# Patient Record
Sex: Male | Born: 1960 | Race: White | Hispanic: No | Marital: Married | Smoking: Former smoker
Health system: Southern US, Community
[De-identification: ages and names within clinical notes are randomized; demographics above are authoritative.]

## PROBLEM LIST (undated history)

## (undated) DIAGNOSIS — G5600 Carpal tunnel syndrome, unspecified upper limb: Secondary | ICD-10-CM

## (undated) DIAGNOSIS — K5790 Diverticulosis of intestine, part unspecified, without perforation or abscess without bleeding: Secondary | ICD-10-CM

## (undated) DIAGNOSIS — K5792 Diverticulitis of intestine, part unspecified, without perforation or abscess without bleeding: Secondary | ICD-10-CM

## (undated) DIAGNOSIS — T4145XA Adverse effect of unspecified anesthetic, initial encounter: Secondary | ICD-10-CM

## (undated) DIAGNOSIS — M4802 Spinal stenosis, cervical region: Secondary | ICD-10-CM

## (undated) DIAGNOSIS — K635 Polyp of colon: Secondary | ICD-10-CM

## (undated) DIAGNOSIS — F319 Bipolar disorder, unspecified: Secondary | ICD-10-CM

## (undated) DIAGNOSIS — G2581 Restless legs syndrome: Secondary | ICD-10-CM

## (undated) DIAGNOSIS — T8859XA Other complications of anesthesia, initial encounter: Secondary | ICD-10-CM

## (undated) DIAGNOSIS — R011 Cardiac murmur, unspecified: Secondary | ICD-10-CM

## (undated) DIAGNOSIS — M1990 Unspecified osteoarthritis, unspecified site: Secondary | ICD-10-CM

## (undated) DIAGNOSIS — I6529 Occlusion and stenosis of unspecified carotid artery: Secondary | ICD-10-CM

## (undated) DIAGNOSIS — E785 Hyperlipidemia, unspecified: Secondary | ICD-10-CM

## (undated) DIAGNOSIS — G4700 Insomnia, unspecified: Secondary | ICD-10-CM

## (undated) DIAGNOSIS — G4730 Sleep apnea, unspecified: Secondary | ICD-10-CM

## (undated) DIAGNOSIS — Z8719 Personal history of other diseases of the digestive system: Secondary | ICD-10-CM

## (undated) DIAGNOSIS — T7840XA Allergy, unspecified, initial encounter: Secondary | ICD-10-CM

## (undated) DIAGNOSIS — I493 Ventricular premature depolarization: Secondary | ICD-10-CM

## (undated) DIAGNOSIS — IMO0001 Reserved for inherently not codable concepts without codable children: Secondary | ICD-10-CM

## (undated) DIAGNOSIS — R200 Anesthesia of skin: Secondary | ICD-10-CM

## (undated) HISTORY — PX: SHOULDER SURGERY: SHX246

## (undated) HISTORY — DX: Hyperlipidemia, unspecified: E78.5

## (undated) HISTORY — DX: Polyp of colon: K63.5

## (undated) HISTORY — PX: OTHER SURGICAL HISTORY: SHX169

## (undated) HISTORY — DX: Carpal tunnel syndrome, unspecified upper limb: G56.00

## (undated) HISTORY — DX: Diverticulitis of intestine, part unspecified, without perforation or abscess without bleeding: K57.92

## (undated) HISTORY — DX: Unspecified osteoarthritis, unspecified site: M19.90

## (undated) HISTORY — DX: Cardiac murmur, unspecified: R01.1

## (undated) HISTORY — DX: Insomnia, unspecified: G47.00

## (undated) HISTORY — PX: COLON SURGERY: SHX602

## (undated) HISTORY — PX: JOINT REPLACEMENT: SHX530

## (undated) HISTORY — PX: COLONOSCOPY: SHX174

## (undated) HISTORY — DX: Reserved for inherently not codable concepts without codable children: IMO0001

---

## 1998-04-23 ENCOUNTER — Encounter: Payer: Self-pay | Admitting: Family Medicine

## 1998-04-23 ENCOUNTER — Ambulatory Visit (HOSPITAL_COMMUNITY): Admission: RE | Admit: 1998-04-23 | Discharge: 1998-04-23 | Payer: Self-pay | Admitting: Family Medicine

## 1999-01-13 ENCOUNTER — Emergency Department (HOSPITAL_COMMUNITY): Admission: EM | Admit: 1999-01-13 | Discharge: 1999-01-13 | Payer: Self-pay | Admitting: Emergency Medicine

## 1999-07-01 ENCOUNTER — Encounter: Payer: Self-pay | Admitting: Family Medicine

## 1999-07-01 ENCOUNTER — Ambulatory Visit (HOSPITAL_COMMUNITY): Admission: RE | Admit: 1999-07-01 | Discharge: 1999-07-01 | Payer: Self-pay | Admitting: Family Medicine

## 2002-04-29 ENCOUNTER — Emergency Department (HOSPITAL_COMMUNITY): Admission: EM | Admit: 2002-04-29 | Discharge: 2002-04-29 | Payer: Self-pay | Admitting: Emergency Medicine

## 2002-04-29 ENCOUNTER — Encounter: Payer: Self-pay | Admitting: Emergency Medicine

## 2002-05-03 ENCOUNTER — Inpatient Hospital Stay (HOSPITAL_COMMUNITY): Admission: EM | Admit: 2002-05-03 | Discharge: 2002-05-04 | Payer: Self-pay | Admitting: Emergency Medicine

## 2002-05-03 ENCOUNTER — Encounter: Payer: Self-pay | Admitting: Internal Medicine

## 2002-05-04 ENCOUNTER — Encounter: Payer: Self-pay | Admitting: Internal Medicine

## 2004-06-10 ENCOUNTER — Ambulatory Visit: Payer: Self-pay | Admitting: Internal Medicine

## 2004-06-28 ENCOUNTER — Ambulatory Visit: Payer: Self-pay | Admitting: Internal Medicine

## 2004-07-07 ENCOUNTER — Encounter: Payer: Self-pay | Admitting: Internal Medicine

## 2004-08-01 ENCOUNTER — Ambulatory Visit (HOSPITAL_BASED_OUTPATIENT_CLINIC_OR_DEPARTMENT_OTHER): Admission: RE | Admit: 2004-08-01 | Discharge: 2004-08-01 | Payer: Self-pay | Admitting: Internal Medicine

## 2004-08-01 ENCOUNTER — Encounter: Payer: Self-pay | Admitting: Internal Medicine

## 2004-08-06 ENCOUNTER — Ambulatory Visit: Payer: Self-pay | Admitting: Pulmonary Disease

## 2004-08-25 ENCOUNTER — Ambulatory Visit: Payer: Self-pay | Admitting: Internal Medicine

## 2004-09-20 ENCOUNTER — Ambulatory Visit: Payer: Self-pay | Admitting: Internal Medicine

## 2004-10-20 ENCOUNTER — Ambulatory Visit: Payer: Self-pay | Admitting: Internal Medicine

## 2004-10-28 ENCOUNTER — Ambulatory Visit: Payer: Self-pay | Admitting: Pulmonary Disease

## 2004-12-20 ENCOUNTER — Ambulatory Visit: Payer: Self-pay | Admitting: Internal Medicine

## 2005-06-29 ENCOUNTER — Ambulatory Visit: Payer: Self-pay | Admitting: Internal Medicine

## 2005-07-18 ENCOUNTER — Ambulatory Visit: Payer: Self-pay | Admitting: Internal Medicine

## 2005-12-20 ENCOUNTER — Ambulatory Visit: Payer: Self-pay | Admitting: Internal Medicine

## 2006-03-30 ENCOUNTER — Ambulatory Visit: Payer: Self-pay | Admitting: Internal Medicine

## 2006-03-30 LAB — CONVERTED CEMR LAB
AST: 20 units/L (ref 0–37)
Alkaline Phosphatase: 30 units/L — ABNORMAL LOW (ref 39–117)
Chloride: 106 meq/L (ref 96–112)
Cholesterol: 225 mg/dL (ref 0–200)
Creatinine, Ser: 1.3 mg/dL (ref 0.4–1.5)
Glomerular Filtration Rate, Af Am: 76 mL/min/{1.73_m2}
Glucose, Bld: 102 mg/dL — ABNORMAL HIGH (ref 70–99)
HDL: 34.7 mg/dL — ABNORMAL LOW (ref >39.0)
MCHC: 34.2 g/dL (ref 30.0–36.0)
MCV: 92.5 fL (ref 78.0–100.0)
PSA: 0.71 ng/mL (ref 0.10–4.00)
Sodium: 142 meq/L (ref 135–145)
Triglyceride fasting, serum: 118 mg/dL (ref 0–149)
VLDL: 24 mg/dL (ref 0–40)
WBC: 5.2 10*3/uL (ref 4.5–10.5)

## 2007-04-13 ENCOUNTER — Emergency Department (HOSPITAL_COMMUNITY): Admission: EM | Admit: 2007-04-13 | Discharge: 2007-04-13 | Payer: Self-pay | Admitting: Emergency Medicine

## 2007-04-16 ENCOUNTER — Ambulatory Visit: Payer: Self-pay | Admitting: Internal Medicine

## 2007-04-16 DIAGNOSIS — E785 Hyperlipidemia, unspecified: Secondary | ICD-10-CM

## 2007-05-04 ENCOUNTER — Ambulatory Visit: Payer: Self-pay | Admitting: Gastroenterology

## 2007-05-29 ENCOUNTER — Ambulatory Visit: Payer: Self-pay | Admitting: Gastroenterology

## 2007-05-29 ENCOUNTER — Encounter: Payer: Self-pay | Admitting: Internal Medicine

## 2007-05-29 ENCOUNTER — Encounter: Payer: Self-pay | Admitting: Gastroenterology

## 2007-07-07 DIAGNOSIS — F329 Major depressive disorder, single episode, unspecified: Secondary | ICD-10-CM | POA: Insufficient documentation

## 2007-10-15 ENCOUNTER — Telehealth (INDEPENDENT_AMBULATORY_CARE_PROVIDER_SITE_OTHER): Payer: Self-pay | Admitting: *Deleted

## 2007-10-16 ENCOUNTER — Ambulatory Visit: Payer: Self-pay | Admitting: Internal Medicine

## 2007-11-28 ENCOUNTER — Telehealth (INDEPENDENT_AMBULATORY_CARE_PROVIDER_SITE_OTHER): Payer: Self-pay | Admitting: *Deleted

## 2007-11-29 ENCOUNTER — Ambulatory Visit: Payer: Self-pay | Admitting: Internal Medicine

## 2007-11-29 DIAGNOSIS — Z862 Personal history of diseases of the blood and blood-forming organs and certain disorders involving the immune mechanism: Secondary | ICD-10-CM

## 2007-11-29 DIAGNOSIS — F319 Bipolar disorder, unspecified: Secondary | ICD-10-CM | POA: Insufficient documentation

## 2007-11-29 DIAGNOSIS — M1710 Unilateral primary osteoarthritis, unspecified knee: Secondary | ICD-10-CM | POA: Insufficient documentation

## 2007-11-29 DIAGNOSIS — IMO0002 Reserved for concepts with insufficient information to code with codable children: Secondary | ICD-10-CM

## 2007-11-29 DIAGNOSIS — Z8639 Personal history of other endocrine, nutritional and metabolic disease: Secondary | ICD-10-CM

## 2007-11-29 DIAGNOSIS — R229 Localized swelling, mass and lump, unspecified: Secondary | ICD-10-CM

## 2007-11-29 DIAGNOSIS — G4700 Insomnia, unspecified: Secondary | ICD-10-CM

## 2008-01-03 ENCOUNTER — Ambulatory Visit: Payer: Self-pay | Admitting: Internal Medicine

## 2008-01-07 LAB — CONVERTED CEMR LAB
AST: 26 units/L (ref 0–37)
BUN: 18 mg/dL (ref 6–23)
Basophils Absolute: 0 10*3/uL (ref 0.0–0.1)
Bilirubin, Direct: 0.1 mg/dL (ref 0.0–0.3)
CO2: 27 meq/L (ref 19–32)
Calcium: 9.1 mg/dL (ref 8.4–10.5)
Chloride: 105 meq/L (ref 96–112)
Creatinine, Ser: 1.2 mg/dL (ref 0.4–1.5)
Eosinophils Absolute: 0.1 10*3/uL (ref 0.0–0.7)
GFR calc non Af Amer: 69 mL/min
MCV: 93 fL (ref 78.0–100.0)
Monocytes Relative: 7.5 % (ref 3.0–12.0)
PSA: 0.83 ng/mL (ref 0.10–4.00)
Potassium: 4 meq/L (ref 3.5–5.1)
RBC: 4.75 M/uL (ref 4.22–5.81)
RDW: 12 % (ref 11.5–14.6)
Triglycerides: 147 mg/dL (ref 0–149)
VLDL: 29 mg/dL (ref 0–40)
WBC: 5.2 10*3/uL (ref 4.5–10.5)

## 2009-01-19 ENCOUNTER — Ambulatory Visit: Payer: Self-pay | Admitting: Internal Medicine

## 2009-01-19 ENCOUNTER — Ambulatory Visit: Payer: Self-pay | Admitting: Cardiology

## 2009-01-19 ENCOUNTER — Inpatient Hospital Stay (HOSPITAL_COMMUNITY): Admission: EM | Admit: 2009-01-19 | Discharge: 2009-01-20 | Payer: Self-pay | Admitting: Emergency Medicine

## 2009-01-20 ENCOUNTER — Encounter: Payer: Self-pay | Admitting: Cardiology

## 2009-01-23 ENCOUNTER — Telehealth: Payer: Self-pay | Admitting: Cardiology

## 2009-01-26 ENCOUNTER — Telehealth (INDEPENDENT_AMBULATORY_CARE_PROVIDER_SITE_OTHER): Payer: Self-pay | Admitting: *Deleted

## 2009-01-27 ENCOUNTER — Encounter: Payer: Self-pay | Admitting: Cardiovascular Disease

## 2009-01-27 ENCOUNTER — Ambulatory Visit: Payer: Self-pay

## 2009-01-29 ENCOUNTER — Telehealth: Payer: Self-pay | Admitting: Cardiology

## 2009-02-20 ENCOUNTER — Ambulatory Visit: Payer: Self-pay | Admitting: Internal Medicine

## 2009-02-20 DIAGNOSIS — E781 Pure hyperglyceridemia: Secondary | ICD-10-CM | POA: Insufficient documentation

## 2009-02-25 LAB — CONVERTED CEMR LAB
Direct LDL: 179 mg/dL
Total CHOL/HDL Ratio: 5
Triglycerides: 83 mg/dL (ref 0.0–149.0)

## 2009-05-22 ENCOUNTER — Ambulatory Visit: Payer: Self-pay | Admitting: Internal Medicine

## 2009-05-29 ENCOUNTER — Encounter (INDEPENDENT_AMBULATORY_CARE_PROVIDER_SITE_OTHER): Payer: Self-pay | Admitting: *Deleted

## 2009-05-29 LAB — CONVERTED CEMR LAB
AST: 37 units/L (ref 0–37)
Alkaline Phosphatase: 39 units/L (ref 39–117)
Bilirubin, Direct: 0.1 mg/dL (ref 0.0–0.3)

## 2010-01-09 ENCOUNTER — Emergency Department (HOSPITAL_COMMUNITY): Admission: EM | Admit: 2010-01-09 | Discharge: 2010-01-10 | Payer: Self-pay | Admitting: Emergency Medicine

## 2010-01-11 ENCOUNTER — Telehealth: Payer: Self-pay | Admitting: Gastroenterology

## 2010-01-12 ENCOUNTER — Ambulatory Visit: Payer: Self-pay | Admitting: Gastroenterology

## 2010-01-29 ENCOUNTER — Ambulatory Visit: Payer: Self-pay | Admitting: Internal Medicine

## 2010-02-05 LAB — CONVERTED CEMR LAB
ALT: 36 units/L (ref 0–53)
AST: 22 units/L (ref 0–37)
Albumin: 4.2 g/dL (ref 3.5–5.2)
Basophils Absolute: 0.1 10*3/uL (ref 0.0–0.1)
Calcium: 9.4 mg/dL (ref 8.4–10.5)
Chloride: 102 meq/L (ref 96–112)
Direct LDL: 176.6 mg/dL
Eosinophils Absolute: 0.1 10*3/uL (ref 0.0–0.7)
GFR calc non Af Amer: 88.94 mL/min (ref >60)
Glucose, Bld: 64 mg/dL — ABNORMAL LOW (ref 70–99)
HDL: 43.6 mg/dL (ref >39.00)
Lymphocytes Relative: 31 % (ref 12.0–46.0)
MCHC: 35.2 g/dL (ref 30.0–36.0)
Monocytes Absolute: 0.4 10*3/uL (ref 0.1–1.0)
Neutro Abs: 3.8 10*3/uL (ref 1.4–7.7)
Platelets: 231 10*3/uL (ref 150.0–400.0)
RBC: 4.65 M/uL (ref 4.22–5.81)
RDW: 13.2 % (ref 11.5–14.6)
Total Bilirubin: 0.9 mg/dL (ref 0.3–1.2)
Triglycerides: 153 mg/dL — ABNORMAL HIGH (ref 0.0–149.0)

## 2010-06-22 ENCOUNTER — Emergency Department (HOSPITAL_COMMUNITY)
Admission: EM | Admit: 2010-06-22 | Discharge: 2010-06-22 | Payer: Self-pay | Source: Home / Self Care | Admitting: Emergency Medicine

## 2010-06-22 ENCOUNTER — Telehealth: Payer: Self-pay | Admitting: Internal Medicine

## 2010-07-15 NOTE — Progress Notes (Signed)
Summary: triaeg   Phone Note Call from Patient Call back at [phone]   Caller: Spouse Robin Call For: Arlyce Dice Reason for Call: Talk to Nurse Summary of Call: Spouse would like to speak to nurse regarding her husband, states that he has fever and  lower left abd pain. Patient was in the ER this weekend and was told he needed to be seen this week with a his gi. Initial call taken by: Tawni Levy,  January 11, 2010 9:31 AM  Follow-up for Phone Call        Message left to call back.   Teryl Lucy RN  January 11, 2010 10:16 AM Wife ntfd. that pt. is to be seen tomorrow afrternoon by N.P as Dr.Amanada Philbrick is supervising dr. Follow-up by: Teryl Lucy RN,  January 11, 2010 10:30 AM

## 2010-07-15 NOTE — Assessment & Plan Note (Signed)
Summary: f/u er. for diverticulitis    History of Present Illness Primary GI MD: Melvia Heaps MD [facility] Primary Raelan Burgoon: Willow Ora, MD Requesting Matheau Orona: Willow Ora, MD Chief Complaint: Post ER diverticulitis flare up. Starting last Saturday morning pt had mild LLQ abd discomfort and by that night pt had severe LLQ abd pain. Pt also had a fever off and on of 100. Pt was started on ATB's and now has mild abd tenderness and a low grade fever this morning.  History of Present Illness:   Patient seen last in November 2008 for acute diverticulitis. He had been doing well except for occasional mild, intermittent LLQ pain. A few days go patient developed constipation followed by an escalation of LLQ discomfort into severe LLQ pain with associated fevers. Symptoms remniscent of diverticulitis in 2008. Went to ER, given Cipro and Flagyl. Feeling much better. Labs in ER were ine with WBC of 9.4, hemogloin 15.3. Acute abdomina series was negative. Yesterday fever down in 99 -100 degree range. Slept well.    GI Review of Systems    Reports abdominal pain.     Location of  Abdominal pain: LLQ.    Denies acid reflux, belching, bloating, chest pain, dysphagia with liquids, dysphagia with solids, heartburn, loss of appetite, nausea, vomiting, vomiting blood, weight loss, and  weight gain.      Reports constipation and  diverticulosis.     Denies anal fissure, black tarry stools, change in bowel habit, diarrhea, fecal incontinence, heme positive stool, hemorrhoids, irritable bowel syndrome, jaundice, light color stool, liver problems, rectal bleeding, and  rectal pain.    Current Medications (verified): 1)  Depakote Er 500 Mg  Tb24 (Divalproex Sodium) .... 3 By Mouth Qd 2)  Lamictal 100 Mg  Tabs (Lamotrigine) .... Qd 3)  Cipro 500 Mg Tabs (Ciprofloxacin Hcl) .... One Tablet By Mouth Two Times A Day 4)  Metronidazole 500 Mg Tabs (Metronidazole) .... One Tablet By Mouth Two Times A Day  Allergies  (verified): 1)  ! Hydrocodone  Past History:  Past Medical History: COLONIC POLYPS, ADENOMATOUS  & H/O DIVERTICULITIS HYPERLIPIDEMIA  Depression- Bipolar (Dr. Tomasa Rand) insomnia (Dr. Shelle Iron) heart murmur arthritis (rt knee) (dr zeminski) elevated liver enzymes Carpal Tunnel (dr sypher) CP 01-2009---Nl ECHO and stress test neg   Diverticulitis  Past Surgical History: Reviewed history from 11/29/2007 and no changes required. history of maxillofacial surgery history of right shoulder surgery twice due to a football injury (open surgery) right knee arthroscopy in 2007  Family History: Reviewed history from 11/29/2007 and no changes required. prostate Ca - PGF DM - no HTN - no Colon Ca - no MI - no F - ?SVT s/p ablation  Social History: Reviewed history from 05/22/2009 and no changes required. Married Never Smoked Alcohol use-yes, 1-2 beers a day 4 children Occupation: works in [employer] consultant) Exercise-- walking more recently  diet, has to eat out a lot   Review of Systems       The patient complains of fever.  The patient denies allergy/sinus, anemia, anxiety-new, arthritis/joint pain, back pain, blood in urine, breast changes/lumps, change in vision, confusion, cough, coughing up blood, depression-new, fainting, fatigue, headaches-new, hearing problems, heart murmur, heart rhythm changes, itching, menstrual pain, muscle pains/cramps, night sweats, nosebleeds, pregnancy symptoms, shortness of breath, skin rash, sleeping problems, sore throat, swelling of feet/legs, swollen lymph glands, thirst - excessive , urination - excessive , urination changes/pain, urine leakage, vision changes, and voice change.    Vital Signs:  Patient profile:  50 year old male Height:      69 inches Weight:      244.13 pounds BMI:     36.18 Temp:     98.2 degrees F oral Pulse rate:   70 / minute Pulse rhythm:   regular BP sitting:   116 / 68  (right arm) Cuff size:    regular  Vitals Entered By: Christie Nottingham CMA Duncan Dull) (January 12, 2010 1:55 PM)  Physical Exam  General:  Well developed, well nourished, no acute distress. Head:  Normocephalic and atraumatic. Eyes:  Conjunctiva pink, no icterus.  Neck:  no obvious masses  Lungs:  Clear throughout to auscultation. Heart:  Regular rate and rhythm; no murmurs, rubs,  or bruits. Abdomen:  Abdomen soft,  nondistended. Moderately tender in LLQ. No obvious masses or hepatomegaly.Normal bowel sounds.  Msk:  Symmetrical with no gross deformities. Normal posture. Extremities:  No palmar erythema, no edema.  Neurologic:  Alert and  oriented x4;  grossly normal neurologically. Skin:  Intact without significant lesions or rashes. Cervical Nodes:  No significant cervical adenopathy. Psych:  Alert and cooperative. Normal mood and affect.   Impression & Recommendations:  Problem # 1:  DIVERTICULITIS OF COLON (ICD-562.11) Assessment Deteriorated Suspect recurrent sigmoid diverticulitis. Labs in ER were unremarkable. Improving on Cipro / Flagyl received in ER but LLQ still quite tender. ER gave patient #7 days of antibiotics. Will add three addiional days of treatment. Low residue diet until antibiotics completed and feeling better.   Call office ASAP for worsening pain and / or fevers.   Problem # 2:  COLONIC POLYPS, ADENOMATOUS (ICD-211.3) Assessment: Comment Only Removal of two tubular adenomas in Dec 2008.  Problem # 3:  BIPOLAR DISORDER UNSPECIFIED (ICD-296.80) Assessment: Comment Only  Patient Instructions: 1)  We sent prescription for Cipro and Metronidazole, 500 MG take 1 tab twice daily for 3 days, both medications. 2)  Low fiber diet brochure given. Stay on this diet until you complete the medications. 3)  Call us if you develop a fever or do feel any better.  4)  Copy sent to :  Willow Ora, MD 5)  The medication list was reviewed and reconciled.  All changed / newly prescribed medications were  explained.  A complete medication list was provided to the patient / caregiver. Prescriptions: METRONIDAZOLE 500 MG TABS (METRONIDAZOLE) Take 1 tab twice daily x 3 days  #6 x 0   Entered by:   Lowry Ram NCMA   Authorized by:   Willette Cluster NP   Signed by:   Lowry Ram NCMA on 01/12/2010   Method used:   Electronically to        [facility]  [phone]* (retail)       [address] [place], [place]  [address]       Ph: [phone] or [phone]       Fax: [phone]   RxID:   [id number] CIPRO 500 MG TABS (CIPROFLOXACIN HCL) Take 1 tab twice daily x 3 days  #6 x 0   Entered by:   Lowry Ram NCMA   Authorized by:   Willette Cluster NP   Signed by:   Lowry Ram NCMA on 01/12/2010   Method used:   Electronically to        [facility]  [phone]* (retail)       [address] [place], [place]  [address]  Ph: [phone] or [phone]       Fax: [phone]   RxID:   [id number]
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
## 2010-07-15 NOTE — Assessment & Plan Note (Signed)
Summary: CPX/NS/KDC/rescd/cbs   Vital Signs:  Patient profile:   50 year old male Height:      69 inches Weight:      248 pounds Pulse rate:   79 / minute Pulse rhythm:   regular BP sitting:   128 / 84  (left arm) Cuff size:   regular  Vitals Entered By: Army Fossa CMA (January 29, 2010 1:24 PM) CC: CPX: fasting  Comments c/o being tired    History of Present Illness: complete physical exam His only complaint today is fatigue, he works long hours in front of the computer, by the time he comes back home he has no energy. See review of systems Recent episodes of diverticulitis, saw  GI, improving  Preventive Screening-Counseling & Management  Alcohol-Tobacco     Alcohol type: wine     Smoking Status: current     Cans of tobacco/week: yes  Caffeine-Diet-Exercise     Does Patient Exercise: no  Current Medications (verified): 1)  Depakote Er 500 Mg  Tb24 (Divalproex Sodium) .... 3 By Mouth Qd 2)  Lamictal 100 Mg  Tabs (Lamotrigine) .... Qd  Allergies: 1)  ! Hydrocodone  Past History:  Past Medical History: Reviewed history from 01/12/2010 and no changes required. COLONIC POLYPS, ADENOMATOUS  & H/O DIVERTICULITIS HYPERLIPIDEMIA  Depression- Bipolar (Dr. Tomasa Rand) insomnia (Dr. Shelle Iron) heart murmur arthritis (rt knee) (dr zeminski) elevated liver enzymes Carpal Tunnel (dr sypher) CP 01-2009---Nl ECHO and stress test neg   Diverticulitis  Past Surgical History: Reviewed history from 11/29/2007 and no changes required. 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 no orthopnea . Resp:  Denies cough, shortness of breath, and wheezing; ++ snoring , had sleep study , no apnea . GI:  Denies bloody stools, diarrhea, nausea, and vomiting. GU:  Denies dysuria; change in the color of the urine while on antibiotics for diverticulitis , ?blood . Psych:  Denies anxiety and depression; sees psych, no depression, mood flat . Endo:  no problems with erections No problems with libido.  Physical Exam  General:  alert, well-developed, and overweight-appearing.   Neck:  no masses, no thyromegaly, and normal carotid upstroke.   Lungs:  normal respiratory effort, no intercostal retractions, no accessory muscle use, and normal breath sounds.   Heart:  normal rate, regular rhythm, and no murmur.   Abdomen:  soft, no distention, no masses, no guarding, no rigidity, and no rebound tenderness.  slightly tender at the left lower quadrant Rectal:  No external abnormalities noted. Normal sphincter tone. No rectal masses or tenderness. Prostate:  Prostate gland firm and smooth, no enlargement, nodularity, tenderness, mass, asymmetry or induration. Extremities:  no lower extremity edema Psych:  Oriented X3, memory intact for recent and remote, normally interactive, good eye contact, not anxious appearing, and not depressed appearing.     Impression & Recommendations:  Problem # 1:  HEALTH SCREENING (ICD-V70.0) Td 2001 and today  Cscope 12-08,  tics and polyps   Complains of fatigue, ROS is essentially negative, will see how his labs came back fatigue related to medication?. Recommend to discuss with psychiatry  diet-exercise discussed   Orders: Venipuncture (01027) TLB-BMP (Basic Metabolic Panel-BMET) (80048-METABOL) TLB-CBC Platelet - w/Differential (85025-CBCD) TLB-Hepatic/Liver Function Pnl (80076-HEPATIC) TLB-Lipid Panel  (80061-LIPID) TLB-TSH (Thyroid Stimulating Hormone) (84443-TSH) TLB-PSA (Prostate Specific Antigen) (84153-PSA) TLB-B12 + Folate Pnl (25366_44034-V42/VZD) Specimen Handling (63875)  Problem # 2:  HYPERTRIGLYCERIDEMIA (ICD-272.1) due for labs His LFTs were slightly elevated, they got better after we discontinued TriCor.  Labs Reviewed: SGOT: 37 (05/22/2009)   SGPT: 72 (05/22/2009)   HDL:46.80 (02/20/2009), 36.2 (01/03/2008)  LDL:DEL (01/03/2008), DEL (03/30/2006)  Chol:230 (02/20/2009), 247 (01/03/2008)  Trig:83.0 (02/20/2009), 147 (01/03/2008)  Complete Medication List: 1)  Depakote Er 500 Mg Tb24 (Divalproex sodium) .... 3 by mouth qd 2)  Lamictal 100 Mg Tabs (Lamotrigine) .... Qd  Other Orders: Tdap => 44yrs IM (64332) Admin 1st Vaccine (95188)  Patient Instructions: 1)  Please schedule a follow-up appointment in 6 months to talk about fatigue   Risk Factors:  Tobacco use:  current    Smokeless:  Yes    Type:  wine Exercise:  no  Colonoscopy History:     Date of Last Colonoscopy:  06/14/2007    Results:  abnormal- 2 polpys removed per pt (1 being pre cancerious)     Immunizations Administered:  Tetanus Vaccine:    Vaccine Type: Tdap    Site: right deltoid    Mfr: GlaxoSmithKline    Dose: 0.5 ml    Route: IM    Given by: Army Fossa CMA    Exp. Date: 03/03/2012    Lot #: [id number]    Preventive Care Screening  Last Tetanus Booster:    Date:  01/29/2010    Results:  Tdap  Colonoscopy:    Date:  06/14/2007    Results:  abnormal- 2 polpys removed per pt (1 being pre cancerious)

## 2010-08-06 ENCOUNTER — Ambulatory Visit (INDEPENDENT_AMBULATORY_CARE_PROVIDER_SITE_OTHER): Payer: 59 | Admitting: Internal Medicine

## 2010-08-06 ENCOUNTER — Encounter: Payer: Self-pay | Admitting: Internal Medicine

## 2010-08-06 DIAGNOSIS — B351 Tinea unguium: Secondary | ICD-10-CM | POA: Insufficient documentation

## 2010-08-06 DIAGNOSIS — R229 Localized swelling, mass and lump, unspecified: Secondary | ICD-10-CM

## 2010-08-06 DIAGNOSIS — E781 Pure hyperglyceridemia: Secondary | ICD-10-CM

## 2010-08-07 ENCOUNTER — Encounter: Payer: Self-pay | Admitting: Internal Medicine
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
## 2010-08-28 LAB — URINE CULTURE: Colony Count: NO GROWTH

## 2010-08-28 LAB — DIFFERENTIAL
Basophils Absolute: 0.1 10*3/uL (ref 0.0–0.1)
Eosinophils Relative: 1 % (ref 0–5)
Lymphocytes Relative: 22 % (ref 12–46)
Lymphs Abs: 2.1 10*3/uL (ref 0.7–4.0)
Monocytes Absolute: 0.7 10*3/uL (ref 0.1–1.0)
Neutrophils Relative %: 69 % (ref 43–77)

## 2010-08-28 LAB — URINALYSIS, ROUTINE W REFLEX MICROSCOPIC
Ketones, ur: NEGATIVE mg/dL
Nitrite: NEGATIVE
Urobilinogen, UA: 0.2 mg/dL (ref 0.0–1.0)

## 2010-08-28 LAB — POCT I-STAT, CHEM 8
Calcium, Ion: 1.14 mmol/L (ref 1.12–1.32)
Creatinine, Ser: 1.1 mg/dL (ref 0.4–1.5)
Glucose, Bld: 81 mg/dL (ref 70–99)
Potassium: 3.7 mEq/L (ref 3.5–5.1)
Sodium: 139 mEq/L (ref 135–145)

## 2010-08-28 LAB — CBC
Hemoglobin: 15 g/dL (ref 13.0–17.0)
MCHC: 35.6 g/dL (ref 30.0–36.0)

## 2010-08-28 LAB — LACTIC ACID, PLASMA: Lactic Acid, Venous: 1.2 mmol/L (ref 0.5–2.2)

## 2010-09-10 LAB — CULTURE, FUNGUS WITHOUT SMEAR

## 2010-09-15 ENCOUNTER — Telehealth: Payer: Self-pay | Admitting: Internal Medicine

## 2010-09-15 NOTE — Telephone Encounter (Signed)
Advise patient: Culture did confirm onychomycoses. Recommend to start Lamisil 250 mg one daily for 12 weeks I checked the computer, there is no interaction between Lamisil and his current medications. He needs to come back in 4 weeks to check LFTs to be sure the medication is not affecting him. Please arrange. [facility]

## 2010-09-15 NOTE — Telephone Encounter (Signed)
Message left for patient to return my call.  

## 2010-09-16 MED ORDER — TERBINAFINE HCL 250 MG PO TABS: 250.0000 mg | ORAL_TABLET | Freq: Every day | ORAL | Status: DC

## 2010-09-16 NOTE — Telephone Encounter (Signed)
Called pts cell, VM full. Left message home number.

## 2010-09-16 NOTE — Telephone Encounter (Signed)
I spoke w/ pt he is aware, will call to set up labs.

## 2010-09-19 LAB — COMPREHENSIVE METABOLIC PANEL
BUN: 14 mg/dL (ref 6–23)
CO2: 26 mEq/L (ref 19–32)
Chloride: 103 mEq/L (ref 96–112)
Creatinine, Ser: 1.06 mg/dL (ref 0.4–1.5)
GFR calc non Af Amer: >60 mL/min (ref >60)
Total Bilirubin: 0.6 mg/dL (ref 0.3–1.2)

## 2010-09-19 LAB — DIFFERENTIAL
Basophils Absolute: 0 10*3/uL (ref 0.0–0.1)
Basophils Relative: 1 % (ref 0–1)
Eosinophils Absolute: 0.1 10*3/uL (ref 0.0–0.7)
Lymphocytes Relative: 37 % (ref 12–46)
Lymphs Abs: 1.8 10*3/uL (ref 0.7–4.0)
Monocytes Absolute: 0.3 10*3/uL (ref 0.1–1.0)

## 2010-09-19 LAB — HEMOGLOBIN A1C
Hgb A1c MFr Bld: 5.6 % (ref 4.6–6.1)
Mean Plasma Glucose: 114 mg/dL

## 2010-09-19 LAB — CBC
HCT: 42.7 % (ref 39.0–52.0)
MCHC: 34.9 g/dL (ref 30.0–36.0)
MCV: 91.3 fL (ref 78.0–100.0)
Platelets: 191 10*3/uL (ref 150–400)
RBC: 4.67 MIL/uL (ref 4.22–5.81)
WBC: 4.9 10*3/uL (ref 4.0–10.5)
WBC: 6.3 10*3/uL (ref 4.0–10.5)

## 2010-09-19 LAB — CK TOTAL AND CKMB (NOT AT ARMC)
CK, MB: 1.9 ng/mL (ref 0.3–4.0)
Relative Index: 1.5 (ref 0.0–2.5)
Total CK: 121 U/L (ref 7–232)
Total CK: 127 U/L (ref 7–232)

## 2010-09-19 LAB — LIPID PANEL
Cholesterol: 217 mg/dL — ABNORMAL HIGH (ref 0–200)
Triglycerides: 559 mg/dL — ABNORMAL HIGH (ref <150)

## 2010-09-19 LAB — BASIC METABOLIC PANEL
BUN: 12 mg/dL (ref 6–23)
CO2: 24 mEq/L (ref 19–32)
GFR calc Af Amer: >60 mL/min (ref >60)
GFR calc non Af Amer: >60 mL/min (ref >60)
Glucose, Bld: 106 mg/dL — ABNORMAL HIGH (ref 70–99)
Potassium: 4.1 mEq/L (ref 3.5–5.1)

## 2010-09-19 LAB — TROPONIN I: Troponin I: 0.01 ng/mL (ref 0.00–0.06)

## 2010-09-19 LAB — CARDIAC PANEL(CRET KIN+CKTOT+MB+TROPI)
CK, MB: 1.6 ng/mL (ref 0.3–4.0)
Relative Index: 1.4 (ref 0.0–2.5)
Troponin I: 0.02 ng/mL (ref 0.00–0.06)
Troponin I: 0.03 ng/mL (ref 0.00–0.06)

## 2010-09-19 LAB — BRAIN NATRIURETIC PEPTIDE: Pro B Natriuretic peptide (BNP): <30 pg/mL (ref 0.0–100.0)

## 2010-09-19 LAB — D-DIMER, QUANTITATIVE: D-Dimer, Quant: <0.22 ug/mL-FEU (ref 0.00–0.48)

## 2010-09-23 ENCOUNTER — Ambulatory Visit (HOSPITAL_BASED_OUTPATIENT_CLINIC_OR_DEPARTMENT_OTHER)
Admission: RE | Admit: 2010-09-23 | Discharge: 2010-09-23 | Disposition: A | Discharge status: Home / Self Care | Payer: 59 | Source: Ambulatory Visit | Attending: General Surgery | Admitting: General Surgery

## 2010-09-23 ENCOUNTER — Other Ambulatory Visit: Payer: Self-pay | Admitting: General Surgery

## 2010-09-23 DIAGNOSIS — D1739 Benign lipomatous neoplasm of skin and subcutaneous tissue of other sites: Secondary | ICD-10-CM | POA: Insufficient documentation

## 2010-09-28 NOTE — Op Note (Signed)
  NAME:  HILMAN, KISSLING NO.:  [id number]  MEDICAL RECORD NO.:  [id number]           PATIENT TYPE:  LOCATION:                                 FACILITY:  PHYSICIAN:  Almond Lint, MD       DATE OF BIRTH:  13-Oct-1960  DATE OF PROCEDURE:  09/23/2010 DATE OF DISCHARGE:                              OPERATIVE REPORT   PREOPERATIVE DIAGNOSIS:  Left lateral abdominal wall mass.  POSTOPERATIVE DIAGNOSIS:  Left lateral abdominal wall mass.  PROCEDURE PERFORMED:  Excision of subcutaneous abdominal wall mass 4 cm.  ANESTHESIA:  Local.  SURGEON:  Almond Lint, MD  ESTIMATED BLOOD LOSS:  Minimal.  COMPLICATIONS:  None known.  SPECIMEN:  Left lateral abdominal wall mass.  FINDINGS:  Fatty-appearing tumor of the left lateral abdominal wall 4 x 2.5 x 1.5 cm.  PROCEDURE:  Mr. Litzinger was identified in the holding area and taken to the operating room where he was placed supine on the operating room table.  His left lateral abdominal wall was prepped and draped in standard fashion.  The area over the mass was infiltrated with local anesthetic.  A transverse incision was made over the mass with a #15 blade going all the way through the dermis.  Skin hooks were used to elevate the skin and the mass was dissected around with scissors.  It was then grasped with an Allis and the posterior attachments were taken with scissors as well.  Once the primary mass was removed, there were several lobules seen that were also taken.  The area was then inspected for hemostasis which was performed with the cautery.  The skin was then reapproximated with interrupted 3-0 Vicryl deep dermal sutures and then with 4-0 Monocryl running subcuticular suture.  The wound was then cleaned, dried, and dressed with Dermabond.  The patient tolerated the procedure well.  Needle, sponge, and instrument counts were correct.     Almond Lint, MD    FB/MEDQ  D:  09/23/2010  T:  09/24/2010  Job:   [id number]  Electronically Signed by Almond Lint MD on 09/28/2010 09:41:48 PM

## 2010-10-15 ENCOUNTER — Other Ambulatory Visit: Payer: 59

## 2010-10-22 ENCOUNTER — Other Ambulatory Visit: Payer: 59

## 2010-10-26 NOTE — H&P (Signed)
NAME:  Harry Carroll, Harry Carroll NO.:  [id number]   MEDICAL RECORD NO.:  [id number]          PATIENT TYPE:  EMS   LOCATION:  ED                           FACILITY:  [facility]   PHYSICIAN:  Marca Ancona, MD      DATE OF BIRTH:  03-26-1961   DATE OF ADMISSION:  01/19/2009  DATE OF DISCHARGE:                              HISTORY & PHYSICAL   PRIMARY CARE PHYSICIAN:  Willow Ora, MD.   HISTORY OF PRESENT ILLNESS:  This is a 50 year old with a history of  hyperlipidemia and bipolar disorder, who presents with chest pain.  He  has noted chest pain for 3 months.  He has it almost every morning.  It  will be a shooting/tight pain in the center of his chest when he wakes  up.  It will last 1 to 2 hours then resolve and has been getting  steadily worse.  He also notes the same pain when he is upset or under  stress.  This morning, he had the same pain, but it seemed worse so he  came to the emergency department.  It has lingered and is present very  mildly now.  He has never had any pain with exertion.  He had similar  symptoms back in 2003 and had a negative Myoview at that time.  He does  not get any formal exercise; however, he does state that he is mildly  short of breath after he climbs a flight of steps.   ALLERGIES:  No known drug allergies.   MEDICATIONS:  Lamictal and Depakote.  He is unsure about the doses.  He  does not take aspirin at home.   PAST MEDICAL HISTORY:  1. Hyperlipidemia.  2. Bipolar disorder.  3. History of knee surgery.  4. Myoview in November 2003, no ischemia or infarction, EF of 64%.      This was done because of atypical chest pain.  5. Bilateral osteoarthritis in the knees.   SOCIAL HISTORY:  The patient lives with his wife.  He is a Scientific laboratory technician.  He does not smoke cigarettes, however, he had does chew  tobacco.  He drinks alcohol occasionally.   FAMILY HISTORY:  The patient's father had an EP ablation, sounds like it  was either atrial  flutter or AVNRT.  His siblings do not have any known  coronary disease and his mother did not have known coronary disease.   REVIEW OF SYSTEMS:  All systems were reviewed and were negative except  as noted in the History of Present Illness.   RADIOLOGY:  Chest x-ray is clear.  EKG shows normal sinus rhythm, left  axis deviation, left ventricular hypertrophy, and poor anterior R-wave  progression.   LABORATORY DATA:  White count 4.9, hematocrit 46.3, platelets 191.  Potassium 4.1, creatinine 1.10.  D-dimer negative.  Cardiac enzymes  negative x2.   PHYSICAL EXAMINATION:  VITAL SIGNS:  Temperature 98.2, pulse 88 and  regular, blood pressure 139/80, saturation 96% on room air.  GENERAL:  He is a well-developed male in no apparent distress.  HEENT:  Normocephalic.  NECK:  Supple without lymphadenopathy, thyromegaly, or thyroid nodule.  JVP appears to be maybe mildly elevated at about 8 to 9 cm of water.  ABDOMEN:  Soft and nontender, normal bowel sounds, and no  hepatosplenomegaly.  CARDIOVASCULAR:  Heart regular S1 S2, no S3 S4.  There is no murmur.  There are 2+ posterior tibial pulses bilaterally.  There is no edema.  There is no carotid bruit.  LUNGS:  Clear to auscultation bilaterally.  EXTREMITIES:  No clubbing or cyanosis.  MUSCULOSKELETAL:  Normal exam.  SKIN:  Normal exam.  NEUROLOGIC:  Alert and oriented x3, normal affect.   ASSESSMENT/PLAN:  This is a 50 year old with a history of  hyperlipidemia, who presents with atypical chest pain.  1. Chest pain.  The patient's chest pain is quite atypical.  His risk      factors include hyperlipidemia.  His first 2 sets cardiac enzymes      are negative, however, they have only been a couple of hours apart.      We will plan on having him on aspirin, checking lipids in the      morning, and the cycling cardiac enzymes.  We will follow his blood      pressure as it may be that he is hypertensive.  If his enzymes      return  positive, we will start him on heparin and do a left heart      catheterization.  If his enzymes are negative, we will plan on      doing an outpatient Myoview.  2. Volume status.  The jugular venous pressure seems to be mildly      elevated.  We will check a BNP and an echocardiogram.      Marca Ancona, MD  Electronically Signed     DM/MEDQ  D:  01/19/2009  T:  01/19/2009  Job:  [id number]   cc:   Willow Ora, MD  [phone] [address] [place], [place] [address]

## 2010-10-26 NOTE — Discharge Summary (Signed)
NAMECOBY, SHREWSBERRY NO.:  [id number]   MEDICAL RECORD NO.:  [id number]          PATIENT TYPE:  INP   LOCATION:  1441                         FACILITY:  [facility]   PHYSICIAN:  Marca Ancona, MD      DATE OF BIRTH:  06-04-1961   DATE OF ADMISSION:  01/19/2009  DATE OF DISCHARGE:  01/20/2009                               DISCHARGE SUMMARY   CARDIOLOGIST:  Dr. Shirlee Latch.   PRIMARY CARE Dorinne Graeff:  Dr. Drue Novel.   DISCHARGE DIAGNOSIS:  Chest pain.   SECONDARY DIAGNOSES:  1. Hypertriglyceridemia/hyperlipidemia.  2. History of bipolar disorder.  3. Status post knee surgery and bilateral osteoarthritis 1998.   ALLERGIES:  No known drug allergies.   PROCEDURES:  A 2-D echocardiogram performed January 20, 2009 revealing EF  of 60% mild LVH.  No regional wall motion abnormalities.   HISTORY OF PRESENT ILLNESS:  A 50 year old male without prior cardiac  history.  He has had intermittent chest discomfort over the past 3  months, mostly in the mornings.  Pain is described as shooting in type  pain in the center chest normally when wakes up, lasting 1-2 hours  resolving spontaneously.  On the morning of admission he had recurrent  chest discomfort but this time it was worse than usual.  [facility]ed to  the [facility] Department.  There ECG showed no acute changes  and point of care cardiac markers were negative.  Admitted for  evaluation.   HOSPITAL COURSE:  The patient subsequently ruled out for MI.  He had no  further chest pain.  The 2-D echocardiogram was performed this morning  showing normal LV function without regional wall motion abnormalities.  EF was measured at 60%.  We have noted elevated lipids and ALT.  Triglycerides with total cholesterol 217, triglycerides 559, HDL 25 and  LDL not calculated.  We will initiate Tricor therapy at time of  discharge.   DISCHARGE LABS:  Hemoglobin 14.9, hematocrit 42.7, WBC 6.3, platelets  190,000.  Sodium 137, potassium  3.8, chloride 103, CO2 26, BUN 14,  creatinine 1.06, glucose 163, total bilirubin 0.6, alkaline phosphatase  35, AST 30, ALT 52, total protein 5.9, albumin 3.4, calcium 0.9,  hemoglobin A1c 5.5, CK 88, MB 1.7, troponin I 0.02, total cholesterol  270, triglycerides 559, HDL 25, LDL not calculated.   DISPOSITION:  The patient is discharged home today in good condition.   FOLLOW UP APPOINTMENTS:  We have arranged for follow up outpatient  exercise Myoview in our office on Tuesday August 17 at 9 a.m.  He is to  follow with Dr. Drue Novel within the next 2 weeks.   DISCHARGE MEDICATIONS:  1. Aspirin 81 mg daily.  2. Tricor 145 mg daily.  3. Lamictal.  4. Depakote as previously prescribed.  Patient unsure of home doses.   STUDIES:  None.   DURATION DISCHARGE ENCOUNTER:  35 minutes including physician time.      Nicolasa Ducking, ANP      Marca Ancona, MD  Electronically Signed    CB/MEDQ  D:  01/20/2009  T:  01/20/2009  Job:  [id number]   cc:   Willow Ora, MD  [phone] [address] [place], [place] [address]

## 2010-10-26 NOTE — Assessment & Plan Note (Signed)
[facility]                         GASTROENTEROLOGY OFFICE NOTE   BETH, SPACKMAN                       MRN:          [id number]  DATE:05/04/2007                            DOB:          08-26-1960    REASON FOR CONSULTATION:  Diverticulitis.   REASON:  Mr. Rockers is a pleasant 50 year old white male referred through  the courtesy of Dr. Drue Novel for evaluation.  Approximately 3 weeks ago he  developed severe right lower quadrant pain.  CT scan demonstrated  mesenteric stranding and inflammation in the area of the sigmoid colon  consistent with acute diverticulitis.  He was treated with antibiotics  with resolution of his pain.  He had a similar episode of pain in the  left lower quadrant approximately 10 years ago also diagnosed as  diverticulitis.  He generally moves his bowels regularly.  There is no  history of melena or hematochezia.   PAST MEDICAL HISTORY:  Pertinent for arthritis and depression.   FAMILY HISTORY:  Noncontributory.   MEDICATIONS:  Depakote, Lamictal and Celebrex.   HE IS ALLERGIC TO HYDROCODONE (ITCHING).   He does not smoke or drink but he does occasionally chew tobacco.  He is  married and works with Production manager.   REVIEW OF SYSTEMS:  Positive for urinary frequency and joint pain and  fatigue.   PHYSICAL EXAMINATION:  Pulse 80, blood pressure 106/64, weight 251.  HEENT:  EOMI.  PERRLA.  Sclerae are anicteric.  Conjunctivae are pink.  NECK:  Supple without thyromegaly, adenopathy or carotid bruits.  CHEST:  Clear to auscultation and percussion without adventitious  sounds.  CARDIAC:  Regular rhythm; normal S1 S2.  There are no murmurs, gallops  or rubs.  ABDOMEN:  Bowel sounds are normoactive.  Abdomen is soft, nontender and  nondistended.  There are no abdominal masses, tenderness, splenic  enlargement or hepatomegaly.  EXTREMITIES:  Full range of motion.  No cyanosis, clubbing or edema.  RECTAL:   Deferred.   IMPRESSION:  History of acute diverticulitis - clinically resolved.   RECOMMENDATION:  Colonoscopy at some point in the near future to help  determine severity and extent of his diverticular disease and for  colorectal cancer screening.     Barbette Hair. Arlyce Dice, MD,FACG  Electronically Signed    RDK/MedQ  DD: 05/04/2007  DT: 05/04/2007  Job #: [id number]   cc:   Willow Ora, MD

## 2010-10-26 NOTE — Letter (Signed)
May 04, 2007    Willow Ora, MD  [phone] [place], [place] [address]   RE:  Harry Carroll, Harry Carroll  MRN:  [id number]  /  DOB:  [birth year]   Dear Dr. Drue Novel:   Upon your kind referral, I had the pleasure of evaluating your patient  and I am pleased to offer my findings.  I saw Mr. Magallon in the office  today.  Enclosed is a copy of my progress note that details my findings  and recommendations.   Thank you for the opportunity to participate in your patient's care.    Sincerely,      Barbette Hair. Arlyce Dice, MD,FACG  Electronically Signed    RDK/MedQ  DD: 05/04/2007  DT: 05/04/2007  Job #: [id number]

## 2010-10-26 NOTE — Letter (Signed)
May 04, 2007    Harry Carroll   RE:  [name], [name]  MRN:  [id number]  /  DOB:  12/27/1960   Dear [name]:   It is my pleasure to have treated you recently as a new patient in my  office.  I appreciate your confidence and the opportunity to participate  in your care.   Since I do have a busy inpatient endoscopy schedule and office schedule,  my office hours vary weekly.  I am, however, available for emergency  calls every day through my office.  If I cannot promptly meet an urgent  office appointment, another one of our gastroenterologists will be able  to assist you.   My well-trained staff are prepared to help you at all times.  For  emergencies after office hours, a physician from our gastroenterology  section is always available through my 24-hour answering service.   While you are under my care, I encourage discussion of your questions  and concerns, and I will be happy to return your calls as soon as I am  available.   Once again, I welcome you as a new patient and I look forward to a happy  and healthy relationship.    Sincerely,      Barbette Hair. Arlyce Dice, MD,FACG  Electronically Signed   RDK/MedQ  DD: 05/04/2007  DT: 05/04/2007  Job #: [id number]

## 2010-10-28 ENCOUNTER — Other Ambulatory Visit (INDEPENDENT_AMBULATORY_CARE_PROVIDER_SITE_OTHER): Payer: 59

## 2010-10-28 DIAGNOSIS — B351 Tinea unguium: Secondary | ICD-10-CM

## 2010-10-28 LAB — HEPATIC FUNCTION PANEL
ALT: 39 U/L (ref 0–53)
AST: 25 U/L (ref 0–37)
Alkaline Phosphatase: 30 U/L — ABNORMAL LOW (ref 39–117)
Bilirubin, Direct: 0.1 mg/dL (ref 0.0–0.3)
Total Protein: 6.6 g/dL (ref 6.0–8.3)

## 2010-10-29 NOTE — Procedures (Signed)
NAME:  GREYSON, PEAVY NO.:  [id number]   MEDICAL RECORD NO.:  [id number]          PATIENT TYPE:  OUT   LOCATION:  SLEEP CENTER                 FACILITY:  [facility]   PHYSICIAN:  Marcelyn Bruins, M.D. [facility] DATE OF BIRTH:  18-Dec-1960   DATE OF STUDY:  08/01/2004                              NOCTURNAL POLYSOMNOGRAM   DATE OF STUDY:  August 01, 2004   REFERRING PHYSICIAN:  Dr. Willow Ora   INDICATION FOR STUDY:  Hypersomnia with sleep apnea. Epworth score is 17.   SLEEP ARCHITECTURE:  The patient had a total sleep time of 382 minutes with  sleep efficiency of 81%, there was decreased REM and slow wave sleep. Sleep  onset latency was prolonged at 40  minutes and REM latency was normal.   IMPRESSION:  1.  Small numbers of obstructive events which do not meet the respiratory      disturbance index criteria for the obstructive sleep apnea syndrome. The      patient had minimal oxygen desaturation to 89% during the small numbers      of events.  2.  Moderate snoring noted throughout the study.  3.  No clinically significant cardiac arrhythmias.  4.  Very large numbers of leg jerks with significant sleep disruption.      Clinical correlation is suggested. I suspect this is the etiology for      his sleep disruption and daytime symptoms.      KC/MEDQ  D:  08/06/2004 12:07:40  T:  08/07/2004 18:12:45  Job:  [id number]

## 2010-10-29 NOTE — H&P (Signed)
NAME:  Harry Carroll, Harry Carroll                          ACCOUNT NO.:  [id number]   MEDICAL RECORD NO.:  [id number]                   PATIENT TYPE:  INP   LOCATION:  3702                                 FACILITY:  [facility]   PHYSICIAN:  Salvadore Farber, M.D. [facility]         DATE OF BIRTH:  05/09/1961   DATE OF ADMISSION:  05/03/2002  DATE OF DISCHARGE:                                HISTORY & PHYSICAL   CHIEF COMPLAINT:  Chest pain.   HISTORY OF PRESENT ILLNESS:  The patient is a 50 year old gentleman without  history of coronary artery disease who presents with chest pain.  Over the  past three weeks, he has been under a great deal of stress at work.  Over  that same time, he has had waxing and waning chest discomfort.  Sometimes  this is as severe as 9 out of 10.  Sometimes it is associated with shortness  of breath.  There is no exertional component to his chest component.   Wednesday evening, he had the very abrupt onset of severe substernal chest  pain which knocked me to the floor.  He was seen at [facility]  Emergency Room where electrocardiogram was reportedly normal and troponin  was normal.  He was discharged to home.   Over the subsequent two and a half days, he has had near continual chest  discomfort sometimes worse than others but never resolved.  He says he has  had more than 20 episodes this week but no clear resolution.  He was seen by  Dr. Titus Dubin. Alwyn Ren today who referred him to the hospital for admission.   PAST MEDICAL HISTORY:  1. Dyslipidemia.  2. Diverticulitis.  3. Status post jaw surgery.  4. Status post shoulder surgery on his right shoulder x 2.   ALLERGIES:  No known drug allergies.   MEDICATIONS:  None.   SOCIAL HISTORY:  Lives in [place] with his wife.  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  Describes a great deal of stress on current work  project.  Uses one can of snuff every couple of days.  Occasional alcohol  use.   FAMILY  HISTORY:  His mother and father are both alive at 61 without coronary  disease.   REVIEW OF SYMPTOMS:  Remarkable only for arthralgias.  Otherwise is negative  in detail except as above.   PHYSICAL EXAMINATION:  GENERAL:  He is a well-appearing man in no distress.  VITAL SIGNS:  Pulse 80, blood pressure 134/60, respiratory rate 16,  temperature 99.0.  Oxygen saturation 97% on room air.  NECK:  He has no jugular venous distention.  LUNGS:  Clear to auscultation and percussion bilaterally.  HEART:  He has a nondisplaced point of maximal cardiac impulse.  He has a  regular rate and rhythm without murmur, rub, S3, or S4.  ABDOMEN:  Moderately obese, soft, nondistended, and nontender.  There is no  hepatosplenomegaly.  Bowel sounds are normal.  There is no pulsatile mass.  EXTREMITIES:  Warm without clubbing, cyanosis, or edema.  PULSES:  Carotid pulses are 2+ bilaterally without bruits.  Radial pulses  are 2+ bilaterally.  Femoral pulses are 2+ bilaterally without bruits.  Dorsalis pedis pulses are 2+ bilaterally.   LABORATORY DATA:  Electrocardiogram demonstrates normal sinus rhythm with  left axis deviation.  There is no evidence of ischemia.   Hematocrit 45, platelets 221,000, wbc's 7.  Potassium 3.8, creatinine 1.1,  glucose 87, CK 117, MB 1.4, troponin 0.01.  Lipase 24, amylase 52.   IMPRESSION:  This is a 50 year old gentleman with risk factors only of  possible dyslipidemia who presents with chest pain which is very atypical  for myocardial ischemia.  Further, electrocardiogram has no ischemic changes  and troponin is negative after longstanding pain.  Based on this  constellation of findings, there is a very low likelihood of coronary artery  disease.   The abrupt onset of severe pain Wednesday evening with waxing and waning  since then raises some concern for aortic dissection.  That diagnosis does  not, however, readily explain the symptoms that he has had over the past   several weeks.  Nonetheless, I think it important to exclude.  I think  anxiety with somatization is the most likely etiology of his discomfort.   PLAN:  We will plan on cycling serial enzymes to exclude myocardial  infarction.  We will continue aspirin.  We will avoid heparin given the low  likelihood of myocardial ischemia and possible diagnosis of aortic  dissection.  We will obtain CT angiogram to exclude aortic dissection.  If  this is normal, we will obtain EGG Cardiolite in the morning to exclude  myocardial ischemia.                                               Salvadore Farber, M.D. [facility]    WED/MEDQ  D:  05/03/2002  T:  05/04/2002  Job:  [id number]   cc:   Titus Dubin. Alwyn Ren, M.D. [facility]

## 2010-10-29 NOTE — H&P (Signed)
NAME:  Harry Carroll, Harry Carroll                          ACCOUNT NO.:  [id number]   MEDICAL RECORD NO.:  [id number]                   PATIENT TYPE:  INP   LOCATION:  3702                                 FACILITY:  [facility]   PHYSICIAN:  Titus Dubin. Alwyn Ren, M.D. [facility]         DATE OF BIRTH:  [birth year]   DATE OF ADMISSION:  05/03/2002  DATE OF DISCHARGE:                                HISTORY & PHYSICAL   HISTORY OF PRESENT ILLNESS:  The patient is a 50 year old white male  admitted with persistent intractable chest pain.   He has had chest pain intermittently over the last several weeks, but it has  become more progressive.  He was seen in the emergency room on April 29, 2002, for the chest pain and work-up was negative.  His pain has been so  severe since that office visit that he actually dropped to the floor the  evening of May 01, 2002.  He has had pain which has been essentially  continuous for several days.  He describes the pain as a palm sized area  of discomfort substernally with radiation laterally to each side.  He has  had no nausea or diaphoresis.  He has had no triggers for the chest pain and  there are no definite relieving factors.   In addition, he has had headache which was described as diffuse, bilateral,  sharp, and lasting minutes.  He has had no flushing or diarrhea associated  with the headache and chest pain.   He does admit to marked increase in stress at work recently.  Apparently his  supervisors requested that he take some time off.  He describes emotional  lability in the work environment.   PAST SURGICAL HISTORY:  1. Maxillofacial surgery in 1983 and 1984.  2. Right shoulder surgery on two occasions for injuries related to football.   PAST MEDICAL HISTORY:  1. In 1995, he was hospitalized with chest pain with a negative work-up.  2. Diverticulosis.  3. Degenerative arthritis.  4. Hyperlipidemia.  5. Elevated liver enzymes.   FAMILY HISTORY:   Prostate cancer in paternal grandfather.  There is no  family history of diabetes, hypertension, heart attack, or stroke.   SOCIAL HISTORY:  He has never smoked.  He has two to three alcoholic  beverages per week.  He does not exercise regular at this time.  He had been  using a treadmill up to a half of an hour a day with a speed of 3.8 miles  per hour with variable incline when seen for a physical in June.  He does  commute, working in [place], [place].   MEDICATIONS:  He is on no medications.   ALLERGIES:  He has no known drug allergies.   REVIEW OF SYMPTOMS:  Positive for decreased urinary force which has been  evaluated urologically.   PHYSICAL EXAMINATION:  WEIGHT:  245-1/2 pounds.  VITAL SIGNS:  Pulse  76 and regular, respiratory rate 17, blood pressure  110/78.  HEENT:  The sclerae are slightly injected.  The fundal exam reveals normal  vasculature.  The otolaryngologic exam is unremarkable.  CHEST:  Clear to auscultation.  HEART:  He has an S4 with slurring.  All pulses are intact.  ABDOMEN:  No organomegaly, masses, or tenderness.  LYMPHATICS:  He has no lymphadenopathy.  GENITOURINARY:  Deferred.  This exam was negative in June of this year.  MUSCULOSKELETAL:  No deficits.  SKIN:  Warm and dry.  NEUROLOGIC:  Homan's sign is negative.  He is exhibiting some denial in  reference to the chest pain, being reluctant to be admitted initially.  There are no neuropsychiatric deficits.   LABORATORY DATA:  The EKG revealed left axis deviation.   IMPRESSION AND PLAN:  He is admitted to rule out myocardial infarction.  In  view of the severity and persistence of his pain, it is felt that cardiac  catheterization will most likely be necessary.   He has exogenous stress and a Zoloft trial was to be initiated in the  hospital.   He was given three baby aspirin to chew and swallow in the office.  He was  to be transported to the hospital by nonemergency ambulance.   Cardiology was  contacted for consultation.  If the cardiac evaluation is negative and the  symptoms persistent, then a GI etiology will be pursued.  Likely his amylase  and Helicobacter pylori serologies were collected.                                               Titus Dubin. Alwyn Ren, M.D. [facility]    WFH/MEDQ  D:  05/04/2002  T:  05/04/2002  Job:  [id number]

## 2010-12-08 ENCOUNTER — Telehealth: Payer: Self-pay | Admitting: Internal Medicine

## 2010-12-08 NOTE — Telephone Encounter (Signed)
Dr Drue Novel please advise on status.

## 2010-12-10 NOTE — Telephone Encounter (Signed)
I just signed it today, we need to send a copy of my note from 08-06-10

## 2010-12-23 NOTE — Telephone Encounter (Signed)
Faxed 12/13/2010 --also sent notes as requested from 08/06/10

## 2011-01-14 ENCOUNTER — Telehealth: Payer: Self-pay | Admitting: Internal Medicine

## 2011-01-14 DIAGNOSIS — K5732 Diverticulitis of large intestine without perforation or abscess without bleeding: Secondary | ICD-10-CM

## 2011-01-14 MED ORDER — AMOXICILLIN-POT CLAVULANATE 875-125 MG PO TABS: 1.0000 | ORAL_TABLET | Freq: Two times a day (BID) | ORAL | Status: AC

## 2011-01-14 NOTE — Assessment & Plan Note (Addendum)
48 hrs increasing suprapubic pain and feels feverish - no urinary sxs Similar to diverticulitis in past, has been confirmed by ct Has had colonoscopy Augmentin 875 mg bid x 10 days and an office visit

## 2011-01-14 NOTE — Telephone Encounter (Signed)
See diverticulitis assessment and plan Needs rev Dr. Arlyce Dice 1-2 weeks and to call back sooner if worsening

## 2011-01-17 ENCOUNTER — Telehealth: Payer: Self-pay | Admitting: Gastroenterology

## 2011-01-17 NOTE — Telephone Encounter (Signed)
Medications were sent to the pharmacy by Dr. Leone Payor. Pt needs appt with Dr. Arlyce Dice in 1-2 weeks. Spoke with pt and let him know I am trying to find him an appt, waiting for possible cancellation. Pt aware.

## 2011-01-26 NOTE — Telephone Encounter (Signed)
Pt scheduled to follow-up with Dr. Arlyce Dice 02/18/11@11 :15am. Pt aware of appt date and time.

## 2011-02-04 ENCOUNTER — Encounter: Payer: 59 | Admitting: Internal Medicine

## 2011-02-18 ENCOUNTER — Encounter: Payer: Self-pay | Admitting: Gastroenterology

## 2011-02-18 ENCOUNTER — Ambulatory Visit (INDEPENDENT_AMBULATORY_CARE_PROVIDER_SITE_OTHER): Payer: 59 | Admitting: Gastroenterology

## 2011-02-18 VITALS — BP 128/60 | HR 74 | Ht 69.0 in | Wt 254.0 lb

## 2011-02-18 DIAGNOSIS — Z860100 Personal history of colon polyps, unspecified: Secondary | ICD-10-CM | POA: Insufficient documentation

## 2011-02-18 DIAGNOSIS — K5732 Diverticulitis of large intestine without perforation or abscess without bleeding: Secondary | ICD-10-CM

## 2011-02-18 MED ORDER — HYOSCYAMINE SULFATE 0.125 MG SL SUBL: 0.2500 mg | SUBLINGUAL_TABLET | SUBLINGUAL | Status: DC | PRN

## 2011-02-18 NOTE — Patient Instructions (Signed)
Diverticulosis Diverticulosis is a common condition that develops when small pouches (diverticula) form in the wall of the colon. The risk of diverticulosis increases with age. It happens more often in people who eat a low-fiber diet. Most individuals with diverticulosis have no symptoms. Those individuals with symptoms usually experience belly (abdominal) pain, constipation, or loose stools (diarrhea). HOME CARE INSTRUCTIONS  Increase the amount of fiber in your diet as directed by your caregiver or dietician. This may reduce symptoms of diverticulosis.   Your caregiver may recommend taking a dietary fiber supplement.   Drink at least 6 to 8 glasses of water each day to prevent constipation.   Try not to strain when you have a bowel movement.   Your caregiver may recommend avoiding nuts and seeds to prevent complications, although this is still an uncertain benefit.   Only take over-the-counter or prescription medicines for pain, discomfort, or fever as directed by your caregiver.  FOODS HAVING HIGH FIBER CONTENT INCLUDE:  Fruits. Apple, peach, pear, tangerine, raisins, prunes.   Vegetables. Brussels sprouts, asparagus, broccoli, cabbage, carrot, cauliflower, romaine lettuce, spinach, summer squash, tomato, winter squash, zucchini.   Starchy Vegetables. Baked beans, kidney beans, lima beans, split peas, lentils, potatoes (with skin).   Grains. Whole wheat bread, brown rice, bran flake cereal, plain oatmeal, white rice, shredded wheat, bran muffins.  SEEK IMMEDIATE MEDICAL CARE IF:  You develop increasing pain or severe bloating.   You develop vomiting or bowel movements that are bloody or black.  Document Released: 02/25/2004 Document Re-Released: 11/17/2009 [facility] Patient Information 2011 [place], [place].

## 2011-02-18 NOTE — Assessment & Plan Note (Addendum)
This is his second episode in a year and probably his fourth episode in total. I discussed the consideration of an elective resection if this pattern continues for he is having recurrent episodes of acute diverticulitis.

## 2011-02-18 NOTE — Assessment & Plan Note (Signed)
Plan followup colonoscopy in 2013

## 2011-02-18 NOTE — Progress Notes (Signed)
History of Present Illness:  Mr. Scheaffer has returned for reevaluation of abdominal pain.  Approximately 3 weeks ago he developed recurrent severe lower bowel pain accompanied by fevers for which he was empirically placed on Augmentin for presumed recurrent diverticulitis. Symptoms have subsequently subsided. He is now left with very mild residual discomfort in the left lower quadrant.    Review of Systems: Pertinent positive and negative review of systems were noted in the above HPI section. All other review of systems were otherwise negative.    Current Medications, Allergies, Past Medical History, Past Surgical History, Family History and Social History were reviewed in [employer] electronic medical record  Vital signs were reviewed in today's medical record. Physical Exam: General: Well developed , well nourished, no acute distress Head: Normocephalic and atraumatic Eyes:  sclerae anicteric, EOMI Ears: Normal auditory acuity Mouth: No deformity or lesions Lungs: Clear throughout to auscultation Heart: Regular rate and rhythm; no murmurs, rubs or bruits Abdomen: Soft, non tender and non distended. No masses, hepatosplenomegaly or hernias noted. Normal Bowel sounds Rectal:deferred Musculoskeletal: Symmetrical with no gross deformities  Pulses:  Normal pulses noted Extremities: No clubbing, cyanosis, edema or deformities noted Neurological: Alert oriented x 4, grossly nonfocal Psychological:  Alert and cooperative. Normal mood and affect

## 2011-02-24 ENCOUNTER — Encounter: Payer: Self-pay | Admitting: Internal Medicine

## 2011-02-25 ENCOUNTER — Ambulatory Visit (INDEPENDENT_AMBULATORY_CARE_PROVIDER_SITE_OTHER): Payer: 59 | Admitting: Internal Medicine

## 2011-02-25 ENCOUNTER — Encounter: Payer: Self-pay | Admitting: Internal Medicine

## 2011-02-25 DIAGNOSIS — Z0000 Encounter for general adult medical examination without abnormal findings: Secondary | ICD-10-CM | POA: Insufficient documentation

## 2011-02-25 LAB — CBC WITH DIFFERENTIAL/PLATELET
Basophils Relative: 0.5 % (ref 0.0–3.0)
Eosinophils Absolute: 0.1 10*3/uL (ref 0.0–0.7)
Hemoglobin: 14.9 g/dL (ref 13.0–17.0)
Lymphocytes Relative: 36.2 % (ref 12.0–46.0)
MCHC: 33.9 g/dL (ref 30.0–36.0)
Neutro Abs: 2.7 10*3/uL (ref 1.4–7.7)
RBC: 4.72 Mil/uL (ref 4.22–5.81)

## 2011-02-25 LAB — LIPID PANEL
Cholesterol: 279 mg/dL — ABNORMAL HIGH (ref 0–200)
Total CHOL/HDL Ratio: 5

## 2011-02-25 LAB — BASIC METABOLIC PANEL
BUN: 22 mg/dL (ref 6–23)
Chloride: 101 mEq/L (ref 96–112)
Creatinine, Ser: 1.1 mg/dL (ref 0.4–1.5)
GFR: 72.49 mL/min (ref >60.00)

## 2011-02-25 NOTE — Assessment & Plan Note (Signed)
Td 2001 and 2011 Cscope 12-08, tics and polyps Diet-exercise discussed optifast info provided

## 2011-02-25 NOTE — Progress Notes (Signed)
  Subjective:    Patient ID: Harry Carroll, male    DOB: April 11, 1961, 50 y.o.   MRN: [id number]  HPI CPX   Past Medical History: COLONIC POLYPS, ADENOMATOUS  & H/O DIVERTICULITIS HYPERLIPIDEMIA  Depression- Bipolar (Dr. Tomasa Rand) insomnia (Dr. 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 To have a MRI of the left shoulder as ordered by orthopedic No chest pain or shortness or breath No nausea, vomiting, diarrhea No dysuria or gross hematuria. Depression well controlled, sees psychiatry regularly.      Objective:   Physical Exam  Constitutional: He is oriented to person, place, and time. He appears well-developed. No distress.       Overweight appearing  Neck: No thyromegaly present.  Cardiovascular: Normal rate, regular rhythm and normal heart sounds.   No murmur heard. Pulmonary/Chest: Effort normal. No respiratory distress. He has no wheezes. He has no rales.  Abdominal: Soft. He exhibits no distension. There is no tenderness. There is no rebound and no guarding.  Genitourinary: Rectum normal and prostate normal.  Musculoskeletal: He exhibits no edema.  Neurological: He is oriented to person, place, and time.  Skin: He is not diaphoretic.  Psychiatric: He has a normal mood and affect. His  behavior is normal. Judgment and thought content normal.          Assessment & Plan:

## 2011-03-04 ENCOUNTER — Other Ambulatory Visit: Payer: Self-pay | Admitting: *Deleted

## 2011-03-04 DIAGNOSIS — E785 Hyperlipidemia, unspecified: Secondary | ICD-10-CM

## 2011-03-04 MED ORDER — ATORVASTATIN CALCIUM 40 MG PO TABS: 40.0000 mg | ORAL_TABLET | Freq: Every day | ORAL | Status: DC

## 2011-03-04 NOTE — Telephone Encounter (Signed)
Labs ordered.

## 2011-03-04 NOTE — Telephone Encounter (Signed)
Spoke with patient about lab results.  rx sent and lab orders done.

## 2011-03-23 LAB — URINALYSIS, ROUTINE W REFLEX MICROSCOPIC
Bilirubin Urine: NEGATIVE
Glucose, UA: NEGATIVE
Hgb urine dipstick: NEGATIVE
Nitrite: NEGATIVE
Protein, ur: 30 — AB
Specific Gravity, Urine: 1.029
Urobilinogen, UA: 1
pH: 7.5

## 2011-03-23 LAB — DIFFERENTIAL
Basophils Absolute: 0
Basophils Relative: 0
Eosinophils Absolute: 0.1
Eosinophils Relative: 1
Lymphocytes Relative: 14
Lymphs Abs: 1.6
Monocytes Absolute: 0.7
Monocytes Relative: 6
Neutro Abs: 8.9 — ABNORMAL HIGH
Neutrophils Relative %: 79 — ABNORMAL HIGH

## 2011-03-23 LAB — COMPREHENSIVE METABOLIC PANEL
ALT: 22
AST: 21
Albumin: 3.7
Alkaline Phosphatase: 41
BUN: 13
CO2: 24
Calcium: 8.7
Chloride: 102
Creatinine, Ser: 1.12
GFR calc Af Amer: >60
GFR calc non Af Amer: >60
Glucose, Bld: 124 — ABNORMAL HIGH
Potassium: 3.8
Sodium: 135
Total Bilirubin: 1
Total Protein: 6.6

## 2011-03-23 LAB — CBC
HCT: 42
Hemoglobin: 14.6
MCHC: 34.7
MCV: 90.3
Platelets: 214
RBC: 4.65
RDW: 12.6
WBC: 11.4 — ABNORMAL HIGH

## 2011-03-23 LAB — URINE MICROSCOPIC-ADD ON

## 2011-03-23 LAB — URINE CULTURE

## 2011-06-17 ENCOUNTER — Telehealth: Payer: Self-pay | Admitting: *Deleted

## 2011-06-17 MED ORDER — BENZONATATE 100 MG PO CAPS: 100.0000 mg | ORAL_CAPSULE | Freq: Four times a day (QID) | ORAL | Status: AC | PRN

## 2011-06-17 NOTE — Telephone Encounter (Signed)
Request for Cough medication [no fever and/or SOB], sputum white in color. [facility]

## 2011-06-17 NOTE — Telephone Encounter (Signed)
In addition to Mucinex DM over-the-counter, in addition, send a prescription for Tessalon to the pharmacy he is requesting. Office visit if not improving soon

## 2011-06-17 NOTE — Telephone Encounter (Signed)
Rx Done. Patient informed. 

## 2011-06-20 ENCOUNTER — Telehealth: Payer: Self-pay

## 2011-06-20 NOTE — Telephone Encounter (Signed)
Pt states he was told to call back if symptoms worsen or change.  Pt states he has developed a fever of 102 that comes and goes.  Pt would like to know how long he should stay out of work.  Pls advise.

## 2011-06-20 NOTE — Telephone Encounter (Signed)
Needs to be out of work's at least until he is afebrile for a day  Also needs a office visit, schedule it  for tomorrow

## 2011-06-21 NOTE — Telephone Encounter (Signed)
Pt is aware and has gone to work today.

## 2011-08-18 ENCOUNTER — Telehealth: Payer: Self-pay

## 2011-08-18 MED ORDER — AMBULATORY NON FORMULARY MEDICATION: Status: DC

## 2011-08-18 NOTE — Telephone Encounter (Signed)
Patient wants to have labs drawn at [facility] labs instead of coming here.

## 2011-08-29 ENCOUNTER — Telehealth: Payer: Self-pay | Admitting: Internal Medicine

## 2011-08-29 NOTE — Telephone Encounter (Signed)
What labs does he need?

## 2011-08-29 NOTE — Telephone Encounter (Signed)
Patient wants to Wednesday to [facility] & have labs for CPE Drawn, can you please put orders in the system, please. Thanks

## 2011-08-30 ENCOUNTER — Other Ambulatory Visit: Payer: 59

## 2011-08-30 ENCOUNTER — Other Ambulatory Visit: Payer: Self-pay | Admitting: Internal Medicine

## 2011-08-30 MED ORDER — ATORVASTATIN CALCIUM 40 MG PO TABS: 40.0000 mg | ORAL_TABLET | Freq: Every day | ORAL | Status: DC

## 2011-08-30 NOTE — Telephone Encounter (Signed)
Called patient LVOM to call back regarding RX, CPE & labs - 3.19.13 @ 1:15pm-smc

## 2011-08-30 NOTE — Telephone Encounter (Signed)
Sent refill into new pharmacy.

## 2011-08-30 NOTE — Telephone Encounter (Signed)
He is not due for a CPX. He was  Rx lipitor 40 mg 1 po qhs several months ago, IF he has been taking it consistently, needs a FLP AST ALT dx hyperlipidemia. IF he has not been taking lipitor , call it in and arrange a OV , fasting in 6 weeks

## 2011-08-30 NOTE — Telephone Encounter (Signed)
Patient has not been taking lipitor, was trying exercise & meal plan. [facility] [address] [facility] to get him thru til 4.30.13 OV wt/fasting. Thanks

## 2011-09-02 ENCOUNTER — Ambulatory Visit: Payer: 59 | Admitting: Internal Medicine

## 2011-09-19 ENCOUNTER — Telehealth: Payer: Self-pay | Admitting: Internal Medicine

## 2011-10-07 NOTE — Telephone Encounter (Signed)
No show fee requested remove.

## 2011-10-11 ENCOUNTER — Ambulatory Visit (INDEPENDENT_AMBULATORY_CARE_PROVIDER_SITE_OTHER): Payer: 59 | Admitting: Internal Medicine

## 2011-10-11 ENCOUNTER — Encounter: Payer: Self-pay | Admitting: Internal Medicine

## 2011-10-11 VITALS — BP 140/82 | HR 83 | Temp 98.2°F | Wt 257.0 lb

## 2011-10-11 DIAGNOSIS — R5383 Other fatigue: Secondary | ICD-10-CM | POA: Insufficient documentation

## 2011-10-11 DIAGNOSIS — E785 Hyperlipidemia, unspecified: Secondary | ICD-10-CM

## 2011-10-11 DIAGNOSIS — R5381 Other malaise: Secondary | ICD-10-CM

## 2011-10-11 NOTE — Patient Instructions (Signed)
Exercise 3 hours a week [employer]? Weight Watchers? Optifast?

## 2011-10-11 NOTE — Assessment & Plan Note (Addendum)
Was recommended Lipitor based on the last cholesterol panel, started Lipitor 6 weeks ago. No apparent side effects. Extensive discussion about diet and exercise.  Labs

## 2011-10-11 NOTE — Progress Notes (Signed)
  Subjective:    Patient ID: Harry Carroll, male    DOB: Apr 14, 1961, 51 y.o.   MRN: [id number]  HPI Routine office visit Started Lipitor 6 weeks ago, good compliance and tolerance. Complaint of fatigue, ongoing issue. See assessment and plan  Past Medical History:  COLONIC POLYPS, ADENOMATOUS & H/O DIVERTICULITIS  HYPERLIPIDEMIA  Depression- Bipolar (Dr. Tomasa Rand)  insomnia (Dr. 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 He did well with exercise few weeks ago but stopped. He felt better while  exercising     Objective:   Physical Exam   A, ox3, NAD     Assessment & Plan:  Today , I spent more than 20 min with the patient, >50% of the time counseling

## 2011-10-11 NOTE — Assessment & Plan Note (Addendum)
Complains of lack of energy which is ongoing issue. He does not feel rested in the morning, very tired at the end of the day. Admits to moderate to severe snoring. Denies falling asleep at work. Reports sleep study years ago which was negative for sleep apnea but reportedly showed RLS. Plan: [employer]nea[employer] to reassess sleep apnea.

## 2011-10-12 LAB — AST: AST: 25 U/L (ref 0–37)

## 2011-10-12 LAB — LIPID PANEL: VLDL: 21.8 mg/dL (ref 0.0–40.0)

## 2011-10-12 LAB — ALT: ALT: 38 U/L (ref 0–53)

## 2011-10-14 ENCOUNTER — Other Ambulatory Visit: Payer: Self-pay | Admitting: *Deleted

## 2011-10-14 MED ORDER — ATORVASTATIN CALCIUM 40 MG PO TABS: 20.0000 mg | ORAL_TABLET | Freq: Every day | ORAL | Status: DC

## 2011-10-24 ENCOUNTER — Ambulatory Visit (INDEPENDENT_AMBULATORY_CARE_PROVIDER_SITE_OTHER): Payer: 59 | Admitting: Pulmonary Disease

## 2011-10-24 DIAGNOSIS — R5383 Other fatigue: Secondary | ICD-10-CM

## 2011-10-24 DIAGNOSIS — G4733 Obstructive sleep apnea (adult) (pediatric): Secondary | ICD-10-CM

## 2011-10-25 ENCOUNTER — Telehealth: Payer: Self-pay

## 2011-10-25 DIAGNOSIS — G4730 Sleep apnea, unspecified: Secondary | ICD-10-CM

## 2011-10-25 NOTE — Telephone Encounter (Signed)
Left message on voicemail for patient to return call when available   

## 2011-10-25 NOTE — Telephone Encounter (Signed)
Message copied by Maurice Small on Tue Oct 25, 2011  4:51 PM ------      Message from: Wanda Plump      Created: Tue Oct 25, 2011  1:07 PM       Advise patient, test shows moderate sleep apnea. I recommend a pulmonology eval w/ Dr Shelle Iron

## 2011-10-26 NOTE — Telephone Encounter (Signed)
LMOVM for pt to return call 

## 2011-10-27 NOTE — Telephone Encounter (Signed)
Discussed with pt, referral entered.  

## 2011-11-08 ENCOUNTER — Telehealth: Payer: Self-pay | Admitting: Gastroenterology

## 2011-11-08 NOTE — Telephone Encounter (Signed)
Spoke with pt and let him know to keep the follow-up appointment to assess how he is doing.

## 2011-11-11 ENCOUNTER — Encounter: Payer: Self-pay | Admitting: Gastroenterology

## 2011-11-11 ENCOUNTER — Ambulatory Visit (INDEPENDENT_AMBULATORY_CARE_PROVIDER_SITE_OTHER): Payer: 59 | Admitting: Gastroenterology

## 2011-11-11 VITALS — BP 118/76 | HR 80 | Ht 69.0 in | Wt 256.0 lb

## 2011-11-11 DIAGNOSIS — K5732 Diverticulitis of large intestine without perforation or abscess without bleeding: Secondary | ICD-10-CM

## 2011-11-11 DIAGNOSIS — D126 Benign neoplasm of colon, unspecified: Secondary | ICD-10-CM

## 2011-11-11 MED ORDER — PEG-KCL-NACL-NASULF-NA ASC-C 100 G PO SOLR: 1.0000 | Freq: Once | ORAL | Status: DC

## 2011-11-11 NOTE — Patient Instructions (Signed)
Your colonoscopy is scheduled on 12/20/2011 at 2pm  Separate instructions have been given Your Appointment with Dr Derrell Lolling is scheduled on 12/08/2011 at 11:15

## 2011-11-11 NOTE — Assessment & Plan Note (Addendum)
This is Harry Carroll's fourth or fifth episode of acute diverticulitis in the last 2 years.  At his last visit 7 months ago I brought up the consideration for elective left colon resection. We discussed this again because of his risk for recurrent diverticulitis and complications from this. He is agreeable to elective resection.  Recommendations #1 refer to Gen. surgery (Dr. Derrell Lolling) for consideration of a left segmental colon resection. I will repeat his colonoscopy prior to surgery in view of his history of colon polyps.

## 2011-11-11 NOTE — Assessment & Plan Note (Signed)
Plan followup colonoscopy 

## 2011-11-11 NOTE — Progress Notes (Signed)
History of Present Illness: Harry Carroll has returned for evaluation of abdominal pain. Approximately a week ago he developed moderately severe left lower quadrant pain accompanied by fever. He was placed on Cipro and Flagyl with resolution of his symptoms. He now has only mild abdominal discomfort. Pain was reminiscent of his prior episodes of diverticulitis. Last colonoscopy 2008  demonstrated left-sided diverticulosis and adenomatous polyps which were removed.    Review of Systems: Pertinent positive and negative review of systems were noted in the above HPI section. All other review of systems were otherwise negative.    Current Medications, Allergies, Past Medical History, Past Surgical History, Family History and Social History were reviewed in [employer] electronic medical record  Vital signs were reviewed in today's medical record. Physical Exam: General: Well developed , well nourished, no acute distress Head: Normocephalic and atraumatic Eyes:  sclerae anicteric, EOMI Ears: Normal auditory acuity Mouth: No deformity or lesions Lungs: Clear throughout to auscultation Heart: Regular rate and rhythm; no murmurs, rubs or bruits Abdomen: Soft, and non distended. No masses, hepatosplenomegaly or hernias noted. Normal Bowel sounds. There is mild tenderness in the left lower quadrant just inferior to the umbilicus Rectal:deferred Musculoskeletal: Symmetrical with no gross deformities  Pulses:  Normal pulses noted Extremities: No clubbing, cyanosis, edema or deformities noted Neurological: Alert oriented x 4, grossly nonfocal Psychological:  Alert and cooperative. Normal mood and affect

## 2011-11-15 ENCOUNTER — Emergency Department (HOSPITAL_COMMUNITY)
Admission: EM | Admit: 2011-11-15 | Discharge: 2011-11-15 | Disposition: A | Discharge status: Home / Self Care | Payer: 59 | Attending: Emergency Medicine | Admitting: Emergency Medicine

## 2011-11-15 ENCOUNTER — Telehealth: Payer: Self-pay | Admitting: Gastroenterology

## 2011-11-15 ENCOUNTER — Encounter (HOSPITAL_COMMUNITY): Payer: Self-pay | Admitting: Family Medicine

## 2011-11-15 ENCOUNTER — Emergency Department (HOSPITAL_COMMUNITY): Payer: 59

## 2011-11-15 DIAGNOSIS — R110 Nausea: Secondary | ICD-10-CM | POA: Insufficient documentation

## 2011-11-15 DIAGNOSIS — R011 Cardiac murmur, unspecified: Secondary | ICD-10-CM | POA: Insufficient documentation

## 2011-11-15 DIAGNOSIS — E785 Hyperlipidemia, unspecified: Secondary | ICD-10-CM | POA: Insufficient documentation

## 2011-11-15 DIAGNOSIS — R1032 Left lower quadrant pain: Secondary | ICD-10-CM | POA: Insufficient documentation

## 2011-11-15 DIAGNOSIS — K5732 Diverticulitis of large intestine without perforation or abscess without bleeding: Secondary | ICD-10-CM | POA: Insufficient documentation

## 2011-11-15 DIAGNOSIS — K5792 Diverticulitis of intestine, part unspecified, without perforation or abscess without bleeding: Secondary | ICD-10-CM

## 2011-11-15 LAB — CBC
MCV: 88.2 fL (ref 78.0–100.0)
Platelets: 212 10*3/uL (ref 150–400)
RBC: 4.68 MIL/uL (ref 4.22–5.81)
WBC: 5.1 10*3/uL (ref 4.0–10.5)

## 2011-11-15 LAB — DIFFERENTIAL
Eosinophils Relative: 2 % (ref 0–5)
Lymphocytes Relative: 33 % (ref 12–46)
Lymphs Abs: 1.7 10*3/uL (ref 0.7–4.0)
Neutro Abs: 2.8 10*3/uL (ref 1.7–7.7)

## 2011-11-15 LAB — URINE MICROSCOPIC-ADD ON

## 2011-11-15 LAB — COMPREHENSIVE METABOLIC PANEL
ALT: 53 U/L (ref 0–53)
Alkaline Phosphatase: 35 U/L — ABNORMAL LOW (ref 39–117)
CO2: 24 mEq/L (ref 19–32)
Calcium: 9.2 mg/dL (ref 8.4–10.5)
Chloride: 100 mEq/L (ref 96–112)
GFR calc Af Amer: >90 mL/min (ref >90)
GFR calc non Af Amer: 78 mL/min — ABNORMAL LOW (ref >90)
Glucose, Bld: 84 mg/dL (ref 70–99)
Potassium: 4 mEq/L (ref 3.5–5.1)
Sodium: 134 mEq/L — ABNORMAL LOW (ref 135–145)
Total Bilirubin: 0.5 mg/dL (ref 0.3–1.2)

## 2011-11-15 LAB — URINALYSIS, ROUTINE W REFLEX MICROSCOPIC
Bilirubin Urine: NEGATIVE
Glucose, UA: NEGATIVE mg/dL
Hgb urine dipstick: NEGATIVE
Ketones, ur: NEGATIVE mg/dL
Protein, ur: NEGATIVE mg/dL
Urobilinogen, UA: 0.2 mg/dL (ref 0.0–1.0)

## 2011-11-15 MED ORDER — AMOXICILLIN-POT CLAVULANATE 875-125 MG PO TABS
1.0000 | ORAL_TABLET | Freq: Once | ORAL | Status: AC
  Administered 2011-11-15: 1 via ORAL
  Filled 2011-11-15: qty 1

## 2011-11-15 MED ORDER — HYDROMORPHONE HCL PF 1 MG/ML IJ SOLN
1.0000 mg | Freq: Once | INTRAMUSCULAR | Status: AC
  Administered 2011-11-15: 1 mg via INTRAVENOUS
  Filled 2011-11-15: qty 1

## 2011-11-15 MED ORDER — SODIUM CHLORIDE 0.9 % IV BOLUS (SEPSIS)
1000.0000 mL | Freq: Once | INTRAVENOUS | Status: AC
  Administered 2011-11-15: 1000 mL via INTRAVENOUS

## 2011-11-15 MED ORDER — OXYCODONE-ACETAMINOPHEN 5-325 MG PO TABS: 1.0000 | ORAL_TABLET | Freq: Four times a day (QID) | ORAL | Status: AC | PRN

## 2011-11-15 MED ORDER — ONDANSETRON HCL 4 MG/2ML IJ SOLN
4.0000 mg | Freq: Once | INTRAMUSCULAR | Status: AC
  Administered 2011-11-15: 4 mg via INTRAVENOUS
  Filled 2011-11-15: qty 2

## 2011-11-15 MED ORDER — AMOXICILLIN-POT CLAVULANATE 875-125 MG PO TABS: 1.0000 | ORAL_TABLET | Freq: Two times a day (BID) | ORAL | Status: DC

## 2011-11-15 MED ORDER — IOHEXOL 300 MG/ML  SOLN
100.0000 mL | Freq: Once | INTRAMUSCULAR | Status: AC | PRN
  Administered 2011-11-15: 100 mL via INTRAVENOUS

## 2011-11-15 NOTE — ED Notes (Signed)
Urine was collected.

## 2011-11-15 NOTE — ED Notes (Signed)
Pt returned from CT scan.

## 2011-11-15 NOTE — Discharge Instructions (Signed)
Diverticulitis A diverticulum is a small pouch or sac on the colon. Diverticulosis is the presence of these diverticula on the colon. Diverticulitis is the irritation (inflammation) or infection of diverticula. CAUSES  The colon and its diverticula contain bacteria. If food particles block the tiny opening to a diverticulum, the bacteria inside can grow and cause an increase in pressure. This leads to infection and inflammation and is called diverticulitis. SYMPTOMS   Abdominal pain and tenderness. Usually, the pain is located on the left side of your abdomen. However, it could be located elsewhere.   Fever.   Bloating.   Feeling sick to your stomach (nausea).   Throwing up (vomiting).   Abnormal stools.  DIAGNOSIS  Your caregiver will take a history and perform a physical exam. Since many things can cause abdominal pain, other tests may be necessary. Tests may include:  Blood tests.   Urine tests.   X-ray of the abdomen.   CT scan of the abdomen.  Sometimes, surgery is needed to determine if diverticulitis or other conditions are causing your symptoms. TREATMENT  Most of the time, you can be treated without surgery. Treatment includes:  Resting the bowels by only having liquids for a few days. As you improve, you will need to eat a low-fiber diet.   Intravenous (IV) fluids if you are losing body fluids (dehydrated).   Antibiotic medicines that treat infections may be given.   Pain and nausea medicine, if needed.   Surgery if the inflamed diverticulum has burst.  HOME CARE INSTRUCTIONS   Try a clear liquid diet (broth, tea, or water for as long as directed by your caregiver). You may then gradually begin a low-fiber diet as tolerated. A low-fiber diet is a diet with less than 10 grams of fiber. Choose the foods below to reduce fiber in the diet:   White breads, cereals, rice, and pasta.   Cooked fruits and vegetables or soft fresh fruits and vegetables without the skin.     Ground or well-cooked tender beef, ham, veal, lamb, pork, or poultry.   Eggs and seafood.   After your diverticulitis symptoms have improved, your caregiver may put you on a high-fiber diet. A high-fiber diet includes 14 grams of fiber for every 1000 calories consumed. For a standard 2000 calorie diet, you would need 28 grams of fiber. Follow these diet guidelines to help you increase the fiber in your diet. It is important to slowly increase the amount fiber in your diet to avoid gas, constipation, and bloating.   Choose whole-grain breads, cereals, pasta, and brown rice.   Choose fresh fruits and vegetables with the skin on. Do not overcook vegetables because the more vegetables are cooked, the more fiber is lost.   Choose more nuts, seeds, legumes, dried peas, beans, and lentils.   Look for food products that have greater than 3 grams of fiber per serving on the Nutrition Facts label.   Take all medicine as directed by your caregiver.   If your caregiver has given you a follow-up appointment, it is very important that you go. Not going could result in lasting (chronic) or permanent injury, pain, and disability. If there is any problem keeping the appointment, call to reschedule.  SEEK MEDICAL CARE IF:   Your pain does not improve.   You have a hard time advancing your diet beyond clear liquids.   Your bowel movements do not return to normal.  SEEK IMMEDIATE MEDICAL CARE IF:   Your pain becomes   worse.   You have an oral temperature above 102 F (38.9 C), not controlled by medicine.   You have repeated vomiting.   You have bloody or black, tarry stools.   Symptoms that brought you to your caregiver become worse or are not getting better.  MAKE SURE YOU:   Understand these instructions.   Will watch your condition.   Will get help right away if you are not doing well or get worse.  Document Released: 03/09/2005 Document Revised: 05/19/2011 Document Reviewed:  07/05/2010 [employer] Patient Information 2012 [employer]. 

## 2011-11-15 NOTE — Telephone Encounter (Signed)
Pt states he was seen last week for diverticulitis and placed on 2 antibiotics. At first he started feeling better and the fever went away. Pt reports that he is having quite a bit of pain now and does not know if he can wait until this afternoon to be seen. Pt states he is going to go to the ER to be seen.

## 2011-11-15 NOTE — ED Provider Notes (Signed)
History     CSN: [id number]  Arrival date & time 11/15/11  1136   First MD Initiated Contact with Patient 11/15/11 1211      Chief Complaint  Patient presents with  . Diverticulitis    (Consider location/radiation/quality/duration/timing/severity/associated sxs/prior treatment) HPI Comments: Has failed outpatient treatment. Has been on Cipro and Flagyl for the past 10 days without improvement of his symptoms. Followed by Dr. Arlyce Dice from GI.  Patient is a 51 y.o. male presenting with abdominal pain. The history is provided by the patient. No language interpreter was used.  Abdominal Pain The primary symptoms of the illness include abdominal pain and nausea. The primary symptoms of the illness do not include fever, fatigue, shortness of breath, vomiting, diarrhea, dysuria, vaginal discharge or vaginal bleeding. The current episode started more than 2 days ago (14 days ago). The onset of the illness was gradual. The problem has been gradually worsening.  The abdominal pain began more than 2 days ago. The pain came on gradually. The abdominal pain has been gradually worsening since its onset. The abdominal pain is located in the LLQ. The abdominal pain does not radiate. The abdominal pain is relieved by nothing. The abdominal pain is exacerbated by vomiting.  Symptoms associated with the illness do not include chills, constipation, urgency, frequency or back pain.    Past Medical History  Diagnosis Date  . Colon polyps     adenomatous  . Diverticulitis   . Hyperlipemia   . Depression   . Insomnia   . Heart murmur   . Arthritis   . Carpal tunnel syndrome     Past Surgical History  Procedure Date  . Maxillofacial surgery   . Right shoulder surgery   . Right knee arthroscopy     2007    Family History  Problem Relation Age of Onset  . Prostate cancer Paternal Grandfather     History  Substance Use Topics  . Smoking status: Never Smoker   . Smokeless tobacco: Never Used    . Alcohol Use: Yes      Review of Systems  Constitutional: Negative for fever, chills, activity change, appetite change and fatigue.  HENT: Negative for congestion, sore throat, rhinorrhea, neck pain and neck stiffness.   Respiratory: Negative for cough and shortness of breath.   Cardiovascular: Negative for chest pain and palpitations.  Gastrointestinal: Positive for nausea and abdominal pain. Negative for vomiting, diarrhea and constipation.  Genitourinary: Negative for dysuria, urgency, frequency, flank pain, vaginal bleeding and vaginal discharge.  Musculoskeletal: Negative for myalgias, back pain and arthralgias.  Neurological: Negative for dizziness, weakness, light-headedness, numbness and headaches.  All other systems reviewed and are negative.    Allergies  Hydrocodone  Home Medications   Current Outpatient Rx  Name Route Sig Dispense Refill  . ATORVASTATIN CALCIUM 40 MG PO TABS Oral Take 0.5 tablets (20 mg total) by mouth at bedtime. 30 tablet 0  . CIPROFLOXACIN HCL 500 MG PO TABS Oral Take 500 mg by mouth 3 (three) times daily.    Marland Kitchen DIVALPROEX SODIUM ER 500 MG PO TB24 Oral Take 1,000 mg by mouth every evening.     Marland Kitchen LAMOTRIGINE 100 MG PO TABS Oral Take 100 mg by mouth every evening.     Marland Kitchen METRONIDAZOLE 500 MG PO TABS Oral Take 500 mg by mouth 2 (two) times daily.    Marland Kitchen ZOLPIDEM TARTRATE 10 MG PO TABS Oral Take 5 mg by mouth at bedtime as needed. For sleep    .  AMOXICILLIN-POT CLAVULANATE 875-125 MG PO TABS Oral Take 1 tablet by mouth every 12 (twelve) hours. 14 tablet 0  . OXYCODONE-ACETAMINOPHEN 5-325 MG PO TABS Oral Take 1-2 tablets by mouth every 6 (six) hours as needed for pain. 20 tablet 0  . PEG-KCL-NACL-NASULF-NA ASC-C 100 G PO SOLR Oral Take 1 kit (100 g total) by mouth once. 1 kit 0    BP 112/73  Pulse 63  Temp(Src) 98.8 F (37.1 C) (Oral)  Resp 18  SpO2 95%  Physical Exam  Nursing note and vitals reviewed. Constitutional: He is oriented to person,  place, and time. He appears well-developed and well-nourished.  HENT:  Head: Normocephalic and atraumatic.  Mouth/Throat: Oropharynx is clear and moist.  Eyes: Conjunctivae and EOM are normal. Pupils are equal, round, and reactive to light.  Neck: Normal range of motion. Neck supple.  Cardiovascular: Normal rate, regular rhythm, normal heart sounds and intact distal pulses.  Exam reveals no gallop and no friction rub.   No murmur heard. Pulmonary/Chest: Effort normal and breath sounds normal. No respiratory distress. He exhibits no tenderness.  Abdominal: Soft. Bowel sounds are normal. There is tenderness (LLQ and RLQ pain). There is guarding. There is no rebound.  Musculoskeletal: Normal range of motion. He exhibits no edema and no tenderness.  Neurological: He is alert and oriented to person, place, and time. No cranial nerve deficit.  Skin: Skin is warm and dry. No rash noted. No erythema.    ED Course  Procedures (including critical care time)  Labs Reviewed  COMPREHENSIVE METABOLIC PANEL - Abnormal; Notable for the following:    Sodium 134 (*)    Alkaline Phosphatase 35 (*)    GFR calc non Af Amer 78 (*)    All other components within normal limits  URINALYSIS, ROUTINE W REFLEX MICROSCOPIC - Abnormal; Notable for the following:    Leukocytes, UA TRACE (*)    All other components within normal limits  URINE MICROSCOPIC-ADD ON - Abnormal; Notable for the following:    Bacteria, UA FEW (*)    All other components within normal limits  CBC  DIFFERENTIAL  LIPASE, BLOOD   Ct Abdomen Pelvis W Contrast  11/15/2011  *RADIOLOGY REPORT*  Clinical Data: Left lower quadrant pain.  Diverticulosis.  CT ABDOMEN AND PELVIS WITH CONTRAST  Technique:  Multidetector CT imaging of the abdomen and pelvis was performed following the standard protocol during bolus administration of intravenous contrast.  Contrast: OMNIPAQUE IOHEXOL 300 MG/ML  SOLN  Comparison: 04/13/2007  Findings: Mild  diverticulitis is seen involving the proximal sigmoid colon.  There is mild pericolonic inflammatory changes, but no evidence of extraluminal gas or abscess.  The abdominal parenchymal organs are normal in appearance. Probable tiny calcified gallstones seen, without evidence of cholecystitis. No soft tissue masses or lymphadenopathy identified.  IMPRESSION:  1.  Mild sigmoid diverticulitis. 2.  No evidence of abscess or other complication.  Original Report Authenticated By: Danae Orleans, M.D.     1. Diverticulitis       MDM  Diverticulitis. The patient has been on Cipro and Flagyl with continued pain. CT still with evidence of diverticulitis. He we placed on Augmentin after discussing with general surgery. Discussed with general surgery to ensure prompt followup. There is no indication for admission at this time. He has no surgical pathology at this time on his CT and has a normal white count.        Dayton Bailiff, MD 11/15/11 [phone]

## 2011-11-15 NOTE — ED Notes (Signed)
MD at bedside. 

## 2011-11-15 NOTE — ED Notes (Signed)
Pt reports hx of diverticulitis, pain worse today with nausea. States Harry Carroll has been on abx for 10 days, last saw Dr. Arlyce Dice on this past Thursday and was told Harry Carroll needed to have surgery but pt wanted to wait. States abx usually clears it up but pain is getting worse.

## 2011-11-16 ENCOUNTER — Telehealth: Payer: Self-pay | Admitting: Gastroenterology

## 2011-11-16 ENCOUNTER — Telehealth (INDEPENDENT_AMBULATORY_CARE_PROVIDER_SITE_OTHER): Payer: Self-pay | Admitting: General Surgery

## 2011-11-16 NOTE — Telephone Encounter (Signed)
Pt was seen in er yesterday, still having pain from diverticulitis. Pt changed from cipro and flagyl to augmentin. Pt called [facility] this morning, he was told to call them this am by the er. Pt is waiting to hear back from [facility] with a sooner appt. Instructed pt to call us if he got worse.

## 2011-11-16 NOTE — Telephone Encounter (Signed)
Pt calling in follow-up to ER visit yesterday for diverticulitis, where his instructions were to be seen immediately by surgeon.  He already has appt with [facility] for July.  Pt informed we will call him to move up appt if a cancellation opens a spot on surgeon's schedule, otherwise will keep the appt as is.  He understands.  He sees Dr. Arlyce Dice for GI.

## 2011-11-16 NOTE — Telephone Encounter (Signed)
Per Dr Derrell Lolling pt moved up to 6-13. Pt aware of appt change.

## 2011-11-17 ENCOUNTER — Telehealth: Payer: Self-pay

## 2011-11-17 ENCOUNTER — Ambulatory Visit (INDEPENDENT_AMBULATORY_CARE_PROVIDER_SITE_OTHER): Payer: 59 | Admitting: Pulmonary Disease

## 2011-11-17 ENCOUNTER — Encounter: Payer: Self-pay | Admitting: Pulmonary Disease

## 2011-11-17 VITALS — BP 116/80 | HR 77 | Temp 98.4°F | Ht 69.0 in | Wt 259.0 lb

## 2011-11-17 DIAGNOSIS — G4733 Obstructive sleep apnea (adult) (pediatric): Secondary | ICD-10-CM | POA: Insufficient documentation

## 2011-11-17 NOTE — Progress Notes (Signed)
Subjective:    Patient ID: Harry Carroll, male    DOB: September 07, 1960, 51 y.o.   MRN: [id number]  HPI The patient is a 51 year old male who I have been asked to see for obstructive sleep apnea.  He recently underwent sleep testing where he was found to have moderate OSA with an AHI of 31 events per hour.  The patient has been noted have left snoring, but his wife has never commented on an abnormal breathing pattern during sleep.  He has frequent awakenings at night, and does not feel rested in the mornings upon arising.  He has had a sleep study in the past that did not show sleep disordered breathing, but did shows large numbers of leg jerks with sleep disruption.  His wife currently has not complained of kicking during the night, but the patient does have a feeling of "needing to stretch his legs" at times during the day and evening.  The patient does have sleep pressure primarily afternoons while at work, especially with meetings.  He also notes sleepiness watching television or movies in the evening and can frequently doze one or more hours in the evening.  He has intermittent sleepiness with driving longer distances.  Of note, the patient states his weight is up 20 pounds since 2006, and his Epworth sleepiness score today is abnormal at 11.  Sleep Questionnaire: What time do you typically go to bed?( Between what hours) between 10-11pm How long does it take you to fall asleep? How many times during the night do you wake up? 3 What time do you get out of bed to start your day? 0630 Do you drive or operate heavy machinery in your occupation? No How much has your weight changed (up or down) over the past two years? (In pounds) 10 lb (4.536 kg) Have you ever had a sleep study before? Yes If yes, location of study? [facility], If yes, date of study? 2-3 years ago Do you currently use CPAP? No Do you wear oxygen at any time? No    Review of Systems  Constitutional: Positive for fever and  unexpected weight change.       With the diverticulitis  HENT: Negative.  Negative for ear pain, nosebleeds, congestion, sore throat, rhinorrhea, sneezing, trouble swallowing, dental problem, postnasal drip and sinus pressure.   Eyes: Negative.  Negative for redness and itching.  Respiratory: Negative.  Negative for cough, chest tightness, shortness of breath and wheezing.   Cardiovascular: Positive for palpitations. Negative for leg swelling.  Gastrointestinal: Negative.  Negative for nausea and vomiting.  Genitourinary: Negative.  Negative for dysuria.  Musculoskeletal: Negative.  Negative for joint swelling.  Skin: Negative for rash.  Neurological: Negative.  Negative for headaches.  Hematological: Negative.  Does not bruise/bleed easily.  Psychiatric/Behavioral: Negative.  Negative for dysphoric mood. The patient is not nervous/anxious.        Objective:   Physical Exam Constitutional:  Obese male, no acute distress  HENT:  Nares patent without discharge, but septal deviation to the right with narrowing.  Oropharynx without exudate, palate and uvula are thickened and elongated.   Eyes:  Perrla, eomi, no scleral icterus  Neck:  No JVD, no TMG  Cardiovascular:  Normal rate, regular rhythm, no rubs or gallops.  No murmurs        Intact distal pulses  Pulmonary :  Normal breath sounds, no stridor or respiratory distress   No rales, rhonchi, or wheezing  Abdominal:  Soft, nondistended, bowel  sounds present.  No tenderness noted.   Musculoskeletal:  No lower extremity edema noted.  Lymph Nodes:  No cervical lymphadenopathy noted  Skin:  No cyanosis noted  Neurologic:  Alert, appropriate, moves all 4 extremities without obvious deficit.         Assessment & Plan:

## 2011-11-17 NOTE — Patient Instructions (Signed)
Take the next 3 mos to work on weight loss.  If you are not making progress, or feel your symptoms are getting worse, let me know and we will start cpap. Please let your anesthesiologist for your upcoming surgery know that you have moderate sleep apnea.

## 2011-11-17 NOTE — Telephone Encounter (Signed)
Pt showed up in lobby asking questions about diverticulitis. Pt sees surgeon 11/24/11 for consult. Spoke with Mike Gip PA and she reviewed his CT scan, states mild sigmoid diverticulitis. States could take a couple of weeks to get over this. Instructed pt to follow a low roughage diet and to finish all his antibx. Pt instructed to call back if he developed a fever or the pain got worse. Pt verbalized understanding. Per Mike Gip PA pt should be seen next week. Pt scheduled to see Dr. Arlyce Dice 11/23/11@11 :15am. Pt aware of appt date and time.

## 2011-11-17 NOTE — Assessment & Plan Note (Signed)
The patient has moderate obstructive sleep apnea by his recent sleep testing.  He clearly has symptoms at night and during the day which are impacting his quality of life, but does not have underlying comorbid medical conditions that can be significantly impacted by his sleep apnea.  I have reviewed the pathophysiology of sleep apnea with him, including the various treatment options of his disease.  I have discussed a trial of weight loss alone, upper airway surgery, dental appliance, and also CPAP.  The patient feels that he is very motivated to lose weight, and would like to take the next 3 months to see how much progress he can make.  If he does not lose weight, or if his symptoms worsen, he is more than willing to try CPAP at that time.  He is to call me at 3 months to give me some idea as to his progress.

## 2011-11-22 ENCOUNTER — Encounter: Payer: Self-pay | Admitting: Pulmonary Disease

## 2011-11-23 ENCOUNTER — Ambulatory Visit (INDEPENDENT_AMBULATORY_CARE_PROVIDER_SITE_OTHER): Payer: 59 | Admitting: Gastroenterology

## 2011-11-23 ENCOUNTER — Encounter: Payer: Self-pay | Admitting: Gastroenterology

## 2011-11-23 ENCOUNTER — Encounter (HOSPITAL_COMMUNITY): Payer: Self-pay

## 2011-11-23 ENCOUNTER — Telehealth: Payer: Self-pay

## 2011-11-23 ENCOUNTER — Inpatient Hospital Stay (HOSPITAL_COMMUNITY)
Admission: AD | Admit: 2011-11-23 | Discharge: 2011-11-28 | DRG: 392 | Disposition: A | Discharge status: Home / Self Care | Payer: 59 | Source: Ambulatory Visit | Attending: Gastroenterology | Admitting: Gastroenterology

## 2011-11-23 ENCOUNTER — Inpatient Hospital Stay (HOSPITAL_COMMUNITY): Payer: 59

## 2011-11-23 VITALS — BP 120/74 | HR 80 | Temp 98.9°F | Ht 69.0 in | Wt 254.0 lb

## 2011-11-23 DIAGNOSIS — K5732 Diverticulitis of large intestine without perforation or abscess without bleeding: Principal | ICD-10-CM

## 2011-11-23 DIAGNOSIS — K5792 Diverticulitis of intestine, part unspecified, without perforation or abscess without bleeding: Secondary | ICD-10-CM

## 2011-11-23 DIAGNOSIS — R011 Cardiac murmur, unspecified: Secondary | ICD-10-CM | POA: Diagnosis present

## 2011-11-23 DIAGNOSIS — E785 Hyperlipidemia, unspecified: Secondary | ICD-10-CM | POA: Diagnosis present

## 2011-11-23 DIAGNOSIS — F319 Bipolar disorder, unspecified: Secondary | ICD-10-CM | POA: Diagnosis present

## 2011-11-23 DIAGNOSIS — Z79899 Other long term (current) drug therapy: Secondary | ICD-10-CM

## 2011-11-23 DIAGNOSIS — Z860100 Personal history of colon polyps, unspecified: Secondary | ICD-10-CM

## 2011-11-23 DIAGNOSIS — R197 Diarrhea, unspecified: Secondary | ICD-10-CM

## 2011-11-23 LAB — DIFFERENTIAL
Basophils Absolute: 0 10*3/uL (ref 0.0–0.1)
Basophils Relative: 0 % (ref 0–1)
Monocytes Absolute: 0.6 10*3/uL (ref 0.1–1.0)
Neutro Abs: 8.5 10*3/uL — ABNORMAL HIGH (ref 1.7–7.7)
Neutrophils Relative %: 85 % — ABNORMAL HIGH (ref 43–77)

## 2011-11-23 LAB — COMPREHENSIVE METABOLIC PANEL
ALT: 28 U/L (ref 0–53)
Alkaline Phosphatase: 40 U/L (ref 39–117)
BUN: 17 mg/dL (ref 6–23)
CO2: 27 mEq/L (ref 19–32)
GFR calc Af Amer: 76 mL/min — ABNORMAL LOW (ref >90)
GFR calc non Af Amer: 65 mL/min — ABNORMAL LOW (ref >90)
Glucose, Bld: 87 mg/dL (ref 70–99)
Potassium: 4.4 mEq/L (ref 3.5–5.1)
Sodium: 133 mEq/L — ABNORMAL LOW (ref 135–145)
Total Bilirubin: 0.7 mg/dL (ref 0.3–1.2)

## 2011-11-23 LAB — APTT: aPTT: 27 seconds (ref 24–37)

## 2011-11-23 LAB — CBC
MCHC: 35.3 g/dL (ref 30.0–36.0)
Platelets: 182 10*3/uL (ref 150–400)
RDW: 12.7 % (ref 11.5–15.5)
WBC: 10 10*3/uL (ref 4.0–10.5)

## 2011-11-23 LAB — PROTIME-INR
INR: 0.96 (ref 0.00–1.49)
Prothrombin Time: 13 seconds (ref 11.6–15.2)

## 2011-11-23 LAB — CARDIAC PANEL(CRET KIN+CKTOT+MB+TROPI): Total CK: 96 U/L (ref 7–232)

## 2011-11-23 MED ORDER — SODIUM CHLORIDE 0.9 % IV SOLN
1.5000 g | Freq: Four times a day (QID) | INTRAVENOUS | Status: DC
  Administered 2011-11-23 – 2011-11-28 (×19): 1.5 g via INTRAVENOUS
  Filled 2011-11-23 (×21): qty 1.5

## 2011-11-23 MED ORDER — SODIUM CHLORIDE 0.9 % IV SOLN
INTRAVENOUS | Status: DC
  Administered 2011-11-23 – 2011-11-24 (×2): via INTRAVENOUS

## 2011-11-23 MED ORDER — DIPHENHYDRAMINE HCL 50 MG/ML IJ SOLN
INTRAMUSCULAR | Status: AC
  Administered 2011-11-23: 16:00:00
  Filled 2011-11-23: qty 1

## 2011-11-23 MED ORDER — OXYCODONE-ACETAMINOPHEN 5-325 MG PO TABS
1.0000 | ORAL_TABLET | Freq: Four times a day (QID) | ORAL | Status: DC | PRN
  Administered 2011-11-23 – 2011-11-24 (×2): 2 via ORAL
  Administered 2011-11-24 (×3): 1 via ORAL
  Administered 2011-11-25: 2 via ORAL
  Administered 2011-11-25: 1 via ORAL
  Administered 2011-11-26 – 2011-11-27 (×2): 2 via ORAL
  Filled 2011-11-23: qty 1
  Filled 2011-11-23 (×2): qty 2
  Filled 2011-11-23: qty 1
  Filled 2011-11-23: qty 2
  Filled 2011-11-23: qty 1
  Filled 2011-11-23: qty 2
  Filled 2011-11-23: qty 1
  Filled 2011-11-23 (×2): qty 2

## 2011-11-23 MED ORDER — ZOLPIDEM TARTRATE 5 MG PO TABS
5.0000 mg | ORAL_TABLET | Freq: Every evening | ORAL | Status: DC | PRN
  Administered 2011-11-24: 5 mg via ORAL
  Filled 2011-11-23: qty 1

## 2011-11-23 MED ORDER — HYDROMORPHONE HCL PF 1 MG/ML IJ SOLN
1.0000 mg | INTRAMUSCULAR | Status: DC | PRN
  Administered 2011-11-23: 1 mg via INTRAVENOUS
  Filled 2011-11-23: qty 1

## 2011-11-23 MED ORDER — PNEUMOCOCCAL VAC POLYVALENT 25 MCG/0.5ML IJ INJ
0.5000 mL | INJECTION | INTRAMUSCULAR | Status: AC
  Administered 2011-11-24: 0.5 mL via INTRAMUSCULAR
  Filled 2011-11-23: qty 0.5

## 2011-11-23 MED ORDER — ACETAMINOPHEN 325 MG PO TABS
650.0000 mg | ORAL_TABLET | ORAL | Status: DC | PRN
  Administered 2011-11-23 – 2011-11-26 (×2): 650 mg via ORAL
  Filled 2011-11-23 (×2): qty 2

## 2011-11-23 MED ORDER — ONDANSETRON HCL 4 MG/2ML IJ SOLN
4.0000 mg | Freq: Four times a day (QID) | INTRAMUSCULAR | Status: DC | PRN
  Administered 2011-11-23: 4 mg via INTRAVENOUS
  Filled 2011-11-23: qty 2

## 2011-11-23 MED ORDER — DIPHENHYDRAMINE HCL 50 MG/ML IJ SOLN
25.0000 mg | Freq: Once | INTRAMUSCULAR | Status: AC
  Administered 2011-11-23: 25 mg via INTRAVENOUS

## 2011-11-23 MED ORDER — LAMOTRIGINE 100 MG PO TABS
100.0000 mg | ORAL_TABLET | Freq: Every evening | ORAL | Status: DC
  Administered 2011-11-23 – 2011-11-27 (×5): 100 mg via ORAL
  Filled 2011-11-23 (×6): qty 1

## 2011-11-23 MED ORDER — DIVALPROEX SODIUM ER 500 MG PO TB24
1000.0000 mg | ORAL_TABLET | Freq: Every evening | ORAL | Status: DC
  Administered 2011-11-23 – 2011-11-27 (×5): 1000 mg via ORAL
  Filled 2011-11-23 (×6): qty 2

## 2011-11-23 MED ORDER — CIPROFLOXACIN IN D5W 400 MG/200ML IV SOLN
400.0000 mg | Freq: Two times a day (BID) | INTRAVENOUS | Status: DC
  Administered 2011-11-23: 400 mg via INTRAVENOUS
  Filled 2011-11-23 (×2): qty 200

## 2011-11-23 MED ORDER — METRONIDAZOLE IN NACL 5-0.79 MG/ML-% IV SOLN
500.0000 mg | Freq: Three times a day (TID) | INTRAVENOUS | Status: DC
  Administered 2011-11-23: 500 mg via INTRAVENOUS
  Filled 2011-11-23 (×2): qty 100

## 2011-11-23 NOTE — Telephone Encounter (Signed)
Pt at [facility] Rm# [phone] Rito Ehrlich RN called and states that when the pts IV Cipro was started he started having a lot of mucous production and chest pain with a rash on his belly, Cipro was stopped. EKG was done that showed sinus tach, O2 sat and BP are stable. Fred RN calling for an order for Benadryl and anything else needed for pt. Willette Cluster NP aware and will go see pt. Fred RN aware.

## 2011-11-23 NOTE — Patient Instructions (Addendum)
You will be admitted to [facility] hospital today You will need to go to the admitting office at [facility]  Your room will be on [address]

## 2011-11-23 NOTE — H&P (Signed)
Primary Care Physician:  Willow Ora, MD Primary Gastroenterologist:  Melvia Heaps, MD  H&P cut and pasted from patient's office visit with Dr. Arlyce Dice today.    History of Present Illness: Mr. Carruthers returns today for evaluation of a abdominal pain. Since his last visit he's had increasingly severe left lower quadrant pain. CT scan on June 3, which I reviewed, demonstrated changes consistent with sigmoid diverticulitis. He received approximately 10 days of Cipro and Flagyl and then was changed to Augmentin. Pain continues. He has 2 loose stools daily. He is without fever or rectal bleeding. He's lost about 10 pounds   Past Medical History  Diagnosis Date  . Colon polyps     adenomatous  . Diverticulitis   . Hyperlipemia   . Depression   . Insomnia   . Heart murmur   . Arthritis   . Carpal tunnel syndrome     Past Surgical History  Procedure Date  . Maxillofacial surgery   . Right shoulder surgery   . Right knee arthroscopy     2007    Prior to Admission medications   Medication Sig Start Date End Date Taking? Authorizing Provider  amoxicillin-clavulanate (AUGMENTIN) 875-125 MG per tablet Take 1 tablet by mouth every 12 (twelve) hours. Pt started on 11-15-11 for 10 day therapy. Day 8 of therapy 11/15/11 11/25/11 Yes Dayton Bailiff, MD  atorvastatin (LIPITOR) 40 MG tablet Take 0.5 tablets (20 mg total) by mouth at bedtime. 10/14/11 10/13/12 Yes Wanda Plump, MD  divalproex (DEPAKOTE) 500 MG 24 hr tablet Take 1,000 mg by mouth every evening.    Yes Historical Provider, MD  lamoTRIgine (LAMICTAL) 100 MG tablet Take 100 mg by mouth every evening.    Yes Historical Provider, MD  OVER THE COUNTER MEDICATION Take 1 tablet by mouth 2 (two) times daily. rasberry ketones   Yes Historical Provider, MD  oxyCODONE-acetaminophen (PERCOCET) 5-325 MG per tablet Take 1-2 tablets by mouth every 6 (six) hours as needed for pain. 11/15/11 11/25/11 Yes Dayton Bailiff, MD  zolpidem (AMBIEN) 10 MG tablet Take 5 mg by mouth at  bedtime as needed. For sleep   Yes Historical Provider, MD    Current Facility-Administered Medications  Medication Dose Route Frequency Provider Last Rate Last Dose  . 0.9 %  sodium chloride infusion   Intravenous Continuous Louis Meckel, MD 100 mL/hr at 11/23/11 1418    . ampicillin-sulbactam (UNASYN) 1.5 g in sodium chloride 0.9 % 50 mL IVPB  1.5 g Intravenous Q6H Meredith Pel, NP      . diphenhydrAMINE (BENADRYL) 50 MG/ML injection           . diphenhydrAMINE (BENADRYL) injection 25 mg  25 mg Intravenous Once Meredith Pel, NP   25 mg at 11/23/11 1600  . divalproex (DEPAKOTE ER) 24 hr tablet 1,000 mg  1,000 mg Oral QPM Meredith Pel, NP      . lamoTRIgine (LAMICTAL) tablet 100 mg  100 mg Oral QPM Meredith Pel, NP      . ondansetron [facility]) injection 4 mg  4 mg Intravenous Q6H PRN Louis Meckel, MD   4 mg at 11/23/11 1500  . oxyCODONE-acetaminophen (PERCOCET) 5-325 MG per tablet 1-2 tablet  1-2 tablet Oral Q6H PRN Meredith Pel, NP      . pneumococcal 23 valent vaccine (PNU-IMMUNE) injection 0.5 mL  0.5 mL Intramuscular Tomorrow-1000 Mickel Baas, MD      . zolpidem [facility]) tablet 5 mg  5 mg  Oral QHS PRN Meredith Pel, NP      . DISCONTD: ciprofloxacin (CIPRO) IVPB 400 mg  400 mg Intravenous BID Louis Meckel, MD   400 mg at 11/23/11 1418  . DISCONTD: HYDROmorphone (DILAUDID) injection 1 mg  1 mg Intravenous Q3H PRN Louis Meckel, MD   1 mg at 11/23/11 1435  . DISCONTD: metroNIDAZOLE (FLAGYL) IVPB 500 mg  500 mg Intravenous Q8H Louis Meckel, MD   500 mg at 11/23/11 1620    Allergies as of 11/23/2011 - Review Complete 11/23/2011  Allergen Reaction Noted  . Ciprofloxacin Nausea And Vomiting, Rash, and Other (See Comments) 11/23/2011  . Dilaudid (hydromorphone hcl)  11/23/2011  . Hydrocodone      Family History  Problem Relation Age of Onset  . Prostate cancer Paternal Grandfather   . Cancer Paternal Aunt     lymph cancer    History   Social  History  . Marital Status: Married    Spouse Name: N/A    Number of Children: N/A  . Years of Education: N/A   Occupational History  . Software    Social History Main Topics  . Smoking status: Never Smoker   . Smokeless tobacco: Current User    Types: Snuff  . Alcohol Use: Yes     2-3 beers weekly  . Drug Use: No  . Sexually Active: No   Other Topics Concern  . Not on file   Social History Narrative  . No narrative on file   Review of Systems: Pertinent positive and negative review of systems were noted in the above HPI section. All other review of systems were otherwise negative.   Physical Exam: Vital signs in last 24 hours: Temp:  [98.9 F (37.2 C)-99.1 F (37.3 C)] 99.1 F (37.3 C) (06/12 1220) Pulse Rate:  [80-140] 140  (06/12 1220) Resp:  [20] 20  (06/12 1220) BP: (120-139)/(74-80) 139/78 mmHg (06/12 1510) SpO2:  [92 %-96 %] 96 % (06/12 1510) Weight:  [254 lb (115.214 kg)] 254 lb (115.214 kg) (06/12 1115)   General: Well developed , well nourished but ill appearing male  Head: Normocephalic and atraumatic  Eyes: sclerae anicteric, EOMI  Ears: Normal auditory acuity  Mouth: No deformity or lesions  Lungs: Clear throughout to auscultation  Heart: Regular rate and rhythm; no murmurs, rubs or bruits  Abdomen: Soft,and non distended. No masses, hepatosplenomegaly or hernias noted. Normal Bowel sounds. There is tenderness to palpation in left lower quadrant without guarding or rebound  Rectal:deferred  Musculoskeletal: Symmetrical with no gross deformities  Pulses: Normal pulses noted  Extremities: No clubbing, cyanosis, edema or deformities noted  Neurological: Alert oriented x 4, grossly nonfocal  Psychological: Alert and cooperative. Normal mood and affect    Impression / Plan:   1. Recurrent diverticulitis. Patient was to see surgeon outpatient to discuss colon resection. He is failing outpatient management in the interim. Patient will be admitted to  [facility] for bowel rest, IV fluids, IV antibiotics, analgegsics and an acute abdominal series. Hopefully symptoms will improve  And we can continue with outpatient plans. If not then will need to consult surgery this admission.   2. Bipolar disorder. Will continue home meds.     LOS: 0 days   Willette Cluster  11/23/2011, 5:07 PM

## 2011-11-23 NOTE — Significant Event (Addendum)
Rapid Response Event Note  Overview: Time Called: 1510 Arrival Time: 1515 Event Type: Cardiac;Respiratory  Initial Focused Assessment: chest pain 9/10 upon with severe mucous production, coughing and nausea. Cough productive thick yellow mucous.    Interventions: 12 lead EKG obtained, RN had oxygen , MD/ PA notified, See bedside RN note. Pharmacist consulted for possible adverse medication reaction. Question Cipro reaction.   Event Summary: Cipro stopped, VS monitered, O2 sat 96% on 2L. Benadryl administered.    at      at          [facility], Ferd Hibbs

## 2011-11-23 NOTE — Plan of Care (Signed)
Pt developed sternal chest tightness after Cipro iv started. Rapid response nurse to bedside. EKG showed sinus tach with LAD. Pt remained responsive throughout episode, but weith continuous mucous production causing nausea. Now has begun developing rash. BP 135/91, HR 118, sat 97% on 2L O2. Now with pain at level "3".  Awaiting response from MD.

## 2011-11-23 NOTE — Assessment & Plan Note (Signed)
He has had a week's of acute diverticulitis that has not significantly improved and may have worsened on oral antibiotics.  Recommendations #1 admit to hospital for parenteral antibiotics #2 acute abdominal series #3 bowel rest #4 postpone colonoscopy that was scheduled for next week #5 patient was scheduled to see Dr. Derrell Lolling for consideration of elective resection. This will be scheduled following his hospitalization.

## 2011-11-23 NOTE — Progress Notes (Signed)
History of Present Illness:  Harry Carroll returns today for evaluation of a abdominal pain. Since his last visit he's had increasingly severe left lower quadrant pain. CT scan on June 3, which I reviewed, demonstrated changes consistent with sigmoid diverticulitis. He received approximately 10 days of Cipro and Flagyl and then was changed to Augmentin. Pain continues. He has 2 loose stools daily. He is without fever or rectal bleeding.  He's lost about 10 pounds.    Review of Systems: Pertinent positive and negative review of systems were noted in the above HPI section. All other review of systems were otherwise negative.    Current Medications, Allergies, Past Medical History, Past Surgical History, Family History and Social History were reviewed in [employer] electronic medical record  Vital signs were reviewed in today's medical record. Physical Exam: General: Well developed , well nourished but ill appearing male Head: Normocephalic and atraumatic Eyes:  sclerae anicteric, EOMI Ears: Normal auditory acuity Mouth: No deformity or lesions Lungs: Clear throughout to auscultation Heart: Regular rate and rhythm; no murmurs, rubs or bruits Abdomen: Soft,and non distended. No masses, hepatosplenomegaly or hernias noted. Normal Bowel sounds. There is tenderness to palpation in left lower quadrant without guarding or rebound Rectal:deferred Musculoskeletal: Symmetrical with no gross deformities  Pulses:  Normal pulses noted Extremities: No clubbing, cyanosis, edema or deformities noted Neurological: Alert oriented x 4, grossly nonfocal Psychological:  Alert and cooperative. Normal mood and affect

## 2011-11-23 NOTE — Assessment & Plan Note (Signed)
This is probably antibiotic-related. Pseudomembranous colitis should be ruled out.  Recommendations #1 check stools for C. difficile toxin

## 2011-11-23 NOTE — Significant Event (Signed)
Rapid Response Event Note  Overview: Time Called: 1510 Arrival Time: 1515 Event Type: Cardiac;Respiratory  Initial Focused Assessment:   Interventions:   Event Summary: Chest pain completely resolved after benadryl. Respirations regular nonlabored, sat 96%.   at      at          [facility], [place]

## 2011-11-24 ENCOUNTER — Encounter (INDEPENDENT_AMBULATORY_CARE_PROVIDER_SITE_OTHER): Payer: 59 | Admitting: General Surgery

## 2011-11-24 ENCOUNTER — Telehealth (INDEPENDENT_AMBULATORY_CARE_PROVIDER_SITE_OTHER): Payer: Self-pay | Admitting: General Surgery

## 2011-11-24 DIAGNOSIS — K5732 Diverticulitis of large intestine without perforation or abscess without bleeding: Principal | ICD-10-CM

## 2011-11-24 NOTE — Telephone Encounter (Signed)
Pt calling to reschedule today's appt, because he is in the hospital now with fever and diverticulitis flare-up.  Rescheduled appt for 01/02/12 at 11:45.

## 2011-11-24 NOTE — Progress Notes (Signed)
[facility] Progress Note   Subjective: Fever to 101.3 yesterday evening.  Blood cultures drawn.  Had a reaction to either IV cipro or IV dilaudid yesterday, resolved with benedryl.  Today feeling a bit better but on percocet   Vital signs in last 24 hours:  Filed Vitals:   11/23/11 1510 11/23/11 2031 11/23/11 2300 11/24/11 0536  BP: 139/78 115/69  115/69  Pulse:  97  76  Temp:  101.3 F (38.5 C) 99 F (37.2 C) 98.7 F (37.1 C)  TempSrc:  Oral Oral Oral  Resp:  18  16  Height:      Weight:      SpO2: 96% 93%  93%    Physical Exam: General: alert and oriented times 3 Heart: regular rate and rythm Abdomen: soft, mild lefgt sided abd tenderness, non-distended, normal bowel sounds    Lab Results:  Basic Metabolic Panel:    Component Value Date/Time   NA 133* 11/23/2011 1825   K 4.4 11/23/2011 1825   CL 97 11/23/2011 1825   CO2 27 11/23/2011 1825   BUN 17 11/23/2011 1825   CREATININE 1.24 11/23/2011 1825   GLUCOSE 87 11/23/2011 1825   GLUCOSE 102* 03/30/2006 0906   CALCIUM 8.7 11/23/2011 1825   Liver Funtion Tests:    Component Value Date/Time   PROT 6.4 11/23/2011 1825   ALBUMIN 3.6 11/23/2011 1825   AST 18 11/23/2011 1825   ALT 28 11/23/2011 1825   ALKPHOS 40 11/23/2011 1825   BILITOT 0.7 11/23/2011 1825   BILIDIR 0.1 10/28/2010 0901   CBC:    Component Value Date/Time   WBC 10.0 11/23/2011 1825   HGB 15.4 11/23/2011 1825   HCT 43.6 11/23/2011 1825   PLT 182 11/23/2011 1825   MCV 88.6 11/23/2011 1825   NEUTROABS 8.5* 11/23/2011 1825   LYMPHSABS 0.8 11/23/2011 1825   MONOABS 0.6 11/23/2011 1825   EOSABS 0.1 11/23/2011 1825   BASOSABS 0.0 11/23/2011 1825   INR:    Component Value Date/Time   INR 0.96 11/23/2011 1825     Studies/Results: Dg Abd Acute W/chest  11/23/2011  *RADIOLOGY REPORT*  Clinical Data: Abdominal pain, nausea, vomiting and diarrhea.  ACUTE ABDOMEN SERIES (ABDOMEN 2 VIEW & CHEST 1 VIEW)  Comparison: CT abdomen and pelvis 11/15/2011 and  chest and two views abdomen 01/09/2010.  Findings: Single view of the chest demonstrates clear lungs and normal heart size.  No pneumothorax or pleural fluid.  Two views of the abdomen show no free intraperitoneal air.  Bowel gas pattern is unremarkable.  IMPRESSION: Negative exam.  Original Report Authenticated By: Bernadene Bell. Maricela Curet, M.D.    Medications: Scheduled Meds:   . ampicillin-sulbactam (UNASYN) IV  1.5 g Intravenous Q6H  . diphenhydrAMINE      . diphenhydrAMINE  25 mg Intravenous Once  . divalproex  1,000 mg Oral QPM  . lamoTRIgine  100 mg Oral QPM  . pneumococcal 23 valent vaccine  0.5 mL Intramuscular Tomorrow-1000  . DISCONTD: ciprofloxacin  400 mg Intravenous BID  . DISCONTD: metronidazole  500 mg Intravenous Q8H   Continuous Infusions:   . sodium chloride 100 mL/hr at 11/23/11 1418   PRN Meds:.acetaminophen, ondansetron, oxyCODONE-acetaminophen, zolpidem, DISCONTD:  HYDROmorphone (DILAUDID) injection   Assessment/Plan: 51 y.o. male with acute diverticulitis  Continue unasyn IV.  Will start clears.    Rob Bunting, MD  11/24/2011, 8:25 AM [facility] Pager [phone]

## 2011-11-25 ENCOUNTER — Telehealth: Payer: Self-pay | Admitting: *Deleted

## 2011-11-25 NOTE — Telephone Encounter (Signed)
Message copied by Marlowe Kays on Fri Nov 25, 2011  9:49 AM ------      Message from: Melvia Heaps D      Created: Thu Nov 24, 2011  5:06 PM      Regarding: RE: PROCEDURE       No      ----- Message -----         From: Marlowe Kays, CMA         Sent: 11/24/2011  11:24 AM           To: Louis Meckel, MD      Subject: PROCEDURE                                                WE ADMIITED Kern, WILL HE STILL CONTINUE WITH HIS SCHEDULED PROCEDURE WITH YOU ON NEXT WEEK?

## 2011-11-25 NOTE — Telephone Encounter (Signed)
Message copied by Marlowe Kays on Fri Nov 25, 2011  4:21 PM ------      Message from: Melvia Heaps D      Created: Thu Nov 24, 2011  5:06 PM      Regarding: RE: PROCEDURE       No      ----- Message -----         From: Marlowe Kays, CMA         Sent: 11/24/2011  11:24 AM           To: Louis Meckel, MD      Subject: PROCEDURE                                                WE ADMIITED Harry Carroll, WILL HE STILL CONTINUE WITH HIS SCHEDULED PROCEDURE WITH YOU ON NEXT WEEK?

## 2011-11-25 NOTE — Telephone Encounter (Signed)
AMY TO INFORM PATIENT WHILE HE IS IN HOSPITAL

## 2011-11-25 NOTE — Progress Notes (Signed)
[facility] Progress Note    Subjective: Still with abd pain, lower abd mid to left side.  Eating well.  Ambulating.  NO nausea or vomiting.  No more fevers  Objective: Vital signs in last 24 hours: Temp:  [98.4 F (36.9 C)-99.1 F (37.3 C)] 98.4 F (36.9 C) (06/14 0500) Pulse Rate:  [74-91] 74  (06/14 0500) Resp:  [18] 18  (06/14 0500) BP: (109-122)/(67-69) 109/67 mmHg (06/14 0500) SpO2:  [91 %-93 %] 92 % (06/14 0500) Last BM Date: 11/24/11 General: alert and oriented times 3 Heart: regular rate and rythm Abdomen: soft, mild to moderatedly tender lower abd, non-distended, normal bowel sounds   Lab Results:  [facility] 11/23/11 1825  WBC 10.0  HGB 15.4  PLT 182  MCV 88.6    Basename 11/23/11 1825  NA 133*  K 4.4  CL 97  CO2 27  GLUCOSE 87  BUN 17  CREATININE 1.24  CALCIUM 8.7    Basename 11/23/11 1825  PROT 6.4  ALBUMIN 3.6  AST 18  ALT 28  ALKPHOS 40  BILITOT 0.7  BILIDIR --  IBILI --    Basename 11/23/11 1825  INR 0.96    Medications: Scheduled Meds:   . ampicillin-sulbactam (UNASYN) IV  1.5 g Intravenous Q6H  . divalproex  1,000 mg Oral QPM  . lamoTRIgine  100 mg Oral QPM  . pneumococcal 23 valent vaccine  0.5 mL Intramuscular Tomorrow-1000   Continuous Infusions:   . DISCONTD: sodium chloride Stopped (11/24/11 1920)   PRN Meds:.acetaminophen, ondansetron, oxyCODONE-acetaminophen, zolpidem    Assessment/Plan: 51 y.o. male sigmoid diverticulitis  On day #2-3 of IV abx (unasyn).  Fevers have relented. Still with pain, tenderness.  Will want to stay on IV abx until pain, tenderness has improved more.  Does not require IV pain meds however.    Rob Bunting, MD  11/25/2011, 8:42 AM [facility] Pager [phone]

## 2011-11-25 NOTE — Telephone Encounter (Signed)
iNFORMED AMY SHE WILL INFORM THE PT WHILE HE IS THE HOSPITAL

## 2011-11-26 LAB — CBC
HCT: 39.1 % (ref 39.0–52.0)
Hemoglobin: 13.7 g/dL (ref 13.0–17.0)
MCH: 31.3 pg (ref 26.0–34.0)
MCHC: 35 g/dL (ref 30.0–36.0)
RDW: 12.7 % (ref 11.5–15.5)

## 2011-11-26 NOTE — Progress Notes (Signed)
Patient ID: Harry Carroll, male   DOB: [birth year], 51 y.o.   MRN: [id number] [facility] Progress Note  Subjective: Feeling pretty good, not requiring any pain meds. Still tender but definitely improved Day # 3 IV Unasyn  Objective:  Vital signs in last 24 hours: Temp:  [97.8 F (36.6 C)-98.8 F (37.1 C)] 97.8 F (36.6 C) (06/15 0628) Pulse Rate:  [58-73] 58  (06/15 0628) Resp:  [16-17] 16  (06/15 0628) BP: (118-125)/(77-83) 120/83 mmHg (06/15 0628) SpO2:  [94 %-96 %] 95 % (06/15 0628) Last BM Date: 11/24/11 General:   Alert,  Well-developed,    in NAD Heart:  Regular rate and rhythm; no murmurs Pulm;clear Abdomen:  Soft, tender llq, no guarding and nondistended. Normal bowel sounds Extremities:  Without edema. Neurologic:  Alert and  oriented x4;  grossly normal neurologically. Psych:  Alert and cooperative. Normal mood and affect.  Intake/Output from previous day: 06/14 0701 - 06/15 0700 In: 720 [P.O.:720] Out: 200 [Urine:200] Intake/Output this shift:    Lab Results:  Basename 11/26/11 0359 11/23/11 1825  WBC 5.1 10.0  HGB 13.7 15.4  HCT 39.1 43.6  PLT 184 182   BMET  Basename 11/23/11 1825  NA 133*  K 4.4  CL 97  CO2 27  GLUCOSE 87  BUN 17  CREATININE 1.24  CALCIUM 8.7   LFT  Basename 11/23/11 1825  PROT 6.4  ALBUMIN 3.6  AST 18  ALT 28  ALKPHOS 40  BILITOT 0.7  BILIDIR --  IBILI --   PT/INR  Basename 11/23/11 1825  LABPROT 13.0  INR 0.96   Hepatitis Panel No results found for this basename: HEPBSAG,HCVAB,HEPAIGM,HEPBIGM in the last 72 hours  Assessment / Plan: #1 51 yo male with recurrent sigmoid diverticulitis- will plan 5 days of Iv Unasyn, then home on Augmentin and Flagyl I spoke to surgery- suggest leaving him on abx until he has surgery- will get him in with Dr. Derrell Lolling shortly after discharge to expedite scheduling Active Problems:  * No active hospital problems. *      LOS: 3 days   Amy Esterwood  11/26/2011,  8:42 AM    ________________________________________________________________________  [facility] MD note:  I personally examined the patient, reviewed the data and agree with the assessment and plan described above.  Improving, will complete another 24-48 hours IV abx before letting him go home on PO abx until seen by surgery (hopefully next week).   Rob Bunting, MD [facility] Pager [phone]

## 2011-11-27 DIAGNOSIS — K5792 Diverticulitis of intestine, part unspecified, without perforation or abscess without bleeding: Secondary | ICD-10-CM

## 2011-11-27 NOTE — Progress Notes (Signed)
Patient ID: Harry Carroll, male   DOB: 12-02-1960, 51 y.o.   MRN: [id number] [facility] Progress Note  Subjective: Feeling gradually better- still fells uncomforatble after eating- having BM's- not using any pain meds  Objective:  Vital signs in last 24 hours: Temp:  [97.9 F (36.6 C)-98.7 F (37.1 C)] 97.9 F (36.6 C) (06/16 0657) Pulse Rate:  [59-68] 59  (06/16 0657) Resp:  [16-20] 18  (06/16 0657) BP: (114-125)/(71-82) 116/74 mmHg (06/16 0657) SpO2:  [96 %-97 %] 96 % (06/16 0657) Last BM Date: 11/26/11 General:   Alert,  Well-developed,    in NAD Heart:  Regular rate and rhythm; no murmurs Pulm;clear Abdomen:  Soft, tender LLQ, no guarding, and nondistended. Normal bowel sounds, Extremities:  Without edema. Neurologic:  Alert and  oriented x4;  grossly normal neurologically. Psych:  Alert and cooperative. Normal mood and affect.  Intake/Output from previous day: 06/15 0701 - 06/16 0700 In: 580 [P.O.:480; IV Piggyback:100] Out: 800 [Urine:800] Intake/Output this shift:    Lab Results:  Basename 11/26/11 0359  WBC 5.1  HGB 13.7  HCT 39.1  PLT 184     Assessment / Plan:  #1  51 yo male with recurrent diverticulitis-with prolonged smoldering episode currently. Plan is to continue IV ABX at least 5 days and/or until no significant tenderness, then home on Augmentin and flagyl and continue po antibiotics until he has sigmoid resection. I spoke to surgery yesterday- Pt needs an appt with Dr. Derrell Lolling arranged prior to discharge, and within 2 weeks so can get scheduled for semi-elective resection.     LOS: 4 days   Amy Esterwood  11/27/2011, 8:45 AM    ________________________________________________________________________  [facility] MD note:  I personally examined the patient, reviewed the data and agree with the assessment and plan described above. He is on day #4 IV abx, pain improving but not gone.  Will continue another day then change to PO,  hopefully home tomorrow or Tuesday.  Plan will be for him to stay on oral abx until seen by general surgery in their office. We will make sure that appt is expedited (for this upcoming week hopefully).   Rob Bunting, MD [facility] Pager [phone]

## 2011-11-28 ENCOUNTER — Telehealth: Payer: Self-pay | Admitting: Gastroenterology

## 2011-11-28 MED ORDER — AMOXICILLIN-POT CLAVULANATE 875-125 MG PO TABS: 1.0000 | ORAL_TABLET | Freq: Two times a day (BID) | ORAL | Status: AC

## 2011-11-28 MED ORDER — OXYCODONE-ACETAMINOPHEN 5-325 MG PO TABS: 1.0000 | ORAL_TABLET | Freq: Four times a day (QID) | ORAL | Status: DC | PRN

## 2011-11-28 MED ORDER — METRONIDAZOLE 500 MG PO TABS: 500.0000 mg | ORAL_TABLET | Freq: Three times a day (TID) | ORAL | Status: AC

## 2011-11-28 NOTE — Telephone Encounter (Signed)
Spoke with patient's wife and she was trying to get the surgeon's visit moved up to this week. Patient was to go on vacation next week if surgeon says it is okay. She has gotten the appointment moved up to Wednesday.

## 2011-11-28 NOTE — Progress Notes (Addendum)
[facility] Progress Note  SUBJECTIVE: A little sore in LLQ today, otherwise okay. Eating solids OBJECTIVE:  Vital signs in last 24 hours: Temp:  [97.7 F (36.5 C)-98.9 F (37.2 C)] 97.7 F (36.5 C) (06/17 0522) Pulse Rate:  [64-82] 69  (06/17 0522) Resp:  [20] 20  (06/17 0522) BP: (115-132)/(73-84) 132/77 mmHg (06/17 0522) SpO2:  [97 %-99 %] 98 % (06/17 0522) Last BM Date: 11/27/11 General:    white male in NAD Heart:  Regular rate and rhythm Lungs: Respirations even and unlabored, lungs CTA bilaterally Abdomen:  Soft, nontender and nondistended. Normal bowel sounds. Extremities:  Without edema. Neurologic:  Alert and oriented,  grossly normal neurologically. Psych:  Cooperative. Normal mood and affect.   Lab Results:  Basename 11/26/11 0359  WBC 5.1  HGB 13.7  HCT 39.1  PLT 184    ASSESSMENT / PLAN:  Recurrent sigmoid diverticulitis, improved on IV antibiotics. He is stable for discharge. As planned will send him home on Augmentin and Flagyl. He will see [place] in the office next week at which time we will arrange for surgical evaluation.    LOS: 5 days   Willette Cluster  11/28/2011, 8:46 AM   As per Ms. Guenther's note - discharged today.  Iva Boop, MD, [facility]

## 2011-11-28 NOTE — Discharge Summary (Signed)
[facility] Discharge Summary  Name: Harry Carroll MRN: [id number] DOB: November 15, 1960 51 y.o. PCP:  Willow Ora, MD  Date of Admission: 11/23/2011 11:56 AM Date of Discharge: 11/28/2011 Attending Physician: No att. providers found Primary Gastroenterologist: Melvia Heaps, MD Discharging Physician: Stan Head, MD  Discharge Diagnosis: 1. Recurrent diverticulitis, improved. 2. Bipolar disorder, stable on medication 3. Medication reaction. Though he has taken both before, patient had severe reaction while getting Cipro and Dilaudid simultaneously   Consultations: None requested this admission.   GI Procedures: None this admission  History/Physical Exam:  See Admission H&P  Admission HPI:  Cut and pasted office note HPI done by Dr. Arlyce Dice on day of admission.  History of Present Illness: Harry Carroll returns today for evaluation of a abdominal pain. Since his last visit he's had increasingly severe left lower quadrant pain. CT scan on June 3, which I reviewed, demonstrated changes consistent with sigmoid diverticulitis. He received approximately 10 days of Cipro and Flagyl and then was changed to Augmentin. Pain continues. He has 2 loose stools daily. He is without fever or rectal bleeding. He's lost about 10 pounds   Hospital Course by problem list:  1. Recurrent diverticulitis. Patient was having severe LLQ pain despite 10 days of outpatient Cipro / Flagyl for documented diverticulitis. Patient  saw Dr. Arlyce Dice in the office 11/23/11 at which time the decision was made to admit him for intravenous antibiotics and pain control. Patient was admitted to a medical bed at [facility]. Plain abdominal films done on admission were negative for free air. Admission labs were remarkable. Patient was given IV Cipro and Flagyl. For pain we ordered IV Dilaudid. Patient developed chest pain and tachycardia while simultaneously receiving Cipro and Dilaudid (see #2). Antibiotics change to  Unasyn, pain medication changed to oxycodone. Patient improved over the following  2 days. Patient was discharged home on Augmentin and Flagyl which he would continue until seen outpatient by surgery 12/08/11.  2. possible medication reaction. While receiving first dose of IV Cipro patient also received IV dilaudid for pain. Within a few minutes of receiving both medications patient developed chest pain, excessive mucus production, coughing and nausea. Both medications were stopped, rapid response called. EKG showed sinus tachycardia, heart rate 117. Per rapid response protocol, cardiac enzymes were obtained and unremarkable.Patient was given Benadryl, he recovered quickly. Cause of reaction was not clear as patient had been taking oral Cipro at home for 10 days and he had gotten Dilaudid in emergency department before without incident. Cipro and  Dilaudid were both added to allergy list to be on the safe side. Though hydrocodone listed as allergy, patient had been taking oxycodone at home without problems so this was prescribed in place of Dilaudid. Patient had no further medication reactions.  3. Bipolar disorder, stable.on medications. No events this admission.   Discharge Vitals:  BP 132/77  Pulse 69  Temp 97.7 F (36.5 C) (Oral)  Resp 20  Ht 5\' 9"  (1.753 m)  Wt 254 lb (115.214 kg)  BMI 37.51 kg/m2  SpO2 98%  Disposition and follow-up:   HarryBryon R Carroll was discharged from [facility] in stable condition.    Follow-up Appointments: Discharge Orders    Future Appointments: Provider: Department: Dept Phone: Center:   12/08/2011 2:45 PM Lodema Pilot, DO [facility] [phone] None   03/02/2012 8:30 AM Wanda Plump, MD [facility] [phone] [facility]     Future Orders Please Complete By Expires   Diet - low sodium  heart healthy      Increase activity slowly         Discharge Medications: Medication List  As of 11/28/2011  1:06 PM   TAKE these medications          AMBIEN 10 MG tablet   Generic drug: zolpidem   Take 5 mg by mouth at bedtime as needed. For sleep      amoxicillin-clavulanate 875-125 MG per tablet   Commonly known as: AUGMENTIN   Take 1 tablet by mouth 2 (two) times daily.      atorvastatin 40 MG tablet   Commonly known as: LIPITOR   Take 0.5 tablets (20 mg total) by mouth at bedtime.      divalproex 500 MG 24 hr tablet   Commonly known as: DEPAKOTE ER   Take 1,000 mg by mouth every evening.      lamoTRIgine 100 MG tablet   Commonly known as: LAMICTAL   Take 100 mg by mouth every evening.      metroNIDAZOLE 500 MG tablet   Commonly known as: FLAGYL   Take 1 tablet (500 mg total) by mouth 3 (three) times daily.      OVER THE COUNTER MEDICATION   Take 1 tablet by mouth 2 (two) times daily. rasberry ketones      oxyCODONE-acetaminophen 5-325 MG per tablet   Commonly known as: PERCOCET   Take 1-2 tablets by mouth every 6 (six) hours as needed.            Signed: Willette Cluster 11/28/2011, 1:06 PM

## 2011-11-28 NOTE — Discharge Instructions (Addendum)
Low Fiber and Residue Restricted Diet A low fiber diet restricts foods that contain carbohydrates that are not digested in the small intestine. A diet containing about 10 g of fiber is considered low fiber. The diet needs to be individualized to suit patient tolerances and preferences and to avoid unnecessary restrictions. Generally, the foods emphasized in a low fiber diet have no skins or seeds. They may have been processed to remove bran, germ, or husks. Cooking may not necessarily eliminate the fiber. Cooking may, in fact, enable a greater quantity of fiber to be consumed in a lesser volume. Legumes and nuts are also restricted. The term low residue has also been used to describe low fiber diets, although the two are not the same. Residue refers to any substance that adds to bowel (colonic) contents, such as sloughed cells and intestinal bacteria, in addition to fiber. Residue-containing foods, prunes and prune juice, milk, and connective tissue from meats may also need to be eliminated. It is important to eliminate these foods during sudden (acute) attacks of inflammatory bowel disease, when there is a partial obstruction due to another reason, or when minimal fecal output is desired. When these problems are gone, a more normal diet may be used. PURPOSE  Prevent blockage of a partially obstructed or narrowed gastrointestinal tract.   Reduce stool weight and volume.   Slow the movement of waste.  WHEN IS THIS DIET USED?  Acute phase of Crohn's disease, ulcerative colitis, regional enteritis, or diverticulitis.   Narrowing (stenosis) of intestinal or esophageal tubes (lumina).   Transitional diet following surgery, injury (trauma), or illness.  ADEQUACY This diet is nutritionally adequate based on individual food choices according to the Recommended Dietary Allowances of the National Research Council. CHOOSING FOODS Check labels, especially on foods from the starch list. Often, dietary fiber  content is listed with the Nutrition Facts panel.  Breads and Starches  Allowed: White, French, and pita breads, plain rolls, buns, or sweet rolls, doughnuts, waffles, pancakes, bagels. Plain muffins, sweet breads, biscuits, matzoth. Flour. Soda, saltine, or graham crackers. Pretzels, rusks, melba toast, zwieback. Cooked cereals: cornmeal, farina, cream cereals. Dry cereals: refined corn, wheat, rice, and oat cereals (check label). Potatoes prepared any way without skins, refined macaroni, spaghetti, noodles, refined rice.   Avoid: Bread, rolls, or crackers made with whole-wheat, multigrains, rye, bran seeds, nuts, or coconut. Corn tortillas, table-shells. Corn chips, tortilla chips. Cereals containing whole-grains, multigrains, bran, coconut, nuts, or raisins. Cooked or dry oatmeal. Coarse wheat cereals, granola. Cereals advertised as "high fiber." Potato skins. Whole-grain pasta, wild or brown rice. Popcorn.  Vegetables  Allowed:  Strained tomato and vegetable juices. Fresh: tender lettuce, cucumber, cabbage, spinach, bean sprouts. Cooked, canned: asparagus, bean sprouts, cut green or wax beans, cauliflower, pumpkin, beets, mushrooms, olives, spinach, yellow squash, tomato, tomato sauce (no seeds), zucchini (peeled), turnips. Canned sweet potatoes. Small amounts of celery, onion, radish, and green pepper may be used. Keep servings limited to  cup.   Avoid: Fresh, cooked, or canned: artichokes, baked beans, beet greens, broccoli, Brussels sprouts, French-style green beans, corn, kale, legumes, peas, sweet potatoes. Cooked: green or red cabbage, spinach. Avoid large servings of any vegetables.  Fruit  Allowed:  All fruit juices except prune juice. Cooked or canned: apricots applesauce, cantaloupe, cherries, grapefruit, grapes, kiwi, mandarin oranges, peaches, pears, fruit cocktail, pineapple, plums, watermelon. Fresh: banana, grapes, cantaloupe, avocado, cherries, pineapple, grapefruit, kiwi,  nectarines, peaches, oranges, blueberries, plums. Keep servings limited to  cup or 1 piece.     Avoid: Fresh: apple with or without skin, apricots, mango, pears, raspberries, strawberries. Prune juice, stewed or dried prunes. Dried fruits, raisins, dates. Avoid large servings of all fresh fruits.  Meat and Meat Substitutes  Allowed:  Ground or well-cooked tender beef, ham, veal, lamb, pork, or poultry. Eggs, plain cheese. Fish, oysters, shrimp, lobster, other seafood. Liver, organ meats.   Avoid: Tough, fibrous meats with gristle. Peanut butter, smooth or chunky. Cheese with seeds, nuts, or other foods not allowed. Nuts, seeds, legumes, dried peas, beans, lentils.  Milk  Allowed:  All milk products except those not allowed. Milk and milk product consumption should be minimal when low residue is desired.   Avoid: Yogurt that contains nuts or seeds.  Soups and Combination Foods  Allowed:  Bouillon, broth, or cream soups made from allowed foods. Any strained soup. Casseroles or mixed dishes made with allowed foods.   Avoid: Soups made from vegetables that are not allowed or that contain other foods not allowed.  Desserts and Sweets  Allowed:  Plain cakes and cookies, pie made with allowed fruit, pudding, custard, cream pie. Gelatin, fruit, ice, sherbet, frozen ice pops. Ice cream, ice milk without nuts. Plain hard candy, honey, jelly, molasses, syrup, sugar, chocolate syrup, gumdrops, marshmallows.   Avoid: Desserts, cookies, or candies that contain nuts, peanut butter, or dried fruits. Jams, preserves with seeds, marmalade.  Fats and Oils  Allowed:  Margarine, butter, cream, mayonnaise, salad oils, plain salad dressings made from allowed foods. Plain gravy, crisp bacon without rind.   Avoid: Seeds, nuts, olives. Avocados.  Beverages  Allowed:  All, except those listed to avoid.   Avoid: Fruit juices with high pulp, prune juice.  Condiments  Allowed:  Ketchup, mustard, horseradish,  vinegar, cream sauce, cheese sauce, cocoa powder. Spices in moderation: allspice, basil, bay leaves, celery powder or leaves, cinnamon, cumin powder, curry powder, ginger, mace, marjoram, onion or garlic powder, oregano, paprika, parsley flakes, ground pepper, rosemary, sage, savory, tarragon, thyme, turmeric.   Avoid: Coconut, pickles.  SAMPLE MEAL PLAN The following menu is provided as a sample. Your daily menu plans will vary. Be sure to include a minimum of the following each day in order to provide essential nutrients for the adult:  Starch/Bread/Cereal Group, 6 servings.   Fruit/Vegetable Group, 5 servings.   Meat/Meat Substitute Group, 2 servings.   Milk/Milk Substitute Group, 2 servings.  A serving is equal to  cup for fruits, vegetables, and cooked cereals or 1 piece for foods such as a piece of bread, 1 orange, or 1 apple. For dry cereals and crackers, use serving sizes listed on the label. Combination foods may count as full or partial servings from various food groups. Fats, desserts, and sweets may be added to the meal plan after the requirements for essential nutrients are met. SAMPLE MENU Breakfast   cup orange juice.   1 boiled egg.   1 slice white toast.   Margarine.    cup cornflakes.   1 cup milk.   Beverage.  Lunch   cup chicken noodle soup.   2 to 3 oz sliced roast beef.   2 slices seedless rye bread.   Mayonnaise.    cup tomato juice.   1 small banana.   Beverage.  Dinner  3 oz baked chicken.    cup scalloped potatoes.    cup cooked beets.   White dinner roll.   Margarine.    cup canned peaches.   Beverage.  Document Released: 11/19/2001 Document Revised: 05/19/2011   Document Reviewed: 05/02/2011 [employer] Patient Information 2012 [employer]. 

## 2011-11-29 ENCOUNTER — Encounter: Payer: Self-pay | Admitting: Gastroenterology

## 2011-11-30 ENCOUNTER — Ambulatory Visit (INDEPENDENT_AMBULATORY_CARE_PROVIDER_SITE_OTHER): Payer: 59 | Admitting: Surgery

## 2011-11-30 ENCOUNTER — Encounter (INDEPENDENT_AMBULATORY_CARE_PROVIDER_SITE_OTHER): Payer: Self-pay | Admitting: Surgery

## 2011-11-30 ENCOUNTER — Encounter: Payer: 59 | Admitting: Gastroenterology

## 2011-11-30 ENCOUNTER — Telehealth: Payer: Self-pay | Admitting: Gastroenterology

## 2011-11-30 VITALS — BP 126/74 | HR 70 | Temp 96.4°F | Resp 14 | Ht 69.0 in | Wt 249.1 lb

## 2011-11-30 DIAGNOSIS — K5792 Diverticulitis of intestine, part unspecified, without perforation or abscess without bleeding: Secondary | ICD-10-CM

## 2011-11-30 DIAGNOSIS — K5732 Diverticulitis of large intestine without perforation or abscess without bleeding: Secondary | ICD-10-CM

## 2011-11-30 LAB — CULTURE, BLOOD (ROUTINE X 2)
Culture  Setup Time: 201306130152
Culture: NO GROWTH

## 2011-11-30 NOTE — Telephone Encounter (Signed)
Spoke with patient and he states he is scheduled for surgery with Dr. Gerrit Friends on 12/20/11. He was scheduled with Mike Gip, PA next week for OV post-hospital. He cannot make that appointment and wants to know if he really needs to reschedule before his surgery. Please, advise.

## 2011-11-30 NOTE — Progress Notes (Signed)
General Surgery [facility]  Chief Complaint  Patient presents with  . Diverticulitis    Recurrent diverticulitis - referral from Dr. Melvia Heaps, [facility]   HISTORY: The patient is a 51 year old white male referred by gastroenterology for persistent diverticulitis. Patient was initially diagnosed in 1997. He has had multiple episodes requiring outpatient management. Patient has recently required hospitalization for intravenous antibiotics. He developed pain and fever. CT scan documented sigmoid diverticulitis. The patient has undergone a previous colonoscopy approximately 4 years ago. He has not had any complications. He is currently taking oral antibiotics. Patient has had no prior abdominal surgery. Patient is referred to general surgery at this time for consideration for sigmoid colectomy for management of recurrent episodes of diverticulitis.  Past Medical History  Diagnosis Date  . Colon polyps     adenomatous  . Diverticulitis   . Hyperlipemia   . Depression   . Insomnia   . Heart murmur   . Arthritis   . Carpal tunnel syndrome      Current Outpatient Prescriptions  Medication Sig Dispense Refill  . amoxicillin-clavulanate (AUGMENTIN) 875-125 MG per tablet Take 1 tablet by mouth 2 (two) times daily.  28 tablet  1  . atorvastatin (LIPITOR) 40 MG tablet Take 0.5 tablets (20 mg total) by mouth at bedtime.  30 tablet  0  . divalproex (DEPAKOTE) 500 MG 24 hr tablet Take 1,000 mg by mouth every evening.       . lamoTRIgine (LAMICTAL) 100 MG tablet Take 100 mg by mouth every evening.       . metroNIDAZOLE (FLAGYL) 500 MG tablet Take 1 tablet (500 mg total) by mouth 3 (three) times daily.  30 tablet  1  . OVER THE COUNTER MEDICATION Take 1 tablet by mouth 2 (two) times daily. rasberry ketones      . oxyCODONE-acetaminophen (PERCOCET) 5-325 MG per tablet Take 1-2 tablets by mouth every 6 (six) hours as needed.  30 tablet  0  . zolpidem (AMBIEN) 10 MG tablet  Take 5 mg by mouth at bedtime as needed. For sleep         Allergies  Allergen Reactions  . Ciprofloxacin Shortness Of Breath, Nausea And Vomiting, Nausea Only, Rash and Other (See Comments)    Marked mucous production  . Dilaudid (Hydromorphone Hcl) Shortness Of Breath and Nausea Only    Patient developed rash, excessive mucous production and chest pain.  Marland Kitchen Hydrocodone Itching     Family History  Problem Relation Age of Onset  . Prostate cancer Paternal Grandfather   . Cancer Paternal Grandfather     prostate  . Cancer Paternal Aunt     lymph cancer     History   Social History  . Marital Status: Married    Spouse Name: N/A    Number of Children: N/A  . Years of Education: N/A   Occupational History  . Software    Social History Main Topics  . Smoking status: Never Smoker   . Smokeless tobacco: Current User    Types: Snuff  . Alcohol Use: No  . Drug Use: No  . Sexually Active: No   Other Topics Concern  . None   Social History Narrative  . None     REVIEW OF SYSTEMS - PERTINENT POSITIVES ONLY: Denies any change in bowel habits. Denies bleeding per rectum. Patient does note approximately 15 pounds weight loss in recent weeks.  EXAMCeasar Mons Vitals:   11/30/11 0909  BP: 126/74  Pulse: 70  Temp: 96.4 F (35.8 C)  Resp: 14    HEENT: normocephalic; pupils equal and reactive; sclerae clear; dentition good; mucous membranes moist NECK:  symmetric on extension; no palpable anterior or posterior cervical lymphadenopathy; no supraclavicular masses; no tenderness CHEST: clear to auscultation bilaterally without rales, rhonchi, or wheezes CARDIAC: regular rate and rhythm without significant murmur; peripheral pulses are full ABDOMEN: soft without distension; bowel sounds present; no mass; no hepatosplenomegaly; no hernia; Mild to moderate tenderness left lower quadrant with guarding; no palpable mass EXT:  non-tender without edema; no deformity NEURO: no gross  focal deficits; no sign of tremor   LABORATORY RESULTS: See [facility] ([facility]L-Epic) for most recent results   RADIOLOGY RESULTS: See [facility] (CHL-Epic) for most recent results   IMPRESSION: #1 sigmoid diverticulitis, recurrent #2 bipolar disorder #3 hyperlipidemia  PLAN: The patient I had a lengthy discussion regarding diverticular disease and its surgical management. We discussed risk and benefits of the procedure. We discussed the anticipated hospital stay and postoperative recovery. We discussed the possibility of colostomy placement. Patient understands and wishes to proceed. I provided him with written literature regarding the surgery. We will make arrangements for his procedure in the near future. I would like to treat him with oral antibiotics for approximately 3 weeks prior to surgery.  The risks and benefits of the procedure have been discussed at length with the patient.  The patient understands the proposed procedure, potential alternative treatments, and the course of recovery to be expected.  All of the patient's questions have been answered at this time.  The patient wishes to proceed with surgery.  Velora Heckler, MD, FACS General & Endocrine Surgery [facility]   Visit Diagnoses: 1. Diverticulitis   2. DIVERTICULITIS OF COLON     Primary Care Physician: Willow Ora, MD

## 2011-11-30 NOTE — Patient Instructions (Signed)
Take Miralax (store brand generic) once daily.  [facility], [place] Office: [phone]  OPEN ABDOMINAL SURGERY: POST OP INSTRUCTIONS  Always review your discharge instruction sheet given to you by the facility where your surgery was performed.  1. A prescription for pain medication may be given to you upon discharge.  Take your pain medication as prescribed, if needed.  If narcotic pain medicine is not needed, then you may take acetaminophen (Tylenol) or ibuprofen (Advil) as needed. 2. Take your usually prescribed medications unless otherwise directed. 3. If you need a refill on your pain medication, please contact your pharmacy. They will contact our office to request authorization.  Prescriptions will not be filled after 5pm or on weekends. 4. You should follow a light diet the first few days after arrival home, such as soup and crackers, unless your doctor has advised otherwise. A high-fiber, low fat diet can be resumed as tolerated.  Be sure to include lots of fluids daily.  5. Most patients will experience some swelling and bruising in the area of the incision. Ice packs will help. Swelling and bruising can take several days to resolve. 6. It is common to experience some constipation if taking pain medication after surgery.  Increasing fluid intake and taking a stool softener will usually help or prevent this problem from occurring.  A mild laxative (Milk of Magnesia or Miralax) should be taken according to package directions if there are no bowel movements after 48 hours. 7.  You may have steri-strips (small skin tapes) in place directly over the incision.  These strips should be left on the skin for 7-10 days.  If your surgeon used skin glue on the incision, you may shower in 24 hours.  The glue will flake off over the next 2-3 weeks.  Any sutures or staples will be removed at the office during your follow-up visit. You may find that a light gauze bandage over your incision may keep  your staples from being rubbed or pulled. You may shower and replace the bandage daily. 8. ACTIVITIES:  You may resume regular (light) daily activities beginning the next day--such as daily self-care, walking, climbing stairs--gradually increasing activities as tolerated.  You may have sexual intercourse when it is comfortable.  Refrain from any heavy lifting or straining until approved by your doctor.  You may drive when you no longer are taking prescription pain medication, you can comfortably wear a seatbelt, and you can safely maneuver your car and apply brakes. 9. You should see your doctor in the office for a follow-up appointment approximately two weeks after your surgery.  Make sure that you call for this appointment within a day or two after you arrive home to insure a convenient appointment time.  WHEN TO CALL YOUR DOCTOR: 1. Fever over 101.0 2. Inability to urinate 3. Nausea and/or vomiting 4. Extreme swelling or bruising 5. Continued bleeding from incision. 6. Increased pain, redness, or drainage from the incision. 7. Difficulty swallowing or breathing 8. Muscle cramping or spasms. 9. Numbness or tingling in hands or feet or around lips.  IF YOU HAVE DISABILITY OR FAMILY LEAVE FORMS, YOU MUST BRING THEM TO THE OFFICE FOR PROCESSING.  PLEASE DO NOT GIVE THEM TO YOUR DOCTOR.  The clinic staff is available to answer your questions during regular business hours.  Please don't hesitate to call and ask to speak to one of the nurses if you have concerns.  For further questions, please visit [email or web address]

## 2011-12-01 ENCOUNTER — Telehealth: Payer: Self-pay | Admitting: Internal Medicine

## 2011-12-01 NOTE — Telephone Encounter (Signed)
Ok to cancel if he's feeling ok

## 2011-12-01 NOTE — Telephone Encounter (Signed)
Caller: Harry Carroll/Patient; PCP: Willow Ora; CB#: [phone]; Calling today 12/01/11 regarding had a severe allergic reaction to medication while in hospital recently.  Was given Cipro and Dilaudid IV, does not know which medication caused the reaction.  Pt had severe chest pain, lots of increased mucus, was losing conciousness.  Hospital had to call in emergency response team.  Pt concerned b/c he is supposed to be going back into hospital on 12/20/11 for a bowel resection and wants to make sure that symptoms were not cardiac related, since he was having crushing chest pain.  Advised pt that severe anaphalaxis reactions can have similar symptoms, however, if he would like to come in for consultation with Dr. Drue Novel before his next scheduled surgery we can make appt for him.  Pt not having symptoms now.  Would like an appt for the week before his surgery.  Transferred call to [facility] in office to schedule appt.

## 2011-12-01 NOTE — Telephone Encounter (Signed)
Patient advised.

## 2011-12-01 NOTE — Telephone Encounter (Signed)
Agree, needs office visit this month

## 2011-12-01 NOTE — Telephone Encounter (Signed)
Pt scheduled for 12/14/11.

## 2011-12-02 NOTE — Discharge Summary (Signed)
Agree with Ms. Guenther's assessment and plan. Iyan Flett E. Torianne Laflam, MD, FACG   

## 2011-12-05 ENCOUNTER — Telehealth (INDEPENDENT_AMBULATORY_CARE_PROVIDER_SITE_OTHER): Payer: Self-pay | Admitting: General Surgery

## 2011-12-05 NOTE — Telephone Encounter (Signed)
Called and left message advising the patient to call our office with regard to revised appointment time.   Dr. Derrell Lolling has agreed to see this patient on 12/19/11 at 3:00, instead of waiting to see Dr. Gerrit Friends on 01/04/12. Patient had another episode of diverticulitis.

## 2011-12-06 ENCOUNTER — Ambulatory Visit: Payer: 59 | Admitting: Physician Assistant

## 2011-12-06 ENCOUNTER — Telehealth (INDEPENDENT_AMBULATORY_CARE_PROVIDER_SITE_OTHER): Payer: Self-pay | Admitting: General Surgery

## 2011-12-06 NOTE — Telephone Encounter (Signed)
Called back patient and advised that the appointment was made at the request of Dr. Derrell Lolling based on communication between himself and Dr. Gerrit Friends. I advised that I will discuss 12/19/11 appointment verses 12/20/11 surgery date and confirm with Dr. Gerrit Friends if he still wants him to see Dr. Derrell Lolling before scheduled surgery date. Also advised the patient that a bowel prep will need to be given to him before the surgery.  The patient stated that he is aware of the pre-op testing next week at the hospital. Patient gave permission to leave a message on his voicemail if unable to be reached tomorrow.

## 2011-12-07 ENCOUNTER — Encounter (HOSPITAL_COMMUNITY): Payer: Self-pay | Admitting: Pharmacy Technician

## 2011-12-07 ENCOUNTER — Telehealth (INDEPENDENT_AMBULATORY_CARE_PROVIDER_SITE_OTHER): Payer: Self-pay | Admitting: General Surgery

## 2011-12-07 NOTE — Telephone Encounter (Signed)
Called patient back after speaking with Dr. Gerrit Friends regarding sx scheduled for 12/20/11. He advised that he has already seen the patient and he does not need to keep the appointment with Dr. Derrell Lolling on 12/19/11. Information to be directed to Dr. Derrell Lolling as an Lorain Childes.

## 2011-12-08 ENCOUNTER — Encounter (INDEPENDENT_AMBULATORY_CARE_PROVIDER_SITE_OTHER): Payer: 59 | Admitting: General Surgery

## 2011-12-12 ENCOUNTER — Encounter (INDEPENDENT_AMBULATORY_CARE_PROVIDER_SITE_OTHER): Payer: 59 | Admitting: General Surgery

## 2011-12-13 ENCOUNTER — Encounter (HOSPITAL_COMMUNITY): Payer: Self-pay

## 2011-12-13 ENCOUNTER — Encounter (HOSPITAL_COMMUNITY)
Admission: RE | Admit: 2011-12-13 | Discharge: 2011-12-13 | Disposition: A | Discharge status: Home / Self Care | Payer: 59 | Source: Ambulatory Visit | Attending: Surgery | Admitting: Surgery

## 2011-12-13 HISTORY — DX: Sleep apnea, unspecified: G47.30

## 2011-12-13 HISTORY — DX: Restless legs syndrome: G25.81

## 2011-12-13 NOTE — Patient Instructions (Signed)
20 Harry Carroll  12/13/2011   Your procedure is scheduled on:  12/20/11  7:30 AM  Report to SHORT STAY DEPT  at 5:15 AM.  Call this number if you have problems the morning of surgery: [phone]   Remember:   Do not eat food or drink liquids AFTER MIDNIGHT    Take these medicines the morning of surgery with A SIP OF WATER: FLAGYL / PERCOCET IF NEEDED   Do not wear jewelry, make-up or nail polish.  Do not wear lotions, powders, or perfumes.   Do not shave legs or underarms 48 hrs. before surgery (men may shave face)  Do not bring valuables to the hospital.  Contacts, dentures or bridgework may not be worn into surgery.  Leave suitcase in the car. After surgery it may be brought to your room.  For patients admitted to the hospital, checkout time is 11:00 AM the day of discharge.   Patients discharged the day of surgery will not be allowed to drive home.    Special Instructions:   Please read over the following fact sheets that you were given: MRSA  Information               SHOWER WITH BETASEPT THE NIGHT BEFORE SURGERY AND THE MORNING OF SURGERY                FOLLOW BOWEL PREP FROM OFFICE

## 2011-12-14 ENCOUNTER — Telehealth (INDEPENDENT_AMBULATORY_CARE_PROVIDER_SITE_OTHER): Payer: Self-pay | Admitting: General Surgery

## 2011-12-14 ENCOUNTER — Ambulatory Visit: Payer: 59 | Admitting: Internal Medicine

## 2011-12-14 NOTE — Telephone Encounter (Signed)
Pt is calling to request pain med refill, as he will run out before his surgery next Tuesday.  Paged Dr. Gerrit Friends and updated; received order for Vicodin.  Called Hydrocodone 5/325 mg,  # 30, 1-2 po Q 4-6 H prn pain, no refill to [facility]:  [phone].

## 2011-12-19 ENCOUNTER — Encounter (INDEPENDENT_AMBULATORY_CARE_PROVIDER_SITE_OTHER): Payer: 59 | Admitting: General Surgery

## 2011-12-19 NOTE — Anesthesia Preprocedure Evaluation (Addendum)
Anesthesia Evaluation  Patient identified by MRN, date of birth, ID band Patient awake    Reviewed: Allergy & Precautions, H&P , NPO status , Patient's Chart, lab work & pertinent test results  Airway Mallampati: III TM Distance: >3 FB Neck ROM: full    Dental  (+) Chipped and Dental Advisory Given,    Pulmonary neg pulmonary ROS, sleep apnea ,  Mild OSA.  No CPAP breath sounds clear to auscultation  Pulmonary exam normal       Cardiovascular Exercise Tolerance: Good negative cardio ROS  Rhythm:regular Rate:Normal     Neuro/Psych negative neurological ROS  negative psych ROS   GI/Hepatic negative GI ROS, Neg liver ROS,   Endo/Other  negative endocrine ROS  Renal/GU negative Renal ROS  negative genitourinary   Musculoskeletal   Abdominal   Peds  Hematology negative hematology ROS (+)   Anesthesia Other Findings   Reproductive/Obstetrics negative OB ROS                          Anesthesia Physical Anesthesia Plan  ASA: II  Anesthesia Plan: General   Post-op Pain Management:    Induction: Intravenous  Airway Management Planned: Oral ETT  Additional Equipment:   Intra-op Plan:   Post-operative Plan: Extubation in OR  Informed Consent: I have reviewed the patients History and Physical, chart, labs and discussed the procedure including the risks, benefits and alternatives for the proposed anesthesia with the patient or authorized representative who has indicated his/her understanding and acceptance.   Dental Advisory Given  Plan Discussed with: CRNA and Surgeon  Anesthesia Plan Comments:         Anesthesia Quick Evaluation

## 2011-12-20 ENCOUNTER — Encounter (HOSPITAL_COMMUNITY): Payer: Self-pay | Admitting: Anesthesiology

## 2011-12-20 ENCOUNTER — Inpatient Hospital Stay (HOSPITAL_COMMUNITY)
Admission: RE | Admit: 2011-12-20 | Discharge: 2011-12-25 | DRG: 330 | Disposition: A | Discharge status: Home / Self Care | Payer: 59 | Source: Ambulatory Visit | Attending: Surgery | Admitting: Surgery

## 2011-12-20 ENCOUNTER — Encounter (HOSPITAL_COMMUNITY)
Admission: RE | Disposition: A | Discharge status: Home / Self Care | Payer: Self-pay | Source: Ambulatory Visit | Attending: Surgery

## 2011-12-20 ENCOUNTER — Encounter: Payer: 59 | Admitting: Gastroenterology

## 2011-12-20 ENCOUNTER — Ambulatory Visit (HOSPITAL_COMMUNITY): Payer: 59 | Admitting: Anesthesiology

## 2011-12-20 ENCOUNTER — Encounter (HOSPITAL_COMMUNITY): Payer: Self-pay

## 2011-12-20 DIAGNOSIS — K5732 Diverticulitis of large intestine without perforation or abscess without bleeding: Principal | ICD-10-CM | POA: Diagnosis present

## 2011-12-20 DIAGNOSIS — F319 Bipolar disorder, unspecified: Secondary | ICD-10-CM | POA: Diagnosis present

## 2011-12-20 DIAGNOSIS — Z860100 Personal history of colon polyps, unspecified: Secondary | ICD-10-CM

## 2011-12-20 DIAGNOSIS — N35919 Unspecified urethral stricture, male, unspecified site: Secondary | ICD-10-CM | POA: Diagnosis present

## 2011-12-20 DIAGNOSIS — K560 Paralytic ileus: Secondary | ICD-10-CM | POA: Diagnosis present

## 2011-12-20 DIAGNOSIS — E785 Hyperlipidemia, unspecified: Secondary | ICD-10-CM | POA: Diagnosis present

## 2011-12-20 DIAGNOSIS — Z79899 Other long term (current) drug therapy: Secondary | ICD-10-CM

## 2011-12-20 HISTORY — PX: PARTIAL COLECTOMY: SHX5273

## 2011-12-20 HISTORY — PX: CYSTOSCOPY WITH URETHRAL DILATATION: SHX5125

## 2011-12-20 LAB — CBC
HCT: 42.8 % (ref 39.0–52.0)
Hemoglobin: 15.5 g/dL (ref 13.0–17.0)
MCHC: 36.2 g/dL — ABNORMAL HIGH (ref 30.0–36.0)
RBC: 4.97 MIL/uL (ref 4.22–5.81)
WBC: 10.3 10*3/uL (ref 4.0–10.5)

## 2011-12-20 LAB — ABO/RH: ABO/RH(D): A POS

## 2011-12-20 LAB — CREATININE, SERUM
GFR calc Af Amer: 77 mL/min — ABNORMAL LOW (ref >90)
GFR calc non Af Amer: 67 mL/min — ABNORMAL LOW (ref >90)

## 2011-12-20 LAB — TYPE AND SCREEN
ABO/RH(D): A POS
Antibody Screen: NEGATIVE

## 2011-12-20 SURGERY — COLECTOMY, PARTIAL
Anesthesia: General | Site: Pelvis | Wound class: Clean Contaminated

## 2011-12-20 MED ORDER — SUCCINYLCHOLINE CHLORIDE 20 MG/ML IJ SOLN
INTRAMUSCULAR | Status: DC | PRN
  Administered 2011-12-20: 100 mg via INTRAVENOUS

## 2011-12-20 MED ORDER — DIPHENHYDRAMINE HCL 12.5 MG/5ML PO ELIX
12.5000 mg | ORAL_SOLUTION | Freq: Four times a day (QID) | ORAL | Status: DC | PRN
  Filled 2011-12-20: qty 5

## 2011-12-20 MED ORDER — ROCURONIUM BROMIDE 100 MG/10ML IV SOLN
INTRAVENOUS | Status: DC | PRN
  Administered 2011-12-20: 20 mg via INTRAVENOUS
  Administered 2011-12-20: 10 mg via INTRAVENOUS
  Administered 2011-12-20: 50 mg via INTRAVENOUS

## 2011-12-20 MED ORDER — FENTANYL CITRATE 0.05 MG/ML IJ SOLN
INTRAMUSCULAR | Status: AC
  Filled 2011-12-20: qty 2

## 2011-12-20 MED ORDER — ONDANSETRON HCL 4 MG/2ML IJ SOLN
4.0000 mg | Freq: Four times a day (QID) | INTRAMUSCULAR | Status: DC | PRN
  Administered 2011-12-21 – 2011-12-23 (×3): 4 mg via INTRAVENOUS
  Filled 2011-12-20 (×3): qty 2

## 2011-12-20 MED ORDER — SODIUM CHLORIDE 0.9 % IJ SOLN: 9.0000 mL | INTRAMUSCULAR | Status: DC | PRN

## 2011-12-20 MED ORDER — LACTATED RINGERS IV SOLN: INTRAVENOUS | Status: DC

## 2011-12-20 MED ORDER — MORPHINE SULFATE 10 MG/ML IJ SOLN
2.0000 mg | INTRAMUSCULAR | Status: DC | PRN
  Administered 2011-12-20 (×2): 2 mg via INTRAVENOUS

## 2011-12-20 MED ORDER — ALVIMOPAN 12 MG PO CAPS
ORAL_CAPSULE | ORAL | Status: AC
  Administered 2011-12-20: 12 mg via ORAL
  Filled 2011-12-20: qty 1

## 2011-12-20 MED ORDER — NEOSTIGMINE METHYLSULFATE 1 MG/ML IJ SOLN
INTRAMUSCULAR | Status: DC | PRN
  Administered 2011-12-20: 4 mg via INTRAVENOUS

## 2011-12-20 MED ORDER — 0.9 % SODIUM CHLORIDE (POUR BTL) OPTIME
TOPICAL | Status: DC | PRN
  Administered 2011-12-20: 2000 mL

## 2011-12-20 MED ORDER — ONDANSETRON HCL 4 MG PO TABS: 4.0000 mg | ORAL_TABLET | Freq: Four times a day (QID) | ORAL | Status: DC | PRN

## 2011-12-20 MED ORDER — GLYCOPYRROLATE 0.2 MG/ML IJ SOLN
INTRAMUSCULAR | Status: DC | PRN
  Administered 2011-12-20: .7 mg via INTRAVENOUS

## 2011-12-20 MED ORDER — MIDAZOLAM HCL 5 MG/5ML IJ SOLN
INTRAMUSCULAR | Status: DC | PRN
  Administered 2011-12-20: 2 mg via INTRAVENOUS

## 2011-12-20 MED ORDER — ONDANSETRON HCL 4 MG/2ML IJ SOLN: 4.0000 mg | Freq: Four times a day (QID) | INTRAMUSCULAR | Status: DC | PRN

## 2011-12-20 MED ORDER — DIPHENHYDRAMINE HCL 50 MG/ML IJ SOLN: 12.5000 mg | Freq: Four times a day (QID) | INTRAMUSCULAR | Status: DC | PRN

## 2011-12-20 MED ORDER — MORPHINE SULFATE (PF) 1 MG/ML IV SOLN
INTRAVENOUS | Status: DC
  Administered 2011-12-20: 36 mg via INTRAVENOUS
  Administered 2011-12-20: 1 mg via INTRAVENOUS
  Administered 2011-12-20: 6 mg via INTRAVENOUS
  Administered 2011-12-20 – 2011-12-21 (×3): via INTRAVENOUS
  Administered 2011-12-21: 1.5 mg via INTRAVENOUS
  Administered 2011-12-21: 12 mg via INTRAVENOUS
  Filled 2011-12-20 (×3): qty 25

## 2011-12-20 MED ORDER — PHENYLEPHRINE HCL 10 MG/ML IJ SOLN
INTRAMUSCULAR | Status: DC | PRN
  Administered 2011-12-20 (×2): 50 ug via INTRAVENOUS

## 2011-12-20 MED ORDER — LACTATED RINGERS IV SOLN
INTRAVENOUS | Status: DC | PRN
  Administered 2011-12-20 (×2): via INTRAVENOUS

## 2011-12-20 MED ORDER — ENOXAPARIN SODIUM 40 MG/0.4ML ~~LOC~~ SOLN
40.0000 mg | SUBCUTANEOUS | Status: DC
  Administered 2011-12-21 – 2011-12-24 (×4): 40 mg via SUBCUTANEOUS
  Filled 2011-12-20 (×5): qty 0.4

## 2011-12-20 MED ORDER — MORPHINE SULFATE 10 MG/ML IJ SOLN
INTRAMUSCULAR | Status: AC
  Filled 2011-12-20: qty 1

## 2011-12-20 MED ORDER — PROPOFOL 10 MG/ML IV BOLUS
INTRAVENOUS | Status: DC | PRN
  Administered 2011-12-20: 180 mg via INTRAVENOUS

## 2011-12-20 MED ORDER — FENTANYL CITRATE 0.05 MG/ML IJ SOLN
25.0000 ug | INTRAMUSCULAR | Status: AC | PRN
  Administered 2011-12-20 (×6): 25 ug via INTRAVENOUS

## 2011-12-20 MED ORDER — KCL IN DEXTROSE-NACL 30-5-0.45 MEQ/L-%-% IV SOLN
INTRAVENOUS | Status: DC
  Administered 2011-12-20 – 2011-12-21 (×4): via INTRAVENOUS
  Administered 2011-12-22: 100 mL via INTRAVENOUS
  Administered 2011-12-22: 50 mL via INTRAVENOUS
  Administered 2011-12-23: 100 mL via INTRAVENOUS
  Administered 2011-12-23 – 2011-12-25 (×4): via INTRAVENOUS
  Filled 2011-12-20 (×15): qty 1000

## 2011-12-20 MED ORDER — ALVIMOPAN 12 MG PO CAPS
12.0000 mg | ORAL_CAPSULE | Freq: Two times a day (BID) | ORAL | Status: DC
  Administered 2011-12-21 – 2011-12-22 (×3): 12 mg via ORAL
  Filled 2011-12-20 (×6): qty 1

## 2011-12-20 MED ORDER — FENTANYL CITRATE 0.05 MG/ML IJ SOLN: 50.0000 ug | INTRAMUSCULAR | Status: DC | PRN

## 2011-12-20 MED ORDER — FENTANYL CITRATE 0.05 MG/ML IJ SOLN
INTRAMUSCULAR | Status: DC | PRN
  Administered 2011-12-20 (×2): 50 ug via INTRAVENOUS
  Administered 2011-12-20 (×2): 100 ug via INTRAVENOUS
  Administered 2011-12-20 (×4): 50 ug via INTRAVENOUS

## 2011-12-20 MED ORDER — LIDOCAINE HCL (CARDIAC) 20 MG/ML IV SOLN
INTRAVENOUS | Status: DC | PRN
  Administered 2011-12-20: 50 mg via INTRAVENOUS

## 2011-12-20 MED ORDER — MORPHINE SULFATE (PF) 1 MG/ML IV SOLN
INTRAVENOUS | Status: AC
  Filled 2011-12-20: qty 25

## 2011-12-20 MED ORDER — SODIUM CHLORIDE 0.9 % IV SOLN
1.0000 g | INTRAVENOUS | Status: AC
  Administered 2011-12-20: 1 g via INTRAVENOUS

## 2011-12-20 MED ORDER — SODIUM CHLORIDE 0.9 % IV SOLN
INTRAVENOUS | Status: AC
  Filled 2011-12-20: qty 1

## 2011-12-20 MED ORDER — NALOXONE HCL 0.4 MG/ML IJ SOLN: 0.4000 mg | INTRAMUSCULAR | Status: DC | PRN

## 2011-12-20 MED ORDER — ACETAMINOPHEN 10 MG/ML IV SOLN
INTRAVENOUS | Status: AC
  Filled 2011-12-20: qty 100

## 2011-12-20 MED ORDER — ALVIMOPAN 12 MG PO CAPS
12.0000 mg | ORAL_CAPSULE | Freq: Once | ORAL | Status: AC
  Administered 2011-12-20: 12 mg via ORAL

## 2011-12-20 MED ORDER — ACETAMINOPHEN 10 MG/ML IV SOLN
INTRAVENOUS | Status: DC | PRN
  Administered 2011-12-20: 1000 mg via INTRAVENOUS

## 2011-12-20 SURGICAL SUPPLY — 52 items
APPLICATOR COTTON TIP 6IN STRL (MISCELLANEOUS) ×8 IMPLANT
BLADE EXTENDED COATED 6.5IN (ELECTRODE) IMPLANT
BLADE HEX COATED 2.75 (ELECTRODE) ×4 IMPLANT
BLADE SURG SZ10 CARB STEEL (BLADE) IMPLANT
CANISTER SUCTION 2500CC (MISCELLANEOUS) ×4 IMPLANT
CATH COUDE 5CC RIBBED (CATHETERS) ×3 IMPLANT
CATH RIBBED COUDE 5CC (CATHETERS) ×4
CHLORAPREP W/TINT 26ML (MISCELLANEOUS) ×4 IMPLANT
CLIP TI LARGE 6 (CLIP) IMPLANT
CLOTH BEACON ORANGE TIMEOUT ST (SAFETY) ×4 IMPLANT
COVER MAYO STAND STRL (DRAPES) ×4 IMPLANT
DRAPE LAPAROSCOPIC ABDOMINAL (DRAPES) ×4 IMPLANT
DRAPE LG THREE QUARTER DISP (DRAPES) IMPLANT
DRAPE WARM FLUID 44X44 (DRAPE) ×4 IMPLANT
DRSG PAD ABDOMINAL 8X10 ST (GAUZE/BANDAGES/DRESSINGS) ×4 IMPLANT
ELECT REM PT RETURN 9FT ADLT (ELECTROSURGICAL) ×4
ELECTRODE REM PT RTRN 9FT ADLT (ELECTROSURGICAL) ×3 IMPLANT
ENSEAL DEVICE STD TIP 35CM (ENDOMECHANICALS) ×4 IMPLANT
GLOVE BIOGEL PI IND STRL 7.0 (GLOVE) ×3 IMPLANT
GLOVE BIOGEL PI INDICATOR 7.0 (GLOVE) ×1
GLOVE SURG ORTHO 8.0 STRL STRW (GLOVE) ×8 IMPLANT
GOWN STRL NON-REIN LRG LVL3 (GOWN DISPOSABLE) ×4 IMPLANT
GOWN STRL REIN XL XLG (GOWN DISPOSABLE) ×8 IMPLANT
GUIDEWIRE STR DUAL SENSOR (WIRE) ×4 IMPLANT
HAND ACTIVATED (MISCELLANEOUS) IMPLANT
KIT BASIN OR (CUSTOM PROCEDURE TRAY) ×4 IMPLANT
LEGGING LITHOTOMY PAIR STRL (DRAPES) IMPLANT
LIGASURE IMPACT 36 18CM CVD LR (INSTRUMENTS) ×4 IMPLANT
NS IRRIG 1000ML POUR BTL (IV SOLUTION) ×8 IMPLANT
PACK GENERAL/GYN (CUSTOM PROCEDURE TRAY) ×4 IMPLANT
SEALER TISSUE X1 CVD JAW (INSTRUMENTS) ×4 IMPLANT
SPONGE GAUZE 4X4 12PLY (GAUZE/BANDAGES/DRESSINGS) ×4 IMPLANT
STAPLER VISISTAT 35W (STAPLE) ×4 IMPLANT
SUCTION POOLE TIP (SUCTIONS) ×4 IMPLANT
SUT NOV 1 T60/GS (SUTURE) IMPLANT
SUT NOVA 1 T20/GS 25DT (SUTURE) ×16 IMPLANT
SUT NOVA NAB DX-16 0-1 5-0 T12 (SUTURE) IMPLANT
SUT NOVA T20/GS 25 (SUTURE) IMPLANT
SUT PDS AB 1 TP1 96 (SUTURE) ×8 IMPLANT
SUT SILK 2 0 (SUTURE) ×4
SUT SILK 2 0 SH CR/8 (SUTURE) ×16 IMPLANT
SUT SILK 2 0SH CR/8 30 (SUTURE) ×4 IMPLANT
SUT SILK 2-0 18XBRD TIE 12 (SUTURE) ×3 IMPLANT
SUT SILK 2-0 30XBRD TIE 12 (SUTURE) IMPLANT
SUT SILK 3 0 (SUTURE) ×6
SUT SILK 3 0 SH CR/8 (SUTURE) ×4 IMPLANT
SUT SILK 3-0 18XBRD TIE 12 (SUTURE) ×6 IMPLANT
TAPE CLOTH SURG 4X10 WHT LF (GAUZE/BANDAGES/DRESSINGS) ×4 IMPLANT
TOWEL OR 17X26 10 PK STRL BLUE (TOWEL DISPOSABLE) ×8 IMPLANT
TRAY FOLEY CATH 14FRSI W/METER (CATHETERS) ×4 IMPLANT
WATER STERILE IRR 1500ML POUR (IV SOLUTION) IMPLANT
YANKAUER SUCT BULB TIP NO VENT (SUCTIONS) ×4 IMPLANT

## 2011-12-20 NOTE — Anesthesia Postprocedure Evaluation (Signed)
  Anesthesia Post-op Note  Patient: Harry Carroll  Procedure(s) Performed: Procedure(s) (LRB): PARTIAL COLECTOMY (N/A) CYSTOSCOPY WITH URETHRAL DILATATION ()  Patient Location: PACU  Anesthesia Type: General  Level of Consciousness: awake and alert   Airway and Oxygen Therapy: Patient Spontanous Breathing  Post-op Pain: mild  Post-op Assessment: Post-op Vital signs reviewed, Patient's Cardiovascular Status Stable, Respiratory Function Stable, Patent Airway and No signs of Nausea or vomiting  Post-op Vital Signs: stable  Complications: No apparent anesthesia complications

## 2011-12-20 NOTE — Plan of Care (Signed)
Problem: Diagnosis - Type of Surgery Goal: General Surgical Patient Education (See Patient Education module for education specifics) Outcome: Progressing progressing   Problem: Phase I Progression Outcomes Goal: OOB as tolerated unless otherwise ordered Outcome: Progressing progressing

## 2011-12-20 NOTE — Interval H&P Note (Signed)
History and Physical Interval Note:  12/20/2011 7:20 AM  Harry Carroll  has presented today for surgery, with the diagnosis of diverticular disease.  The various methods of treatment have been discussed with the patient and family. After consideration of risks, benefits and other options for treatment, the patient has consented to    Procedure(s) (LRB): PARTIAL COLECTOMY (N/A) as a surgical intervention .    The patient's history has been reviewed, patient examined, no change in status, stable for surgery.  I have reviewed the patients' chart and labs.  Questions were answered to the patient's satisfaction.    Velora Heckler, MD, [facility] Office: [phone]    Harry Carroll Judie Petit

## 2011-12-20 NOTE — Brief Op Note (Signed)
12/20/2011  10:20 AM  PATIENT:  Harry Carroll  51 y.o. male  PRE-OPERATIVE DIAGNOSIS:  Sigmoid diverticular disease  POST-OPERATIVE DIAGNOSIS:  Same  PROCEDURE:  Procedure(s) (LRB): PARTIAL COLECTOMY (N/A) CYSTOSCOPY WITH URETHRAL DILATATION ()  SURGEON:  Surgeon(s) and Role:    * Velora Heckler, MD - Primary    * Marcine Matar, MD  ANESTHESIA:   general  EBL:  Total I/O In: 1000 [I.V.:1000] Out: 100 [Urine:100]  BLOOD ADMINISTERED:none  DRAINS: Urinary Catheter (Foley)   LOCAL MEDICATIONS USED:  NONE  SPECIMEN:  Excision  DISPOSITION OF SPECIMEN:  PATHOLOGY  COUNTS:  YES  TOURNIQUET:  * No tourniquets in log *  DICTATION: .Other Dictation: Dictation Number [id number]  PLAN OF CARE: Admit to inpatient   PATIENT DISPOSITION:  PACU - hemodynamically stable.   Delay start of Pharmacological VTE agent (>24hrs) due to surgical blood loss or risk of bleeding: yes  Velora Heckler, MD, [facility] Office: [phone]

## 2011-12-20 NOTE — Consult Note (Signed)
Urology Consult  CC: intraoperative consult for difficult catheterization  HPI: 51 year old male undergoing colectomy by Dr. Darnell Level today. After induction of anesthesia, attempt was made at placement of Foley catheter. This was difficult. Urologic consultation is requested. The patient has no preoperative history of difficulty urinating.  PMH: Past Medical History  Diagnosis Date  . Colon polyps     adenomatous  . Diverticulitis   . Hyperlipemia   . Insomnia   . Arthritis   . Carpal tunnel syndrome   . Heart murmur     "EXTRA BEAT"  . Restless leg   . Sleep apnea     C-PAP NOT NEED FOR "MILD SLEEP APNEA"  . Depression     BIPOLAR    PSH: Past Surgical History  Procedure Date  . Maxillofacial surgery   . Right shoulder surgery     X2  . Right knee arthroscopy     2007  . Shoulder surgery     LEFT    Allergies: Allergies  Allergen Reactions  . Ciprofloxacin Shortness Of Breath, Nausea And Vomiting, Nausea Only, Rash and Other (See Comments)    Marked mucous production  . Dilaudid (Hydromorphone Hcl) Shortness Of Breath and Nausea Only    Patient developed rash, excessive mucous production and chest pain.  Marland Kitchen Hydrocodone Itching    Medications: Prescriptions prior to admission  Medication Sig Dispense Refill  . atorvastatin (LIPITOR) 40 MG tablet Take 0.5 tablets (20 mg total) by mouth at bedtime.  30 tablet  0  . divalproex (DEPAKOTE) 500 MG 24 hr tablet Take 1,000 mg by mouth every evening.       . lamoTRIgine (LAMICTAL) 100 MG tablet Take 100 mg by mouth every evening.       Marland Kitchen oxyCODONE-acetaminophen (PERCOCET) 5-325 MG per tablet Take 1 tablet by mouth every 4 (four) hours as needed. 1 - 2 TAB EVERY 4 HRS AS NEEDED FOR PAIN      . zolpidem (AMBIEN) 10 MG tablet Take 5 mg by mouth at bedtime as needed. For sleep      . DISCONTD: metroNIDAZOLE (FLAGYL) 500 MG tablet Take 500 mg by mouth 3 (three) times daily.      Marland Kitchen DISCONTD: amoxicillin-clavulanate  (AUGMENTIN) 875-125 MG per tablet Take 1 tablet by mouth 2 (two) times daily.         Social History: History   Social History  . Marital Status: Married    Spouse Name: N/A    Number of Children: N/A  . Years of Education: N/A   Occupational History  . Software    Social History Main Topics  . Smoking status: Never Smoker   . Smokeless tobacco: Current User    Types: Snuff  . Alcohol Use: Yes     OCCASIONAL  . Drug Use: No  . Sexually Active: No   Other Topics Concern  . Not on file   Social History Narrative  . No narrative on file    Family History: Family History  Problem Relation Age of Onset  . Prostate cancer Paternal Grandfather   . Cancer Paternal Grandfather     prostate  . Cancer Paternal Aunt     lymph cancer    Review of Systems: Positive: abdominal pain Negative: .  A further 10 point review of systems was negative except what is listed in the HPI.  Physical Exam: @VITALS2 @ General: The patient is intubated and anesthetized Abdomen: Soft.  Bladder is nonpalpable. Skin:  Normal  turgor.  No visible rash.   Penis:  circumcised.  No lesions. Urethra:   Orthotopic meatus. Scrotum: No lesions.  No ecchymosis.  No erythema. Testicles: Descended bilaterally.  No masses bilaterally. Epididymis: Palpable bilaterally.Non Tender to palpation.  Studies:  Recent Labs  Basename 12/20/11 1155   HGB 15.5   WBC 10.3   PLT 173    Recent Labs  Basename 12/20/11 1155   NA --   K --   CL --   CO2 --   BUN --   CREATININE 1.22   CALCIUM --   GFRNONAA 67*   GFRAA 77*     No results found for this basename: PT:2,INR:2,APTT:2 in the last 72 hours   No components found with this basename: ABG:2 Flexible cystoscopy was performed. The anterior urethra was normal. There was a blind-ending bulbous urethra. More than likely, this was secondary to catheter trauma. I was unable to navigate a center tip guidewire through the bulbous urethra. A very small  flap of tissue was located posteriorly, and I was able to navigate a guidewire blindly through this, approximately 10 cm further. I did not have fluoroscopy to verify position in the bladder. However, I passed a 10 [place] Heyman dilator proximal, and then removed the guidewire. Urine came through the channel, letting me know that I was properly in the bladder. I replaced the guidewire, and over top of the guidewire dilated the patient to 26 [place] sequentially. I then removed the last dilator and placed an 18 [place] council tip catheter. This drained urine appropriately.   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 Leave the catheter in overnight. I do not think he needs an extended period of catheter placement  2. I will see him postoperatively to verify adequate voiding. More than likely, he will need a followup in our office in 3-4 weeks to assess adequate bladder emptying    [phone]

## 2011-12-20 NOTE — H&P (View-Only) (Signed)
General Surgery - [facility]  Chief Complaint  Patient presents with  . Diverticulitis    Recurrent diverticulitis - referral from Dr. Robert Kaplan, [facility]   HISTORY: The patient is a 51-year-old white male referred by gastroenterology for persistent diverticulitis. Patient was initially diagnosed in 1997. He has had multiple episodes requiring outpatient management. Patient has recently required hospitalization for intravenous antibiotics. He developed pain and fever. CT scan documented sigmoid diverticulitis. The patient has undergone a previous colonoscopy approximately 4 years ago. He has not had any complications. He is currently taking oral antibiotics. Patient has had no prior abdominal surgery. Patient is referred to general surgery at this time for consideration for sigmoid colectomy for management of recurrent episodes of diverticulitis.  Past Medical History  Diagnosis Date  . Colon polyps     adenomatous  . Diverticulitis   . Hyperlipemia   . Depression   . Insomnia   . Heart murmur   . Arthritis   . Carpal tunnel syndrome      Current Outpatient Prescriptions  Medication Sig Dispense Refill  . amoxicillin-clavulanate (AUGMENTIN) 875-125 MG per tablet Take 1 tablet by mouth 2 (two) times daily.  28 tablet  1  . atorvastatin (LIPITOR) 40 MG tablet Take 0.5 tablets (20 mg total) by mouth at bedtime.  30 tablet  0  . divalproex (DEPAKOTE) 500 MG 24 hr tablet Take 1,000 mg by mouth every evening.       . lamoTRIgine (LAMICTAL) 100 MG tablet Take 100 mg by mouth every evening.       . metroNIDAZOLE (FLAGYL) 500 MG tablet Take 1 tablet (500 mg total) by mouth 3 (three) times daily.  30 tablet  1  . OVER THE COUNTER MEDICATION Take 1 tablet by mouth 2 (two) times daily. rasberry ketones      . oxyCODONE-acetaminophen (PERCOCET) 5-325 MG per tablet Take 1-2 tablets by mouth every 6 (six) hours as needed.  30 tablet  0  . zolpidem (AMBIEN) 10 MG tablet  Take 5 mg by mouth at bedtime as needed. For sleep         Allergies  Allergen Reactions  . Ciprofloxacin Shortness Of Breath, Nausea And Vomiting, Nausea Only, Rash and Other (See Comments)    Marked mucous production  . Dilaudid (Hydromorphone Hcl) Shortness Of Breath and Nausea Only    Patient developed rash, excessive mucous production and chest pain.  . Hydrocodone Itching     Family History  Problem Relation Age of Onset  . Prostate cancer Paternal Grandfather   . Cancer Paternal Grandfather     prostate  . Cancer Paternal Aunt     lymph cancer     History   Social History  . Marital Status: Married    Spouse Name: N/A    Number of Children: N/A  . Years of Education: N/A   Occupational History  . Software    Social History Main Topics  . Smoking status: Never Smoker   . Smokeless tobacco: Current User    Types: Snuff  . Alcohol Use: No  . Drug Use: No  . Sexually Active: No   Other Topics Concern  . None   Social History Narrative  . None     REVIEW OF SYSTEMS - PERTINENT POSITIVES ONLY: Denies any change in bowel habits. Denies bleeding per rectum. Patient does note approximately 15 pounds weight loss in recent weeks.  EXAM: Filed Vitals:   11/30/11 0909  BP: 126/74    Pulse: 70  Temp: 96.4 F (35.8 C)  Resp: 14    HEENT: normocephalic; pupils equal and reactive; sclerae clear; dentition good; mucous membranes moist NECK:  symmetric on extension; no palpable anterior or posterior cervical lymphadenopathy; no supraclavicular masses; no tenderness CHEST: clear to auscultation bilaterally without rales, rhonchi, or wheezes CARDIAC: regular rate and rhythm without significant murmur; peripheral pulses are full ABDOMEN: soft without distension; bowel sounds present; no mass; no hepatosplenomegaly; no hernia; Mild to moderate tenderness left lower quadrant with guarding; no palpable mass EXT:  non-tender without edema; no deformity NEURO: no gross  focal deficits; no sign of tremor   LABORATORY RESULTS: See [facility] (CHL-Epic) for most recent results   RADIOLOGY RESULTS: See [facility] (CHL-Epic) for most recent results   IMPRESSION: #1 sigmoid diverticulitis, recurrent #2 bipolar disorder #3 hyperlipidemia  PLAN: The patient I had a lengthy discussion regarding diverticular disease and its surgical management. We discussed risk and benefits of the procedure. We discussed the anticipated hospital stay and postoperative recovery. We discussed the possibility of colostomy placement. Patient understands and wishes to proceed. I provided him with written literature regarding the surgery. We will make arrangements for his procedure in the near future. I would like to treat him with oral antibiotics for approximately 3 weeks prior to surgery.  The risks and benefits of the procedure have been discussed at length with the patient.  The patient understands the proposed procedure, potential alternative treatments, and the course of recovery to be expected.  All of the patient's questions have been answered at this time.  The patient wishes to proceed with surgery.  Breionna Punt M. Amairany Schumpert, MD, FACS General & Endocrine Surgery [facility]   Visit Diagnoses: 1. Diverticulitis   2. DIVERTICULITIS OF COLON     Primary Care Physician: Jose Paz, MD   

## 2011-12-20 NOTE — Transfer of Care (Signed)
Immediate Anesthesia Transfer of Care Note  Patient: Harry Carroll  Procedure(s) Performed: Procedure(s) (LRB): PARTIAL COLECTOMY (N/A) CYSTOSCOPY WITH URETHRAL DILATATION ()  Patient Location: PACU  Anesthesia Type: General  Level of Consciousness: sedated, patient cooperative and responds to stimulaton  Airway & Oxygen Therapy: Patient Spontanous Breathing and Patient connected to face mask oxgen  Post-op Assessment: Report given to PACU RN and Post -op Vital signs reviewed and stable  Post vital signs: Reviewed and stable  Complications: No apparent anesthesia complications

## 2011-12-20 NOTE — Plan of Care (Signed)
Problem: Phase I Progression Outcomes Goal: Pain controlled with appropriate interventions Outcome: Progressing progressing  Goal: Tubes/drains patent Outcome: Progressing progressing

## 2011-12-21 ENCOUNTER — Encounter (HOSPITAL_COMMUNITY): Payer: Self-pay | Admitting: Surgery

## 2011-12-21 LAB — CBC
Hemoglobin: 15.2 g/dL (ref 13.0–17.0)
RBC: 4.96 MIL/uL (ref 4.22–5.81)
WBC: 7.7 10*3/uL (ref 4.0–10.5)

## 2011-12-21 LAB — BASIC METABOLIC PANEL
CO2: 27 mEq/L (ref 19–32)
Chloride: 98 mEq/L (ref 96–112)
Glucose, Bld: 113 mg/dL — ABNORMAL HIGH (ref 70–99)
Potassium: 4 mEq/L (ref 3.5–5.1)
Sodium: 133 mEq/L — ABNORMAL LOW (ref 135–145)

## 2011-12-21 MED ORDER — PROMETHAZINE HCL 25 MG/ML IJ SOLN
12.5000 mg | Freq: Four times a day (QID) | INTRAMUSCULAR | Status: DC | PRN
  Administered 2011-12-22: 25 mg via INTRAVENOUS
  Administered 2011-12-22 (×2): 12.5 mg via INTRAVENOUS
  Administered 2011-12-24: 25 mg via INTRAVENOUS
  Filled 2011-12-21 (×4): qty 1

## 2011-12-21 MED ORDER — ACETAMINOPHEN 10 MG/ML IV SOLN
1000.0000 mg | Freq: Four times a day (QID) | INTRAVENOUS | Status: AC
  Administered 2011-12-21 – 2011-12-22 (×4): 1000 mg via INTRAVENOUS
  Filled 2011-12-21 (×6): qty 100

## 2011-12-21 MED ORDER — FENTANYL CITRATE 0.05 MG/ML IJ SOLN: 25.0000 ug | INTRAMUSCULAR | Status: DC | PRN

## 2011-12-21 MED ORDER — DIVALPROEX SODIUM ER 500 MG PO TB24
1000.0000 mg | ORAL_TABLET | Freq: Every evening | ORAL | Status: DC
  Administered 2011-12-21 – 2011-12-24 (×4): 1000 mg via ORAL
  Filled 2011-12-21 (×5): qty 2

## 2011-12-21 MED ORDER — LAMOTRIGINE 100 MG PO TABS
100.0000 mg | ORAL_TABLET | Freq: Every evening | ORAL | Status: DC
  Administered 2011-12-21 – 2011-12-24 (×4): 100 mg via ORAL
  Filled 2011-12-21 (×5): qty 1

## 2011-12-21 MED ORDER — MAGIC MOUTHWASH
15.0000 mL | Freq: Four times a day (QID) | ORAL | Status: DC | PRN
  Filled 2011-12-21: qty 15

## 2011-12-21 MED ORDER — ALBUTEROL SULFATE HFA 108 (90 BASE) MCG/ACT IN AERS
2.0000 | INHALATION_SPRAY | Freq: Four times a day (QID) | RESPIRATORY_TRACT | Status: DC | PRN
  Administered 2011-12-21 (×3): 2 via RESPIRATORY_TRACT
  Filled 2011-12-21: qty 6.7

## 2011-12-21 MED ORDER — ZOLPIDEM TARTRATE 5 MG PO TABS: 5.0000 mg | ORAL_TABLET | Freq: Every evening | ORAL | Status: DC | PRN

## 2011-12-21 MED ORDER — ALUM & MAG HYDROXIDE-SIMETH 200-200-20 MG/5ML PO SUSP
30.0000 mL | Freq: Four times a day (QID) | ORAL | Status: DC | PRN
  Administered 2011-12-21: 30 mL via ORAL
  Filled 2011-12-21 (×2): qty 30

## 2011-12-21 MED ORDER — ACETAMINOPHEN 325 MG PO TABS: 325.0000 mg | ORAL_TABLET | Freq: Four times a day (QID) | ORAL | Status: DC | PRN

## 2011-12-21 MED ORDER — LIP MEDEX EX OINT
1.0000 "application " | TOPICAL_OINTMENT | Freq: Two times a day (BID) | CUTANEOUS | Status: DC
  Administered 2011-12-21 – 2011-12-24 (×6): 1 via TOPICAL
  Filled 2011-12-21 (×2): qty 7

## 2011-12-21 MED ORDER — NAPROXEN 500 MG PO TABS
500.0000 mg | ORAL_TABLET | Freq: Two times a day (BID) | ORAL | Status: DC
  Administered 2011-12-22 – 2011-12-25 (×6): 500 mg via ORAL
  Filled 2011-12-21 (×10): qty 1

## 2011-12-21 MED ORDER — MORPHINE SULFATE 2 MG/ML IJ SOLN
2.0000 mg | INTRAMUSCULAR | Status: DC | PRN
Start: 2011-12-21 — End: 2011-12-25
  Administered 2011-12-22 – 2011-12-25 (×5): 2 mg via INTRAVENOUS
  Filled 2011-12-21 (×5): qty 1

## 2011-12-21 NOTE — Progress Notes (Signed)
Patient ID: Harry Carroll, male   DOB: 01-26-1961, 51 y.o.   MRN: [id number]  General Surgery - [facility] - Progress Note  POD# 1  Subjective: Patient without significant pain.  No nausea or emesis.  Mild SOB relieved with HHN.  Objective: Vital signs in last 24 hours: Temp:  [97.7 F (36.5 C)-98.9 F (37.2 C)] 97.7 F (36.5 C) (07/10 0612) Pulse Rate:  [73-98] 89  (07/10 0612) Resp:  [12-20] 18  (07/10 0612) BP: (127-168)/(79-103) 131/81 mmHg (07/10 0612) SpO2:  [94 %-100 %] 96 % (07/10 0612) Weight:  [250 lb (113.399 kg)] 250 lb (113.399 kg) (07/09 1300) Last BM Date: 12/20/11  Intake/Output from previous day: 07/09 0701 - 07/10 0700 In: 5048.7 [P.O.:240; I.V.:4158.7] Out: 2375 [Urine:2375]  Exam: HEENT - clear, not icteric Neck - soft Chest - clear bilaterally Cor - RRR, no murmur Abd - soft, dressing dry and intact Ext - no significant edema Neuro - grossly intact, no focal deficits  Lab Results:   Basename 12/21/11 0332 12/20/11 1155  WBC 7.7 10.3  HGB 15.2 15.5  HCT 42.9 42.8  PLT 187 173     Basename 12/21/11 0332 12/20/11 1155  NA 133* --  K 4.0 --  CL 98 --  CO2 27 --  GLUCOSE 113* --  BUN 10 --  CREATININE 0.98 1.22  CALCIUM 8.8 --    Studies/Results: No results found.  Assessment / Plan: 1.  Status post sigmoid colectomy for diverticular disease  - sips clear liquids  - on Entereg  - OOB, ambulate, use IS  Velora Heckler, MD, [facility] Office: [phone]  12/21/2011

## 2011-12-21 NOTE — Progress Notes (Signed)
1 Day Post-Op Subjective: Patient reports to be feeling fairly well. Prior to surgery, he states that he has had years of difficulty with urination.  Objective: Vital signs in last 24 hours: Temp:  [97.7 F (36.5 C)-98.9 F (37.2 C)] 97.7 F (36.5 C) (07/10 0612) Pulse Rate:  [73-98] 89  (07/10 0612) Resp:  [12-20] 18  (07/10 0612) BP: (127-168)/(79-103) 131/81 mmHg (07/10 0612) SpO2:  [94 %-100 %] 96 % (07/10 0612) Weight:  [113.399 kg (250 lb)] 113.399 kg (250 lb) (07/09 1300)  Intake/Output from previous day: 07/09 0701 - 07/10 0700 In: 5048.7 [P.O.:240; I.V.:4158.7] Out: 2375 [Urine:2375] Intake/Output this shift: Total I/O In: 1996.7 [P.O.:240; I.V.:1106.7; Other:650] Out: 1150 [Urine:1150]  Physical Exam:  Constitutional: Vital signs reviewed. WD WN in NAD    Urine is clear  Lab Results:  Basename 12/21/11 0332 12/20/11 1155  HGB 15.2 15.5  HCT 42.9 42.8   BMET  Basename 12/21/11 0332 12/20/11 1155  NA 133* --  K 4.0 --  CL 98 --  CO2 27 --  GLUCOSE 113* --  BUN 10 --  CREATININE 0.98 1.22  CALCIUM 8.8 --   No results found for this basename: LABPT:3,INR:3 in the last 72 hours No results found for this basename: LABURIN:1 in the last 72 hours Results for orders placed during the hospital encounter of 12/13/11  SURGICAL PCR SCREEN     Status: Normal   Collection Time   12/13/11  2:39 PM      Component Value Range Status Comment   MRSA, PCR NEGATIVE  NEGATIVE Final    Staphylococcus aureus NEGATIVE  NEGATIVE Final     Studies/Results: No results found.  Assessment/Plan:   Urethral stricture with difficult catheter placement. Status post cystoscopy, urethral dilation and catheterization on 12/20/2011.    I think it is fine to remove the catheter for voiding trial today. I have given him my card, and lasting followup when he is recovered from his surgery.   LOS: 1 day   Chelsea Aus 12/21/2011, 6:53 AM

## 2011-12-21 NOTE — Op Note (Signed)
NAMEKNUTE, Harry Carroll NO.:  [id number]  MEDICAL RECORD NO.:  [id number]  LOCATION:  [facility]                         FACILITY:  [facility]  PHYSICIAN:  Velora Heckler, MD      DATE OF BIRTH:  1961/04/20  DATE OF PROCEDURE:  12/20/2011                               OPERATIVE REPORT   PREOPERATIVE DIAGNOSIS:  Sigmoid diverticular disease.  POSTOPERATIVE DIAGNOSIS:  Sigmoid diverticular disease.  PROCEDURE:  Sigmoid colectomy.  SURGEON:  Velora Heckler, MD, FACS  ANESTHESIA:  General per Dr. Ronelle Nigh.  ESTIMATED BLOOD LOSS:  Minimal.  PREPARATION:  ChloraPrep.  COMPLICATIONS:  None.  INDICATIONS:  The patient is a 51 year old white male referred by Dr. Melvia Heaps from [facility] with persistent refractory diverticulitis.  The patient had initially been diagnosed with diverticular disease in 1997.  He had had multiple episodes of diverticulitis, requiring outpatient management.  The patient was recently hospitalized for intravenous antibiotics.  CT scan showed sigmoid diverticulitis.  Colonoscopy had been performed approximately 4 years ago.  The patient now comes to Surgery for resection of the sigmoid colon for management of recurrent episodes of diverticulitis.  BODY OF REPORT:  Procedure was done in OR #11 at the [facility].  The patient was brought to the operating room, placed in supine position on the operating room table.  Following administration of general anesthesia, the patient was positioned and then prepped and draped in the usual strict aseptic fashion.  After ascertaining that an adequate level of anesthesia had been achieved, a midline abdominal incision was made with a #10 blade.  Dissection was carried through subcutaneous tissues.  Hemostasis was obtained with electrocautery.  Fascia was incised in the midline.  The peritoneal cavity was entered cautiously.  Abdomen was explored.  There was a focal area  of diverticular disease in the mid sigmoid colon.  There was a dense inflammatory process at this point.  Mesentery was somewhat foreshortened and fixed to the retroperitoneum.  The distal sigmoid and proximal rectum appeared normal.  The distal descending colon and proximal sigmoid colon appeared relatively normal.  There were peritoneal adhesions to the lateral abdominal wall and pelvis.  Incision was extended above the level of the umbilicus.  Balfour retractor was placed for exposure.  Adhesions were taken down with the electrocautery used for hemostasis.  The distal descending colon was mobilized along the lateral white line.  Sigmoid colon was mobilized from its adhesions.  The focal area of diverticular disease was mobilized from the retroperitoneum using electrocautery for hemostasis. A point at the distal descending colon was selected.  Spring clamp was placed on the descending colon.  The bowel was transected between bowel clamps.  The mesentery was divided with the Ethicon ENSEAL.  Dissection was carried distally on the mesentery taking care to avoid retroperitoneal structures.  The area of significant diverticular disease was mobilized from the retroperitoneum.  Larger vascular structures were divided between Kelly clamps and ligated with 2-0 silk ties.  ENSEAL was used to divide the remaining mesentery to the level of the proximal rectum at the peritoneal reflection.  Bowel was dissected out circumferentially.  Stay sutures  were placed on the proximal rectum. Distal sigmoid was clamped with a Kocher clamp and divided sharply. Specimen was marked with a suture at the distally end of the sigmoid colon.  The sigmoid colon was then submitted to Pathology for review.  Next, an end-to-end anastomosis was created between the distal descending colon and the proximal rectum.  This was performed in a single layer with interrupted 2-0 silk sutures.  Good approximation was achieved  without tension.  Tissue appears well vascularized.  The abdomen was irrigated with warm saline, which was evacuated.  Good hemostasis was noted.  All sponges were removed.  Retractor was removed. Midline abdominal incision was then closed with interrupted #1 Novafil simple sutures.  Subcutaneous tissues were irrigated.  Skin was closed with stainless steel staples.  Sterile dressings were applied.  The patient was awakened from anesthesia and brought to the recovery room. The patient tolerated the procedure well.  Velora Heckler, MD, [facility] Office: [phone]   TMG/MEDQ  D:  12/20/2011  T:  12/20/2011  Job:  [id number]  cc:   Barbette Hair. Arlyce Dice, MD,FACG [address] [place] [address]  Willow Ora, MD [id number] [place], [place] [address]  Bertram Millard. Dahlstedt, M.D. Fax: [phone]

## 2011-12-22 MED ORDER — PANTOPRAZOLE SODIUM 40 MG IV SOLR
40.0000 mg | INTRAVENOUS | Status: DC
  Administered 2011-12-22 – 2011-12-24 (×3): 40 mg via INTRAVENOUS
  Filled 2011-12-22 (×4): qty 40

## 2011-12-22 NOTE — Progress Notes (Signed)
Spoke to Dr. Gerrit Friends on floor pertaining to patient had emesis episode during night bile and received phenergan, on call md made aware and made npo. Just had episode with approx 75ml bile and received zofran, family concerned about lethargy and some confusion, at this time patient awake but forgetful at times. MD states he is going to see patient.

## 2011-12-22 NOTE — Progress Notes (Signed)
Patient ID: Harry Carroll, male   DOB: May 15, 1961, 51 y.o.   MRN: [id number]  General Surgery - [facility] - Progress Note  POD# 2  Subjective: Patient with nausea, 3 episodes of emesis overnight.  Sleepy after phenergan and zofran.  Now NPO per on call MD.  Objective: Vital signs in last 24 hours: Temp:  [97.9 F (36.6 C)-98.7 F (37.1 C)] 98.1 F (36.7 C) (07/11 0500) Pulse Rate:  [78-96] 78  (07/11 0500) Resp:  [18-20] 18  (07/11 0500) BP: (132-170)/(68-96) 159/87 mmHg (07/11 0500) SpO2:  [92 %-96 %] 96 % (07/11 0500) Last BM Date: 12/20/11  Intake/Output from previous day: 07/10 0701 - 07/11 0700 In: 735 [P.O.:610] Out: 3150 [Urine:2800; Emesis/NG output:350]  Exam: HEENT - clear, not icteric Neck - soft Chest - clear bilaterally Cor - RRR, no murmur Abd - mild distension; quiet; dressing dry and intact Ext - no significant edema Neuro - grossly intact, no focal deficits  Lab Results:   Basename 12/21/11 0332 12/20/11 1155  WBC 7.7 10.3  HGB 15.2 15.5  HCT 42.9 42.8  PLT 187 173     Basename 12/21/11 0332 12/20/11 1155  NA 133* --  K 4.0 --  CL 98 --  CO2 27 --  GLUCOSE 113* --  BUN 10 --  CREATININE 0.98 1.22  CALCIUM 8.8 --    Studies/Results: No results found.  Assessment / Plan: 1.  Status post sigmoid colectomy for diverticular disease  - sips, IVF  - OOB, ambulate in halls  - will try to avoid NG tube if possible  - discussed with wife at bedside  Velora Heckler, MD, [facility] Office: [phone]  12/22/2011

## 2011-12-22 NOTE — Progress Notes (Signed)
Spoke to Dr. Gerrit Friends and informed him patient c/o indigestion this am, protonix iv ordered

## 2011-12-22 NOTE — Progress Notes (Signed)
Patient vomited 350 cc dark green liquid.DR Dwain Sarna notified and order received to change diet to NPO.

## 2011-12-23 MED ORDER — HYDROCODONE-ACETAMINOPHEN 5-325 MG PO TABS: 1.0000 | ORAL_TABLET | ORAL | Status: DC | PRN

## 2011-12-23 NOTE — Progress Notes (Signed)
Patient ID: Harry Carroll, male   DOB: Jul 09, 1960, 51 y.o.   MRN: [id number]  General Surgery - [facility] - Progress Note  POD# 3  Subjective: Patient with mild nausea, improving.  Small liquid BM this AM.  Mild pain.  Less somnolent.  Wife at bedside.  Objective: Vital signs in last 24 hours: Temp:  [97.4 F (36.3 C)-98.9 F (37.2 C)] 97.4 F (36.3 C) (07/12 0607) Pulse Rate:  [85-94] 94  (07/12 0607) Resp:  [18-20] 20  (07/12 0607) BP: (157-175)/(91-109) 175/109 mmHg (07/12 0607) SpO2:  [93 %-95 %] 94 % (07/12 0607) Last BM Date: 12/20/11  Intake/Output from previous day: 07/11 0701 - 07/12 0700 In: 3862.5 [I.V.:3862.5] Out: 702 [Urine:626; Emesis/NG output:75; Stool:1]  Exam: HEENT - clear, not icteric Neck - soft Chest - clear bilaterally Cor - RRR, no murmur Abd - soft, mild distension; BS present; wound clear and dry Ext - no significant edema Neuro - grossly intact, no focal deficits  Lab Results:   Basename 12/21/11 0332 12/20/11 1155  WBC 7.7 10.3  HGB 15.2 15.5  HCT 42.9 42.8  PLT 187 173     Basename 12/21/11 0332 12/20/11 1155  NA 133* --  K 4.0 --  CL 98 --  CO2 27 --  GLUCOSE 113* --  BUN 10 --  CREATININE 0.98 1.22  CALCIUM 8.8 --    Studies/Results: No results found.  Assessment / Plan: 1.  Status post sigmoid colectomy for diverticulitis  - advance to regular diet as tolerated  - OOB, ambulate  Velora Heckler, MD, [facility] Office: [phone]  12/23/2011

## 2011-12-24 ENCOUNTER — Inpatient Hospital Stay (HOSPITAL_COMMUNITY): Payer: 59

## 2011-12-24 NOTE — Progress Notes (Signed)
Patient ID: Harry Carroll, male   DOB: Sep 18, 1960, 51 y.o.   MRN: [id number] 4 Days Post-Op  Subjective: Having nausea and vomiting with any attempt oral intake. Had bilious vomiting last night but none since since limiting by mouth intake. Had minimal amount of flatus. No significant pain.  Objective: Vital signs in last 24 hours: Temp:  [98 F (36.7 C)-99.5 F (37.5 C)] 98 F (36.7 C) (07/13 0605) Pulse Rate:  [65-94] 77  (07/13 0605) Resp:  [18] 18  (07/13 0605) BP: (145-155)/(87-98) 145/87 mmHg (07/13 0605) SpO2:  [92 %-95 %] 95 % (07/13 0605) Last BM Date: 12/23/11  Intake/Output from previous day: 07/12 0701 - 07/13 0700 In: 2980 [P.O.:480; I.V.:2500] Out: 1400 [Urine:1050; Emesis/NG output:350] Intake/Output this shift:    General appearance: alert and no distress GI: abdomen is distended and tympanitic but nontender. Incision/Wound: Clean and dry.  Lab Results:  No results found for this basename: WBC:2,HGB:2,HCT:2,PLT:2 in the last 72 hours BMET No results found for this basename: NA:2,K:2,CL:2,CO2:2,GLUCOSE:2,BUN:2,CREATININE:2,CALCIUM:2 in the last 72 hours   Studies/Results: No results found.  Anti-infectives: Anti-infectives     Start     Dose/Rate Route Frequency Ordered Stop   12/20/11 0532   ertapenem (INVANZ) 1 g in sodium chloride 0.9 % 50 mL IVPB        1 g 100 mL/hr over 30 Minutes Intravenous 60 min pre-op 12/20/11 0532 12/20/11 0745          Assessment/Plan: s/p Procedure(s): PARTIAL COLECTOMY CYSTOSCOPY WITH URETHRAL DILATATION CYSTOSCOPY WITH URETHRAL DILATATION Apparent postoperative ileus. We'll check KUB. Limited by mouth intake. Continue to ambulate. Labs in a.m.   LOS: 4 days    Alexei Ey T 12/24/2011

## 2011-12-25 LAB — BASIC METABOLIC PANEL
BUN: 14 mg/dL (ref 6–23)
Chloride: 100 mEq/L (ref 96–112)
GFR calc Af Amer: >90 mL/min (ref >90)
GFR calc non Af Amer: >90 mL/min (ref >90)
Potassium: 3.9 mEq/L (ref 3.5–5.1)
Sodium: 133 mEq/L — ABNORMAL LOW (ref 135–145)

## 2011-12-25 LAB — CBC
HCT: 40.4 % (ref 39.0–52.0)
RDW: 12.6 % (ref 11.5–15.5)
WBC: 8 10*3/uL (ref 4.0–10.5)

## 2011-12-25 MED ORDER — HYDROCODONE-ACETAMINOPHEN 5-325 MG PO TABS: 1.0000 | ORAL_TABLET | ORAL | Status: AC | PRN

## 2011-12-25 MED ORDER — PANTOPRAZOLE SODIUM 40 MG PO TBEC: 40.0000 mg | DELAYED_RELEASE_TABLET | Freq: Every day | ORAL | Status: DC

## 2011-12-25 NOTE — Progress Notes (Signed)
Spoke to Dr. Johna Sheriff through OR RN informed him unable to print AVS at this time and he states he will fix after surgery. Also that patient has hydrocodone listed as an allergy but states only makes itch, no rash has been taking at home, no further orders given

## 2011-12-25 NOTE — Progress Notes (Signed)
Dr. Johna Sheriff called by Alda Berthold, RN and states will fix AVS for patient to be discharged.

## 2011-12-25 NOTE — Progress Notes (Signed)
Patient discharged via wheelchair. Incision CD&I. States understanding of discharge instructions. IV removed by Thomasena Edis, NT +3. Prescription for hydrocodone given.

## 2011-12-25 NOTE — Progress Notes (Signed)
Patient ID: Harry Carroll, male   DOB: 06-25-1960, 51 y.o.   MRN: [id number] 5 Days Post-Op  Subjective: Feels much better today. Has had flatus and bowel movement. No further nausea or vomiting. Tolerating liquids well and starting some solid food.  Objective: Vital signs in last 24 hours: Temp:  [98.2 F (36.8 C)-99 F (37.2 C)] 98.5 F (36.9 C) (07/14 0522) Pulse Rate:  [72-75] 72  (07/14 0522) Resp:  [16-18] 16  (07/14 0522) BP: (131-156)/(77-93) 131/77 mmHg (07/14 0522) SpO2:  [97 %-99 %] 97 % (07/14 0522) Last BM Date: 31-Dec-2011  Intake/Output from previous day: Dec 31, 2022 0701 - 07/14 0700 In: 2860 [P.O.:480; I.V.:2380] Out: 730 [Urine:727; Stool:3] Intake/Output this shift: Total I/O In: -  Out: 500 [Urine:500]  General appearance: alert and no distress GI: normal findings: soft, non-tender and distention resolved Incision/Wound: clean and dry without evidence of infection  Lab Results:   Basename 12/25/11 0500  WBC 8.0  HGB 14.0  HCT 40.4  PLT 206   BMET  Basename 12/25/11 0500  NA 133*  K 3.9  CL 100  CO2 23  GLUCOSE 105*  BUN 14  CREATININE 0.84  CALCIUM 8.7     Studies/Results: Dg Abd 2 Views  2011-12-31  *RADIOLOGY REPORT*  Clinical Data: Postop vomiting.  ABDOMEN - 2 VIEW  Comparison: 11/23/2011  Findings: Upright and supine views of the abdomen were obtained. There are dilated gas-filled loops of small and large bowel throughout the abdomen.  Surgical staples in the lower abdomen and pelvis.  There is a small amount of gas underneath the right hemidiaphragm consistent with free air.  IMPRESSION: Dilated gas-filled loops of small and large bowel throughout the abdomen and pelvis.  Findings most likely represent a postoperative ileus pattern.  Small amount of free air consistent with recent abdominal surgery.  Original Report Authenticated By: Richarda Overlie, M.D.    Anti-infectives: Anti-infectives     Start     Dose/Rate Route Frequency Ordered Stop   12/20/11 0532   ertapenem (INVANZ) 1 g in sodium chloride 0.9 % 50 mL IVPB        1 g 100 mL/hr over 30 Minutes Intravenous 60 min pre-op 12/20/11 0532 12/20/11 0745          Assessment/Plan: s/p Procedure(s): PARTIAL COLECTOMY CYSTOSCOPY WITH URETHRAL DILATATION Doing well with ileus apparently resolved. He will try solid food this morning and possible discharge later today.    LOS: 5 days    Areli Frary T 12/25/2011

## 2011-12-26 NOTE — Care Management Note (Signed)
    Page 1 of 1   12/26/2011     10:10:20 AM   CARE MANAGEMENT NOTE 12/26/2011  Patient:  Harry Carroll, Harry Carroll   Account Number:  [id number]  Date Initiated:  12/21/2011  Documentation initiated by:  Lorenda Ishihara  Subjective/Objective Assessment:   51 yo male admitted s/p colectomy for diverticulitis. PTA lived at home with spouse.     Action/Plan:   Anticipated DC Date:  12/26/2011   Anticipated DC Plan:  HOME/SELF CARE      DC Planning Services  CM consult      Choice offered to / List presented to:             Status of service:  Completed, signed off Medicare Important Message given?   (If response is "NO", the following Medicare IM given date fields will be blank) Date Medicare IM given:   Date Additional Medicare IM given:    Discharge Disposition:  HOME/SELF CARE  Per UR Regulation:  Reviewed for med. necessity/level of care/duration of stay  If discussed at Long Length of Stay Meetings, dates discussed:    Comments:

## 2011-12-26 NOTE — Discharge Summary (Signed)
  Physician Discharge Summary [facility]  Patient ID: XZAIVER VAYDA MRN: [id number] DOB/AGE: 1960/09/07 51 y.o.  Admit date: 12/20/2011 Discharge date: 12/26/2011  Admission Diagnoses:  Diverticular disease of sigmoid colon  Discharge Diagnoses:  Principal Problem:  *DIVERTICULITIS OF COLON   Discharged Condition: good  Hospital Course: patient admitted after sigmoid colectomy for chronic diverticular disease.  Pathology showed acute diverticulitis and diverticulosis.  Post op course complicated only by slightly prolonged ileus.  Patient did take Entereg.  Ileus resolved and diet advanced.  Pain controlled initially with PCA and then converted to oral narcotics.  Prepared for discharge home on POD#5.  Consults: None  Significant Diagnostic Studies: none  Treatments: surgery: open sigmoid colectomy  Discharge Exam: Blood pressure 131/77, pulse 72, temperature 98.5 F (36.9 C), temperature source Oral, resp. rate 16, height 5\' 9"  (1.753 m), weight 250 lb (113.399 kg), SpO2 97.00%. HEENT - clear Neck - soft Chest - clear bilat Cor - RRR Abd - soft, BS present; wound intact without cellulitis  Disposition: Home with family  Discharge Orders    Future Appointments: Provider: Department: Dept Phone: Center:   03/02/2012 8:30 AM Wanda Plump, MD [facility] [phone] [facility]     Future Orders Please Complete By Expires   Discharge patient      Comments:   If tolerates breakfast without difficulty today     Medication List  As of 12/26/2011  4:20 PM   STOP taking these medications         amoxicillin-clavulanate 875-125 MG per tablet      metroNIDAZOLE 500 MG tablet         TAKE these medications         AMBIEN 10 MG tablet   Generic drug: zolpidem   Take 5 mg by mouth at bedtime as needed. For sleep      atorvastatin 40 MG tablet   Commonly known as: LIPITOR   Take 0.5 tablets (20 mg total) by mouth at bedtime.      divalproex 500 MG 24  hr tablet   Commonly known as: DEPAKOTE ER   Take 1,000 mg by mouth every evening.      HYDROcodone-acetaminophen 5-325 MG per tablet   Commonly known as: NORCO   Take 1-2 tablets by mouth every 4 (four) hours as needed.      lamoTRIgine 100 MG tablet   Commonly known as: LAMICTAL   Take 100 mg by mouth every evening.      oxyCODONE-acetaminophen 5-325 MG per tablet   Commonly known as: PERCOCET   Take 1 tablet by mouth every 4 (four) hours as needed. 1 - 2 TAB EVERY 4 HRS AS NEEDED FOR PAIN           Follow-up Information    Follow up with Velora Heckler, MD. Schedule an appointment as soon as possible for a visit in 1 week.   Contact information:   [employer] [facility] [address], [address] [place] [address] [phone]         Velora Heckler, MD, [facility] Office: [phone]     Signed: Velora Heckler 12/26/2011, 4:20 PM

## 2011-12-29 ENCOUNTER — Emergency Department (HOSPITAL_COMMUNITY): Payer: 59

## 2011-12-29 ENCOUNTER — Ambulatory Visit (INDEPENDENT_AMBULATORY_CARE_PROVIDER_SITE_OTHER): Payer: 59 | Admitting: General Surgery

## 2011-12-29 ENCOUNTER — Telehealth (INDEPENDENT_AMBULATORY_CARE_PROVIDER_SITE_OTHER): Payer: Self-pay | Admitting: General Surgery

## 2011-12-29 ENCOUNTER — Inpatient Hospital Stay (HOSPITAL_COMMUNITY)
Admission: EM | Admit: 2011-12-29 | Discharge: 2012-01-02 | DRG: 394 | Disposition: A | Discharge status: Home / Self Care | Payer: 59 | Attending: Surgery | Admitting: Surgery

## 2011-12-29 ENCOUNTER — Encounter (HOSPITAL_COMMUNITY): Payer: Self-pay | Admitting: *Deleted

## 2011-12-29 ENCOUNTER — Ambulatory Visit
Admission: RE | Admit: 2011-12-29 | Discharge: 2011-12-29 | Disposition: A | Discharge status: Home / Self Care | Payer: 59 | Source: Ambulatory Visit | Attending: General Surgery | Admitting: General Surgery

## 2011-12-29 DIAGNOSIS — Z8719 Personal history of other diseases of the digestive system: Secondary | ICD-10-CM

## 2011-12-29 DIAGNOSIS — E781 Pure hyperglyceridemia: Secondary | ICD-10-CM | POA: Diagnosis present

## 2011-12-29 DIAGNOSIS — K567 Ileus, unspecified: Secondary | ICD-10-CM | POA: Diagnosis present

## 2011-12-29 DIAGNOSIS — Z860100 Personal history of colon polyps, unspecified: Secondary | ICD-10-CM

## 2011-12-29 DIAGNOSIS — R109 Unspecified abdominal pain: Secondary | ICD-10-CM

## 2011-12-29 DIAGNOSIS — F319 Bipolar disorder, unspecified: Secondary | ICD-10-CM | POA: Diagnosis present

## 2011-12-29 DIAGNOSIS — Z09 Encounter for follow-up examination after completed treatment for conditions other than malignant neoplasm: Secondary | ICD-10-CM

## 2011-12-29 DIAGNOSIS — K560 Paralytic ileus: Secondary | ICD-10-CM | POA: Diagnosis present

## 2011-12-29 DIAGNOSIS — K5792 Diverticulitis of intestine, part unspecified, without perforation or abscess without bleeding: Secondary | ICD-10-CM | POA: Diagnosis present

## 2011-12-29 DIAGNOSIS — K668 Other specified disorders of peritoneum: Principal | ICD-10-CM | POA: Diagnosis present

## 2011-12-29 DIAGNOSIS — G4700 Insomnia, unspecified: Secondary | ICD-10-CM | POA: Diagnosis present

## 2011-12-29 DIAGNOSIS — F329 Major depressive disorder, single episode, unspecified: Secondary | ICD-10-CM | POA: Diagnosis present

## 2011-12-29 DIAGNOSIS — G4733 Obstructive sleep apnea (adult) (pediatric): Secondary | ICD-10-CM | POA: Diagnosis present

## 2011-12-29 DIAGNOSIS — K9189 Other postprocedural complications and disorders of digestive system: Secondary | ICD-10-CM | POA: Diagnosis present

## 2011-12-29 DIAGNOSIS — F3289 Other specified depressive episodes: Secondary | ICD-10-CM | POA: Diagnosis present

## 2011-12-29 DIAGNOSIS — G2581 Restless legs syndrome: Secondary | ICD-10-CM | POA: Diagnosis present

## 2011-12-29 DIAGNOSIS — Z8639 Personal history of other endocrine, nutritional and metabolic disease: Secondary | ICD-10-CM | POA: Diagnosis present

## 2011-12-29 DIAGNOSIS — Z9049 Acquired absence of other specified parts of digestive tract: Secondary | ICD-10-CM

## 2011-12-29 LAB — CBC WITH DIFFERENTIAL/PLATELET
Basophils Relative: 0 % (ref 0–1)
Eosinophils Absolute: 0.2 10*3/uL (ref 0.0–0.7)
Eosinophils Relative: 2 % (ref 0–5)
Hemoglobin: 14.5 g/dL (ref 13.0–17.0)
Lymphs Abs: 1 10*3/uL (ref 0.7–4.0)
MCH: 30.3 pg (ref 26.0–34.0)
MCHC: 34.6 g/dL (ref 30.0–36.0)
MCV: 87.7 fL (ref 78.0–100.0)
Monocytes Absolute: 0.8 10*3/uL (ref 0.1–1.0)
Monocytes Relative: 10 % (ref 3–12)
RBC: 4.78 MIL/uL (ref 4.22–5.81)

## 2011-12-29 LAB — COMPREHENSIVE METABOLIC PANEL
ALT: 30 U/L (ref 0–53)
AST: 14 U/L (ref 0–37)
Albumin: 3.3 g/dL — ABNORMAL LOW (ref 3.5–5.2)
CO2: 24 mEq/L (ref 19–32)
Calcium: 9.4 mg/dL (ref 8.4–10.5)
Chloride: 93 mEq/L — ABNORMAL LOW (ref 96–112)
Creatinine, Ser: 1.03 mg/dL (ref 0.50–1.35)
Sodium: 131 mEq/L — ABNORMAL LOW (ref 135–145)

## 2011-12-29 MED ORDER — MORPHINE SULFATE 2 MG/ML IJ SOLN
1.0000 mg | INTRAMUSCULAR | Status: DC | PRN
  Administered 2011-12-29 – 2011-12-31 (×3): 1 mg via INTRAVENOUS
  Filled 2011-12-29 (×3): qty 1

## 2011-12-29 MED ORDER — SODIUM CHLORIDE 0.9 % IV BOLUS (SEPSIS)
1000.0000 mL | Freq: Once | INTRAVENOUS | Status: AC
  Administered 2011-12-29: 1000 mL via INTRAVENOUS

## 2011-12-29 MED ORDER — IOHEXOL 300 MG/ML  SOLN
100.0000 mL | Freq: Once | INTRAMUSCULAR | Status: AC | PRN
  Administered 2011-12-29: 100 mL via INTRAVENOUS

## 2011-12-29 MED ORDER — DIPHENHYDRAMINE HCL 50 MG/ML IJ SOLN: 12.5000 mg | Freq: Four times a day (QID) | INTRAMUSCULAR | Status: DC | PRN

## 2011-12-29 MED ORDER — HEPARIN SODIUM (PORCINE) 5000 UNIT/ML IJ SOLN
5000.0000 [IU] | Freq: Three times a day (TID) | INTRAMUSCULAR | Status: DC
  Administered 2011-12-29 – 2012-01-02 (×10): 5000 [IU] via SUBCUTANEOUS
  Filled 2011-12-29 (×14): qty 1

## 2011-12-29 MED ORDER — ACETAMINOPHEN 325 MG PO TABS: 650.0000 mg | ORAL_TABLET | Freq: Four times a day (QID) | ORAL | Status: DC | PRN

## 2011-12-29 MED ORDER — ACETAMINOPHEN 650 MG RE SUPP: 650.0000 mg | Freq: Four times a day (QID) | RECTAL | Status: DC | PRN

## 2011-12-29 MED ORDER — KCL IN DEXTROSE-NACL 20-5-0.45 MEQ/L-%-% IV SOLN
INTRAVENOUS | Status: DC
  Administered 2011-12-29 – 2011-12-31 (×6): via INTRAVENOUS
  Administered 2011-12-31: 75 mL/h via INTRAVENOUS
  Administered 2012-01-01: 04:00:00 via INTRAVENOUS
  Filled 2011-12-29 (×9): qty 1000

## 2011-12-29 MED ORDER — DIPHENHYDRAMINE HCL 12.5 MG/5ML PO ELIX: 12.5000 mg | ORAL_SOLUTION | Freq: Four times a day (QID) | ORAL | Status: DC | PRN

## 2011-12-29 MED ORDER — ONDANSETRON HCL 4 MG/2ML IJ SOLN: 4.0000 mg | Freq: Four times a day (QID) | INTRAMUSCULAR | Status: DC | PRN

## 2011-12-29 MED ORDER — PANTOPRAZOLE SODIUM 40 MG IV SOLR
40.0000 mg | Freq: Every day | INTRAVENOUS | Status: DC
  Administered 2011-12-29 – 2011-12-30 (×2): 40 mg via INTRAVENOUS
  Filled 2011-12-29 (×3): qty 40

## 2011-12-29 MED ORDER — PIPERACILLIN-TAZOBACTAM 3.375 G IVPB
3.3750 g | Freq: Three times a day (TID) | INTRAVENOUS | Status: DC
  Administered 2011-12-29 – 2012-01-02 (×10): 3.375 g via INTRAVENOUS
  Filled 2011-12-29 (×11): qty 50

## 2011-12-29 NOTE — Addendum Note (Signed)
Addended by: Joanette Gula on: 12/29/2011 09:58 AM   Modules accepted: Orders

## 2011-12-29 NOTE — ED Notes (Signed)
Attempted to call report to 5W. RN unable to take report at this time. Will call back.  

## 2011-12-29 NOTE — ED Notes (Signed)
Patient transported to CT 

## 2011-12-29 NOTE — ED Notes (Signed)
Dr.Lockwood at bedside  

## 2011-12-29 NOTE — Telephone Encounter (Signed)
He called while I was in surgery to say that he had increased pain and some "shifting of his intestines" when he moves.  He said that he had some intense pain after eating a bowl of cereal and some bloating in his abdomen.  No fevers.  He was able to sleep and feels better this morning.  I recommended that he come in to the hospital for evaluation but he said that he had an appointment with his surgeon today and would wait until then.  I called him back this am and he says that he is feeling better now.

## 2011-12-29 NOTE — ED Provider Notes (Signed)
History     CSN: [id number]  Arrival date & time 12/29/11  1154   First MD Initiated Contact with Patient 12/29/11 1201      Chief Complaint  Patient presents with  . Abdominal Pain    (Consider location/radiation/quality/duration/timing/severity/associated sxs/prior treatment) HPI The patient presents one week after surgical repair of diverticulitis now with increasing abdominal pain, by mouth intolerance, nausea. The patient notes that immediately after the procedure she was initially well, tolerating small amounts of by mouth.  Over the past days his pain has been increasing, is diffuse, sharp, worse with attempts to eat or drink.  He notes concurrent nausea.  The pain is minimally improved with hydrocodone.  He denies any fevers, chills, confusion, disorientation, dyspnea, chest pain. Patient saw his surgeon this morning, having initial plain films done.  These demonstrated distended bowel, and the patient was here for evaluation. Past Medical History  Diagnosis Date  . Colon polyps     adenomatous  . Diverticulitis   . Hyperlipemia   . Insomnia   . Arthritis   . Carpal tunnel syndrome   . Heart murmur     "EXTRA BEAT"  . Restless leg   . Sleep apnea     C-PAP NOT NEED FOR "MILD SLEEP APNEA"  . Depression     BIPOLAR    Past Surgical History  Procedure Date  . Maxillofacial surgery   . Right shoulder surgery     X2  . Right knee arthroscopy     2007  . Shoulder surgery     LEFT  . Partial colectomy 12/20/2011    Procedure: PARTIAL COLECTOMY;  Surgeon: Velora Heckler, MD;  Location: [facility];  Service: General;  Laterality: N/A;  Sigmoid colectomy  . Cystoscopy with urethral dilatation 12/20/2011    Procedure: CYSTOSCOPY WITH URETHRAL DILATATION;  Surgeon: Velora Heckler, MD;  Location: [facility];  Service: General;;  with foley insertion  . Cystoscopy with urethral dilatation 12/20/2011    Procedure: CYSTOSCOPY WITH URETHRAL DILATATION;  Surgeon: Marcine Matar, MD;   Location: [facility];  Service: Urology;;    Family History  Problem Relation Age of Onset  . Prostate cancer Paternal Grandfather   . Cancer Paternal Grandfather     prostate  . Cancer Paternal Aunt     lymph cancer    History  Substance Use Topics  . Smoking status: Never Smoker   . Smokeless tobacco: Current User    Types: Snuff  . Alcohol Use: Yes     OCCASIONAL      Review of Systems  Constitutional:       Per HPI, otherwise negative  HENT:       Per HPI, otherwise negative  Eyes: Negative.   Respiratory:       Per HPI, otherwise negative  Cardiovascular:       Per HPI, otherwise negative  Gastrointestinal: Negative for vomiting.  Genitourinary: Negative.   Musculoskeletal:       Per HPI, otherwise negative  Skin: Negative.   Neurological: Negative for syncope.    Allergies  Ciprofloxacin; Dilaudid; and Hydrocodone  Home Medications   Current Outpatient Rx  Name Route Sig Dispense Refill  . ATORVASTATIN CALCIUM 40 MG PO TABS Oral Take 0.5 tablets (20 mg total) by mouth at bedtime. 30 tablet 0  . DIVALPROEX SODIUM ER 500 MG PO TB24 Oral Take 1,000 mg by mouth every evening.     Marland Kitchen HYDROCODONE-ACETAMINOPHEN 5-325 MG PO TABS Oral Take 1-2  tablets by mouth every 4 (four) hours as needed. 30 tablet 1  . LAMOTRIGINE 100 MG PO TABS Oral Take 100 mg by mouth every evening.     Marland Kitchen ZOLPIDEM TARTRATE 10 MG PO TABS Oral Take 5 mg by mouth at bedtime as needed. For sleep      BP 131/70  Pulse 97  Temp 98.2 F (36.8 C) (Oral)  Resp 16  SpO2 92%  Physical Exam  Nursing note and vitals reviewed. Constitutional: He is oriented to person, place, and time. He appears well-developed. No distress.  HENT:  Head: Normocephalic and atraumatic.  Eyes: Conjunctivae and EOM are normal.  Cardiovascular: Normal rate and regular rhythm.   Pulmonary/Chest: Effort normal. No stridor. No respiratory distress.  Abdominal: He exhibits distension. He exhibits no fluid wave, no  ascites and no mass. There is no hepatosplenomegaly. There is generalized tenderness. There is rigidity and guarding. There is no rebound and no CVA tenderness.  Musculoskeletal: He exhibits no edema.  Neurological: He is alert and oriented to person, place, and time.  Skin: Skin is warm and dry.       Lower abnormal surgical scar well healing and appearance  Psychiatric: He has a normal mood and affect.    ED Course  Procedures (including critical care time)   Labs Reviewed  COMPREHENSIVE METABOLIC PANEL  CBC WITH DIFFERENTIAL  PROTIME-INR  LACTIC ACID, PLASMA   Dg Abd 2 Views  12/29/2011  *RADIOLOGY REPORT*  Clinical Data: Status post colonic resection for diverticulitis on 12/20/2011.  Abdominal pain.  ABDOMEN - 2 VIEW  Comparison: 12/24/2011.  Findings: Compared to the prior examination there is a significantly larger volume of pneumoperitoneum.  Previously noted dilated loops of small bowel persist, measuring up to 4.1 cm in diameter.  There is a paucity of colonic gas and no definite rectal gas identified.  As previously noted midline abdominal surgical staples have been removed.  IMPRESSION: 1.  Marked increase in volume of pneumoperitoneum compared to prior postoperative films 12/24/2011.  Findings are highly concerning for bowel perforation. 2.  Multiple dilated loops of gas-filled small bowel.  While this could simply represent an ileus, the possibility of a small bowel obstruction is difficult to exclude.  Clinical correlation is highly recommended.  These results were called by telephone on 12/29/2011 at 11:38 a.m. to Dr. Johna Sheriff, who verbally acknowledged these results. Per instructions of Dr. Johna Sheriff, the patient was directed to the [facility] emergency room.  Original Report Authenticated By: Florencia Reasons, M.D.     No diagnosis found.  Cardiac 90 sinus rhythm normal Pulse ox 96% room air normal   Soon after arrival I discussed the patient's case with his  surgical team.  We agreed the patient will have CT with contrast, then be reassessed.  MDM  This patient presents one week after surgical resection of complicated diverticular disease, now with increasing abdominal pain, by mouth intolerance.  On my exam the patient was in no distress, though he has a distended, tender abdomen.  The patient's presentation is concerning for abscess versus perforation.  After discussion with the patient's surgical team the patient had a CT scan performed.  This showed persistent pneumoperitoneum, likely ileus.  The patient was admitted to the surgical service for further evaluation and management     Gerhard Munch, MD 12/30/11 1656

## 2011-12-29 NOTE — H&P (Signed)
Chief Complaint:  Abdominal pain on plain x-ray 12 days after elective colectomy sigmoid colectomy  History of Present Illness:  Harry Carroll is an 51 y.o. male who underwent elective sigmoid colectomy about 12 days ago for symptomatic diverticulitis.  Over the last several days he has been having more abdominal discomfort.  When he presented to the office today for staple removal he was found to be tender in the abdomen and more distended.  Plain films show more free air than at discharge and he was referred for readmision and management.    Past Medical History  Diagnosis Date  . Colon polyps     adenomatous  . Diverticulitis   . Hyperlipemia   . Insomnia   . Arthritis   . Carpal tunnel syndrome   . Heart murmur     "EXTRA BEAT"  . Restless leg   . Sleep apnea     C-PAP NOT NEED FOR "MILD SLEEP APNEA"  . Depression     BIPOLAR    Past Surgical History  Procedure Date  . Maxillofacial surgery   . Right shoulder surgery     X2  . Right knee arthroscopy     2007  . Shoulder surgery     LEFT  . Partial colectomy 12/20/2011    Procedure: PARTIAL COLECTOMY;  Surgeon: Velora Heckler, MD;  Location: [facility];  Service: General;  Laterality: N/A;  Sigmoid colectomy  . Cystoscopy with urethral dilatation 12/20/2011    Procedure: CYSTOSCOPY WITH URETHRAL DILATATION;  Surgeon: Velora Heckler, MD;  Location: [facility];  Service: General;;  with foley insertion  . Cystoscopy with urethral dilatation 12/20/2011    Procedure: CYSTOSCOPY WITH URETHRAL DILATATION;  Surgeon: Marcine Matar, MD;  Location: [facility];  Service: Urology;;    Current Facility-Administered Medications  Medication Dose Route Frequency Provider Last Rate Last Dose  . sodium chloride 0.9 % bolus 1,000 mL  1,000 mL Intravenous Once Gerhard Munch, MD   1,000 mL at 12/29/11 1257   Current Outpatient Prescriptions  Medication Sig Dispense Refill  . atorvastatin (LIPITOR) 40 MG tablet Take 0.5 tablets (20 mg total) by mouth at  bedtime.  30 tablet  0  . divalproex (DEPAKOTE) 500 MG 24 hr tablet Take 1,000 mg by mouth every evening.       Marland Kitchen HYDROcodone-acetaminophen (NORCO) 5-325 MG per tablet Take 1-2 tablets by mouth every 4 (four) hours as needed.  30 tablet  1  . lamoTRIgine (LAMICTAL) 100 MG tablet Take 100 mg by mouth every evening.       . zolpidem (AMBIEN) 10 MG tablet Take 5 mg by mouth at bedtime as needed. For sleep       Ciprofloxacin; Dilaudid; and Hydrocodone Family History  Problem Relation Age of Onset  . Prostate cancer Paternal Grandfather   . Cancer Paternal Grandfather     prostate  . Cancer Paternal Aunt     lymph cancer   Social History:   reports that he has never smoked. His smokeless tobacco use includes Snuff. He reports that he drinks alcohol. He reports that he does not use illicit drugs.   REVIEW OF SYSTEMS - PERTINENT POSITIVES ONLY: noncontributory  Physical Exam:   Blood pressure 131/70, pulse 97, temperature 98.2 F (36.8 C), temperature source Oral, resp. rate 16, SpO2 92.00%. There is no height or weight on file to calculate BMI.  Gen:  WDWNWM NAD  Neurological: Alert and oriented to person, place, and time. Motor and  sensory function is grossly intact  Head: Normocephalic and atraumatic.  Eyes: Conjunctivae are normal. Pupils are equal, round, and reactive to light. No scleral icterus.  Neck: Normal range of motion. Neck supple. No tracheal deviation or thyromegaly present.  Cardiovascular:  SR without murmurs or gallops.  No carotid bruits Respiratory: Effort normal.  No respiratory distress. No chest wall tenderness. Breath sounds normal.  No wheezes, rales or rhonchi.  Abdomen:  Mildly distended.  Incision not infected.  Mildly tender.   GU: Musculoskeletal: Normal range of motion. Extremities are nontender. No cyanosis, edema or clubbing noted Lymphadenopathy: No cervical, preauricular, postauricular or axillary adenopathy is present Skin: Skin is warm and dry. No  rash noted. No diaphoresis. No erythema. No pallor. Pscyh: Normal mood and affect. Behavior is normal. Judgment and thought content normal.   LABORATORY RESULTS: Results for orders placed during the hospital encounter of 12/29/11 (from the past 48 hour(s))  COMPREHENSIVE METABOLIC PANEL     Status: Abnormal   Collection Time   12/29/11 12:50 PM      Component Value Range Comment   Sodium 131 (*) 135 - 145 mEq/L    Potassium 4.5  3.5 - 5.1 mEq/L    Chloride 93 (*) 96 - 112 mEq/L    CO2 24  19 - 32 mEq/L    Glucose, Bld 88  70 - 99 mg/dL    BUN 20  6 - 23 mg/dL    Creatinine, Ser 4.09  0.50 - 1.35 mg/dL    Calcium 9.4  8.4 - 81.1 mg/dL    Total Protein 6.7  6.0 - 8.3 g/dL    Albumin 3.3 (*) 3.5 - 5.2 g/dL    AST 14  0 - 37 U/L    ALT 30  0 - 53 U/L    Alkaline Phosphatase 47  39 - 117 U/L    Total Bilirubin 1.0  0.3 - 1.2 mg/dL    GFR calc non Af Amer 82 (*) >90 mL/min    GFR calc Af Amer >90  >90 mL/min   CBC WITH DIFFERENTIAL     Status: Normal   Collection Time   12/29/11 12:50 PM      Component Value Range Comment   WBC 8.0  4.0 - 10.5 K/uL    RBC 4.78  4.22 - 5.81 MIL/uL    Hemoglobin 14.5  13.0 - 17.0 g/dL    HCT 91.4  78.2 - 95.6 %    MCV 87.7  78.0 - 100.0 fL    MCH 30.3  26.0 - 34.0 pg    MCHC 34.6  30.0 - 36.0 g/dL    RDW 21.3  08.6 - 57.8 %    Platelets 203  150 - 400 K/uL    Neutrophils Relative 76  43 - 77 %    Neutro Abs 6.1  1.7 - 7.7 K/uL    Lymphocytes Relative 12  12 - 46 %    Lymphs Abs 1.0  0.7 - 4.0 K/uL    Monocytes Relative 10  3 - 12 %    Monocytes Absolute 0.8  0.1 - 1.0 K/uL    Eosinophils Relative 2  0 - 5 %    Eosinophils Absolute 0.2  0.0 - 0.7 K/uL    Basophils Relative 0  0 - 1 %    Basophils Absolute 0.0  0.0 - 0.1 K/uL   PROTIME-INR     Status: Normal   Collection Time   12/29/11 12:50 PM  Component Value Range Comment   Prothrombin Time 15.0  11.6 - 15.2 seconds    INR 1.16  0.00 - 1.49   LACTIC ACID, PLASMA     Status: Normal    Collection Time   12/29/11 12:50 PM      Component Value Range Comment   Lactic Acid, Venous 1.1  0.5 - 2.2 mmol/L     RADIOLOGY RESULTS: Dg Abd 2 Views  12/29/2011  *RADIOLOGY REPORT*  Clinical Data: Status post colonic resection for diverticulitis on 12/20/2011.  Abdominal pain.  ABDOMEN - 2 VIEW  Comparison: 12/24/2011.  Findings: Compared to the prior examination there is a significantly larger volume of pneumoperitoneum.  Previously noted dilated loops of small bowel persist, measuring up to 4.1 cm in diameter.  There is a paucity of colonic gas and no definite rectal gas identified.  As previously noted midline abdominal surgical staples have been removed.  IMPRESSION: 1.  Marked increase in volume of pneumoperitoneum compared to prior postoperative films 12/24/2011.  Findings are highly concerning for bowel perforation. 2.  Multiple dilated loops of gas-filled small bowel.  While this could simply represent an ileus, the possibility of a small bowel obstruction is difficult to exclude.  Clinical correlation is highly recommended.  These results were called by telephone on 12/29/2011 at 11:38 a.m. to Dr. Johna Sheriff, who verbally acknowledged these results. Per instructions of Dr. Johna Sheriff, the patient was directed to the [facility] emergency room.  Original Report Authenticated By: Florencia Reasons, M.D.    Problem List: Patient Active Problem List  Diagnosis  . COLONIC POLYPS, ADENOMATOUS  . HYPERTRIGLYCERIDEMIA  . HYPERLIPIDEMIA  . BIPOLAR DISORDER UNSPECIFIED  . DEPRESSION  . DIVERTICULITIS OF COLON  . INSOMNIA  . LIVER FUNCTION TESTS, ABNORMAL, HX OF  . Personal history of colonic polyps  . General medical examination  . Fatigue  . OSA (obstructive sleep apnea)  . Diarrhea  . Diverticulitis-post resection July 2013    Assessment & Plan: Will obtain CT scan to assess for possible leak from anastomosis    Matt B. Daphine Deutscher, MD, [facility] [phone] beeper [phone]  12/29/2011 2:33 PM

## 2011-12-29 NOTE — Progress Notes (Signed)
History: Patient returns to the office 9 days following left colectomy. He was doing well at discharge but states that over the past 2 days he has had significant crampy abdominal pain that seems to move around his abdomen. He's not had any nausea or vomiting but his appetite is poor. He has not had any flatus or bowel movements in the last 2 days.  Exam   afebrile  Gen.: He appears a little uncomfortable but not acutely ill Abdomen: Wound is well-healed without evidence of infection. Bowel sounds are hypoactive. His abdomen is mildly to moderately distended and somewhat tympanitic. There is mild-to-moderate tenderness mostly in the right mid abdomen.  Assessment and plan: Status post colectomy. Possible ileus, rule out small bowel obstruction. We will get some plain abdominal x-rays and CBC today and he will be called with the results. Cut back to liquid only diet. Dulcolax suppository b.i.d. If his symptoms persist or worsen in any way he will call us and he could need admission.

## 2011-12-29 NOTE — ED Notes (Signed)
Had surgery 1 week ago, home on Sunday, severe pain started yesterday. Had an xray this am, told to come straight here-- wife states questionable bowel perforation

## 2011-12-29 NOTE — ED Notes (Signed)
Pt states he had a sigmoid colonectomy done on the 9th, d/c'd from hospital on Sunday, since then has been having some lower abdominal pain, denies nausea, vomiting or diarrhea today, states was nauseous yesterday, went for a follow-up appointment this morning, had staples removed, then went for xray, after xray was told he had a perforated bowel and needed to come to the ER. Pt states pain is worse w/ movement. Pt a/o x 4.

## 2011-12-29 NOTE — ED Notes (Signed)
Returned from CT.

## 2011-12-30 LAB — COMPREHENSIVE METABOLIC PANEL
Albumin: 2.8 g/dL — ABNORMAL LOW (ref 3.5–5.2)
Alkaline Phosphatase: 41 U/L (ref 39–117)
BUN: 14 mg/dL (ref 6–23)
CO2: 27 mEq/L (ref 19–32)
Chloride: 92 mEq/L — ABNORMAL LOW (ref 96–112)
GFR calc non Af Amer: 77 mL/min — ABNORMAL LOW (ref >90)
Glucose, Bld: 115 mg/dL — ABNORMAL HIGH (ref 70–99)
Potassium: 3.8 mEq/L (ref 3.5–5.1)
Total Bilirubin: 0.8 mg/dL (ref 0.3–1.2)

## 2011-12-30 LAB — CBC WITH DIFFERENTIAL/PLATELET
Basophils Absolute: 0 10*3/uL (ref 0.0–0.1)
Eosinophils Absolute: 0.2 10*3/uL (ref 0.0–0.7)
Eosinophils Relative: 3 % (ref 0–5)
Lymphocytes Relative: 12 % (ref 12–46)
MCV: 86.6 fL (ref 78.0–100.0)
Neutrophils Relative %: 72 % (ref 43–77)
Platelets: 185 10*3/uL (ref 150–400)
RDW: 12.6 % (ref 11.5–15.5)
WBC: 6.3 10*3/uL (ref 4.0–10.5)

## 2011-12-30 MED ORDER — ZOLPIDEM TARTRATE 5 MG PO TABS: 5.0000 mg | ORAL_TABLET | Freq: Every evening | ORAL | Status: DC | PRN

## 2011-12-30 MED ORDER — DIVALPROEX SODIUM ER 500 MG PO TB24
1000.0000 mg | ORAL_TABLET | Freq: Every day | ORAL | Status: DC
  Administered 2011-12-30 – 2011-12-31 (×2): 1000 mg via ORAL
  Filled 2011-12-30 (×4): qty 2

## 2011-12-30 MED ORDER — LAMOTRIGINE 100 MG PO TABS
100.0000 mg | ORAL_TABLET | Freq: Every day | ORAL | Status: DC
  Administered 2011-12-30 – 2011-12-31 (×2): 100 mg via ORAL
  Filled 2011-12-30 (×4): qty 1

## 2011-12-30 NOTE — Progress Notes (Signed)
Patient ID: Harry Carroll, male   DOB: 1960/07/18, 51 y.o.   MRN: [id number] [facility] Progress Note:   * No surgery found *  Subjective: Mental status is clear Objective: Vital signs in last 24 hours: Temp:  [98.2 F (36.8 C)-99.5 F (37.5 C)] 98.8 F (37.1 C) (07/19 0615) Pulse Rate:  [78-98] 86  (07/19 0615) Resp:  [16] 16  (07/19 0615) BP: (109-142)/(59-84) 129/78 mmHg (07/19 0615) SpO2:  [92 %-98 %] 96 % (07/19 0615) Weight:  [239 lb (108.41 kg)] 239 lb (108.41 kg) (07/18 2054)  Intake/Output from previous day: 07/18 0701 - 07/19 0700 In: 1223.3 [I.V.:1223.3] Out: 950 [Urine:950] Intake/Output this shift:    Physical Exam: Work of breathing is  Normal.  Abdomen is less distended and he reports passing a lot of flatus during the night.  Nontender abdomen.  WBC is not elevated and VSS.    Lab Results:  Results for orders placed during the hospital encounter of 12/29/11 (from the past 48 hour(s))  COMPREHENSIVE METABOLIC PANEL     Status: Abnormal   Collection Time   12/29/11 12:50 PM      Component Value Range Comment   Sodium 131 (*) 135 - 145 mEq/L    Potassium 4.5  3.5 - 5.1 mEq/L    Chloride 93 (*) 96 - 112 mEq/L    CO2 24  19 - 32 mEq/L    Glucose, Bld 88  70 - 99 mg/dL    BUN 20  6 - 23 mg/dL    Creatinine, Ser 2.13  0.50 - 1.35 mg/dL    Calcium 9.4  8.4 - 08.6 mg/dL    Total Protein 6.7  6.0 - 8.3 g/dL    Albumin 3.3 (*) 3.5 - 5.2 g/dL    AST 14  0 - 37 U/L    ALT 30  0 - 53 U/L    Alkaline Phosphatase 47  39 - 117 U/L    Total Bilirubin 1.0  0.3 - 1.2 mg/dL    GFR calc non Af Amer 82 (*) >90 mL/min    GFR calc Af Amer >90  >90 mL/min   CBC WITH DIFFERENTIAL     Status: Normal   Collection Time   12/29/11 12:50 PM      Component Value Range Comment   WBC 8.0  4.0 - 10.5 K/uL    RBC 4.78  4.22 - 5.81 MIL/uL    Hemoglobin 14.5  13.0 - 17.0 g/dL    HCT 57.8  46.9 - 62.9 %    MCV 87.7  78.0 - 100.0 fL    MCH 30.3  26.0 - 34.0 pg    MCHC 34.6   30.0 - 36.0 g/dL    RDW 52.8  41.3 - 24.4 %    Platelets 203  150 - 400 K/uL    Neutrophils Relative 76  43 - 77 %    Neutro Abs 6.1  1.7 - 7.7 K/uL    Lymphocytes Relative 12  12 - 46 %    Lymphs Abs 1.0  0.7 - 4.0 K/uL    Monocytes Relative 10  3 - 12 %    Monocytes Absolute 0.8  0.1 - 1.0 K/uL    Eosinophils Relative 2  0 - 5 %    Eosinophils Absolute 0.2  0.0 - 0.7 K/uL    Basophils Relative 0  0 - 1 %    Basophils Absolute 0.0  0.0 - 0.1 K/uL  PROTIME-INR     Status: Normal   Collection Time   12/29/11 12:50 PM      Component Value Range Comment   Prothrombin Time 15.0  11.6 - 15.2 seconds    INR 1.16  0.00 - 1.49   LACTIC ACID, PLASMA     Status: Normal   Collection Time   12/29/11 12:50 PM      Component Value Range Comment   Lactic Acid, Venous 1.1  0.5 - 2.2 mmol/L   COMPREHENSIVE METABOLIC PANEL     Status: Abnormal   Collection Time   12/30/11  4:00 AM      Component Value Range Comment   Sodium 129 (*) 135 - 145 mEq/L    Potassium 3.8  3.5 - 5.1 mEq/L    Chloride 92 (*) 96 - 112 mEq/L    CO2 27  19 - 32 mEq/L    Glucose, Bld 115 (*) 70 - 99 mg/dL    BUN 14  6 - 23 mg/dL    Creatinine, Ser 4.78  0.50 - 1.35 mg/dL    Calcium 8.8  8.4 - 29.5 mg/dL    Total Protein 6.1  6.0 - 8.3 g/dL    Albumin 2.8 (*) 3.5 - 5.2 g/dL    AST 12  0 - 37 U/L    ALT 20  0 - 53 U/L    Alkaline Phosphatase 41  39 - 117 U/L    Total Bilirubin 0.8  0.3 - 1.2 mg/dL    GFR calc non Af Amer 77 (*) >90 mL/min    GFR calc Af Amer 89 (*) >90 mL/min   CBC WITH DIFFERENTIAL     Status: Abnormal   Collection Time   12/30/11  4:07 AM      Component Value Range Comment   WBC 6.3  4.0 - 10.5 K/uL    RBC 4.19 (*) 4.22 - 5.81 MIL/uL    Hemoglobin 12.8 (*) 13.0 - 17.0 g/dL    HCT 62.1 (*) 30.8 - 52.0 %    MCV 86.6  78.0 - 100.0 fL    MCH 30.5  26.0 - 34.0 pg    MCHC 35.3  30.0 - 36.0 g/dL    RDW 65.7  84.6 - 96.2 %    Platelets 185  150 - 400 K/uL    Neutrophils Relative 72  43 - 77 %     Neutro Abs 4.5  1.7 - 7.7 K/uL    Lymphocytes Relative 12  12 - 46 %    Lymphs Abs 0.8  0.7 - 4.0 K/uL    Monocytes Relative 14 (*) 3 - 12 %    Monocytes Absolute 0.9  0.1 - 1.0 K/uL    Eosinophils Relative 3  0 - 5 %    Eosinophils Absolute 0.2  0.0 - 0.7 K/uL    Basophils Relative 0  0 - 1 %    Basophils Absolute 0.0  0.0 - 0.1 K/uL     Radiology/Results: Ct Abdomen Pelvis W Contrast  12/29/2011  *RADIOLOGY REPORT*  Clinical Data: Severe abdominal pain and distention. 9 days postop from sigmoid colectomy for diverticulitis.  CT ABDOMEN AND PELVIS WITH CONTRAST  Technique:  Multidetector CT imaging of the abdomen and pelvis was performed following the standard protocol during bolus administration of intravenous contrast.  Contrast: OMNIPAQUE IOHEXOL 300 MG/ML  SOLN  Comparison: 11/15/2011 and 12/24/2011  Findings: The patient has undergone partial sigmoid colectomy since prior study.  There  is a large amount of free intraperitoneal air which is greater than expected 9 days postoperatively.  Gaseous distention of multiple small bowel loops and colon is seen consistent with ileus.  There is a small amount of extraluminal gas seen adjacent to the sigmoid and descending portions of the colon, but no evidence of significant bowel wall thickening or obstruction.  A small amount of free fluid is seen in the pelvic cul-de-sac.  Tiny gallstones are again noted, without evidence of acute cholecystitis.  The abdominal parenchymal organs are normal appearance.  No soft tissue masses or lymphadenopathy identified. No focal inflammatory process is seen.  Diffuse bladder wall thickening is again demonstrated, and may be due to chronic bladder outlet suction or chronic cystitis.  IMPRESSION:  1.  Large amount of free intraperitoneal air, which is greater than expected 9 days postoperatively.  This also appears to be increased since previous postoperative radiographs on 12/24/2011. Anastomotic leak or recurrent  bowel perforation cannot be excluded. 2.  Small amount of free fluid in the pelvic cul-de-sac.  No definite abscess identified. 3.  Postoperative ileus. 4.  Chronic bladder wall thickening, likely due to chronic bladder outlet obstruction or chronic cystitis.  Critical Value/emergent results were called by telephone at the time of interpretation on 12/29/2011 at 1450 hours to Dr. Jeraldine Loots in the ED, who verbally acknowledged these results.  Original Report Authenticated By: Danae Orleans, M.D.   Dg Abd 2 Views  12/29/2011  *RADIOLOGY REPORT*  Clinical Data: Status post colonic resection for diverticulitis on 12/20/2011.  Abdominal pain.  ABDOMEN - 2 VIEW  Comparison: 12/24/2011.  Findings: Compared to the prior examination there is a significantly larger volume of pneumoperitoneum.  Previously noted dilated loops of small bowel persist, measuring up to 4.1 cm in diameter.  There is a paucity of colonic gas and no definite rectal gas identified.  As previously noted midline abdominal surgical staples have been removed.  IMPRESSION: 1.  Marked increase in volume of pneumoperitoneum compared to prior postoperative films 12/24/2011.  Findings are highly concerning for bowel perforation. 2.  Multiple dilated loops of gas-filled small bowel.  While this could simply represent an ileus, the possibility of a small bowel obstruction is difficult to exclude.  Clinical correlation is highly recommended.  These results were called by telephone on 12/29/2011 at 11:38 a.m. to Dr. Johna Sheriff, who verbally acknowledged these results. Per instructions of Dr. Johna Sheriff, the patient was directed to the [facility] emergency room.  Original Report Authenticated By: Florencia Reasons, M.D.    Anti-infectives: Anti-infectives     Start     Dose/Rate Route Frequency Ordered Stop   12/29/11 2000  piperacillin-tazobactam (ZOSYN) IVPB 3.375 g       3.375 g 12.5 mL/hr over 240 Minutes Intravenous 3 times per day 12/29/11  1944            Assessment/Plan: Problem List: Patient Active Problem List  Diagnosis  . COLONIC POLYPS, ADENOMATOUS  . HYPERTRIGLYCERIDEMIA  . HYPERLIPIDEMIA  . BIPOLAR DISORDER UNSPECIFIED  . DEPRESSION  . DIVERTICULITIS OF COLON  . INSOMNIA  . LIVER FUNCTION TESTS, ABNORMAL, HX OF  . Personal history of colonic polyps  . General medical examination  . Fatigue  . OSA (obstructive sleep apnea)  . Diarrhea  . Diverticulitis-post resection July 2013    Patient was seen last night by Dr. Ezzard Standing and we conferred about his status.  The "free air" noted on his CT is worrisome but his clinical  appearance is not toxic.  Will request a followup CT today to look if contrast has extravasated from anastomosis.  Will continue NPO and IV Zosyn for now.  * No surgery found *    LOS: 1 day   Matt B. Daphine Deutscher, MD, [facility] [phone] beeper [phone]  12/30/2011 8:51 AM

## 2011-12-30 NOTE — Care Management Note (Signed)
    Page 1 of 2   01/02/2012     5:04:53 PM   CARE MANAGEMENT NOTE 01/02/2012  Patient:  KALDEN, WANKE   Account Number:  [id number]  Date Initiated:  12/30/2011  Documentation initiated by:  Lorenda Ishihara  Subjective/Objective Assessment:   51 yo male admitted wiith abd pain s/p colectomy for diverticular dz on 12-20-11. PTA lived at home with spouse.     Action/Plan:   Follow for d/c needs   Anticipated DC Date:  01/02/2012   Anticipated DC Plan:  HOME/SELF CARE      DC Planning Services  CM consult      PAC Choice  NA   Choice offered to / List presented to:  NA   DME arranged  NA      DME agency  NA     HH arranged  NA      HH agency  NA   Status of service:  Completed, signed off Medicare Important Message given?   (If response is "NO", the following Medicare IM given date fields will be blank) Date Medicare IM given:   Date Additional Medicare IM given:    Discharge Disposition:  HOME/SELF CARE  Per UR Regulation:  Reviewed for med. necessity/level of care/duration of stay  If discussed at Long Length of Stay Meetings, dates discussed:    Comments:

## 2011-12-31 DIAGNOSIS — K668 Other specified disorders of peritoneum: Secondary | ICD-10-CM | POA: Diagnosis present

## 2011-12-31 MED ORDER — PROMETHAZINE HCL 25 MG/ML IJ SOLN: 12.5000 mg | Freq: Four times a day (QID) | INTRAMUSCULAR | Status: DC | PRN

## 2011-12-31 MED ORDER — MORPHINE SULFATE 2 MG/ML IJ SOLN: 1.0000 mg | INTRAMUSCULAR | Status: DC | PRN

## 2011-12-31 MED ORDER — ONDANSETRON HCL 4 MG/2ML IJ SOLN: 4.0000 mg | Freq: Four times a day (QID) | INTRAMUSCULAR | Status: DC | PRN

## 2011-12-31 MED ORDER — LACTATED RINGERS IV BOLUS (SEPSIS): 1000.0000 mL | Freq: Three times a day (TID) | INTRAVENOUS | Status: DC | PRN

## 2011-12-31 MED ORDER — MAGIC MOUTHWASH
15.0000 mL | Freq: Four times a day (QID) | ORAL | Status: DC | PRN
  Filled 2011-12-31: qty 15

## 2011-12-31 MED ORDER — SACCHAROMYCES BOULARDII 250 MG PO CAPS
250.0000 mg | ORAL_CAPSULE | Freq: Two times a day (BID) | ORAL | Status: DC
  Administered 2011-12-31 – 2012-01-02 (×4): 250 mg via ORAL
  Filled 2011-12-31 (×6): qty 1

## 2011-12-31 MED ORDER — PANTOPRAZOLE SODIUM 40 MG PO TBEC: 40.0000 mg | DELAYED_RELEASE_TABLET | Freq: Two times a day (BID) | ORAL | Status: DC | PRN

## 2011-12-31 MED ORDER — ALUM & MAG HYDROXIDE-SIMETH 200-200-20 MG/5ML PO SUSP: 30.0000 mL | Freq: Four times a day (QID) | ORAL | Status: DC | PRN

## 2011-12-31 MED ORDER — ACETAMINOPHEN 325 MG PO TABS: 650.0000 mg | ORAL_TABLET | Freq: Four times a day (QID) | ORAL | Status: DC | PRN

## 2011-12-31 NOTE — Progress Notes (Signed)
Harry Carroll [id number] December 09, 1960  CARE TEAM:  PCP: Willow Ora, MD  Outpatient Care Team: Patient Care Team: Wanda Plump, MD as PCP - General  Inpatient Treatment Team: Treatment Team: Attending Provider: Bishop Limbo, MD; Rounding Team: Md Montez Morita, MD; Technician: Joellyn Haff, NT; Registered Nurse: Lyla Son, RN; Technician: Burnard Bunting, [place]; Registered Nurse: Sherron Monday, RN; Consulting Physician: Velora Heckler, MD  Subjective:  Feels much better Denies abdominal pain Wife & mother-in-law in room Many BMs yesterday No N/V   Objective:  Vital signs:  Filed Vitals:   12/30/11 1400 12/30/11 1800 12/30/11 2230 12/31/11 0515  BP: 122/79 124/79 119/77 115/79  Pulse: 81 83 82 78  Temp: 98.6 F (37 C) 99.2 F (37.3 C) 98.8 F (37.1 C) 98 F (36.7 C)  TempSrc: Oral Oral Oral Oral  Resp: 17 18 18 18   Height:      Weight:      SpO2: 96% 96% 95% 97%    Last BM Date: 12/27/11  Intake/Output   Yesterday:  07/19 0701 - 07/20 0700 In: 3705 [I.V.:3705] Out: 4098 [Urine:4075] This shift:     Bowel function:  Flatus: y  BM: y  Physical Exam:  General: Pt awake/alert/oriented x4 in no acute distress Eyes: PERRL, normal EOM.  Sclera clear.  No icterus Neuro: CN II-XII intact w/o focal sensory/motor deficits. Lymph: No head/neck/groin lymphadenopathy Psych:  No delerium/psychosis/paranoia HENT: Normocephalic, Mucus membranes moist.  No thrush Neck: Supple, No tracheal deviation Chest: No chest wall pain w good excursion CV:  Pulses intact.  Regular rhythm Abdomen: Soft.  Minimally distended/Obese.  Incision closed - no cellulitis.  Nontender.  No pain with cough/bedshake.  No incarcerated hernias. Ext:  SCDs BLE.  No mjr edema.  No cyanosis Skin: No petechiae / purpurae  Problem List:  Principal Problem:  *Pneumoperitoneum, most likely postoperative Active Problems:  BIPOLAR DISORDER UNSPECIFIED  DEPRESSION  INSOMNIA  Personal history of  colonic polyps  OSA (obstructive sleep apnea)  Diverticulitis-post resection July 2013   Assessment  Harry Carroll  51 y.o. male       Ileus resolving.  No evidence of peritonitis  Plan:  -try PO liquids -continue IV ABx for now  The patient is stable.  There is no evidence of peritonitis, acute abdomen, nor shock.  There is no strong evidence of failure of improvement nor decline with current non-operative management.  There is no need for surgery at the present moment.  We will continue to follow.  -if worse - consider repeat CT w aspiration of pneumoperitoneum vs ex lap to r/o leak.   -bipolar d/o stable on home meds -insomnia controlled -VTE prophylaxis- SCDs, etc -mobilize as tolerated to help recovery  Ardeth Sportsman, M.D., F.A.C.S. Gastrointestinal and Minimally Invasive Surgery [facility] [address], [address] [place], [place] [address] [phone] Main / Paging [phone] Voice Mail   12/31/2011  Results:   Labs: Results for orders placed during the hospital encounter of 12/29/11 (from the past 48 hour(s))  COMPREHENSIVE METABOLIC PANEL     Status: Abnormal   Collection Time   12/29/11 12:50 PM      Component Value Range Comment   Sodium 131 (*) 135 - 145 mEq/L    Potassium 4.5  3.5 - 5.1 mEq/L    Chloride 93 (*) 96 - 112 mEq/L    CO2 24  19 - 32 mEq/L    Glucose, Bld 88  70 - 99 mg/dL    BUN 20  6 - 23 mg/dL    Creatinine, Ser 1.61  0.50 - 1.35 mg/dL    Calcium 9.4  8.4 - 09.6 mg/dL    Total Protein 6.7  6.0 - 8.3 g/dL    Albumin 3.3 (*) 3.5 - 5.2 g/dL    AST 14  0 - 37 U/L    ALT 30  0 - 53 U/L    Alkaline Phosphatase 47  39 - 117 U/L    Total Bilirubin 1.0  0.3 - 1.2 mg/dL    GFR calc non Af Amer 82 (*) >90 mL/min    GFR calc Af Amer >90  >90 mL/min   CBC WITH DIFFERENTIAL     Status: Normal   Collection Time   12/29/11 12:50 PM      Component Value Range Comment   WBC 8.0  4.0 - 10.5 K/uL    RBC 4.78  4.22 -  5.81 MIL/uL    Hemoglobin 14.5  13.0 - 17.0 g/dL    HCT 04.5  40.9 - 81.1 %    MCV 87.7  78.0 - 100.0 fL    MCH 30.3  26.0 - 34.0 pg    MCHC 34.6  30.0 - 36.0 g/dL    RDW 91.4  78.2 - 95.6 %    Platelets 203  150 - 400 K/uL    Neutrophils Relative 76  43 - 77 %    Neutro Abs 6.1  1.7 - 7.7 K/uL    Lymphocytes Relative 12  12 - 46 %    Lymphs Abs 1.0  0.7 - 4.0 K/uL    Monocytes Relative 10  3 - 12 %    Monocytes Absolute 0.8  0.1 - 1.0 K/uL    Eosinophils Relative 2  0 - 5 %    Eosinophils Absolute 0.2  0.0 - 0.7 K/uL    Basophils Relative 0  0 - 1 %    Basophils Absolute 0.0  0.0 - 0.1 K/uL   PROTIME-INR     Status: Normal   Collection Time   12/29/11 12:50 PM      Component Value Range Comment   Prothrombin Time 15.0  11.6 - 15.2 seconds    INR 1.16  0.00 - 1.49   LACTIC ACID, PLASMA     Status: Normal   Collection Time   12/29/11 12:50 PM      Component Value Range Comment   Lactic Acid, Venous 1.1  0.5 - 2.2 mmol/L   COMPREHENSIVE METABOLIC PANEL     Status: Abnormal   Collection Time   12/30/11  4:00 AM      Component Value Range Comment   Sodium 129 (*) 135 - 145 mEq/L    Potassium 3.8  3.5 - 5.1 mEq/L    Chloride 92 (*) 96 - 112 mEq/L    CO2 27  19 - 32 mEq/L    Glucose, Bld 115 (*) 70 - 99 mg/dL    BUN 14  6 - 23 mg/dL    Creatinine, Ser 2.13  0.50 - 1.35 mg/dL    Calcium 8.8  8.4 - 08.6 mg/dL    Total Protein 6.1  6.0 - 8.3 g/dL    Albumin 2.8 (*) 3.5 - 5.2 g/dL    AST 12  0 - 37 U/L    ALT 20  0 - 53 U/L    Alkaline Phosphatase 41  39 - 117 U/L  Total Bilirubin 0.8  0.3 - 1.2 mg/dL    GFR calc non Af Amer 77 (*) >90 mL/min    GFR calc Af Amer 89 (*) >90 mL/min   CBC WITH DIFFERENTIAL     Status: Abnormal   Collection Time   12/30/11  4:07 AM      Component Value Range Comment   WBC 6.3  4.0 - 10.5 K/uL    RBC 4.19 (*) 4.22 - 5.81 MIL/uL    Hemoglobin 12.8 (*) 13.0 - 17.0 g/dL    HCT 16.1 (*) 09.6 - 52.0 %    MCV 86.6  78.0 - 100.0 fL    MCH 30.5  26.0  - 34.0 pg    MCHC 35.3  30.0 - 36.0 g/dL    RDW 04.5  40.9 - 81.1 %    Platelets 185  150 - 400 K/uL    Neutrophils Relative 72  43 - 77 %    Neutro Abs 4.5  1.7 - 7.7 K/uL    Lymphocytes Relative 12  12 - 46 %    Lymphs Abs 0.8  0.7 - 4.0 K/uL    Monocytes Relative 14 (*) 3 - 12 %    Monocytes Absolute 0.9  0.1 - 1.0 K/uL    Eosinophils Relative 3  0 - 5 %    Eosinophils Absolute 0.2  0.0 - 0.7 K/uL    Basophils Relative 0  0 - 1 %    Basophils Absolute 0.0  0.0 - 0.1 K/uL     Imaging / Studies: Ct Abdomen Pelvis W Contrast  12/29/2011  *RADIOLOGY REPORT*  Clinical Data: Severe abdominal pain and distention. 9 days postop from sigmoid colectomy for diverticulitis.  CT ABDOMEN AND PELVIS WITH CONTRAST  Technique:  Multidetector CT imaging of the abdomen and pelvis was performed following the standard protocol during bolus administration of intravenous contrast.  Contrast: OMNIPAQUE IOHEXOL 300 MG/ML  SOLN  Comparison: 11/15/2011 and 12/24/2011  Findings: The patient has undergone partial sigmoid colectomy since prior study.  There is a large amount of free intraperitoneal air which is greater than expected 9 days postoperatively.  Gaseous distention of multiple small bowel loops and colon is seen consistent with ileus.  There is a small amount of extraluminal gas seen adjacent to the sigmoid and descending portions of the colon, but no evidence of significant bowel wall thickening or obstruction.  A small amount of free fluid is seen in the pelvic cul-de-sac.  Tiny gallstones are again noted, without evidence of acute cholecystitis.  The abdominal parenchymal organs are normal appearance.  No soft tissue masses or lymphadenopathy identified. No focal inflammatory process is seen.  Diffuse bladder wall thickening is again demonstrated, and may be due to chronic bladder outlet suction or chronic cystitis.  IMPRESSION:  1.  Large amount of free intraperitoneal air, which is greater than expected  9 days postoperatively.  This also appears to be increased since previous postoperative radiographs on 12/24/2011. Anastomotic leak or recurrent bowel perforation cannot be excluded. 2.  Small amount of free fluid in the pelvic cul-de-sac.  No definite abscess identified. 3.  Postoperative ileus. 4.  Chronic bladder wall thickening, likely due to chronic bladder outlet obstruction or chronic cystitis.  Critical Value/emergent results were called by telephone at the time of interpretation on 12/29/2011 at 1450 hours to Dr. Jeraldine Loots in the ED, who verbally acknowledged these results.  Original Report Authenticated By: Danae Orleans, M.D.   Dg Abd 2  Views  12/29/2011  *RADIOLOGY REPORT*  Clinical Data: Status post colonic resection for diverticulitis on 12/20/2011.  Abdominal pain.  ABDOMEN - 2 VIEW  Comparison: 12/24/2011.  Findings: Compared to the prior examination there is a significantly larger volume of pneumoperitoneum.  Previously noted dilated loops of small bowel persist, measuring up to 4.1 cm in diameter.  There is a paucity of colonic gas and no definite rectal gas identified.  As previously noted midline abdominal surgical staples have been removed.  IMPRESSION: 1.  Marked increase in volume of pneumoperitoneum compared to prior postoperative films 12/24/2011.  Findings are highly concerning for bowel perforation. 2.  Multiple dilated loops of gas-filled small bowel.  While this could simply represent an ileus, the possibility of a small bowel obstruction is difficult to exclude.  Clinical correlation is highly recommended.  These results were called by telephone on 12/29/2011 at 11:38 a.m. to Dr. Johna Sheriff, who verbally acknowledged these results. Per instructions of Dr. Johna Sheriff, the patient was directed to the [facility] emergency room.  Original Report Authenticated By: Florencia Reasons, M.D.    Medications / Allergies: per chart  Antibiotics: Anti-infectives     Start      Dose/Rate Route Frequency Ordered Stop   12/29/11 2000  piperacillin-tazobactam (ZOSYN) IVPB 3.375 g       3.375 g 12.5 mL/hr over 240 Minutes Intravenous 3 times per day 12/29/11 1944

## 2012-01-01 DIAGNOSIS — K567 Ileus, unspecified: Secondary | ICD-10-CM | POA: Diagnosis present

## 2012-01-01 DIAGNOSIS — K9189 Other postprocedural complications and disorders of digestive system: Secondary | ICD-10-CM | POA: Diagnosis present

## 2012-01-01 NOTE — Progress Notes (Addendum)
Harry Carroll [id number] Nov 20, 1960  CARE TEAM:  PCP: Harry Ora, Harry  Outpatient Care Team: Patient Care Team: Harry Plump, Harry as PCP - General  Inpatient Treatment Team: Treatment Team: Attending Provider: Bishop Limbo, Harry; Rounding Team: Harry Montez Morita, Harry; Technician: Harry Carroll, NT; Registered Nurse: Harry Son, RN; Registered Nurse: Harry Monday, RN; Consulting Physician: Harry Heckler, Harry; Technician: Harry Carroll, NT  Subjective:  Feels much better Denies abdominal pain but feels "shifting" of upper abdominal contents 2 BMs yesterday No N/V Walking in hallways  Objective:  Vital signs:  Filed Vitals:   12/31/11 0515 12/31/11 1400 12/31/11 2148 01/01/12 0520  BP: 115/79 126/87 138/81 123/78  Pulse: 78 64 104 71  Temp: 98 F (36.7 C) 99.4 F (37.4 C) 97.6 F (36.4 C) 98.4 F (36.9 C)  TempSrc: Oral Oral Oral Oral  Resp: 18 16 18 16   Height:      Weight:      SpO2: 97% 97% 98% 99%    Last BM Date: 12/31/11  Intake/Output   Yesterday:  07/20 0701 - 07/21 0700 In: 3043.8 [P.O.:360; I.V.:2383.8; IV Piggyback:300] Out: 3250 [Urine:3250] This shift:     Bowel function:  Flatus: y  BM: y  Physical Exam:  General: Pt awake/alert/oriented x4 in no acute distress Eyes: PERRL, normal EOM.  Sclera clear.  No icterus Neuro: CN II-XII intact w/o focal sensory/motor deficits. Lymph: No head/neck/groin lymphadenopathy Psych:  No delerium/psychosis/paranoia HENT: Normocephalic, Mucus membranes moist.  No thrush Neck: Supple, No tracheal deviation Chest: No chest wall pain w good excursion CV:  Pulses intact.  Regular rhythm Abdomen: Soft.  Obese.  Supraumbilical diastasis recti unchanged.  Incision closed - no cellulitis.  Nontender.  No pain with cough/bedshake.  No incarcerated hernias. Ext:  SCDs BLE.  No mjr edema.  No cyanosis Skin: No petechiae / purpurae  Problem List:  Principal Problem:  *Pneumoperitoneum, most likely postoperative Active  Problems:  BIPOLAR DISORDER UNSPECIFIED  DEPRESSION  INSOMNIA  Personal history of colonic polyps  OSA (obstructive sleep apnea)  Diverticulitis-post resection July 2013   Assessment  Harry Carroll  51 y.o. male       Ileus resolving.  No evidence of peritonitis on IV ABx  Plan:  -adv diet to solids -d/c IVF -continue IV ABx for now  The patient is stable.  There is no evidence of peritonitis, acute abdomen, nor shock.  There is no strong evidence of failure of improvement nor decline with current non-operative management.  There is no need for surgery at the present moment.  We will continue to follow.  -if worse - consider repeat CT w aspiration of pneumoperitoneum vs ex lap to r/o leak.   -if better/stable - prob d/c tomorrow on PO ABx.   -bipolar d/o stable on home meds -insomnia controlled -VTE prophylaxis- SCDs, etc -mobilize as tolerated to help recovery  Harry Carroll, M.D., F.A.C.S. Gastrointestinal and Minimally Invasive Surgery [facility] [address] [place], [place] [address] [phone] Main / Paging [phone] Voice Mail   01/01/2012  Results:   Labs: No results found for this or any previous visit (from the past 48 hour(s)).  Imaging / Studies: No results found.  Medications / Allergies: per chart  Antibiotics: Anti-infectives     Start     Dose/Rate Route Frequency Ordered Stop   12/29/11 2000  piperacillin-tazobactam (ZOSYN) IVPB 3.375 g       3.375  g 12.5 mL/hr over 240 Minutes Intravenous 3 times per day 12/29/11 1944

## 2012-01-02 ENCOUNTER — Encounter (INDEPENDENT_AMBULATORY_CARE_PROVIDER_SITE_OTHER): Payer: 59 | Admitting: General Surgery

## 2012-01-02 LAB — BASIC METABOLIC PANEL
Calcium: 9.3 mg/dL (ref 8.4–10.5)
GFR calc non Af Amer: 81 mL/min — ABNORMAL LOW (ref >90)
Glucose, Bld: 104 mg/dL — ABNORMAL HIGH (ref 70–99)
Potassium: 4 mEq/L (ref 3.5–5.1)
Sodium: 137 mEq/L (ref 135–145)

## 2012-01-02 LAB — CBC
Hemoglobin: 13.3 g/dL (ref 13.0–17.0)
MCH: 30.2 pg (ref 26.0–34.0)
MCHC: 34.5 g/dL (ref 30.0–36.0)
Platelets: 304 10*3/uL (ref 150–400)
RBC: 4.4 MIL/uL (ref 4.22–5.81)

## 2012-01-02 MED ORDER — SACCHAROMYCES BOULARDII 250 MG PO CAPS: 250.0000 mg | ORAL_CAPSULE | Freq: Two times a day (BID) | ORAL | Status: AC

## 2012-01-02 MED ORDER — ACETAMINOPHEN 325 MG PO TABS: 650.0000 mg | ORAL_TABLET | Freq: Four times a day (QID) | ORAL | Status: DC | PRN

## 2012-01-02 MED ORDER — AMOXICILLIN-POT CLAVULANATE 875-125 MG PO TABS: 1.0000 | ORAL_TABLET | Freq: Two times a day (BID) | ORAL | Status: AC

## 2012-01-02 MED ORDER — AMOXICILLIN-POT CLAVULANATE 875-125 MG PO TABS
1.0000 | ORAL_TABLET | Freq: Two times a day (BID) | ORAL | Status: DC
  Administered 2012-01-02: 1 via ORAL
  Filled 2012-01-02 (×2): qty 1

## 2012-01-02 MED FILL — Lamotrigine Tab 100 MG: ORAL | Qty: 1 | Status: AC

## 2012-01-02 MED FILL — Piperacillin Sod-Tazobactam Sod in Dex IV Sol 3-0.375GM/50ML: INTRAVENOUS | Qty: 50 | Status: AC

## 2012-01-02 MED FILL — Heparin Sodium (Porcine) Inj 5000 Unit/ML: INTRAMUSCULAR | Qty: 1 | Status: AC

## 2012-01-02 MED FILL — Saccharomyces boulardii Cap 250 MG: ORAL | Qty: 1 | Status: AC

## 2012-01-02 MED FILL — Divalproex Sodium Tab ER 24 HR 500 MG: ORAL | Qty: 2 | Status: AC

## 2012-01-02 NOTE — Discharge Summary (Signed)
Physician Discharge Summary  Patient ID: Harry Carroll MRN: [id number] DOB/AGE: [birth year] 51 y.o.  Admit date: 12/29/2011 Discharge date: 01/02/2012  Admission Diagnoses: Abdominal pain, s/p Partial Colectomy 12/20/10 Dr.Gerkin for diverticulitis. Large amount of free air on admit CT, post op ileus.  Sleep Apnea,  Restless leg  Bipolar/Depression    Discharge Diagnoses: SAME Principal Problem:  *Pneumoperitoneum, most likely postoperative Active Problems:  BIPOLAR DISORDER UNSPECIFIED  DEPRESSION  INSOMNIA  LIVER FUNCTION TESTS, ABNORMAL, HX OF  Personal history of colonic polyps  OSA (obstructive sleep apnea)  Diverticulitis-post resection July 2013  Ileus following gastrointestinal surgery   PROCEDURES: NONE  Hospital Course: Harry Carroll is an 51 y.o. male who underwent elective sigmoid colectomy about 12 days ago for symptomatic diverticulitis. Over the last several days he has been having more abdominal discomfort. When he presented to the office today for staple removal he was found to be tender in the abdomen and more distended. Plain films show more free air than at discharge and he was referred for readmision and management.  CT ABDOMEN AND PELVIS WITH CONTRAST :  1. Large amount of free intraperitoneal air, which is greater than expected 9 days postoperatively. This also appears to be increased since previous postoperative radiographs on 12/24/2011. Anastomotic leak or recurrent bowel perforation cannot be excluded. 2. Small amount of free fluid in the pelvic cul-de-sac. No definite abscess identified. 3. Postoperative ileus. 4. Chronic bladder wall thickening, likely due to chronic bladder outlet obstruction or chronic cystitis.  He was admitted and placed on antibiotics, he had no peritonitis, and he quickly improved.  Today he's eating a regular diet, without pain, 3 BM's recorded yesterday.  He feels much better.  We plan to send home later today, with plans for follow up  end of this week.  DR. Gerrit Friends is not available next week.  Condition on D/C:  Improving  Disposition: 01-Home or Self Care  Discharge Orders    Future Appointments: Provider: Department: Dept Phone: Center:   01/05/2012 2:00 PM Velora Heckler, MD [facility] [phone] None   01/18/2012 12:15 PM Velora Heckler, MD [facility] [phone] None   03/02/2012 8:30 AM Wanda Plump, MD [facility] [phone] [facility]     Medication List  As of 01/02/2012  9:09 AM   TAKE these medications         acetaminophen 325 MG tablet   Commonly known as: TYLENOL   Take 2 tablets (650 mg total) by mouth every 6 (six) hours as needed (or Temp > 100).      AMBIEN 10 MG tablet   Generic drug: zolpidem   Take 5 mg by mouth at bedtime as needed. For sleep      amoxicillin-clavulanate 875-125 MG per tablet   Commonly known as: AUGMENTIN   Take 1 tablet by mouth every 12 (twelve) hours.      atorvastatin 40 MG tablet   Commonly known as: LIPITOR   Take 0.5 tablets (20 mg total) by mouth at bedtime.      divalproex 500 MG 24 hr tablet   Commonly known as: DEPAKOTE ER   Take 1,000 mg by mouth every evening.      HYDROcodone-acetaminophen 5-325 MG per tablet   Commonly known as: NORCO/VICODIN   Take 1-2 tablets by mouth every 4 (four) hours as needed.      lamoTRIgine 100 MG tablet   Commonly known as: LAMICTAL   Take 100 mg by mouth every evening.  saccharomyces boulardii 250 MG capsule   Commonly known as: FLORASTOR   Take 1 capsule (250 mg total) by mouth 2 (two) times daily.           Follow-up Information    Follow up with Velora Heckler, MD on 01/05/2012. (Return for follow up in his office at 1:45 PM.)    Contact information:   [facility] [address], [address] [place] [address] [phone]          Signed: Sherrie George 01/02/2012, 9:09 AM

## 2012-01-02 NOTE — Progress Notes (Signed)
  Subjective: Feeling much better, tolerating solid food, +BM, no pain currently.  Objective: Vital signs in last 24 hours: Temp:  [98.6 F (37 C)-98.8 F (37.1 C)] 98.6 F (37 C) (07/22 0500) Pulse Rate:  [69-84] 69  (07/22 0500) Resp:  [16-18] 18  (07/22 0500) BP: (111-144)/(71-81) 111/71 mmHg (07/22 0500) SpO2:  [96 %] 96 % (07/22 0500) Last BM Date: 01/01/12  480 PO recorded, 3 stools recorded yesterday, Diet: bland, afebrile, VSS, labs OK   Intake/Output from previous day: 07/21 0701 - 07/22 0700 In: 808.8 [P.O.:480; I.V.:228.8; IV Piggyback:100] Out: 450 [Urine:450] Intake/Output this shift: Total I/O In: -  Out: 200 [Urine:200]  General appearance: alert, cooperative and no distress Resp: clear to auscultation bilaterally GI: soft, non-tender; bowel sounds normal; no masses,  no organomegaly and incision well healed steri strips still in place.  Lab Results:   [facility] 01/02/12 0357  WBC 7.6  HGB 13.3  HCT 38.6*  PLT 304    BMET  Basename 01/02/12 0357  NA 137  K 4.0  CL 100  CO2 25  GLUCOSE 104*  BUN 8  CREATININE 1.04  CALCIUM 9.3   PT/INR No results found for this basename: LABPROT:2,INR:2 in the last 72 hours   Lab 12/30/11 0400 12/29/11 1250  AST 12 14  ALT 20 30  ALKPHOS 41 47  BILITOT 0.8 1.0  PROT 6.1 6.7  ALBUMIN 2.8* 3.3*     Lipase     Component Value Date/Time   LIPASE 29 11/15/2011 1225     Studies/Results: No results found.  Medications:    . divalproex  1,000 mg Oral QHS  . heparin  5,000 Units Subcutaneous Q8H  . lamoTRIgine  100 mg Oral QHS  . piperacillin-tazobactam (ZOSYN)  IV  3.375 g Intravenous Q8H  . saccharomyces boulardii  250 mg Oral BID    Assessment/Plan Abdominal pain, s/p Partial Colectomy 12/20/10 Dr.Gerkin for diverticulitis. Large amount of free air on admit CT, post op ileus. Sleep Apnea,  Restless leg Bipolar/Depression   Plan:  We let DR. Andrey Campanile see him and may go home later today.  7  days of Augmentin.  Follow up 1 week Dr. Gerrit Friends.    LOS: 4 days    Harry Carroll 01/02/2012

## 2012-01-02 NOTE — Discharge Summary (Signed)
Koltan Portocarrero M. Asya Derryberry, MD, FACS General, Bariatric, & Minimally Invasive Surgery [facility]  

## 2012-01-02 NOTE — Progress Notes (Signed)
No n/v. +bms. Tolerating diet. No fever. No wbc. No tachy. Min soreness in lower abdomen  Alert, nad Obese, soft, nt, nd, incision c/d/i  Ok with d/c home today  1 week of oral abx F/u with Dr Gerrit Friends this week Discussed discharge instructions with pt  Mary Sella. Andrey Campanile, MD, FACS General, Bariatric, & Minimally Invasive Surgery [facility] [place]

## 2012-01-04 ENCOUNTER — Encounter (INDEPENDENT_AMBULATORY_CARE_PROVIDER_SITE_OTHER): Payer: 59 | Admitting: Surgery

## 2012-01-05 ENCOUNTER — Ambulatory Visit (INDEPENDENT_AMBULATORY_CARE_PROVIDER_SITE_OTHER): Payer: 59 | Admitting: Surgery

## 2012-01-05 ENCOUNTER — Encounter (INDEPENDENT_AMBULATORY_CARE_PROVIDER_SITE_OTHER): Payer: Self-pay | Admitting: Surgery

## 2012-01-05 VITALS — BP 110/76 | HR 78 | Temp 97.9°F | Resp 18 | Ht 69.0 in | Wt 234.4 lb

## 2012-01-05 DIAGNOSIS — K5732 Diverticulitis of large intestine without perforation or abscess without bleeding: Secondary | ICD-10-CM

## 2012-01-05 DIAGNOSIS — K5792 Diverticulitis of intestine, part unspecified, without perforation or abscess without bleeding: Secondary | ICD-10-CM

## 2012-01-05 NOTE — Patient Instructions (Signed)
  COCOA BUTTER & VITAMIN E CREAM  ([employer] or other brand)  Apply cocoa butter/vitamin E cream to your incision 2 - 3 times daily.  Massage cream into incision for one minute with each application.  Use sunscreen (50 SPF or higher) for first 6 months after surgery if area is exposed to sun.  You may substitute Mederma or other scar reducing creams as desired.   

## 2012-01-05 NOTE — Progress Notes (Signed)
General Surgery [facility]  Visit Diagnoses: 1. Diverticulitis-post resection July 2013     HISTORY: The patient returns for postoperative visit having undergone sigmoid colectomy for complicated diverticular disease. Patient was readmitted for abdominal pain, distention, postoperative ileus. Concern was raised over intra-abdominal free air. This was seen on plain x-rays. Patient underwent CT scan of the abdomen and pelvis which did not show perforation. Patient was observed for 3 days in the hospital and resolved nicely with intravenous fluids and intravenous antibiotics. He has now been home for 4 days. He is tolerating a regular diet. He is having no further complications.  EXAM: Abdomen is soft nontender without distention. Wound is healing without complication. A few Steri-Strips remain in place. Active bowel sounds are present.  IMPRESSION: Status post sigmoid colectomy for diverticular disease  PLAN: Patient will begin applying topical creams to his incision. He is restricted to no heavy lifting. He will return to work on August 6. He will return for a final wound check in 4 weeks.  Velora Heckler, MD, FACS General & Endocrine Surgery [facility]

## 2012-01-18 ENCOUNTER — Encounter (INDEPENDENT_AMBULATORY_CARE_PROVIDER_SITE_OTHER): Payer: 59 | Admitting: Surgery

## 2012-02-08 ENCOUNTER — Encounter (INDEPENDENT_AMBULATORY_CARE_PROVIDER_SITE_OTHER): Payer: Self-pay | Admitting: Surgery

## 2012-02-08 ENCOUNTER — Ambulatory Visit (INDEPENDENT_AMBULATORY_CARE_PROVIDER_SITE_OTHER): Payer: 59 | Admitting: Surgery

## 2012-02-08 VITALS — BP 122/86 | HR 68 | Temp 97.8°F | Resp 16 | Ht 69.0 in | Wt 243.6 lb

## 2012-02-08 DIAGNOSIS — K5792 Diverticulitis of intestine, part unspecified, without perforation or abscess without bleeding: Secondary | ICD-10-CM

## 2012-02-08 DIAGNOSIS — K5732 Diverticulitis of large intestine without perforation or abscess without bleeding: Secondary | ICD-10-CM

## 2012-02-08 NOTE — Patient Instructions (Signed)
  COCOA BUTTER & VITAMIN E CREAM  ([employer] or other brand)  Apply cocoa butter/vitamin E cream to your incision 2 - 3 times daily.  Massage cream into incision for one minute with each application.  Use sunscreen (50 SPF or higher) for first 6 months after surgery if area is exposed to sun.  You may substitute Mederma or other scar reducing creams as desired.   

## 2012-02-08 NOTE — Progress Notes (Signed)
General Surgery [facility]  Visit Diagnoses: 1. Diverticulitis-post resection July 2013     HISTORY: Patient returns for postoperative visit having undergone sigmoid colectomy for diverticular disease. Final pathology showed focal acute diverticulitis and diverticulosis. No sign of malignancy was identified.  Patient notes that he is feeling better than he has in years. He is eating a regular diet. He is having 2 formed bowel movements daily. He denies abdominal pain.  EXAM: Abdomen is soft nontender without distention. Midline surgical incision is healing nicely. No sign of infection. No sign of herniation.  IMPRESSION: Status post sigmoid colectomy for diverticular disease  PLAN: Patient is released to full activity without restriction. He will followup with his primary care physician later this fall. He is due for a colonoscopy and has a history of colon polyps. I've asked him to wait at least 6 months before undergoing colonoscopy. He we'll make arrangements for that procedure.  Patient will return for followup as needed.  Velora Heckler, MD, FACS General & Endocrine Surgery [facility]

## 2012-03-02 ENCOUNTER — Encounter: Payer: Self-pay | Admitting: Internal Medicine

## 2012-03-02 ENCOUNTER — Ambulatory Visit (INDEPENDENT_AMBULATORY_CARE_PROVIDER_SITE_OTHER): Payer: 59 | Admitting: Internal Medicine

## 2012-03-02 VITALS — BP 122/80 | HR 81 | Temp 98.2°F | Ht 69.5 in | Wt 248.0 lb

## 2012-03-02 DIAGNOSIS — G4733 Obstructive sleep apnea (adult) (pediatric): Secondary | ICD-10-CM

## 2012-03-02 DIAGNOSIS — Z0000 Encounter for general adult medical examination without abnormal findings: Secondary | ICD-10-CM

## 2012-03-02 DIAGNOSIS — E785 Hyperlipidemia, unspecified: Secondary | ICD-10-CM

## 2012-03-02 LAB — HEPATIC FUNCTION PANEL
ALT: 50 U/L (ref 0–53)
AST: 39 U/L — ABNORMAL HIGH (ref 0–37)
Alkaline Phosphatase: 43 U/L (ref 39–117)
Total Bilirubin: 0.8 mg/dL (ref 0.3–1.2)

## 2012-03-02 LAB — CBC WITH DIFFERENTIAL/PLATELET
Basophils Absolute: 0 10*3/uL (ref 0.0–0.1)
Eosinophils Relative: 2.7 % (ref 0.0–5.0)
HCT: 44.2 % (ref 39.0–52.0)
Hemoglobin: 15 g/dL (ref 13.0–17.0)
Lymphocytes Relative: 35.4 % (ref 12.0–46.0)
Lymphs Abs: 1.8 10*3/uL (ref 0.7–4.0)
Monocytes Relative: 6.9 % (ref 3.0–12.0)
Platelets: 197 10*3/uL (ref 150.0–400.0)
WBC: 5.1 10*3/uL (ref 4.5–10.5)

## 2012-03-02 LAB — PSA: PSA: 0.68 ng/mL (ref 0.10–4.00)

## 2012-03-02 LAB — TSH: TSH: 2.17 u[IU]/mL (ref 0.35–5.50)

## 2012-03-02 NOTE — Assessment & Plan Note (Signed)
Had surgery 12/2011, was not taking Lipitor, is back on it. Recheck a FLP in 6 months

## 2012-03-02 NOTE — Assessment & Plan Note (Signed)
Saw Dr Shelle Iron , pt decided to work on lifestyle before he goes to a CPAP. He lost weight after recent abdominal surgery and he actually noticed a significant decrease in snoring. Plan: Recommend to continue working on his lifestyle. Reassess periodically this issue

## 2012-03-02 NOTE — Assessment & Plan Note (Addendum)
Td 2001 and 2011 Declined flu shot, will get at work Cscope 12-08, tics and polyps Diet-exercise discussed Labs reviewed, will check LFTs, PSA , TSH Also c/o dizzines, likely peripheral recommend good hydration and will check a CBC

## 2012-03-02 NOTE — Progress Notes (Signed)
  Subjective:    Patient ID: Harry Carroll, male    DOB: 1960/09/05, 51 y.o.   MRN: [id number]  HPI Complete physical exam Since the last time he was here he was admitted twice for  diverticulitis, had surgery. Then developed pneumoperitoneum which was treated conservatively. Currently doing well.  Past Medical History: Hyperlipidemia Depression- Bipolar (Dr. Tomasa Rand) insomnia (Dr. Shelle Iron) heart murmur arthritis (rt knee) (dr zeminski) elevated liver enzymes Carpal Tunnel (dr sypher) CP 01-2009---Nl ECHO and stress test neg    Diverticulitis Bladder obstruction 12-2011   Past Surgical History: history of maxillofacial surgery history of right shoulder surgery twice due to a football injury (open surgery) right knee arthroscopy in 2007 Sigmoid colectomy for diverticulitis 12/2011, post op pneumoperoitoneum, Rx conservatively   Family History: prostate Ca - PGF Colon Ca - no DM - no HTN - no MI - no F - ?SVT s/p ablation  Social History: Married, 4 children Tobacco-- chew, does not smoke Alcohol use-yes, 1-2 beers a day Occupation: works in [employer] (in [employer]) Exercise--  not exercising   diet-- regular      Review of Systems  Respiratory: Negative for cough and shortness of breath.   Cardiovascular: Negative for chest pain and leg swelling.  Genitourinary: Negative for dysuria, hematuria and difficulty urinating.  Psychiatric/Behavioral:       Sx well controlled    1 month h/o dizziness described as a spinning, usually triggered by head motion mostly when he rolls to the right side. Denies nausea, headaches, diplopia.     Objective:   Physical Exam General -- alert, well-developed  Neck --no thyromegaly Lungs -- normal respiratory effort, no intercostal retractions, no accessory muscle use, and normal breath sounds.   Heart-- normal rate, regular rhythm, no murmur, and no gallop.   Abdomen--soft, non-tender, no distention, no masses, no HSM, no guarding, and no  rigidity.   Extremities-- no pretibial edema bilaterally Rectal-- deferred  Neurologic-- alert & oriented X3, speech and normal. Face symmetric, EOMI, strength normal in all extremities. Psych-- Cognition and judgment appear intact. Alert and cooperative with normal attention span and concentration.  not anxious appearing and not depressed appearing.      Assessment & Plan:

## 2012-03-05 ENCOUNTER — Encounter: Payer: Self-pay | Admitting: *Deleted

## 2012-03-28 ENCOUNTER — Telehealth: Payer: Self-pay | Admitting: Internal Medicine

## 2012-03-28 NOTE — Telephone Encounter (Signed)
If symptoms severe, headache, double vision, facial numbness ---> needs to go to the ER .  otherwise schedule a visit  for tomorrow afternoon to see me

## 2012-03-28 NOTE — Telephone Encounter (Signed)
Discussed with pt. He states he is not having any severe symptoms. Pt scheduled appt for tomorrow @ 345p.

## 2012-03-28 NOTE — Telephone Encounter (Signed)
Caller: [facility]x/Patient; Patient Name: Harry Carroll; PCP: Willow Ora; Best Callback Phone Number: [phone]  states had physical around 03-14-12 and he was having some issues with some dizziness which he discussed with Dr Drue Novel  03-28-12  He said it is becoming worse.  It is now occuring even when sitting still not just positional and he has noticed his balance off  No chest pain or breathing difficulty   All emergent sxs per Dizziness or Vertigo protocols ruled out except for having sensations of turning or spinning that affects balance and not responding to 4 hours of home care    Note to office to schedule appt.     He needs to be seen in 4 hours

## 2012-03-29 ENCOUNTER — Ambulatory Visit (INDEPENDENT_AMBULATORY_CARE_PROVIDER_SITE_OTHER): Payer: 59 | Admitting: Internal Medicine

## 2012-03-29 VITALS — BP 112/74 | HR 84 | Temp 98.3°F | Wt 249.0 lb

## 2012-03-29 DIAGNOSIS — R42 Dizziness and giddiness: Secondary | ICD-10-CM | POA: Insufficient documentation

## 2012-03-29 DIAGNOSIS — F3181 Bipolar II disorder: Secondary | ICD-10-CM

## 2012-03-29 DIAGNOSIS — E785 Hyperlipidemia, unspecified: Secondary | ICD-10-CM

## 2012-03-29 DIAGNOSIS — F3189 Other bipolar disorder: Secondary | ICD-10-CM

## 2012-03-29 NOTE — Patient Instructions (Addendum)
Hold Lipitor for a month then go back to it. See how that affects her memory. We are referring you to neurology Come back in 2 months

## 2012-03-29 NOTE — Assessment & Plan Note (Signed)
Persistent dizziness for several months, description fits a peripheral issue. He also complained of short-term memory issues he thinks may be related to Lipitor Plan: Neurology referral Hold Lipitor for a month, see how he does. Check valproic acid levels to rule out intoxication.

## 2012-03-29 NOTE — Assessment & Plan Note (Signed)
Will hold Lipitor for a month and then restart, the patient thinks that it may be causing fatigue and short-term memory disturbances

## 2012-03-29 NOTE — Progress Notes (Signed)
  Subjective:    Patient ID: Harry Carroll, male    DOB: 26-Apr-1961, 50 y.o.   MRN: [id number]  HPI Main concern today is dizziness This is going on since approximately July, on and off, episodes are now almost daily, may last hours. Most of the time they are clearly related to head position like for instance turning in bed or after he bend down to work on his car. Associated with nausea and a headache only yesterday. He is also concerned about taking Lipitor, he has noted himself to be quite fatigue and thinks that his short-term memory is decreased.  Past Medical History: Hyperlipidemia Depression- Bipolar (Dr. Tomasa Rand) insomnia (Dr. Shelle Iron) heart murmur arthritis (rt knee) (dr zeminski) elevated liver enzymes Carpal Tunnel (dr sypher) CP 01-2009---Nl ECHO and stress test neg    Diverticulitis Bladder obstruction 12-2011   Past Surgical History: history of maxillofacial surgery history of right shoulder surgery twice due to a football injury (open surgery) right knee arthroscopy in 2007 Sigmoid colectomy for diverticulitis 12/2011, post op pneumoperoitoneum, Rx conservatively   Family History: prostate Ca - PGF Colon Ca - no DM - no HTN - no MI - no F - ?SVT s/p ablation  Social History: Married, 4 children Tobacco-- chew, does not smoke Alcohol use-yes, 1-2 beers a day Occupation: works in [employer] (in [place])    Review of Systems He thinks that anxiety and depression are well-controlled with current medications. He has a bipolar disorder. Denies any running nose, sore throat, fever or chills.     Objective:   Physical Exam  General -- alert, well-developed, and overweight appearing. No apparent distress.  HEENT -- TMs normal, throat w/o redness, face symmetric and not tender to palpation, nose not congested   Extremities-- no pretibial edema bilaterally  Neurologic-- alert & oriented X3, EOMI, pupils equal and reactive, DTRs  and strength normal in all  extremities. Speech and gait are normal. Psych-- Cognition and judgment appear intact. Alert and cooperative with normal attention span and concentration.  not anxious appearing and not depressed appearing.      Assessment & Plan:

## 2012-03-30 ENCOUNTER — Encounter: Payer: Self-pay | Admitting: Internal Medicine

## 2012-03-30 LAB — VALPROIC ACID LEVEL: Valproic Acid Lvl: 62.4 ug/mL (ref 50.0–100.0)

## 2012-04-02 ENCOUNTER — Encounter: Payer: Self-pay | Admitting: *Deleted

## 2012-05-09 ENCOUNTER — Encounter: Payer: Self-pay | Admitting: Gastroenterology

## 2012-08-31 ENCOUNTER — Ambulatory Visit (INDEPENDENT_AMBULATORY_CARE_PROVIDER_SITE_OTHER): Payer: 59 | Admitting: Internal Medicine

## 2012-08-31 VITALS — BP 120/82 | HR 82 | Temp 98.1°F | Wt 246.0 lb

## 2012-08-31 DIAGNOSIS — E781 Pure hyperglyceridemia: Secondary | ICD-10-CM

## 2012-08-31 DIAGNOSIS — E785 Hyperlipidemia, unspecified: Secondary | ICD-10-CM

## 2012-08-31 DIAGNOSIS — R42 Dizziness and giddiness: Secondary | ICD-10-CM

## 2012-08-31 LAB — LIPID PANEL
HDL: 45.1 mg/dL (ref >39.00)
Total CHOL/HDL Ratio: 5
VLDL: 14.8 mg/dL (ref 0.0–40.0)

## 2012-08-31 LAB — LDL CHOLESTEROL, DIRECT: Direct LDL: 172.9 mg/dL

## 2012-08-31 NOTE — Assessment & Plan Note (Signed)
Resolved after he stopped Lipitor.

## 2012-08-31 NOTE — Progress Notes (Signed)
  Subjective:    Patient ID: Harry Carroll, male    DOB: 09/02/1960, 52 y.o.   MRN: [id number]  HPI Followup Since the last time he was here, he saw neurology, reports he had an MRI, eventually a become very clear that the dizziness and problems with memory were related to Lipitor. He discontinue Lipitor and is feeling very well.   Past Medical History: Hyperlipidemia Depression- Bipolar (Dr. Tomasa Rand) insomnia (Dr. Shelle Iron) heart murmur arthritis (rt knee) (dr zeminski) elevated liver enzymes Carpal Tunnel (dr sypher) CP 01-2009---Nl ECHO and stress test neg    Diverticulitis Bladder obstruction 12-2011   Past Surgical History: history of maxillofacial surgery history of right shoulder surgery twice due to a football injury (open surgery) right knee arthroscopy in 2007 Sigmoid colectomy for diverticulitis 12/2011, post op pneumoperoitoneum, Rx conservatively   Family History: prostate Ca - PGF Colon Ca - no DM - no HTN - no MI - no F - ?SVT s/p ablation  Social History: Married, 4 children Tobacco-- chew, does not smoke Alcohol use-yes, 1-2 beers a day Occupation: works in [employer] (in [place])   Review of Systems Has changed his lifestyle significantly, per his scales has lost about 13 pounds. Eating healthier, exercise frequently during the week. No chest pain, shortness of breath. No edema. History of a sleep apnea, since he lost weight, he feels slightly more energetic    Objective:   Physical Exam BP 120/82  Pulse 82  Temp(Src) 98.1 F (36.7 C) (Oral)  Wt 246 lb (111.585 kg)  BMI 35.82 kg/m2  SpO2 97%  General -- alert, well-developed Lungs -- normal respiratory effort, no intercostal retractions, no accessory muscle use, and normal breath sounds.   Heart-- normal rate, regular rhythm, no murmur, and no gallop.   Extremities-- no pretibial edema bilaterally  Neurologic-- alert & oriented X3 and strength normal in all extremities. Psych-- Cognition and  judgment appear intact. Alert and cooperative with normal attention span and concentration.  not anxious appearing and not depressed appearing.      Assessment & Plan:

## 2012-08-31 NOTE — Assessment & Plan Note (Addendum)
Since the last time he was here, it becomes clear that Lipitor was causing dizziness and problems with memory. He is off Lipitor, now asymptomatic. Doing great Job with lifestyle. Plan: Check FLP, if not at goal, we will reassess in few months at the time of his physical since he is improving his life style .

## 2012-09-01 ENCOUNTER — Encounter: Payer: Self-pay | Admitting: Internal Medicine

## 2012-09-04 ENCOUNTER — Encounter: Payer: Self-pay | Admitting: *Deleted

## 2013-01-08 ENCOUNTER — Encounter: Payer: Self-pay | Admitting: Gastroenterology

## 2013-02-14 ENCOUNTER — Emergency Department (HOSPITAL_COMMUNITY): Payer: 59

## 2013-02-14 ENCOUNTER — Encounter (HOSPITAL_COMMUNITY): Payer: Self-pay

## 2013-02-14 ENCOUNTER — Emergency Department (HOSPITAL_COMMUNITY)
Admission: EM | Admit: 2013-02-14 | Discharge: 2013-02-14 | Disposition: A | Discharge status: Home / Self Care | Payer: 59 | Attending: Emergency Medicine | Admitting: Emergency Medicine

## 2013-02-14 ENCOUNTER — Telehealth: Payer: Self-pay | Admitting: Gastroenterology

## 2013-02-14 DIAGNOSIS — Z8739 Personal history of other diseases of the musculoskeletal system and connective tissue: Secondary | ICD-10-CM | POA: Insufficient documentation

## 2013-02-14 DIAGNOSIS — G2581 Restless legs syndrome: Secondary | ICD-10-CM | POA: Insufficient documentation

## 2013-02-14 DIAGNOSIS — F3130 Bipolar disorder, current episode depressed, mild or moderate severity, unspecified: Secondary | ICD-10-CM | POA: Insufficient documentation

## 2013-02-14 DIAGNOSIS — R109 Unspecified abdominal pain: Secondary | ICD-10-CM

## 2013-02-14 DIAGNOSIS — Z8639 Personal history of other endocrine, nutritional and metabolic disease: Secondary | ICD-10-CM | POA: Insufficient documentation

## 2013-02-14 DIAGNOSIS — R110 Nausea: Secondary | ICD-10-CM | POA: Insufficient documentation

## 2013-02-14 DIAGNOSIS — R197 Diarrhea, unspecified: Secondary | ICD-10-CM | POA: Insufficient documentation

## 2013-02-14 DIAGNOSIS — G4700 Insomnia, unspecified: Secondary | ICD-10-CM | POA: Insufficient documentation

## 2013-02-14 DIAGNOSIS — G4730 Sleep apnea, unspecified: Secondary | ICD-10-CM | POA: Insufficient documentation

## 2013-02-14 DIAGNOSIS — Z8719 Personal history of other diseases of the digestive system: Secondary | ICD-10-CM | POA: Insufficient documentation

## 2013-02-14 DIAGNOSIS — R011 Cardiac murmur, unspecified: Secondary | ICD-10-CM | POA: Insufficient documentation

## 2013-02-14 DIAGNOSIS — Z79899 Other long term (current) drug therapy: Secondary | ICD-10-CM | POA: Insufficient documentation

## 2013-02-14 DIAGNOSIS — R1032 Left lower quadrant pain: Secondary | ICD-10-CM | POA: Insufficient documentation

## 2013-02-14 DIAGNOSIS — Z862 Personal history of diseases of the blood and blood-forming organs and certain disorders involving the immune mechanism: Secondary | ICD-10-CM | POA: Insufficient documentation

## 2013-02-14 HISTORY — DX: Bipolar disorder, unspecified: F31.9

## 2013-02-14 LAB — URINALYSIS, ROUTINE W REFLEX MICROSCOPIC
Bilirubin Urine: NEGATIVE
Glucose, UA: NEGATIVE mg/dL
Hgb urine dipstick: NEGATIVE
Protein, ur: NEGATIVE mg/dL
Specific Gravity, Urine: >1.046 — ABNORMAL HIGH (ref 1.005–1.030)

## 2013-02-14 LAB — CBC WITH DIFFERENTIAL/PLATELET
Basophils Absolute: 0 10*3/uL (ref 0.0–0.1)
Eosinophils Relative: 1 % (ref 0–5)
Lymphocytes Relative: 38 % (ref 12–46)
Lymphs Abs: 2.3 10*3/uL (ref 0.7–4.0)
MCV: 88.8 fL (ref 78.0–100.0)
Neutro Abs: 3.1 10*3/uL (ref 1.7–7.7)
Neutrophils Relative %: 51 % (ref 43–77)
Platelets: 190 10*3/uL (ref 150–400)
RBC: 5.01 MIL/uL (ref 4.22–5.81)
RDW: 13 % (ref 11.5–15.5)
WBC: 6.1 10*3/uL (ref 4.0–10.5)

## 2013-02-14 LAB — COMPREHENSIVE METABOLIC PANEL
ALT: 54 U/L — ABNORMAL HIGH (ref 0–53)
AST: 41 U/L — ABNORMAL HIGH (ref 0–37)
Alkaline Phosphatase: 38 U/L — ABNORMAL LOW (ref 39–117)
CO2: 26 mEq/L (ref 19–32)
Calcium: 9.6 mg/dL (ref 8.4–10.5)
GFR calc Af Amer: 81 mL/min — ABNORMAL LOW (ref >90)
GFR calc non Af Amer: 70 mL/min — ABNORMAL LOW (ref >90)
Glucose, Bld: 79 mg/dL (ref 70–99)
Potassium: 4 mEq/L (ref 3.5–5.1)
Sodium: 136 mEq/L (ref 135–145)

## 2013-02-14 MED ORDER — IOHEXOL 300 MG/ML  SOLN
50.0000 mL | Freq: Once | INTRAMUSCULAR | Status: AC | PRN
  Administered 2013-02-14: 50 mL via ORAL

## 2013-02-14 MED ORDER — IOHEXOL 300 MG/ML  SOLN
100.0000 mL | Freq: Once | INTRAMUSCULAR | Status: AC | PRN
  Administered 2013-02-14: 100 mL via INTRAVENOUS

## 2013-02-14 NOTE — ED Notes (Signed)
Pt c/o LLQ abdominal pain and diarrhea x 4 days.  Pain score 2/10, with intermittent sharp pains 10/10.  Hx of diverticulitis and partial bowel removal last year.

## 2013-02-14 NOTE — ED Provider Notes (Signed)
Medical screening examination/treatment/procedure(s) were performed by non-physician practitioner and as supervising physician I was immediately available for consultation/collaboration.   Gavin Pound. Oletta Lamas, MD 02/14/13 2051

## 2013-02-14 NOTE — ED Provider Notes (Signed)
CSN: [id number]     Arrival date & time 02/14/13  1622 History   First MD Initiated Contact with Patient 02/14/13 1709     Chief Complaint  Patient presents with  . Abdominal Pain  . Diarrhea   (Consider location/radiation/quality/duration/timing/severity/associated sxs/prior Treatment) HPI Comments: Patient is a 52 year old male with a history of diverticulitis, colonic polyps, and hyperlipidemia as well as a surgical history of partial sigmoid colectomy in July of 2013 who presents for left lower quadrant pain onset 4 days ago. Patient states the pain has been constant and gradually worsening over the last 4 days. Pain is nonradiating. He describes the pain as an aching sensation with intermittent sharp pain sensations. Patient denies taking any medication for his pain. He denies any aggravating or alleviating factors. Patient states the pain feels similar to his past diverticulitis episodes, though he states he has not had any diverticulitis flares extensive sigmoid colectomy. Patient endorses associated nausea. He denies associated fever, chest pain, shortness of breath, emesis, diarrhea, melena or hematochezia, urinary symptoms, and numbness or tingling. Patient's last bowel movement was this morning which was normal in color and consistency.  The history is provided by the patient. No language interpreter was used.   Past Medical History  Diagnosis Date  . Colon polyps     adenomatous  . Diverticulitis   . Hyperlipemia   . Insomnia   . Arthritis   . Carpal tunnel syndrome   . Heart murmur     "EXTRA BEAT"  . Restless leg   . Sleep apnea     C-PAP NOT NEED FOR "MILD SLEEP APNEA"  . Depression     BIPOLAR  . Bipolar 1 disorder    Past Surgical History  Procedure Laterality Date  . Maxillofacial surgery    . Right shoulder surgery      X2  . Right knee arthroscopy      2007  . Shoulder surgery      LEFT  . Partial colectomy  12/20/2011    Procedure: PARTIAL COLECTOMY;   Surgeon: Velora Heckler, MD;  Location: [facility];  Service: General;  Laterality: N/A;  Sigmoid colectomy  . Cystoscopy with urethral dilatation  12/20/2011    Procedure: CYSTOSCOPY WITH URETHRAL DILATATION;  Surgeon: Velora Heckler, MD;  Location: [facility];  Service: General;;  with foley insertion  . Cystoscopy with urethral dilatation  12/20/2011    Procedure: CYSTOSCOPY WITH URETHRAL DILATATION;  Surgeon: Marcine Matar, MD;  Location: [facility];  Service: Urology;;   Family History  Problem Relation Age of Onset  . Prostate cancer Paternal Grandfather   . Cancer Paternal Grandfather     prostate  . Cancer Paternal Aunt     lymph cancer   History  Substance Use Topics  . Smoking status: Never Smoker   . Smokeless tobacco: Current User    Types: Snuff  . Alcohol Use: Yes     Comment: OCCASIONAL    Review of Systems  Constitutional: Negative for fever.  Respiratory: Negative for shortness of breath.   Cardiovascular: Negative for chest pain.  Gastrointestinal: Positive for nausea and abdominal pain. Negative for vomiting and blood in stool.  Genitourinary: Negative for dysuria, hematuria, penile swelling, scrotal swelling and testicular pain.  Neurological: Negative for weakness and numbness.  All other systems reviewed and are negative.   Allergies  Ciprofloxacin; Dilaudid; and Hydrocodone  Home Medications   Current Outpatient Rx  Name  Route  Sig  Dispense  Refill  .  divalproex (DEPAKOTE) 500 MG 24 hr tablet   Oral   Take 1,000 mg by mouth every evening.          . lamoTRIgine (LAMICTAL) 100 MG tablet   Oral   Take 100 mg by mouth every evening.          . zolpidem (AMBIEN) 10 MG tablet   Oral   Take 5 mg by mouth at bedtime as needed. For sleep          BP 128/76  Pulse 79  Temp(Src) 98.5 F (36.9 C) (Oral)  Resp 16  SpO2 92%  Physical Exam  Nursing note and vitals reviewed. Constitutional: He is oriented to person, place, and time. He appears  well-developed and well-nourished. No distress.  HENT:  Head: Normocephalic and atraumatic.  Eyes: Conjunctivae and EOM are normal. No scleral icterus.  Neck: Normal range of motion.  Cardiovascular: Normal rate, regular rhythm, normal heart sounds and intact distal pulses.   Pulmonary/Chest: Effort normal. No respiratory distress. He has no wheezes. He has no rales.  Abdominal: Soft. Bowel sounds are normal. He exhibits no distension and no mass. There is tenderness (focal TTP in LLQ). There is no rebound and no guarding. Hernia confirmed negative in the right inguinal area and confirmed negative in the left inguinal area.  No peritoneal signs or evidence of acute surgical abdomen  Genitourinary: Testes normal and penis normal. Cremasteric reflex is present. Right testis shows no mass, no swelling and no tenderness. Right testis is descended. Left testis shows no mass, no swelling and no tenderness. Left testis is descended. Circumcised. No penile erythema or penile tenderness. No discharge found.  Chaperoned GU exam without any significant findings  Musculoskeletal: Normal range of motion.  Neurological: He is alert and oriented to person, place, and time.  Skin: Skin is warm and dry. No rash noted. He is not diaphoretic. No erythema. No pallor.  Psychiatric: He has a normal mood and affect. His behavior is normal.   ED Course  Procedures (including critical care time) Labs Review Labs Reviewed  COMPREHENSIVE METABOLIC PANEL - Abnormal; Notable for the following:    AST 41 (*)    ALT 54 (*)    Alkaline Phosphatase 38 (*)    GFR calc non Af Amer 70 (*)    GFR calc Af Amer 81 (*)    All other components within normal limits  URINALYSIS, ROUTINE W REFLEX MICROSCOPIC - Abnormal; Notable for the following:    Specific Gravity, Urine >1.046 (*)    All other components within normal limits  CBC WITH DIFFERENTIAL  LIPASE, BLOOD   Imaging Review Ct Abdomen Pelvis W Contrast  02/14/2013    *RADIOLOGY REPORT*  Clinical Data: Left lower quadrant abdomen pain  CT ABDOMEN AND PELVIS WITH CONTRAST  Technique:  Multidetector CT imaging of the abdomen and pelvis was performed following the standard protocol during bolus administration of intravenous contrast.  Contrast: 50mL OMNIPAQUE IOHEXOL 300 MG/ML  SOLN, OMNIPAQUE IOHEXOL 300 MG/ML  SOLN  Comparison: December 29, 2011  Findings: The liver, spleen, pancreas, adrenal glands and kidneys are normal.  There is no hydronephrosis bilaterally.  There are small gallstones within the gallbladder unchanged.  There is atherosclerosis of the abdominal aorta without aneurysmal dilatation.  There is no abdominal lymphadenopathy.  There is no small bowel obstruction.  There is diverticulosis of colon without diverticulitis.  The appendix is normal.  Assessment of colonic wall thickness is limited due to under distention  of the colon.  Partial fluid filled bladder is normal.  There is no pelvic lymphadenopathy.  There is minor dependent atelectasis of the posterior lungs.  Degenerative joint changes of the lower lumbar spine are identified.  IMPRESSION: No acute abnormality identified in the abdomen and pelvis.  There is no evidence of diverticulitis.  Assessment of colonic wall thickness is limited due to under distention of the colon.   Original Report Authenticated By: Sherian Rein, M.D.   MDM   1. Abdominal pain in male patient    52 year old male with a history of diverticulitis status post partial colectomy who presents for left lower quadrant pain worsening over the last 4 days. Patient with focal tenderness in the left lower quadrant without peritoneal signs or evidence of acute surgical abdomen. He is well and nontoxic appearing, hemodynamically stable, and afebrile. CBC without leukocytosis or anemia. There is no electrolyte imbalance. Kidney and liver function at baseline. UA without evidence of UTI or hematuria. CT abdomen and pelvis ordered to  further evaluate symptoms. Findings without evidence of diverticulitis or acute intra-abdominal abnormalities. Patient declined pain medicine throughout ED stay; symptoms well controlled without PO medications. In light of workup and imaging findings, believe patient is appropriate for discharge with primary care and gastroenterology followup. Tylenol or ibuprofen advised for pain control. Return precautions discussed and patient agreeable to plan with no unaddressed concerns.    Antony Madura, PA-C 02/14/13 2049

## 2013-02-14 NOTE — Telephone Encounter (Signed)
Pt has history of diverticulitis with colon resection in July 2013. Pt states that he has started to have a little discomfort in the area a few days ago but this afternoon he reports episodes of debilitating pain in his side. Pt states he has no fever. Instructed pt to go to the ER to be evaluated. Doug Sou PA aware.

## 2013-02-18 ENCOUNTER — Encounter: Payer: Self-pay | Admitting: Gastroenterology

## 2013-02-18 ENCOUNTER — Ambulatory Visit (INDEPENDENT_AMBULATORY_CARE_PROVIDER_SITE_OTHER): Payer: 59 | Admitting: Gastroenterology

## 2013-02-18 VITALS — BP 110/80 | HR 60 | Ht 69.0 in | Wt 247.0 lb

## 2013-02-18 DIAGNOSIS — R1032 Left lower quadrant pain: Secondary | ICD-10-CM

## 2013-02-18 DIAGNOSIS — Z86010 Personal history of colonic polyps: Secondary | ICD-10-CM

## 2013-02-18 MED ORDER — METRONIDAZOLE 250 MG PO TABS: 250.0000 mg | ORAL_TABLET | Freq: Three times a day (TID) | ORAL | Status: DC

## 2013-02-18 NOTE — Assessment & Plan Note (Signed)
One-week history of persistent left lower quadrant pain reminiscent of diverticulitis, negative CT findings.  Despite the negative CT am suspicious that he has a low-grade diverticulitis.  Recommendations #1 begin Flagyl 250 mg 3 times a day

## 2013-02-18 NOTE — Progress Notes (Signed)
History of Present Illness:  52 year old white male status post sigmoid colectomy for acute diverticulitis in July, 2013 here because of recurrent pain.  Approximately one week ago he developed left lower quadrant pain.  Pain has become more frequent and fairly constant.  About 5 days ago he had severe, sharp pains that led him to the ER where a CT scan did not demonstrate any acute changes.  Sharp pains have subsided but he continues to have mild to moderate left lower portion discomfort.  He denies fever, diarrhea or rectal bleeding.    Review of Systems: Pertinent positive and negative review of systems were noted in the above HPI section. All other review of systems were otherwise negative.    Current Medications, Allergies, Past Medical History, Past Surgical History, Family History and Social History were reviewed in [employer] electronic medical record  Vital signs were reviewed in today's medical record. Physical Exam: General: Well developed , well nourished, no acute distress Skin: anicteric Head: Normocephalic and atraumatic Eyes:  sclerae anicteric, EOMI Ears: Normal auditory acuity Mouth: No deformity or lesions Lungs: Clear throughout to auscultation Heart: Regular rate and rhythm; no murmurs, rubs or bruits Abdomen: Soft,  and non distended. No masses, hepatosplenomegaly or hernias noted. Normal Bowel sounds.  There is minimal tenderness in the left lower quadrant Rectal:deferred Musculoskeletal: Symmetrical with no gross deformities  Pulses:  Normal pulses noted Extremities: No clubbing, cyanosis, edema or deformities noted Neurological: Alert oriented x 4, grossly nonfocal Psychological:  Alert and cooperative. Normal mood and affect

## 2013-02-18 NOTE — Assessment & Plan Note (Signed)
Plan followup colonoscopy once any vestige of diverticulitis has clinically resolved.

## 2013-02-18 NOTE — Patient Instructions (Addendum)
We are sending in a script to your pharmacy Follow up in 4 weeks

## 2013-02-20 ENCOUNTER — Encounter: Payer: Self-pay | Admitting: Gastroenterology

## 2013-02-20 ENCOUNTER — Other Ambulatory Visit: Payer: Self-pay | Admitting: Gastroenterology

## 2013-02-20 MED ORDER — HYOSCYAMINE SULFATE ER 0.375 MG PO TBCR: EXTENDED_RELEASE_TABLET | ORAL | Status: DC

## 2013-03-01 ENCOUNTER — Ambulatory Visit: Payer: 59 | Admitting: Gastroenterology

## 2013-03-25 ENCOUNTER — Encounter: Payer: Self-pay | Admitting: Gastroenterology

## 2013-03-25 ENCOUNTER — Ambulatory Visit (INDEPENDENT_AMBULATORY_CARE_PROVIDER_SITE_OTHER): Payer: 59 | Admitting: Gastroenterology

## 2013-03-25 VITALS — BP 134/68 | HR 90 | Ht 69.0 in | Wt 246.0 lb

## 2013-03-25 DIAGNOSIS — Z86010 Personal history of colonic polyps: Secondary | ICD-10-CM

## 2013-03-25 DIAGNOSIS — R1032 Left lower quadrant pain: Secondary | ICD-10-CM

## 2013-03-25 MED ORDER — NA SULFATE-K SULFATE-MG SULF 17.5-3.13-1.6 GM/177ML PO SOLN: 1.0000 | Freq: Once | ORAL | Status: DC

## 2013-03-25 NOTE — Patient Instructions (Signed)

## 2013-03-25 NOTE — Assessment & Plan Note (Signed)
Abdominal pain resolved with antibiotics.  This was presumably due to 2 diverticulitis.

## 2013-03-25 NOTE — Progress Notes (Signed)
History of Present Illness:  The patient reports resolution of lower abdominal pain for which he received a course of Flagyl for CT negative acute diverticulitis.  At this point he has occasional very transient abdominal discomfort.  Adenomatous polyps were removed in 2008.    Review of Systems: Pertinent positive and negative review of systems were noted in the above HPI section. All other review of systems were otherwise negative.    Current Medications, Allergies, Past Medical History, Past Surgical History, Family History and Social History were reviewed in [employer] electronic medical record  Vital signs were reviewed in today's medical record. Physical Exam: General: Well developed , well nourished, no acute distress

## 2013-03-25 NOTE — Assessment & Plan Note (Signed)
Plan followup colonoscopy 

## 2013-03-26 ENCOUNTER — Encounter: Payer: Self-pay | Admitting: Gastroenterology

## 2013-03-28 ENCOUNTER — Ambulatory Visit (AMBULATORY_SURGERY_CENTER): Payer: 59 | Admitting: Gastroenterology

## 2013-03-28 ENCOUNTER — Encounter: Payer: Self-pay | Admitting: Gastroenterology

## 2013-03-28 VITALS — BP 114/76 | HR 62 | Temp 98.0°F | Resp 23 | Ht 69.0 in | Wt 246.0 lb

## 2013-03-28 DIAGNOSIS — R1032 Left lower quadrant pain: Secondary | ICD-10-CM

## 2013-03-28 DIAGNOSIS — D126 Benign neoplasm of colon, unspecified: Secondary | ICD-10-CM

## 2013-03-28 DIAGNOSIS — K5730 Diverticulosis of large intestine without perforation or abscess without bleeding: Secondary | ICD-10-CM

## 2013-03-28 DIAGNOSIS — K648 Other hemorrhoids: Secondary | ICD-10-CM

## 2013-03-28 DIAGNOSIS — Z86010 Personal history of colonic polyps: Secondary | ICD-10-CM

## 2013-03-28 MED ORDER — SODIUM CHLORIDE 0.9 % IV SOLN: 500.0000 mL | INTRAVENOUS | Status: DC

## 2013-03-28 NOTE — Progress Notes (Signed)
Report to pacu rn, vss, bbs=clear 

## 2013-03-28 NOTE — Progress Notes (Signed)
Called to room to assist during endoscopic procedure.  Patient ID and intended procedure confirmed with present staff. Received instructions for my participation in the procedure from the performing physician.  

## 2013-03-28 NOTE — Op Note (Signed)
[facility] [address] [place], [address]   COLONOSCOPY PROCEDURE REPORT  PATIENT: Harry Carroll, Harry Carroll  MR#: [id number] BIRTHDATE: 06/25/1960 , 51  yrs. old GENDER: Male ENDOSCOPIST: Louis Meckel, MD REFERRED MV:HQIO Drue Novel, M.D. PROCEDURE DATE:  03/28/2013 PROCEDURE:   Colonoscopy with snare polypectomy and Colonoscopy with cold biopsy polypectomy First Screening Colonoscopy - Avg.  risk and is 50 yrs.  old or older - No.  Prior Negative Screening - Now for repeat screening. N/A  History of Adenoma - Now for follow-up colonoscopy & has been > or = to 3 yrs.  Yes hx of adenoma.  Has been 3 or more years since last colonoscopy.  Polyps Removed Today? Yes. ASA CLASS:   Class II INDICATIONS:Patient's personal history of adenomatous colon polyps. 2008 MEDICATIONS: MAC sedation, administered by CRNA and Propofol (Diprivan) 330 mg IV  DESCRIPTION OF PROCEDURE:   After the risks benefits and alternatives of the procedure were thoroughly explained, informed consent was obtained.  A digital rectal exam revealed no abnormalities of the rectum.   The LB [id number]  endoscope was introduced through the anus and advanced to the cecum, which was identified by both the appendix and ileocecal valve. No adverse events experienced.   The quality of the prep was Suprep good  The instrument was then slowly withdrawn as the colon was fully examined.      COLON FINDINGS: A sessile polyp measuring 2 mm in size was found in the ascending colon.  A polypectomy was performed with cold forceps.   A sessile polyp measuring 4 mm in size was found in the transverse colon.  A polypectomy was performed with a cold snare. The resection was complete and the polyp tissue was completely retrieved.   A sessile polyp measuring 3 mm in size was found in the proximal descending colon.  A polypectomy was performed with a cold snare.  The resection was complete and the polyp tissue  was completely retrieved.   A semi-pedunculated polyp measuring 12 mm in size was found in the distal descending colon.  A polypectomy was performed using snare cautery.  The resection was complete and the polyp tissue was completely retrieved.   Mild diverticulosis was noted in the sigmoid colon.   Internal hemorrhoids were found. Retroflexed views revealed no abnormalities. The time to cecum=2 minutes 02 seconds.  Withdrawal time=18 minutes 35 seconds.  The scope was withdrawn and the procedure completed. COMPLICATIONS: There were no complications.  ENDOSCOPIC IMPRESSION: 1.   Sessile polyp measuring 2 mm in size was found in the ascending colon; polypectomy was performed with cold forceps 2.   Sessile polyp measuring 4 mm in size was found in the transverse colon; polypectomy was performed with a cold snare 3.   Sessile polyp measuring 3 mm in size was found in the proximal descending colon; polypectomy was performed with a cold snare 4.   Semi-pedunculated polyp measuring 12 mm in size was found in the distal descending colon; polypectomy was performed using snare cautery 5.   Mild diverticulosis was noted in the sigmoid colon 6.   Internal hemorrhoids  RECOMMENDATIONS: Colonoscopy 3 years   eSigned:  Louis Meckel, MD 03/28/2013 3:26 PM   cc:   PATIENT NAME:  [name], [name] MR#: [id number]

## 2013-03-28 NOTE — Progress Notes (Signed)
Patient did not experience any of the following events: a burn prior to discharge; a fall within the facility; wrong site/side/patient/procedure/implant event; or a hospital transfer or hospital admission upon discharge from the facility. (G8907) Patient did not have preoperative order for IV antibiotic SSI prophylaxis. (G8918)  

## 2013-03-28 NOTE — Patient Instructions (Signed)
YOU HAD AN ENDOSCOPIC PROCEDURE TODAY AT THE [facility]: Refer to the procedure report that was given to you for any specific questions about what was found during the examination.  If the procedure report does not answer your questions, please call your gastroenterologist to clarify.  If you requested that your care partner not be given the details of your procedure findings, then the procedure report has been included in a sealed envelope for you to review at your convenience later.  YOU SHOULD EXPECT: Some feelings of bloating in the abdomen. Passage of more gas than usual.  Walking can help get rid of the air that was put into your GI tract during the procedure and reduce the bloating. If you had a lower endoscopy (such as a colonoscopy or flexible sigmoidoscopy) you may notice spotting of blood in your stool or on the toilet paper. If you underwent a bowel prep for your procedure, then you may not have a normal bowel movement for a few days.  DIET: Your first meal following the procedure should be a light meal and then it is ok to progress to your normal diet.  A half-sandwich or bowl of soup is an example of a good first meal.  Heavy or fried foods are harder to digest and may make you feel nauseous or bloated.  Likewise meals heavy in dairy and vegetables can cause extra gas to form and this can also increase the bloating.  Drink plenty of fluids but you should avoid alcoholic beverages for 24 hours.  ACTIVITY: Your care partner should take you home directly after the procedure.  You should plan to take it easy, moving slowly for the rest of the day.  You can resume normal activity the day after the procedure however you should NOT DRIVE or use heavy machinery for 24 hours (because of the sedation medicines used during the test).    SYMPTOMS TO REPORT IMMEDIATELY: A gastroenterologist can be reached at any hour.  During normal business hours, 8:30 AM to 5:00 PM Monday through Friday,  call [phone].  After hours and on weekends, please call the GI answering service at [phone] who will take a message and have the physician on call contact you.   Following lower endoscopy (colonoscopy or flexible sigmoidoscopy):  Excessive amounts of blood in the stool  Significant tenderness or worsening of abdominal pains  Swelling of the abdomen that is new, acute  Fever of 100F or higher    FOLLOW UP: If any biopsies were taken you will be contacted by phone or by letter within the next 1-3 weeks.  Call your gastroenterologist if you have not heard about the biopsies in 3 weeks.  Our staff will call the home number listed on your records the next business day following your procedure to check on you and address any questions or concerns that you may have at that time regarding the information given to you following your procedure. This is a courtesy call and so if there is no answer at the home number and we have not heard from you through the emergency physician on call, we will assume that you have returned to your regular daily activities without incident.  SIGNATURES/CONFIDENTIALITY: You and/or your care partner have signed paperwork which will be entered into your electronic medical record.  These signatures attest to the fact that that the information above on your After Visit Summary has been reviewed and is understood.  Full responsibility of the confidentiality   of this discharge information lies with you and/or your care-partner.    INFORMATION ON POLYPS,DIVERTICULOSIS,HIGH FIBER DIET ,AND HEMORRHOIDS GIVEN TO YOU TODAY   REPEAT COLONOSCOPY IN 3 YEARS

## 2013-03-29 ENCOUNTER — Telehealth: Payer: Self-pay | Admitting: *Deleted

## 2013-03-29 NOTE — Telephone Encounter (Signed)
No answer. Number identifier. Message left to call if any questions or concerns. 

## 2013-04-04 ENCOUNTER — Encounter: Payer: Self-pay | Admitting: Gastroenterology

## 2013-04-18 ENCOUNTER — Other Ambulatory Visit: Payer: Self-pay

## 2013-05-28 ENCOUNTER — Encounter: Payer: Self-pay | Admitting: Internal Medicine

## 2013-05-28 ENCOUNTER — Ambulatory Visit (INDEPENDENT_AMBULATORY_CARE_PROVIDER_SITE_OTHER): Payer: 59 | Admitting: Internal Medicine

## 2013-05-28 VITALS — BP 138/87 | HR 93 | Temp 98.0°F | Wt 248.0 lb

## 2013-05-28 DIAGNOSIS — G4733 Obstructive sleep apnea (adult) (pediatric): Secondary | ICD-10-CM

## 2013-05-28 DIAGNOSIS — R5383 Other fatigue: Secondary | ICD-10-CM

## 2013-05-28 DIAGNOSIS — R5381 Other malaise: Secondary | ICD-10-CM

## 2013-05-28 NOTE — Assessment & Plan Note (Signed)
See OSA ?

## 2013-05-28 NOTE — Assessment & Plan Note (Signed)
Profound fatigue, OSA Chart is reviewed, Labs ordered over the last year were ok and we found no good explanation for fatigue; I believe his main problem is untreated sleep apnea, I recommend no further eval until sleep apnea is treated. Referred back to Dr Shelle Iron for consideration of a  CPAP.

## 2013-05-28 NOTE — Progress Notes (Signed)
Pre visit review using our clinic review tool, if applicable. No additional management support is needed unless otherwise documented below in the visit note. 

## 2013-05-28 NOTE — Patient Instructions (Signed)
  Next visit for a physical exam  fasting, in 3 months  Please make an appointment

## 2013-05-28 NOTE — Progress Notes (Signed)
   Subjective:    Patient ID: Harry Carroll, male    DOB: 1960-07-14, 52 y.o.   MRN: [id number]  HPI Here to discuss the following issues: Since the last time he was here the memory issues and dizziness have come back on and off. He continues to feel extremely fatigued, he has a very stressful job, has been unable to exercise more or lose weight.  Past Medical History  Diagnosis Date  . Colon polyps     adenomatous  . Diverticulitis   . Hyperlipemia   . Insomnia   . Arthritis   . Carpal tunnel syndrome     saw Dr Onnie Graham   . Heart murmur   . Restless leg   . Sleep apnea   . Bipolar 1 disorder     Dr Jennelle Human   . Normal cardiac stress test     CP 01-2009---Nl ECHO and stress test neg   Past Surgical History  Procedure Laterality Date  . Maxillofacial surgery    . Right shoulder surgery      X2  . Right knee arthroscopy      2007  . Shoulder surgery      LEFT  . Partial colectomy  12/20/2011    Procedure: PARTIAL COLECTOMY;  Surgeon: Velora Heckler, MD;  Location: [facility];  Service: General;  Laterality: N/A;  Sigmoid colectomy  . Cystoscopy with urethral dilatation  12/20/2011    Procedure: CYSTOSCOPY WITH URETHRAL DILATATION;  Surgeon: Velora Heckler, MD;  Location: [facility];  Service: General;;  with foley insertion  . 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 Anxiety and depression under the care of psychiatry, relatively well controlled although he has some remanent depression.    Objective:   Physical Exam BP 138/87  Pulse 93   Temp(Src) 98 F (36.7 C)  Wt 248 lb (112.492 kg)  SpO2 97% General -- alert, well-developed, NAD.   Psych-- Cognition and judgment appear intact. Cooperative with normal attention span and concentration. No anxious appearing , no depressed appearing.      Assessment & Plan:     Decreased memory, Saw neurology 06-2012, sx  felt to be multifactorial. Reassess once OSA is treated

## 2013-06-11 ENCOUNTER — Encounter: Payer: Self-pay | Admitting: Internal Medicine

## 2013-06-21 ENCOUNTER — Ambulatory Visit (INDEPENDENT_AMBULATORY_CARE_PROVIDER_SITE_OTHER): Payer: 59 | Admitting: Pulmonary Disease

## 2013-06-21 ENCOUNTER — Encounter: Payer: Self-pay | Admitting: Pulmonary Disease

## 2013-06-21 VITALS — BP 128/86 | HR 95 | Temp 97.9°F | Ht 69.0 in | Wt 250.0 lb

## 2013-06-21 DIAGNOSIS — G4733 Obstructive sleep apnea (adult) (pediatric): Secondary | ICD-10-CM

## 2013-06-21 NOTE — Progress Notes (Signed)
   Subjective:    Patient ID: Harry Carroll, male    DOB: 1960/10/20, 53 y.o.   MRN: [id number]  HPI The patient comes in today for followup of his moderate obstructive sleep apnea. He had decided to try weight loss initially, but has not made a lot of progress. He continues to have very fragmented sleep with ongoing symptoms. He wishes to consider a trial of CPAP.   Review of Systems  Constitutional: Negative for fever and unexpected weight change.  HENT: Negative for congestion, dental problem, ear pain, nosebleeds, postnasal drip, rhinorrhea, sinus pressure, sneezing, sore throat and trouble swallowing.   Eyes: Negative for redness and itching.  Respiratory: Negative for cough, chest tightness, shortness of breath and wheezing.   Cardiovascular: Negative for palpitations and leg swelling.  Gastrointestinal: Negative for nausea and vomiting.  Genitourinary: Negative for dysuria.  Musculoskeletal: Negative for joint swelling.  Skin: Negative for rash.  Neurological: Negative for headaches.  Hematological: Does not bruise/bleed easily.  Psychiatric/Behavioral: Negative for dysphoric mood. The patient is not nervous/anxious.        Objective:   Physical Exam Overweight male in no acute distress Nose without purulence or discharge noted Neck without lymphadenopathy or thyromegaly Lower extremities without edema, no cyanosis Alert and oriented, moves all 4 extremities.       Assessment & Plan:

## 2013-06-21 NOTE — Patient Instructions (Signed)
Will start on cpap at a moderate pressure level.  Please call if you are having issues with tolerance. Work on weight loss followup with me in 8 weeks.  

## 2013-06-21 NOTE — Assessment & Plan Note (Signed)
The patient has moderate obstructive sleep apnea with ongoing symptoms of sleep fragmentation. He has not been very successful losing weight, I have recommended that he give CPAP a trial. He is willing to do this. I will set the patient up on cpap at a moderate pressure level to allow for desensitization, and will troubleshoot the device over the next 4-6weeks if needed.  The pt is to call me if having issues with tolerance.  Will then optimize the pressure once patient is able to wear cpap on a consistent basis.

## 2013-08-16 ENCOUNTER — Ambulatory Visit: Payer: 59 | Admitting: Pulmonary Disease

## 2013-08-26 ENCOUNTER — Telehealth: Payer: Self-pay

## 2013-08-26 NOTE — Telephone Encounter (Signed)
Left message for call back Non identifiable  Flu vaccine--04/2013 Tdap--01/2010 [facility]--03/2013--Dr Kaplan--multiple adenomatous polyps--next due 03/2014 PSA---02/2012--0.68

## 2013-08-27 ENCOUNTER — Encounter: Payer: Self-pay | Admitting: Internal Medicine

## 2013-08-27 ENCOUNTER — Ambulatory Visit (INDEPENDENT_AMBULATORY_CARE_PROVIDER_SITE_OTHER): Payer: 59 | Admitting: Internal Medicine

## 2013-08-27 ENCOUNTER — Telehealth: Payer: Self-pay | Admitting: Internal Medicine

## 2013-08-27 VITALS — BP 118/72 | HR 88 | Temp 97.9°F | Ht 69.0 in | Wt 249.0 lb

## 2013-08-27 DIAGNOSIS — Z0000 Encounter for general adult medical examination without abnormal findings: Secondary | ICD-10-CM

## 2013-08-27 DIAGNOSIS — G4733 Obstructive sleep apnea (adult) (pediatric): Secondary | ICD-10-CM

## 2013-08-27 LAB — LIPID PANEL
CHOL/HDL RATIO: 5
Cholesterol: 258 mg/dL — ABNORMAL HIGH (ref 0–200)
HDL: 50.3 mg/dL (ref >39.00)
LDL CALC: 183 mg/dL — AB (ref 0–99)
TRIGLYCERIDES: 125 mg/dL (ref 0.0–149.0)
VLDL: 25 mg/dL (ref 0.0–40.0)

## 2013-08-27 LAB — COMPREHENSIVE METABOLIC PANEL
ALBUMIN: 4.6 g/dL (ref 3.5–5.2)
ALK PHOS: 34 U/L — AB (ref 39–117)
ALT: 68 U/L — ABNORMAL HIGH (ref 0–53)
AST: 45 U/L — AB (ref 0–37)
BUN: 23 mg/dL (ref 6–23)
CO2: 23 mEq/L (ref 19–32)
Calcium: 9.9 mg/dL (ref 8.4–10.5)
Chloride: 106 mEq/L (ref 96–112)
Creatinine, Ser: 1.1 mg/dL (ref 0.4–1.5)
GFR: 77.3 mL/min (ref >60.00)
Glucose, Bld: 91 mg/dL (ref 70–99)
POTASSIUM: 4.6 meq/L (ref 3.5–5.1)
Sodium: 140 mEq/L (ref 135–145)
Total Bilirubin: 0.7 mg/dL (ref 0.3–1.2)
Total Protein: 7.4 g/dL (ref 6.0–8.3)

## 2013-08-27 LAB — PSA: PSA: 0.94 ng/mL (ref 0.10–4.00)

## 2013-08-27 NOTE — Assessment & Plan Note (Addendum)
Td   2011 Cscope 12-08, tics and polyps [facility]--03/2013--Dr Kaplan--multiple adenomatous polyps--next due 03/2014  Diet-exercise discussed  labs

## 2013-08-27 NOTE — Telephone Encounter (Signed)
Relevant patient education mailed to patient.  

## 2013-08-27 NOTE — Telephone Encounter (Signed)
Unable to reach prior to visit  

## 2013-08-27 NOTE — Patient Instructions (Signed)
Get your blood work before you leave    Next visit is for routine check up regards your cholesterol, fatigue  in 6 months  No need to come back fasting Please make an appointment      If you need more information about a healthy diet, exercise: visit  the American Heart Association, it  is a great resource online at:  [email or web address]   Mediterranean diet?

## 2013-08-27 NOTE — Assessment & Plan Note (Signed)
Saw pulmonary 06/2013, has not have time to pick up his CPAP but plans to do that soon. He continue with fatigue.

## 2013-08-27 NOTE — Progress Notes (Signed)
Subjective:    Patient ID: Harry Carroll, male    DOB: 1961/06/06, 53 y.o.   MRN: [id number]  DOS:  08/27/2013 Type of  visit:  CPX    ROS  Diet-- regular  Exercise-- very little, very busy at work  No  CP, SOB No palpitations, no lower extremity edema Denies  nausea, vomiting diarrhea No abdominal pain Denies  blood in the stools (-) cough, sputum production (-) wheezing, chest congestion No dysuria, gross hematuria, difficulty urinating  + stress at work, working long hours     Past Medical History  Diagnosis Date  . Colon polyps     adenomatous  . Diverticulitis   . Hyperlipemia   . Insomnia   . Arthritis   . Carpal tunnel syndrome     saw Dr Kristie Cowman   . Heart murmur   . Restless leg   . Sleep apnea   . Bipolar 1 disorder     Dr Clovis Pu   . Normal cardiac stress test     CP 01-2009---Nl ECHO and stress test neg    Past Surgical History  Procedure Laterality Date  . Maxillofacial surgery    . Right shoulder surgery      X2  . Right knee arthroscopy      2007  . Shoulder surgery      LEFT  . Partial colectomy  12/20/2011    Procedure: PARTIAL COLECTOMY;  Surgeon: Earnstine Regal, MD;  Location: [facility];  Service: General;  Laterality: N/A;  Sigmoid colectomy  . Cystoscopy with urethral dilatation  12/20/2011    Procedure: CYSTOSCOPY WITH URETHRAL DILATATION;  Surgeon: Earnstine Regal, MD;  Location: [facility];  Service: General;;  with foley insertion  . Cystoscopy with urethral dilatation  12/20/2011    Procedure: CYSTOSCOPY WITH URETHRAL DILATATION;  Surgeon: Franchot Gallo, MD;  Location: [facility];  Service: Urology;;    History   Social History  . Marital Status: Married    Spouse Name: N/A    Number of Children: 4  . 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 Smoking status: Never Smoker   . Smokeless tobacco: Current User    Types: Snuff  . Alcohol Use: Yes     Comment:  OCCASIONAL  . Drug Use: No  . Sexual Activity: No   Other Topics Concern  . Not on file   Social History Narrative   Lives w/ wife, youngest child 32 y/o     Family History  Problem Relation Age of Onset  . Prostate cancer Paternal Grandfather   . Cancer Paternal Aunt     lymph cancer  . Colon cancer Neg Hx   . Diabetes Neg Hx   . CAD Neg Hx        Medication List       This list is accurate as of: 08/27/13 11:59 PM.  Always use your most recent med list.               AMBIEN 10 MG tablet  Generic drug:  zolpidem  Take 5 mg by mouth at bedtime as needed. For sleep     divalproex 500 MG 24 hr tablet  Commonly known as:  DEPAKOTE ER  Take 1,000 mg by mouth every evening.     lamoTRIgine 100 MG tablet  Commonly known as:  LAMICTAL  Take 100 mg by  mouth every evening.           Objective:   Physical Exam BP 118/72  Pulse 88  Temp(Src) 97.9 F (36.6 C)  Ht 5\' 9"  (1.753 m)  Wt 249 lb (112.946 kg)  BMI 36.75 kg/m2  SpO2 96% General -- alert, well-developed, NAD.  Neck --no thyromegaly , normal carotid pulse  HEENT-- Not pale.  Lungs -- normal respiratory effort, no intercostal retractions, no accessory muscle use, and normal breath sounds.  Heart-- normal rate, regular rhythm, no murmur.  Abdomen-- Not distended, good bowel sounds,soft, non-tender. Rectal-- No external abnormalities noted. Normal sphincter tone. No rectal masses or tenderness. Brown stool   Prostate--Prostate gland firm and smooth, no enlargement, nodularity, tenderness, mass, asymmetry or induration. Extremities-- no pretibial edema bilaterally  Neurologic--  alert & oriented X3. Speech normal, gait normal, strength normal in all extremities.  Psych-- Cognition and judgment appear intact. Cooperative with normal attention span and concentration. No anxious or depressed appearing.       Assessment & Plan:

## 2013-08-27 NOTE — Progress Notes (Signed)
Pre visit review using our clinic review tool, if applicable. No additional management support is needed unless otherwise documented below in the visit note. 

## 2013-08-29 ENCOUNTER — Encounter: Payer: Self-pay | Admitting: Nurse Practitioner

## 2013-08-29 ENCOUNTER — Ambulatory Visit (INDEPENDENT_AMBULATORY_CARE_PROVIDER_SITE_OTHER): Payer: 59 | Admitting: Nurse Practitioner

## 2013-08-29 VITALS — BP 117/77 | HR 94 | Temp 98.6°F | Ht 69.0 in | Wt 246.6 lb

## 2013-08-29 DIAGNOSIS — J989 Respiratory disorder, unspecified: Secondary | ICD-10-CM

## 2013-08-29 MED ORDER — BENZONATATE 100 MG PO CAPS: ORAL_CAPSULE | ORAL | Status: DC

## 2013-08-29 NOTE — Progress Notes (Signed)
Pre visit review using our clinic review tool, if applicable. No additional management support is needed unless otherwise documented below in the visit note. 

## 2013-08-29 NOTE — Patient Instructions (Signed)
You have a virus causing your symptoms. The average duration of cold symptoms is 14 days. If this is bronchitis, you may cough for 3-4 weeks. Start daily sinus rinses (neilmed Sinus Rinse). Use 30 mg to 60 mg pseudoephedrine twice daily. Use benzocaine throat lozenges for sore throat. Take benzonatate capsules to decrease cough. Sip fluids every hour. Rest. If you are not feeling better in 1 week or develop fever or chest pain, call us for re-evaluation. Feel better!  Upper Respiratory Infection, Adult An upper respiratory infection (URI) is also sometimes known as the common cold. The upper respiratory tract includes the nose, sinuses, throat, trachea, and bronchi. Bronchi are the airways leading to the lungs. Most people improve within 1 week, but symptoms can last up to 2 weeks. A residual cough may last even longer.  CAUSES Many different viruses can infect the tissues lining the upper respiratory tract. The tissues become irritated and inflamed and often become very moist. Mucus production is also common. A cold is contagious. You can easily spread the virus to others by oral contact. This includes kissing, sharing a glass, coughing, or sneezing. Touching your mouth or nose and then touching a surface, which is then touched by another person, can also spread the virus. SYMPTOMS  Symptoms typically develop 1 to 3 days after you come in contact with a cold virus. Symptoms vary from person to person. They may include:  Runny nose.  Sneezing.  Nasal congestion.  Sinus irritation.  Sore throat.  Loss of voice (laryngitis).  Cough.  Fatigue.  Muscle aches.  Loss of appetite.  Headache.  Low-grade fever. DIAGNOSIS  You might diagnose your own cold based on familiar symptoms, since most people get a cold 2 to 3 times a year. Your caregiver can confirm this based on your exam. Most importantly, your caregiver can check that your symptoms are not due to another disease such as strep  throat, sinusitis, pneumonia, asthma, or epiglottitis. Blood tests, throat tests, and X-rays are not necessary to diagnose a common cold, but they may sometimes be helpful in excluding other more serious diseases. Your caregiver will decide if any further tests are required. RISKS AND COMPLICATIONS  You may be at risk for a more severe case of the common cold if you smoke cigarettes, have chronic heart disease (such as heart failure) or lung disease (such as asthma), or if you have a weakened immune system. The very young and very old are also at risk for more serious infections. Bacterial sinusitis, middle ear infections, and bacterial pneumonia can complicate the common cold. The common cold can worsen asthma and chronic obstructive pulmonary disease (COPD). Sometimes, these complications can require emergency medical care and may be life-threatening. PREVENTION  The best way to protect against getting a cold is to practice good hygiene. Avoid oral or hand contact with people with cold symptoms. Wash your hands often if contact occurs. There is no clear evidence that vitamin C, vitamin E, echinacea, or exercise reduces the chance of developing a cold. However, it is always recommended to get plenty of rest and practice good nutrition. TREATMENT  Treatment is directed at relieving symptoms. There is no cure. Antibiotics are not effective, because the infection is caused by a virus, not by bacteria. Treatment may include:  Increased fluid intake. Sports drinks offer valuable electrolytes, sugars, and fluids.  Breathing heated mist or steam (vaporizer or shower).  Eating chicken soup or other clear broths, and maintaining good nutrition.  Getting plenty  of rest.  Using gargles or lozenges for comfort.  Controlling fevers with ibuprofen or acetaminophen as directed by your caregiver.  Increasing usage of your inhaler if you have asthma. Zinc gel and zinc lozenges, taken in the first 24 hours of  the common cold, can shorten the duration and lessen the severity of symptoms. Pain medicines may help with fever, muscle aches, and throat pain. A variety of non-prescription medicines are available to treat congestion and runny nose. Your caregiver can make recommendations and may suggest nasal or lung inhalers for other symptoms.  HOME CARE INSTRUCTIONS   Only take over-the-counter or prescription medicines for pain, discomfort, or fever as directed by your caregiver.  Use a warm mist humidifier or inhale steam from a shower to increase air moisture. This may keep secretions moist and make it easier to breathe.  Drink enough water and fluids to keep your urine clear or pale yellow.  Rest as needed.  Return to work when your temperature has returned to normal or as your caregiver advises. You may need to stay home longer to avoid infecting others. You can also use a face mask and careful hand washing to prevent spread of the virus. SEEK MEDICAL CARE IF:   After the first few days, you feel you are getting worse rather than better.  You need your caregiver's advice about medicines to control symptoms.  You develop chills, worsening shortness of breath, or brown or red sputum. These may be signs of pneumonia.  You develop yellow or brown nasal discharge or pain in the face, especially when you bend forward. These may be signs of sinusitis.  You develop a fever, swollen neck glands, pain with swallowing, or white areas in the back of your throat. These may be signs of strep throat. SEEK IMMEDIATE MEDICAL CARE IF:   You have a fever.  You develop severe or persistent headache, ear pain, sinus pain, or chest pain.  You develop wheezing, a prolonged cough, cough up blood, or have a change in your usual mucus (if you have chronic lung disease).  You develop sore muscles or a stiff neck. Document Released: 11/23/2000 Document Revised: 08/22/2011 Document Reviewed: 10/01/2010 [facility]  Patient Information 2014 [place], [place].

## 2013-08-29 NOTE — Progress Notes (Signed)
   Subjective:    Patient ID: Harry Carroll, male    DOB: 11-29-1960, 53 y.o.   MRN: [id number]  Sore Throat  This is a new problem. The current episode started in the past 7 days. The problem has been unchanged. Neither side of throat is experiencing more pain than the other. There has been no fever. The pain is mild. Associated symptoms include congestion and coughing. Pertinent negatives include no abdominal pain, headaches, shortness of breath, swollen glands or vomiting. Exposure to: son had viral pharyngitis recently.      Review of Systems  Constitutional: Positive for fatigue. Negative for fever, activity change and appetite change.  HENT: Positive for congestion and sore throat.   Respiratory: Positive for cough. Negative for chest tightness, shortness of breath and wheezing.   Gastrointestinal: Negative for vomiting and abdominal pain.  Musculoskeletal: Negative for back pain.  Neurological: Negative for headaches.       Objective:   Physical Exam  Vitals reviewed. Constitutional: He is oriented to person, place, and time. He appears well-developed and well-nourished. No distress.  HENT:  Head: Normocephalic and atraumatic.  Right Ear: External ear normal.  Left Ear: External ear normal.  Mouth/Throat: No oropharyngeal exudate.  Uvula mildly swollen  Eyes: Conjunctivae are normal. Right eye exhibits no discharge. Left eye exhibits no discharge.  Neck: Normal range of motion. Neck supple. No thyromegaly present.  Cardiovascular: Normal rate, regular rhythm and normal heart sounds.   No murmur heard. Pulmonary/Chest: Effort normal and breath sounds normal. No respiratory distress. He has no wheezes. He has no rales.  Lymphadenopathy:    He has no cervical adenopathy.  Neurological: He is alert and oriented to person, place, and time.  Skin: Skin is warm and dry.  Psychiatric: He has a normal mood and affect. His behavior is normal. Thought content normal.         Assessment & Plan:  1. Respiratory illness Afebrile, nasal congestion, cough - benzonatate (TESSALON) 100 MG capsule; Take 1-2 capsules po up to 3 times daily PRN cough  Dispense: 60 capsule; Refill: 0 Sinus rinses. See pt instructions.

## 2013-08-30 ENCOUNTER — Telehealth: Payer: Self-pay | Admitting: Internal Medicine

## 2013-08-30 NOTE — Telephone Encounter (Signed)
Relevant patient education assigned to patient using Emmi. ° °

## 2013-09-02 ENCOUNTER — Telehealth: Payer: Self-pay | Admitting: Internal Medicine

## 2013-09-02 NOTE — Telephone Encounter (Signed)
Spoke to pt and advised that if he is still having symptoms he should schedule ov appt. Pt stated that the nausea and chest pain only lasted a few mins. However he continues to have fatigue. Advised pt to schedule ov appt and if symptoms worsen go to the ed. Pt stated that he will call [facility] [facility] office and schedule appt with Dr. Larose Kells.

## 2013-09-02 NOTE — Telephone Encounter (Signed)
Patient called and stated that since he was seen by Nicky Pugh his cold symptoms has progressed. He is feeling fatigued, nauseous, and deep chest congestion. Also patient stated that on Sunday he felt chest pains but it was a few minutes and never came back. Please advise.

## 2013-09-03 ENCOUNTER — Encounter: Payer: Self-pay | Admitting: Internal Medicine

## 2013-09-03 ENCOUNTER — Ambulatory Visit (INDEPENDENT_AMBULATORY_CARE_PROVIDER_SITE_OTHER): Payer: 59 | Admitting: Internal Medicine

## 2013-09-03 VITALS — BP 125/82 | HR 78 | Temp 97.8°F | Wt 247.0 lb

## 2013-09-03 DIAGNOSIS — J209 Acute bronchitis, unspecified: Secondary | ICD-10-CM

## 2013-09-03 DIAGNOSIS — R079 Chest pain, unspecified: Secondary | ICD-10-CM

## 2013-09-03 MED ORDER — AZITHROMYCIN 250 MG PO TABS: ORAL_TABLET | ORAL | Status: DC

## 2013-09-03 NOTE — Progress Notes (Signed)
Subjective:    Patient ID: Harry Carroll, male    DOB: 1961-03-02, 53 y.o.   MRN: [id number]  DOS:  09/03/2013 Type of  visit: Acute visit  Respiratory symptoms: He had a complete physical 08/27/2013, at that time he had some sore throat, 2 days later he came back with more respiratory symptoms, was treated symptomatically. The symptoms are getting  worse, he reports chest rattling, cough, occasional white sputum. Some difficulty breathing.  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 Then he was moving his drum set t and suddenly he felt dizzy, sweaty, had some nausea. Did not have any substernal chest pain, heartburn or abdominal pain. He is concerned about the chest pain representing heart disease. The pain has not come back. Additionally he does not have pain with activities of daily living.     ROS No fever or chills No hemoptysis No recent airplane trip over prolonged car trips. No lower extremity edema or calf pain. He reports "extreme stress at work". + Malaise fatigue. No palpitations. He also had a couple of episode of left-sided neck pain without radiation he wonders if that could be a sign of heart trouble, Pain was not worse with head motion   Past Medical History  Diagnosis Date  . Colon polyps     adenomatous  . Diverticulitis   . Hyperlipemia   . Insomnia   . Arthritis   . Carpal tunnel syndrome     saw Dr Kristie Cowman   . Heart murmur   . Restless leg   . Sleep apnea   . Bipolar 1 disorder     Dr Clovis Pu   . Normal cardiac stress test     CP 01-2009---Nl ECHO and stress test neg    Past Surgical History  Procedure Laterality Date  . Maxillofacial surgery    . Right shoulder surgery      X2  . Right knee arthroscopy      2007  . Shoulder surgery      LEFT  . Partial colectomy  12/20/2011    Procedure: PARTIAL  COLECTOMY;  Surgeon: Earnstine Regal, MD;  Location: [facility];  Service: General;  Laterality: N/A;  Sigmoid colectomy  . Cystoscopy with urethral dilatation  12/20/2011    Procedure: CYSTOSCOPY WITH URETHRAL DILATATION;  Surgeon: Earnstine Regal, MD;  Location: [facility];  Service: General;;  with foley insertion  . Cystoscopy with urethral dilatation  12/20/2011    Procedure: CYSTOSCOPY WITH URETHRAL DILATATION;  Surgeon: Franchot Gallo, MD;  Location: [facility];  Service: Urology;;    History   Social History  . Marital Status: Married    Spouse Name: N/A    Number of Children: 4  . 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 Smoking status: Never Smoker   . Smokeless tobacco: Current User    Types: Snuff  . Alcohol Use: Yes     Comment: OCCASIONAL  . Drug Use: No  . Sexual Activity: No   Other Topics Concern  . Not on file   Social History Narrative   Lives w/ wife, youngest child 51 y/o        Medication List       This list is accurate as  of: 09/03/13 11:59 PM.  Always use your most recent med list.               AMBIEN 10 MG tablet  Generic drug:  zolpidem  Take 5 mg by mouth at bedtime as needed. For sleep     azithromycin 250 MG tablet  Commonly known as:  ZITHROMAX Z-PAK  As directed     benzonatate 100 MG capsule  Commonly known as:  TESSALON  Take 1-2 capsules po up to 3 times daily PRN cough     divalproex 500 MG 24 hr tablet  Commonly known as:  DEPAKOTE ER  Take 1,000 mg by mouth every evening.     lamoTRIgine 100 MG tablet  Commonly known as:  LAMICTAL  Take 100 mg by mouth every evening.           Objective:   Physical Exam BP 125/82  Pulse 78  Temp(Src) 97.8 F (36.6 C)  Wt 247 lb (112.038 kg)  SpO2 96% General -- alert, well-developed, NAD.  HEENT-- Not pale. TMs normal, throat symmetric, no redness or discharge. Face symmetric, sinuses not tender to palpation.  Nose quite  congested. Chest wall -- no TTP Lungs -- normal respiratory effort, no intercostal retractions, no accessory muscle use, and Few rhonchi Heart-- normal rate, regular rhythm, no murmur.  Abdomen-- Not distended, good bowel sounds,soft, non-tender. Extremities-- no pretibial edema bilaterally  Neurologic--  alert & oriented X3. Speech normal, gait normal, strength normal in all extremities.  Psych-- Cognition and judgment appear intact. Cooperative with normal attention span and concentration. No anxious or depressed appearing.       Assessment & Plan:   Bronchitis-- see instructions   Atypical chest pain, neck pain. Patient is quite concerned about these symptoms, he wonders if he is related to CAD. EKG today nsr, no acute Discussed the patient ---> a normal EKG he does not rule out CAD Recommend a stress test. ER if symptoms severe

## 2013-09-03 NOTE — Progress Notes (Signed)
Pre visit review using our clinic review tool, if applicable. No additional management support is needed unless otherwise documented below in the visit note. 

## 2013-09-03 NOTE — Patient Instructions (Addendum)
Rest, fluids , tylenol For cough, take Mucinex DM twice a day as needed  For congestion use OTC Nasocort: 2 nasal sprays on each side of the nose daily until you feel better  Take the antibiotic as prescribed  (zpack) Call if no better in few days Call anytime if the symptoms are severe   Get the XR at [facility], corner of [address] (10 minutes form here); they are open 24/7 [address]  [place], [place] [address] [phone]

## 2013-09-04 DIAGNOSIS — R079 Chest pain, unspecified: Secondary | ICD-10-CM | POA: Insufficient documentation

## 2013-09-04 NOTE — Assessment & Plan Note (Signed)
Atypical chest pain, neck pain. Patient is quite concerned about these symptoms, he wonders if he is related to CAD. EKG today nsr, no acute Discussed the patient ---> a normal EKG he does not rule out CAD Recommend a stress test. ER if symptoms severe

## 2013-09-17 ENCOUNTER — Ambulatory Visit (INDEPENDENT_AMBULATORY_CARE_PROVIDER_SITE_OTHER)
Admission: RE | Admit: 2013-09-17 | Discharge: 2013-09-17 | Disposition: A | Discharge status: Home / Self Care | Payer: 59 | Source: Ambulatory Visit | Attending: Internal Medicine | Admitting: Internal Medicine

## 2013-09-17 DIAGNOSIS — R079 Chest pain, unspecified: Secondary | ICD-10-CM

## 2013-10-10 ENCOUNTER — Telehealth: Payer: Self-pay | Admitting: Internal Medicine

## 2013-10-10 ENCOUNTER — Ambulatory Visit (INDEPENDENT_AMBULATORY_CARE_PROVIDER_SITE_OTHER): Payer: 59

## 2013-10-10 DIAGNOSIS — R079 Chest pain, unspecified: Secondary | ICD-10-CM

## 2013-10-10 NOTE — Telephone Encounter (Signed)
Advise patient, normal stress test, good results. Notify me if there is any increase in symptoms.

## 2013-10-10 NOTE — Progress Notes (Signed)
Exercise Treadmill Test  Pre-Exercise Testing Evaluation Rhythm: normal sinus  Rate: 77 bpm     Test  Exercise Tolerance Test Ordering MD: Thompson Grayer, MD  Interpreting MD: Melina Copa, PA  Unique Test No: 1  Treadmill:  1  Indication for ETT: chest pain - rule out ischemia  Contraindication to ETT: No   Stress Modality: exercise - treadmill  Cardiac Imaging Performed: non   Protocol: standard Bruce - maximal  Max BP:  165/65  Max MPHR (bpm):  165 85% MPR (bpm):  140  MPHR obtained (bpm):  151 % MPHR obtained:  91%  Reached 85% MPHR (min:sec):  7:30 Total Exercise Time (min-sec):  9:00  Workload in METS: 10.1 Borg Scale: 17  Reason ETT Terminated:  desired heart rate attained, dyspnea    ST Segment Analysis At Rest: normal ST segments - no evidence of significant ST depression - poor R wave progression With Exercise: no evidence of significant ST depression  Other Information Arrhythmia:  No Angina during ETT:  absent (0) Quality of ETT:  diagnostic  ETT Interpretation:  normal - no evidence of ischemia by ST analysis  Comments: Baseline EKG NSR with poor R wave progression. Excellent exercise tolerance. No CP. Test terminated due to target HR achieved, pt's apprehension about maneuvering stage IV speed. Mild dyspnea at end of test. No CP. EKG with TWI in aVL but no significant ST segment changes.  Recommendations: Normal ETT with excellent exercise tolerance and no reported angina. F/u PCP as planned.

## 2013-10-11 NOTE — Telephone Encounter (Signed)
LMOVM

## 2013-11-25 ENCOUNTER — Ambulatory Visit (INDEPENDENT_AMBULATORY_CARE_PROVIDER_SITE_OTHER): Payer: 59 | Admitting: Internal Medicine

## 2013-11-25 ENCOUNTER — Encounter: Payer: Self-pay | Admitting: Internal Medicine

## 2013-11-25 VITALS — BP 104/74 | HR 91 | Temp 98.5°F | Wt 243.0 lb

## 2013-11-25 DIAGNOSIS — J209 Acute bronchitis, unspecified: Secondary | ICD-10-CM

## 2013-11-25 MED ORDER — CEFUROXIME AXETIL 500 MG PO TABS: 500.0000 mg | ORAL_TABLET | Freq: Two times a day (BID) | ORAL | Status: DC

## 2013-11-25 NOTE — Patient Instructions (Signed)
  Rest, fluids , tylenol For cough, take Mucinex DM twice a day as needed  For congestion use OTC Nasocort: 2 nasal sprays on each side of the nose daily until you feel better  Take the antibiotic as prescribed  (Ceftin) Call if no better in few days Call anytime if the symptoms are severe  Next visit by [phone]

## 2013-11-25 NOTE — Progress Notes (Signed)
Subjective:    Patient ID: [name], male    DOB: [birth year], 53 y.o.   MRN: [id number]  DOS:  11/25/2013 Type of  Visit: acute History: Symptoms started last week with chest congestion, cough, initially had clear sputum, now is thick-dark-green. Not taking any specific   medication. Also, was seen a few months ago w/ CP, symptoms resolve   ROS + Subjective fever, no chills. No  nausea, vomiting, diarrhea. No myalgias. No  postnasal drip but in the morning he has a lot of mucus accumulation in the throat  Past Medical History  Diagnosis Date  . Colon polyps     adenomatous  . Diverticulitis   . Hyperlipemia   . Insomnia   . Arthritis   . Carpal tunnel syndrome     saw Dr Kristie Cowman   . Heart murmur   . Restless leg   . Sleep apnea   . Bipolar 1 disorder     Dr Clovis Pu   . Normal cardiac stress test     CP 01-2009---Nl ECHO and stress test neg    Past Surgical History  Procedure Laterality Date  . Maxillofacial surgery    . Right shoulder surgery      X2  . Right knee arthroscopy      2007  . Shoulder surgery      LEFT  . Partial colectomy  12/20/2011    Procedure: PARTIAL COLECTOMY;  Surgeon: Earnstine Regal, MD;  Location: [facility];  Service: General;  Laterality: N/A;  Sigmoid colectomy  . Cystoscopy with urethral dilatation  12/20/2011    Procedure: CYSTOSCOPY WITH URETHRAL DILATATION;  Surgeon: Earnstine Regal, MD;  Location: [facility];  Service: General;;  with foley insertion  . Cystoscopy with urethral dilatation  12/20/2011    Procedure: CYSTOSCOPY WITH URETHRAL DILATATION;  Surgeon: Franchot Gallo, MD;  Location: [facility];  Service: Urology;;    History   Social History  . Marital Status: Married    Spouse Name: N/A    Number of Children: 4  . 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 Smoking status: Never Smoker   . Smokeless tobacco: Current User    Types: Snuff  . Alcohol Use:  Yes     Comment: OCCASIONAL  . Drug Use: No  . Sexual Activity: No   Other Topics Concern  . Not on file   Social History Narrative   Lives w/ wife, youngest child 49 y/o        Medication List       This list is accurate as of: 11/25/13 11:59 PM.  Always use your most recent med list.               AMBIEN 10 MG tablet  Generic drug:  zolpidem  Take 5 mg by mouth at bedtime as needed. For sleep     cefUROXime 500 MG tablet  Commonly known as:  CEFTIN  Take 1 tablet (500 mg total) by mouth 2 (two) times daily with a meal.     divalproex 500 MG 24 hr tablet  Commonly known as:  DEPAKOTE ER  Take 1,000 mg by mouth every evening.     lamoTRIgine 100 MG tablet  Commonly known as:  LAMICTAL  Take 100 mg by mouth every evening.           Objective:   Physical  Exam BP 104/74  Pulse 91  Temp(Src) 98.5 F (36.9 C) (Oral)  Wt 243 lb (110.224 kg)  SpO2 97% General -- alert, well-developed, NAD.  HEENT-- Not pale. TMs B are slt bulege-red, no d/c. Throat symmetric, mild redness , no discharge.  Face symmetric, sinuses not tender to palpation. Nose congested.  Lungs -- normal respiratory effort, no intercostal retractions, no accessory muscle use, and normal breath sounds.  Heart-- normal rate, regular rhythm, no murmur.   Extremities-- no pretibial edema bilaterally  Neurologic--  alert & oriented X3. Speech normal, gait appropriate for age, strength symmetric and appropriate for age.  Psych-- Cognition and judgment appear intact. Cooperative with normal attention span and concentration. No anxious or depressed appearing.        Assessment & Plan:   bronchitis, see instructions  Chest pain, resolved, stress test was negative

## 2013-11-25 NOTE — Progress Notes (Signed)
Pre-visit discussion using our clinic review tool. No additional management support is needed unless otherwise documented below in the visit note.  

## 2013-12-29 ENCOUNTER — Telehealth: Payer: Self-pay | Admitting: Gastroenterology

## 2013-12-29 MED ORDER — AMOXICILLIN-POT CLAVULANATE 500-125 MG PO TABS: 1.0000 | ORAL_TABLET | Freq: Two times a day (BID) | ORAL | Status: DC

## 2013-12-29 NOTE — Telephone Encounter (Signed)
C/o LLQ pain and fever since last eve, typical for pain associated with acute diverticulitis. Will start on antibiotics Pt instructed to c/b if pain worsens or no improvement next 48 hours.

## 2014-01-14 ENCOUNTER — Encounter: Payer: Self-pay | Admitting: Gastroenterology

## 2014-01-17 ENCOUNTER — Other Ambulatory Visit (INDEPENDENT_AMBULATORY_CARE_PROVIDER_SITE_OTHER): Payer: 59

## 2014-01-17 ENCOUNTER — Encounter: Payer: Self-pay | Admitting: Gastroenterology

## 2014-01-17 ENCOUNTER — Ambulatory Visit (INDEPENDENT_AMBULATORY_CARE_PROVIDER_SITE_OTHER): Payer: 59 | Admitting: Gastroenterology

## 2014-01-17 ENCOUNTER — Telehealth: Payer: Self-pay | Admitting: Gastroenterology

## 2014-01-17 VITALS — BP 124/72 | HR 78 | Temp 98.7°F | Ht 69.0 in | Wt 244.0 lb

## 2014-01-17 DIAGNOSIS — K5732 Diverticulitis of large intestine without perforation or abscess without bleeding: Secondary | ICD-10-CM

## 2014-01-17 LAB — CBC WITH DIFFERENTIAL/PLATELET
BASOS PCT: 0.2 % (ref 0.0–3.0)
Basophils Absolute: 0 10*3/uL (ref 0.0–0.1)
EOS PCT: 1 % (ref 0.0–5.0)
Eosinophils Absolute: 0.1 10*3/uL (ref 0.0–0.7)
HEMATOCRIT: 44.7 % (ref 39.0–52.0)
Hemoglobin: 15.5 g/dL (ref 13.0–17.0)
Lymphocytes Relative: 17.4 % (ref 12.0–46.0)
Lymphs Abs: 1.7 10*3/uL (ref 0.7–4.0)
MCHC: 34.7 g/dL (ref 30.0–36.0)
MCV: 91.2 fl (ref 78.0–100.0)
MONOS PCT: 6.2 % (ref 3.0–12.0)
Monocytes Absolute: 0.6 10*3/uL (ref 0.1–1.0)
Neutro Abs: 7.5 10*3/uL (ref 1.4–7.7)
Neutrophils Relative %: 75.2 % (ref 43.0–77.0)
PLATELETS: 187 10*3/uL (ref 150.0–400.0)
RBC: 4.9 Mil/uL (ref 4.22–5.81)
RDW: 13 % (ref 11.5–15.5)
WBC: 10 10*3/uL (ref 4.0–10.5)

## 2014-01-17 LAB — BASIC METABOLIC PANEL
BUN: 17 mg/dL (ref 6–23)
CALCIUM: 9.6 mg/dL (ref 8.4–10.5)
CO2: 29 mEq/L (ref 19–32)
Chloride: 101 mEq/L (ref 96–112)
Creatinine, Ser: 1.1 mg/dL (ref 0.4–1.5)
GFR: 76.36 mL/min (ref >60.00)
GLUCOSE: 90 mg/dL (ref 70–99)
POTASSIUM: 4.3 meq/L (ref 3.5–5.1)
SODIUM: 136 meq/L (ref 135–145)

## 2014-01-17 MED ORDER — TRAMADOL HCL 50 MG PO TABS: 50.0000 mg | ORAL_TABLET | Freq: Four times a day (QID) | ORAL | Status: DC | PRN

## 2014-01-17 MED ORDER — AMOXICILLIN-POT CLAVULANATE 875-125 MG PO TABS: 1.0000 | ORAL_TABLET | Freq: Two times a day (BID) | ORAL | Status: DC

## 2014-01-17 NOTE — Telephone Encounter (Signed)
Pt was given antibiotics by Dr. Deatra Ina in late July. States he is not better and he is having a lot of abdominal pain and wants to be seen. Pt scheduled to see Alonza Bogus PA today at 10am, pt aware of appt.

## 2014-01-17 NOTE — Patient Instructions (Addendum)
Please follow Clear Liquid Diet given for three days.  We have sent the following medications to your pharmacy for you to pick up at your convenience: Augmentin 875 mg, please take one tablet by mouth twice daily for fourteen days   Your physician has requested that you go to the basement for the following lab work before leaving today: CBC BMP

## 2014-01-17 NOTE — Progress Notes (Signed)
Reviewed and agree with management. Robert D. Kaplan, M.D., FACG  

## 2014-01-17 NOTE — Telephone Encounter (Signed)
Patient called at 7:10am this morning. Left message with answering service. "Having some burning, throbbing Pain near surface of skin. Wants to be seen today in the morning."

## 2014-01-17 NOTE — Progress Notes (Signed)
     01/17/2014 Harry Carroll [id number] 07/14/1960   History of Present Illness:  This is a pleasant 53 year old male who is known to Dr. Deatra Ina.  Has history of recurrent diverticulitis for which he had surgery, sigmoid colectomy, by Dr. Harlow Asa in 12/2011.  Colonoscopy 03/2013 had multiple polyps removed; one in descending colon had high grade dysplasia.  Repeat colonoscopy recommended in one year (03/2014).  Still had diverticulosis at last colonoscopy as well.  Patient started having LLQ abdominal pain in July.  On July 19 he was started on Augmentin 500 mg BID.  He had fever during that time as well.  Says that the antibiotics helped with the fever, but the pain never completely went away.  Pain has been getting worse all week and has been really bad the past couple of days.  No recurrent fever, however.   Current Medications, Allergies, Past Medical History, Past Surgical History, Family History and Social History were reviewed in [employer] record.   Physical Exam: BP 124/72  Pulse 78  Temp(Src) 98.7 F (37.1 C)  Ht 5\' 9"  (1.753 m)  Wt 244 lb (110.678 kg)  BMI 36.02 kg/m2 General: Well developed white male in no acute distress Head: Normocephalic and atraumatic Eyes:  Sclerae anicteric, conjunctiva pink  Ears: Normal auditory acuity Lungs: Clear throughout to auscultation Heart: Regular rate and rhythm Abdomen: Soft, non-distended.  Previous laparotomy scare noted.  Normal bowel sounds.  Moderate left sided TTP without R/R/G Musculoskeletal: Symmetrical with no gross deformities  Extremities: No edema  Neurological: Alert oriented x 4, grossly non-focal Psychological:  Alert and cooperative. Normal mood and affect  Assessment and Recommendations: -LLQ abdominal pain with history of recurrent diverticulitis requiring surgery in 12/2011:  Will start another course of antibiotics in the form on Augmentin 875 mg BID for 14 days.  Check CBC and BMP today.   Clear liquid diet for at least the next two days or until symptoms improving.  Will give tramadol for pain (is allergic to hydrocodone).  Told him to go to the ED is symptoms worsen over the weekend.  Need to consider repeat CT scan if symptoms do not resolve after this course of treatment.

## 2014-01-20 ENCOUNTER — Telehealth: Payer: Self-pay | Admitting: Gastroenterology

## 2014-01-21 NOTE — Telephone Encounter (Signed)
Left a message for patient to call back. 

## 2014-01-22 NOTE — Telephone Encounter (Signed)
Left a message for patient to call back. 

## 2014-01-27 NOTE — Telephone Encounter (Signed)
Left a message for patient to call back. 

## 2014-01-28 ENCOUNTER — Telehealth: Payer: Self-pay | Admitting: Gastroenterology

## 2014-01-28 NOTE — Telephone Encounter (Signed)
Please let patient know that Augmentin can be hard on the gut, causing diarrhea, etc.  Have him continue the probiotic.  How much of the antibiotic did he have left?  How is his abdominal pain?  Thank you,  Jess

## 2014-01-28 NOTE — Telephone Encounter (Signed)
Spoke with patient and he states the antibiotic "did a number on me." States he was having 6-8 stools/day with mucous and blood in them. Reports he did not take the antibiotic today. He started a probiotic yesterday. States he has had 4 stools today that have less blood, mucous. Feels better today because he did not take the antibiotic.

## 2014-01-28 NOTE — Telephone Encounter (Signed)
Patient has not returned calls x 3 days.

## 2014-01-29 NOTE — Telephone Encounter (Signed)
Left a message for patient to call back. 

## 2014-01-29 NOTE — Telephone Encounter (Signed)
Spoke with patient and he states he took 10 days of Augmentin. He does not have any pain now. He will continue probiotics.

## 2014-02-05 ENCOUNTER — Telehealth: Payer: Self-pay | Admitting: Gastroenterology

## 2014-02-05 ENCOUNTER — Encounter: Payer: Self-pay | Admitting: Gastroenterology

## 2014-02-05 NOTE — Telephone Encounter (Signed)
I have left message for the patient to call back 

## 2014-02-06 ENCOUNTER — Other Ambulatory Visit: Payer: Self-pay

## 2014-02-06 DIAGNOSIS — R197 Diarrhea, unspecified: Secondary | ICD-10-CM

## 2014-02-06 NOTE — Telephone Encounter (Signed)
Patient communicating through email. See notes.

## 2014-02-07 ENCOUNTER — Telehealth: Payer: Self-pay | Admitting: *Deleted

## 2014-02-07 ENCOUNTER — Other Ambulatory Visit: Payer: Self-pay | Admitting: *Deleted

## 2014-02-07 DIAGNOSIS — Z86010 Personal history of colonic polyps: Secondary | ICD-10-CM

## 2014-02-07 NOTE — Telephone Encounter (Signed)
CALLED PT TO INFORM THAT PROCEDURE HAD TO BE RESCHEDULED FOR [facility] BECAUSE HE NEEDS APC/ERBY  HE WAS SCHEDULED ON 10/5 FOR PREVISIT AND 10/19 FOR COLON IN [facility]  COLON NOW WILL BE AT [facility] ON 10/21 AT 12:30PM     L/M FOR PT TO RETURN MY CALL WILL NOT CANCEL [facility] APPT TILL I HEAR FROM PT

## 2014-02-10 ENCOUNTER — Other Ambulatory Visit: Payer: 59

## 2014-02-10 DIAGNOSIS — R197 Diarrhea, unspecified: Secondary | ICD-10-CM

## 2014-02-11 NOTE — Telephone Encounter (Signed)
=  View-only below this line===  ----- Message -----    From: Harry Carroll    Sent: 02/10/2014   1:46 PM      To: Oda Kilts, CMA  Pt said he did receive your message about changing his procedure date and he was calling back to confirm it would be okay =View-only below this line===  ----- Message -----    From: Harry Carroll    Sent: 02/10/2014   1:46 PM      To: Oda Kilts, CMA  Pt said he did receive your message about changing his procedure date and he was calling back to confirm it would be okay

## 2014-02-12 ENCOUNTER — Other Ambulatory Visit: Payer: Self-pay

## 2014-02-12 LAB — CLOSTRIDIUM DIFFICILE BY PCR: CDIFFPCR: DETECTED — AB

## 2014-02-12 MED ORDER — VANCOMYCIN HCL 125 MG PO CAPS: 125.0000 mg | ORAL_CAPSULE | Freq: Four times a day (QID) | ORAL | Status: AC

## 2014-02-27 ENCOUNTER — Ambulatory Visit: Payer: 59 | Admitting: Internal Medicine

## 2014-03-17 ENCOUNTER — Ambulatory Visit (AMBULATORY_SURGERY_CENTER): Payer: Self-pay

## 2014-03-17 VITALS — Ht 69.0 in | Wt 242.4 lb

## 2014-03-17 DIAGNOSIS — Z860100 Personal history of colon polyps, unspecified: Secondary | ICD-10-CM

## 2014-03-17 MED ORDER — SUPREP BOWEL PREP KIT 17.5-3.13-1.6 GM/177ML PO SOLN: 1.0000 | Freq: Once | ORAL | Status: DC

## 2014-03-17 NOTE — Progress Notes (Signed)
No allergies to eggs or soy No diet/weight loss meds No home oxygen (CPAP ONLY) No past problems with anesthesia  Has email  Emmi instructions given for colonoscopy

## 2014-03-24 ENCOUNTER — Encounter: Payer: Self-pay | Admitting: Gastroenterology

## 2014-03-31 ENCOUNTER — Encounter: Payer: 59 | Admitting: Gastroenterology

## 2014-04-01 ENCOUNTER — Encounter: Payer: Self-pay | Admitting: Gastroenterology

## 2014-04-02 ENCOUNTER — Encounter (HOSPITAL_COMMUNITY)
Admission: RE | Disposition: A | Discharge status: Home / Self Care | Payer: Self-pay | Source: Ambulatory Visit | Attending: Gastroenterology

## 2014-04-02 ENCOUNTER — Encounter (HOSPITAL_COMMUNITY): Payer: Self-pay | Admitting: *Deleted

## 2014-04-02 ENCOUNTER — Ambulatory Visit (HOSPITAL_COMMUNITY)
Admission: RE | Admit: 2014-04-02 | Discharge: 2014-04-02 | Disposition: A | Discharge status: Home / Self Care | Payer: 59 | Source: Ambulatory Visit | Attending: Gastroenterology | Admitting: Gastroenterology

## 2014-04-02 DIAGNOSIS — G5600 Carpal tunnel syndrome, unspecified upper limb: Secondary | ICD-10-CM | POA: Insufficient documentation

## 2014-04-02 DIAGNOSIS — Z1211 Encounter for screening for malignant neoplasm of colon: Secondary | ICD-10-CM | POA: Insufficient documentation

## 2014-04-02 DIAGNOSIS — G2581 Restless legs syndrome: Secondary | ICD-10-CM | POA: Insufficient documentation

## 2014-04-02 DIAGNOSIS — R011 Cardiac murmur, unspecified: Secondary | ICD-10-CM | POA: Insufficient documentation

## 2014-04-02 DIAGNOSIS — M1990 Unspecified osteoarthritis, unspecified site: Secondary | ICD-10-CM | POA: Diagnosis not present

## 2014-04-02 DIAGNOSIS — G4730 Sleep apnea, unspecified: Secondary | ICD-10-CM | POA: Diagnosis not present

## 2014-04-02 DIAGNOSIS — Z86010 Personal history of colonic polyps: Secondary | ICD-10-CM | POA: Diagnosis not present

## 2014-04-02 DIAGNOSIS — K5730 Diverticulosis of large intestine without perforation or abscess without bleeding: Secondary | ICD-10-CM | POA: Diagnosis not present

## 2014-04-02 DIAGNOSIS — K635 Polyp of colon: Secondary | ICD-10-CM | POA: Diagnosis not present

## 2014-04-02 DIAGNOSIS — Z881 Allergy status to other antibiotic agents status: Secondary | ICD-10-CM | POA: Insufficient documentation

## 2014-04-02 DIAGNOSIS — Z885 Allergy status to narcotic agent status: Secondary | ICD-10-CM | POA: Diagnosis not present

## 2014-04-02 DIAGNOSIS — E785 Hyperlipidemia, unspecified: Secondary | ICD-10-CM | POA: Insufficient documentation

## 2014-04-02 DIAGNOSIS — F319 Bipolar disorder, unspecified: Secondary | ICD-10-CM | POA: Diagnosis not present

## 2014-04-02 DIAGNOSIS — G4700 Insomnia, unspecified: Secondary | ICD-10-CM | POA: Insufficient documentation

## 2014-04-02 DIAGNOSIS — D122 Benign neoplasm of ascending colon: Secondary | ICD-10-CM

## 2014-04-02 HISTORY — PX: HOT HEMOSTASIS: SHX5433

## 2014-04-02 HISTORY — PX: COLONOSCOPY WITH PROPOFOL: SHX5780

## 2014-04-02 SURGERY — COLONOSCOPY WITH PROPOFOL
Anesthesia: Moderate Sedation

## 2014-04-02 MED ORDER — FENTANYL CITRATE 0.05 MG/ML IJ SOLN
INTRAMUSCULAR | Status: DC | PRN
  Administered 2014-04-02 (×2): 25 ug via INTRAVENOUS

## 2014-04-02 MED ORDER — MIDAZOLAM HCL 10 MG/2ML IJ SOLN
INTRAMUSCULAR | Status: AC
  Filled 2014-04-02: qty 2

## 2014-04-02 MED ORDER — FENTANYL CITRATE 0.05 MG/ML IJ SOLN
INTRAMUSCULAR | Status: AC
  Filled 2014-04-02: qty 2

## 2014-04-02 MED ORDER — MIDAZOLAM HCL 10 MG/2ML IJ SOLN
INTRAMUSCULAR | Status: DC | PRN
  Administered 2014-04-02 (×2): 2 mg via INTRAVENOUS

## 2014-04-02 MED ORDER — SODIUM CHLORIDE 0.9 % IV SOLN
INTRAVENOUS | Status: DC
  Administered 2014-04-02: 11:00:00 via INTRAVENOUS

## 2014-04-02 SURGICAL SUPPLY — 21 items

## 2014-04-02 NOTE — Op Note (Signed)
[facility], [id number]   COLONOSCOPY PROCEDURE REPORT     EXAM DATE: 04/02/2014  PATIENT NAME:      Harry Carroll, Harry Carroll           MR #:      [id number]  BIRTHDATE:       1960/12/16      VISIT #:     [phone]  ATTENDING:     Inda Castle, MD     STATUS:     outpatient REFERRING MD: ASA CLASS:        Class II  INDICATIONS:  The patient is a 53 yr old male here for a colonoscopy due to high risk personal history of colonic polyps.  2014 colonic polyposis with high-grade dysplasia PROCEDURE PERFORMED:     Colonoscopy with cold biopsy polypectomy  MEDICATIONS:     Versed 4 mg IV and Fentanyl 50 mcg IV ESTIMATED BLOOD LOSS:     None  CONSENT: The patient understands the risks and benefits of the procedure and understands that these risks include, but are not limited to: sedation, allergic reaction, infection, perforation and/or bleeding. Alternative means of evaluation and treatment include, among others: physical exam, x-rays, and/or surgical intervention. The patient elects to proceed with this endoscopic procedure.  DESCRIPTION OF PROCEDURE: During intra-op preparation period all mechanical & medical equipment was checked for proper function. Hand hygiene and appropriate measures for infection prevention was taken. After the risks, benefits and alternatives of the procedure were thoroughly explained, Informed consent was verified, confirmed and timeout was successfully executed by the treatment team. A digital exam revealed no abnormalities of the rectum.      The Pentax Adult Colonscope [id number] endoscope was introduced through the anus and advanced to the cecum, which was identified by both the appendix and ileocecal valve. No adverse events experienced. The prep was excellent using Suprep. The instrument was then slowly withdrawn as the colon was fully examined.   COLON FINDINGS: A sessile polyp measuring 2 mm in size was  found in the ascending colon.  A polypectomy was performed with cold forceps.   The examination was otherwise normal.   There was mild diverticulosis noted in the sigmoid colon. Retroflexed views revealed no abnormalities.  The scope was then completely withdrawn from the patient and the procedure terminated. WITHDRAWAL TIME: 8 minutes 0 seconds    ADVERSE EVENTS:      There were no immediate complications.  IMPRESSIONS:     1.  Sessile polyp measuring 2 mm in size was found in the ascending colon; polypectomy was performed with cold forceps  2.  diverticulosis 3.  The examination was otherwise normal  RECOMMENDATIONS:     Colonoscopy  5 years RECALL:  Inda Castle, MD eSigned:  Inda Castle, MD 04/02/2014 1:40 PM   cc:  Kathlene November, MD  CPT CODES: ICD CODES:  The ICD and CPT codes recommended by this software are interpretations from the data that the clinical staff has captured with the software.  The verification of the translation of this report to the ICD and CPT codes and modifiers is the sole responsibility of the health care institution and practicing physician where this report was generated.  [employer] will not be held responsible for the validity of the ICD and CPT codes included on this report.  AMA assumes no liability for data contained or not contained herein. CPT is a Designer, television/film set of the  American Medical Association.   PATIENT NAME:  Harry Carroll, Harry Carroll MR#: [id number]

## 2014-04-02 NOTE — H&P (Signed)
_                                                                                                                History of Present Illness:  Mr. Harry Carroll is a 53 year old white male history: Positive for followup colonoscopy.  One year ago multiple colon polyps were removed.  A single polyp in the descending colon showed high-grade dysplasia.  This is a followup examination.  Patient has no GI complaints.   Past Medical History  Diagnosis Date  . Colon polyps     adenomatous  . Diverticulitis   . Hyperlipemia   . Insomnia   . Arthritis   . Carpal tunnel syndrome     saw Dr Kristie Cowman   . Heart murmur   . Restless leg   . Sleep apnea   . Bipolar 1 disorder     Dr Clovis Pu   . Normal cardiac stress test     CP 01-2009---Nl ECHO and stress test neg   Past Surgical History  Procedure Laterality Date  . Maxillofacial surgery    . Right shoulder surgery      X2  . Right knee arthroscopy      2007  . Shoulder surgery      LEFT  . Partial colectomy  12/20/2011    Procedure: PARTIAL COLECTOMY;  Surgeon: Earnstine Regal, MD;  Location: [facility];  Service: General;  Laterality: N/A;  Sigmoid colectomy  . Cystoscopy with urethral dilatation  12/20/2011    Procedure: CYSTOSCOPY WITH URETHRAL DILATATION;  Surgeon: Earnstine Regal, MD;  Location: [facility];  Service: General;;  with foley insertion  . Cystoscopy with urethral dilatation  12/20/2011    Procedure: CYSTOSCOPY WITH URETHRAL DILATATION;  Surgeon: Franchot Gallo, MD;  Location: [facility];  Service: Urology;;   family history includes Cancer in his paternal aunt; Prostate cancer in his paternal grandfather. There is no history of Colon cancer, Diabetes, CAD, Pancreatic cancer, Rectal cancer, or Stomach cancer. Current Facility-Administered Medications  Medication Dose Route Frequency Provider Last Rate Last Dose  . 0.9 %  sodium chloride infusion   Intravenous Continuous Inda Castle, MD 20 mL/hr at 04/02/14 1121     Allergies  as of 02/07/2014 - Review Complete 01/17/2014  Allergen Reaction Noted  . Ciprofloxacin Shortness Of Breath, Nausea And Vomiting, Nausea Only, Rash, and Other (See Comments) 11/23/2011  . Dilaudid [hydromorphone hcl] Shortness Of Breath and Nausea Only 11/23/2011  . Hydrocodone Itching     reports that he has never smoked. His smokeless tobacco use includes Snuff. He reports that he drinks alcohol. He reports that he does not use illicit drugs.   Review of Systems: Pertinent positive and negative review of systems were noted in the above HPI section. All other review of systems were otherwise negative.  Vital signs were reviewed in today's medical record Physical Exam: General: Well developed , well nourished, no acute distress Skin: anicteric Head:  Normocephalic and atraumatic Eyes:  sclerae anicteric, EOMI Ears: Normal auditory acuity Mouth: No deformity or lesions Neck: Supple, no masses or thyromegaly Lungs: Clear throughout to auscultation Heart: Regular rate and rhythm; no murmurs, rubs or bruits Abdomen: Soft, non tender and non distended. No masses, hepatosplenomegaly or hernias noted. Normal Bowel sounds Rectal:deferred Musculoskeletal: Symmetrical with no gross deformities  Skin: No lesions on visible extremities Pulses:  Normal pulses noted Extremities: No clubbing, cyanosis, edema or deformities noted Neurological: Alert oriented x 4, grossly nonfocal Cervical Nodes:  No significant cervical adenopathy Inguinal Nodes: No significant inguinal adenopathy Psychological:  Alert and cooperative. Normal mood and affect  Impression - history of colonic polyposis with high-grade dysplasia one year ago  Plan followup colonoscopy

## 2014-04-02 NOTE — Discharge Instructions (Signed)
Colonoscopy, Care After °These instructions give you information on caring for yourself after your procedure. Your doctor may also give you more specific instructions. Call your doctor if you have any problems or questions after your procedure. °HOME CARE °· Do not drive for 24 hours. °· Do not sign important papers or use machinery for 24 hours. °· You may shower. °· You may go back to your usual activities, but go slower for the first 24 hours. °· Take rest breaks often during the first 24 hours. °· Walk around or use warm packs on your belly (abdomen) if you have belly cramping or gas. °· Drink enough fluids to keep your pee (urine) clear or pale yellow. °· Resume your normal diet. Avoid heavy or fried foods. °· Avoid drinking alcohol for 24 hours or as told by your doctor. °· Only take medicines as told by your doctor. °If a tissue sample (biopsy) was taken during the procedure:  °· Do not take aspirin or blood thinners for 7 days, or as told by your doctor. °· Do not drink alcohol for 7 days, or as told by your doctor. °· Eat soft foods for the first 24 hours. °GET HELP IF: °You still have a small amount of blood in your poop (stool) 2-3 days after the procedure. °GET HELP RIGHT AWAY IF: °· You have more than a small amount of blood in your poop. °· You see clumps of tissue (blood clots) in your poop. °· Your belly is puffy (swollen). °· You feel sick to your stomach (nauseous) or throw up (vomit). °· You have a fever. °· You have belly pain that gets worse and medicine does not help. °MAKE SURE YOU: °· Understand these instructions. °· Will watch your condition. °· Will get help right away if you are not doing well or get worse. °Document Released: 07/02/2010 Document Revised: 06/04/2013 Document Reviewed: 02/04/2013 °ExitCare® Patient Information ©2015 [employer]. This information is not intended to replace advice given to you by your health care provider. Make sure you discuss any questions you have with  your health care provider. °Colon Polyps °Polyps are lumps of extra tissue growing inside the body. Polyps can grow in the large intestine (colon). Most colon polyps are noncancerous (benign). However, some colon polyps can become cancerous over time. Polyps that are larger than a pea may be harmful. To be safe, caregivers remove and test all polyps. °CAUSES  °Polyps form when mutations in the genes cause your cells to grow and divide even though no more tissue is needed. °RISK FACTORS °There are a number of risk factors that can increase your chances of getting colon polyps. They include: °· Being older than 50 years. °· Family history of colon polyps or colon cancer. °· Long-term colon diseases, such as colitis or Crohn disease. °· Being overweight. °· Smoking. °· Being inactive. °· Drinking too much alcohol. °SYMPTOMS  °Most small polyps do not cause symptoms. If symptoms are present, they may include: °· Blood in the stool. The stool may look dark red or black. °· Constipation or diarrhea that lasts longer than 1 week. °DIAGNOSIS °People often do not know they have polyps until their caregiver finds them during a regular checkup. Your caregiver can use 4 tests to check for polyps: °· Digital rectal exam. The caregiver wears gloves and feels inside the rectum. This test would find polyps only in the rectum. °· Barium enema. The caregiver puts a liquid called barium into your rectum before taking X-rays   of your colon. Barium makes your colon look white. Polyps are dark, so they are easy to see in the X-ray pictures. °· Sigmoidoscopy. A thin, flexible tube (sigmoidoscope) is placed into your rectum. The sigmoidoscope has a light and tiny camera in it. The caregiver uses the sigmoidoscope to look at the last third of your colon. °· Colonoscopy. This test is like sigmoidoscopy, but the caregiver looks at the entire colon. This is the most common method for finding and removing polyps. °TREATMENT  °Any polyps will be  removed during a sigmoidoscopy or colonoscopy. The polyps are then tested for cancer. °PREVENTION  °To help lower your risk of getting more colon polyps: °· Eat plenty of fruits and vegetables. Avoid eating fatty foods. °· Do not smoke. °· Avoid drinking alcohol. °· Exercise every day. °· Lose weight if recommended by your caregiver. °· Eat plenty of calcium and folate. Foods that are rich in calcium include milk, cheese, and broccoli. Foods that are rich in folate include chickpeas, kidney beans, and spinach. °HOME CARE INSTRUCTIONS °Keep all follow-up appointments as directed by your caregiver. You may need periodic exams to check for polyps. °SEEK MEDICAL CARE IF: °You notice bleeding during a bowel movement. °Document Released: 02/24/2004 Document Revised: 08/22/2011 Document Reviewed: 08/09/2011 °ExitCare® Patient Information ©2015 [employer]. This information is not intended to replace advice given to you by your health care provider. Make sure you discuss any questions you have with your health care provider. ° °

## 2014-04-03 ENCOUNTER — Encounter (HOSPITAL_COMMUNITY): Payer: Self-pay | Admitting: Gastroenterology

## 2014-04-07 ENCOUNTER — Encounter: Payer: Self-pay | Admitting: Gastroenterology

## 2014-06-04 ENCOUNTER — Emergency Department (INDEPENDENT_AMBULATORY_CARE_PROVIDER_SITE_OTHER)
Admission: EM | Admit: 2014-06-04 | Discharge: 2014-06-04 | Disposition: A | Discharge status: Home / Self Care | Payer: 59 | Source: Home / Self Care | Attending: Emergency Medicine | Admitting: Emergency Medicine

## 2014-06-04 ENCOUNTER — Emergency Department (INDEPENDENT_AMBULATORY_CARE_PROVIDER_SITE_OTHER): Payer: 59

## 2014-06-04 ENCOUNTER — Encounter (HOSPITAL_COMMUNITY): Payer: Self-pay

## 2014-06-04 DIAGNOSIS — T148 Other injury of unspecified body region: Secondary | ICD-10-CM

## 2014-06-04 DIAGNOSIS — S8990XA Unspecified injury of unspecified lower leg, initial encounter: Secondary | ICD-10-CM

## 2014-06-04 DIAGNOSIS — T148XXA Other injury of unspecified body region, initial encounter: Secondary | ICD-10-CM

## 2014-06-04 NOTE — Discharge Instructions (Signed)
RICE: Routine Care for Injuries The routine care of many injuries includes Rest, Ice, Compression, and Elevation (RICE). HOME CARE INSTRUCTIONS  Rest is needed to allow your body to heal. Routine activities can usually be resumed when comfortable. Injured tendons and bones can take up to 6 weeks to heal. Tendons are the cord-like structures that attach muscle to bone.  Ice following an injury helps keep the swelling down and reduces pain.  Put ice in a plastic bag.  Place a towel between your skin and the bag.  Leave the ice on for 15-20 minutes, 3-4 times a day, or as directed by your health care provider. Do this while awake, for the first 24 to 48 hours. After that, continue as directed by your caregiver.  Compression helps keep swelling down. It also gives support and helps with discomfort. If an elastic bandage has been applied, it should be removed and reapplied every 3 to 4 hours. It should not be applied tightly, but firmly enough to keep swelling down. Watch fingers or toes for swelling, bluish discoloration, coldness, numbness, or excessive pain. If any of these problems occur, remove the bandage and reapply loosely. Contact your caregiver if these problems continue.  Elevation helps reduce swelling and decreases pain. With extremities, such as the arms, hands, legs, and feet, the injured area should be placed near or above the level of the heart, if possible. SEEK IMMEDIATE MEDICAL CARE IF:  You have persistent pain and swelling.  You develop redness, numbness, or unexpected weakness.  Your symptoms are getting worse rather than improving after several days. These symptoms may indicate that further evaluation or further X-rays are needed. Sometimes, X-rays may not show a small broken bone (fracture) until 1 week or 10 days later. Make a follow-up appointment with your caregiver. Ask when your X-ray results will be ready. Make sure you get your X-ray results. Document Released:  09/11/2000 Document Revised: 06/04/2013 Document Reviewed: 10/29/2010 ExitCare Patient Information 2015 [employer]. This information is not intended to replace advice given to you by your health care provider. Make sure you discuss any questions you have with your health care provider.  

## 2014-06-04 NOTE — ED Notes (Signed)
States he accidentally struck right proximal tibia on metal object 12-20. Puncture type wound noted, w ecchymosis radiating into medical aspect of calf. C/o painful weight bearing. Good pulses w doppler in posterior tibial and dorsal pedal area. NAD

## 2014-06-04 NOTE — ED Provider Notes (Signed)
Chief Complaint    Leg Injury   History of Present Illness     Harry Carroll is a 53 year old male who bumped his right pretibial surface on a trailer hitch, just below his knee this past Sunday, 4 days ago, while he was helping his child moved back home from [employer]. He sustained a small puncture type wound, and immediately developed a large bruise in the area which was about the size of an egg. He immediately applied ice and Ace wrap and the bruise went down, but now he has extensive ecchymosis extending from the tibial tubercle down to the ankle. It's very painful, hurts him to stand and walk. It hurts him at nighttime when he is in bed. He denies any fever or chills. There is no numbness, tingling, or weakness in the leg.  Review of Systems     Other than as noted above, the patient denies any of the following symptoms: Systemic:  No fevers or chills.   Musculoskeletal:  No arthritis, back pain, or neck pain. Neurological:  No muscular weakness or paresthesias.  [place]    Past medical history, family history, social history, meds, and allergies were reviewed.    Physical Exam    Vital signs:  BP 119/55 mmHg  Pulse 95  Temp(Src) 99.9 F (37.7 C) (Oral)  SpO2 98% Gen:  Alert and oriented times 3.  In no distress. Musculoskeletal: There is a small, healed puncture wound just below the tibial tubercle. There is no surrounding erythema and no purulent drainage. He has extensive ecchymosis of the entire lower leg extending from the tibial tubercle to the ankle. He has diffuse tenderness to palpation especially anteriorly. There is minimal tenderness posteriorly Homans sign is negative. Pedal pulses are full. He has good capillary refill, normal strength and sensation. Full range of motion of knee and ankle without pain.  Otherwise, all joints had a full a ROM with no swelling, bruising or deformity.  No edema, pulses full. Extremities were warm and pink.  Capillary  refill was brisk.  Skin:  Clear, warm and dry.  No rash. Neuro:  Alert and oriented times 3.  Muscle strength was normal.  Sensation was intact to light touch.   Radiology     Dg Tibia/fibula Right  06/04/2014   CLINICAL DATA:  Right leg pain following hitting leg on the truck hitch five days previous, initial encounter  EXAM: RIGHT TIBIA AND FIBULA - 2 VIEW  COMPARISON:  None.  FINDINGS: Exuberant bony overgrowth is noted at the tibial tubercle. No acute fracture or dislocation is seen. No gross soft tissue abnormality is noted.  IMPRESSION: No acute abnormality noted.   Electronically Signed   By: Inez Catalina M.D.   On: 06/04/2014 10:12    I reviewed the images independently and personally and concur with the radiologist's findings.   Assessment    The primary encounter diagnosis was Hematoma. A diagnosis of Leg injury was also pertinent to this visit.  Plan   1.  Meds:  The following meds were prescribed:   Discharge Medication List as of 06/04/2014 10:31 AM      2.  Patient Education/Counseling:  The patient was given appropriate handouts, self care instructions, and instructed in pain control, rest and activity, elevation, application of ice and compression.  I told him this would take about a month for it to go away completely.  3.  Follow up:  The patient was told to follow up here  if no better in 3 to 4 days, or sooner if becoming worse in any way, and given some red flag symptoms such as worsening pain or new neurological symptoms which would prompt immediate return.   Okay yet unknown amount  Harden Mo, MD 06/04/14 [phone]

## 2014-06-10 ENCOUNTER — Emergency Department (INDEPENDENT_AMBULATORY_CARE_PROVIDER_SITE_OTHER)
Admission: EM | Admit: 2014-06-10 | Discharge: 2014-06-10 | Disposition: A | Discharge status: Home / Self Care | Payer: 59 | Source: Home / Self Care | Attending: Family Medicine | Admitting: Family Medicine

## 2014-06-10 ENCOUNTER — Encounter (HOSPITAL_COMMUNITY): Payer: Self-pay | Admitting: *Deleted

## 2014-06-10 DIAGNOSIS — S8011XD Contusion of right lower leg, subsequent encounter: Secondary | ICD-10-CM

## 2014-06-10 NOTE — ED Provider Notes (Signed)
CSN: [id number]     Arrival date & time 06/10/14  1024 History   First MD Initiated Contact with Patient 06/10/14 1044     Chief Complaint  Patient presents with  . Follow-up   (Consider location/radiation/quality/duration/timing/severity/associated sxs/prior Treatment) Patient is a 53 y.o. male presenting with leg pain. The history is provided by the patient.  Leg Pain Location:  Leg Injury: yes   Mechanism of injury comment:  Seen 12/23 for contusion to leg on trailer hitch, had lg hematoma, no complications, now sore with distal srpead of ecchymoses, no erythema or heat. Leg location:  R lower leg Pain details:    Quality:  Dull   Radiates to:  Does not radiate Chronicity:  New Dislocation: no   Foreign body present:  No foreign bodies Tetanus status:  Up to date Prior injury to area:  No   Past Medical History  Diagnosis Date  . Colon polyps     adenomatous  . Diverticulitis   . Hyperlipemia   . Insomnia   . Arthritis   . Carpal tunnel syndrome     saw Dr Kristie Cowman   . Heart murmur   . Restless leg   . Sleep apnea   . Bipolar 1 disorder     Dr Clovis Pu   . Normal cardiac stress test     CP 01-2009---Nl ECHO and stress test neg   Past Surgical History  Procedure Laterality Date  . Maxillofacial surgery    . Right shoulder surgery      X2  . Right knee arthroscopy      2007  . Shoulder surgery      LEFT  . Partial colectomy  12/20/2011    Procedure: PARTIAL COLECTOMY;  Surgeon: Earnstine Regal, MD;  Location: [facility];  Service: General;  Laterality: N/A;  Sigmoid colectomy  . Cystoscopy with urethral dilatation  12/20/2011    Procedure: CYSTOSCOPY WITH URETHRAL DILATATION;  Surgeon: Earnstine Regal, MD;  Location: [facility];  Service: General;;  with foley insertion  . Cystoscopy with urethral dilatation  12/20/2011    Procedure: CYSTOSCOPY WITH URETHRAL DILATATION;  Surgeon: Franchot Gallo, MD;  Location: [facility];  Service: Urology;;  . Colonoscopy with propofol N/A  04/02/2014    Procedure: COLONOSCOPY WITH PROPOFOL;  Surgeon: Inda Castle, MD;  Location: [facility];  Service: Endoscopy;  Laterality: N/A;  . Hot hemostasis N/A 04/02/2014    Procedure: HOT HEMOSTASIS (ARGON PLASMA COAGULATION/BICAP);  Surgeon: Inda Castle, MD;  Location: [facility];  Service: Endoscopy;  Laterality: N/A;   Family History  Problem Relation Age of Onset  . Prostate cancer Paternal Grandfather   . Cancer Paternal Aunt     lymph cancer  . Colon cancer Neg Hx   . Diabetes Neg Hx   . CAD Neg Hx   . Pancreatic cancer Neg Hx   . Rectal cancer Neg Hx   . Stomach cancer Neg Hx    History  Substance Use Topics  . Smoking status: Never Smoker   . Smokeless tobacco: Current User    Types: Snuff  . Alcohol Use: Yes     Comment: OCCASIONAL    Review of Systems  Constitutional: Negative.   Musculoskeletal: Positive for myalgias. Negative for gait problem.  Skin: Positive for wound.    Allergies  Ciprofloxacin; Dilaudid; and Hydrocodone  Home Medications   Prior to Admission medications   Medication Sig Start Date End Date Taking? Authorizing Provider  divalproex (DEPAKOTE) 500  MG 24 hr tablet Take 1,000 mg by mouth every evening.     Historical Provider, MD  lamoTRIgine (LAMICTAL) 100 MG tablet Take 100 mg by mouth every evening.     Historical Provider, MD  Opal Take 1 kit by mouth once. 03/17/14   Inda Castle, MD  zolpidem (AMBIEN) 10 MG tablet Take 5 mg by mouth at bedtime as needed. For sleep    Historical Provider, MD   BP 128/79 mmHg  Pulse 86  Temp(Src) 99.4 F (37.4 C) (Oral)  Resp 16  SpO2 96% Physical Exam  Constitutional: He is oriented to person, place, and time. He appears well-developed and well-nourished.  Musculoskeletal: Normal range of motion. He exhibits tenderness.  Extensive right lower leg ecchymosis , knee intact, no sign of infection, min soreness.  Neurological: He is alert and oriented to person,  place, and time.  Skin: Skin is warm and dry. No erythema.  Nursing note and vitals reviewed.   ED Course  Procedures (including critical care time) Labs Review Labs Reviewed - No data to display  Imaging Review No results found.   MDM   1. Contusion, lower leg, right, subsequent encounter        Billy Fischer, MD 06/10/14 1114

## 2014-06-10 NOTE — ED Notes (Signed)
Pt  Seen  ucc      6  Days   Ago  For  A  Recheck        Of  A  Leg  Injury     Which  Occurred    8  Days  Ago  Continues  To have   Symptoms    Of pain  And  Swelling      He  Is  Ambulatory

## 2014-06-10 NOTE — Discharge Instructions (Signed)
Heat, elevate as possible, return if problems.

## 2014-06-26 ENCOUNTER — Encounter: Payer: Self-pay | Admitting: Family

## 2014-06-26 ENCOUNTER — Ambulatory Visit (INDEPENDENT_AMBULATORY_CARE_PROVIDER_SITE_OTHER): Payer: 59 | Admitting: Family

## 2014-06-26 VITALS — BP 124/78 | HR 77 | Temp 98.1°F | Resp 16 | Ht 69.0 in | Wt 244.0 lb

## 2014-06-26 DIAGNOSIS — M67439 Ganglion, unspecified wrist: Secondary | ICD-10-CM | POA: Insufficient documentation

## 2014-06-26 DIAGNOSIS — M67431 Ganglion, right wrist: Secondary | ICD-10-CM

## 2014-06-26 NOTE — Progress Notes (Signed)
   Subjective:    Patient ID: Harry Carroll, male    DOB: 10-02-1960, 54 y.o.   MRN: [id number]  Chief Complaint  Patient presents with  . Establish Care  . cyst on Right wrist    HPI:  Harry Carroll is a 54 y.o. male who presents today to establish care and discuss a cyst on his right wrist.   1) Acute cyst came up over the last couple of weeks located on posterior aspect of his wrist that he describes as burning on occasion. Has kept it wrapped which has helped. Denies any changes in size recently.   Allergies  Allergen Reactions  . Ciprofloxacin Shortness Of Breath, Nausea And Vomiting, Nausea Only, Rash and Other (See Comments)    Marked mucous production  . Dilaudid [Hydromorphone Hcl] Shortness Of Breath and Nausea Only    Patient developed rash, excessive mucous production and chest pain.  Marland Kitchen Hydrocodone Itching    Current Outpatient Prescriptions on File Prior to Visit  Medication Sig Dispense Refill  . divalproex (DEPAKOTE) 500 MG 24 hr tablet Take 1,000 mg by mouth every evening.     . lamoTRIgine (LAMICTAL) 100 MG tablet Take 100 mg by mouth every evening.     . zolpidem (AMBIEN) 10 MG tablet Take 5 mg by mouth at bedtime as needed. For sleep     No current facility-administered medications on file prior to visit.     Review of Systems  Constitutional: Negative for fever and chills.  Musculoskeletal:       Negative for trauma to the wrist or wrist pain  Skin:       Positive for cyst on right wrist      Objective:    BP 124/78 mmHg  Pulse 77  Temp(Src) 98.1 F (36.7 C) (Oral)  Resp 16  Ht 5\' 9"  (1.753 m)  Wt 244 lb (110.678 kg)  BMI 36.02 kg/m2  SpO2 97% Nursing note and vital signs reviewed.  Physical Exam  Constitutional: He is oriented to person, place, and time. He appears well-developed and well-nourished. No distress.  Cardiovascular: Normal rate, regular rhythm, normal heart sounds and intact distal pulses.   Pulmonary/Chest: Effort normal  and breath sounds normal.  Musculoskeletal:  No obvious discoloration right wrist noted mild deformity present with appearance of a cyst. Cyst is firm and somewhat mobile. No tenderness elicited. No redness or swelling noted.  Neurological: He is alert and oriented to person, place, and time.  Skin: Skin is warm and dry.  Psychiatric: He has a normal mood and affect. His behavior is normal. Judgment and thought content normal.       Assessment & Plan:

## 2014-06-26 NOTE — Patient Instructions (Addendum)
Thank you for choosing [facility].  Summary/Instructions:  Schedule a time for your physical.   If your symptoms worsen or fail to improve, please contact our office for further instruction, or in case of emergency go directly to the emergency room at the closest medical facility.   Ganglion Cyst A ganglion cyst is a noncancerous, fluid-filled lump that occurs near joints or tendons. The ganglion cyst grows out of a joint or the lining of a tendon. It most often develops in the hand or wrist but can also develop in the shoulder, elbow, hip, knee, ankle, or foot. The round or oval ganglion can be pea sized or larger than a grape. Increased activity may enlarge the size of the cyst because more fluid starts to build up.  CAUSES  It is not completely known what causes a ganglion cyst to grow. However, it may be related to:  Inflammation or irritation around the joint.  An injury.  Repetitive movements or overuse.  Arthritis. SYMPTOMS  A lump most often appears in the hand or wrist, but can occur in other areas of the body. Generally, the lump is painless without other symptoms. However, sometimes pain can be felt during activity or when pressure is applied to the lump. The lump may even be tender to the touch. Tingling, pain, numbness, or muscle weakness can occur if the ganglion cyst presses on a nerve. Your grip may be weak and you may have less movement in your joints.  DIAGNOSIS  Ganglion cysts are most often diagnosed based on a physical exam, noting where the cyst is and how it looks. Your caregiver will feel the lump and may shine a light alongside it. If it is a ganglion, a light often shines through it. Your caregiver may order an X-ray, ultrasound, or MRI to rule out other conditions. TREATMENT  Ganglions usually go away on their own without treatment. If pain or other symptoms are involved, treatment may be needed. Treatment is also needed if the ganglion limits your  movement or if it gets infected. Treatment options include:  Wearing a wrist or finger brace or splint.  Taking anti-inflammatory medicine.  Draining fluid from the lump with a needle (aspiration).  Injecting a steroid into the joint.  Surgery to remove the ganglion cyst and its stalk that is attached to the joint or tendon. However, ganglion cysts can grow back. HOME CARE INSTRUCTIONS   Do not press on the ganglion, poke it with a needle, or hit it with a heavy object. You may rub the lump gently and often. Sometimes fluid moves out of the cyst.  Only take medicines as directed by your caregiver.  Wear your brace or splint as directed by your caregiver. SEEK MEDICAL CARE IF:   Your ganglion becomes larger or more painful.  You have increased redness, red streaks, or swelling.  You have pus coming from the lump.  You have weakness or numbness in the affected area. MAKE SURE YOU:   Understand these instructions.  Will watch your condition.  Will get help right away if you are not doing well or get worse. Document Released: 05/27/2000 Document Revised: 02/22/2012 Document Reviewed: 07/24/2007 [facility] Patient Information 2015 [place], [place]. This information is not intended to replace advice given to you by your health care provider. Make sure you discuss any questions you have with your health care provider.

## 2014-06-26 NOTE — Assessment & Plan Note (Signed)
Exam consistent with ganglionic cyst of the right wrist. Recommend conservative treatment at this time. If symptoms worsen, consider aspiration and drainage. Follow up if symptoms worsen or fail to improve.

## 2014-06-26 NOTE — Progress Notes (Signed)
Pre visit review using our clinic review tool, if applicable. No additional management support is needed unless otherwise documented below in the visit note. 

## 2014-09-05 ENCOUNTER — Encounter: Payer: 59 | Admitting: Family

## 2014-10-21 ENCOUNTER — Encounter: Payer: Self-pay | Admitting: Gastroenterology

## 2014-10-23 ENCOUNTER — Ambulatory Visit (HOSPITAL_BASED_OUTPATIENT_CLINIC_OR_DEPARTMENT_OTHER): Admission: RE | Admit: 2014-10-23 | Payer: 59 | Source: Ambulatory Visit | Admitting: Specialist

## 2014-10-23 ENCOUNTER — Encounter (HOSPITAL_BASED_OUTPATIENT_CLINIC_OR_DEPARTMENT_OTHER): Admission: RE | Payer: Self-pay | Source: Ambulatory Visit

## 2014-10-23 SURGERY — ARTHROSCOPY, KNEE, WITH MEDIAL MENISCECTOMY
Anesthesia: General | Site: Knee | Laterality: Right

## 2014-11-10 ENCOUNTER — Encounter (HOSPITAL_COMMUNITY): Payer: Self-pay | Admitting: Emergency Medicine

## 2014-11-10 ENCOUNTER — Emergency Department (HOSPITAL_COMMUNITY)
Admission: EM | Admit: 2014-11-10 | Discharge: 2014-11-10 | Disposition: A | Discharge status: Home / Self Care | Payer: 59 | Attending: Emergency Medicine | Admitting: Emergency Medicine

## 2014-11-10 ENCOUNTER — Emergency Department (HOSPITAL_COMMUNITY): Payer: 59

## 2014-11-10 DIAGNOSIS — Y9289 Other specified places as the place of occurrence of the external cause: Secondary | ICD-10-CM | POA: Diagnosis not present

## 2014-11-10 DIAGNOSIS — Y9389 Activity, other specified: Secondary | ICD-10-CM | POA: Diagnosis not present

## 2014-11-10 DIAGNOSIS — Y998 Other external cause status: Secondary | ICD-10-CM | POA: Diagnosis not present

## 2014-11-10 DIAGNOSIS — Z7982 Long term (current) use of aspirin: Secondary | ICD-10-CM | POA: Diagnosis not present

## 2014-11-10 DIAGNOSIS — G4700 Insomnia, unspecified: Secondary | ICD-10-CM | POA: Diagnosis not present

## 2014-11-10 DIAGNOSIS — Z8719 Personal history of other diseases of the digestive system: Secondary | ICD-10-CM | POA: Insufficient documentation

## 2014-11-10 DIAGNOSIS — Z720 Tobacco use: Secondary | ICD-10-CM | POA: Insufficient documentation

## 2014-11-10 DIAGNOSIS — Z86010 Personal history of colonic polyps: Secondary | ICD-10-CM | POA: Insufficient documentation

## 2014-11-10 DIAGNOSIS — R011 Cardiac murmur, unspecified: Secondary | ICD-10-CM | POA: Insufficient documentation

## 2014-11-10 DIAGNOSIS — S20212A Contusion of left front wall of thorax, initial encounter: Secondary | ICD-10-CM | POA: Diagnosis not present

## 2014-11-10 DIAGNOSIS — R58 Hemorrhage, not elsewhere classified: Secondary | ICD-10-CM

## 2014-11-10 DIAGNOSIS — F319 Bipolar disorder, unspecified: Secondary | ICD-10-CM | POA: Insufficient documentation

## 2014-11-10 DIAGNOSIS — W1839XA Other fall on same level, initial encounter: Secondary | ICD-10-CM | POA: Insufficient documentation

## 2014-11-10 DIAGNOSIS — M1990 Unspecified osteoarthritis, unspecified site: Secondary | ICD-10-CM | POA: Diagnosis not present

## 2014-11-10 DIAGNOSIS — S299XXA Unspecified injury of thorax, initial encounter: Secondary | ICD-10-CM | POA: Diagnosis present

## 2014-11-10 DIAGNOSIS — Z79899 Other long term (current) drug therapy: Secondary | ICD-10-CM | POA: Insufficient documentation

## 2014-11-10 DIAGNOSIS — R0781 Pleurodynia: Secondary | ICD-10-CM

## 2014-11-10 MED ORDER — HYDROCODONE-ACETAMINOPHEN 5-325 MG PO TABS
1.0000 | ORAL_TABLET | Freq: Once | ORAL | Status: AC
  Administered 2014-11-10: 1 via ORAL
  Filled 2014-11-10: qty 1

## 2014-11-10 MED ORDER — OXYCODONE-ACETAMINOPHEN 5-325 MG PO TABS: 1.0000 | ORAL_TABLET | Freq: Four times a day (QID) | ORAL | Status: DC | PRN

## 2014-11-10 NOTE — ED Notes (Signed)
[place] c/[place]ided rib [place] a boat. Pt also has bruising to his LLQ, but denies injuring that area. Pt c/o extreme pain upon palpation of L rib cage and when coughing or taking deep breaths. Pt denies SOB, chest pain. Pt sts he feels like he has to breathe shallowly due to pain when taking deep breaths. Pt A&Ox4 and ambulatory. Denies N/V.

## 2014-11-10 NOTE — Discharge Instructions (Signed)
Return to the emergency room with worsening of symptoms, new symptoms or with symptoms that are concerning, , especially fevers, abdominal pain in one area, unable to keep down fluids, blood in stool or vomit, severe pain, you feel faint, lightheaded or pass out OR coughing up blood, chest pain that feels like a pressure, spreads to left arm or jaw, worse with exertion, associated with nausea, vomiting, shortness of breath and/or sweating.  Incentive spirometry 3-4 times a day. RICE: Rest, Ice (three cycles of 20 mins on, 17mins off at least twice a day), compression/brace, elevation. Heating pad works well for back pain. Ibuprofen 400mg  (2 tablets 200mg ) every 5-6 hours for 3-5 days. Percocet for severe pain. Do not operate machinery, drive or drink alcohol while taking narcotics or muscle relaxers. Take either claritin, zyrtec or allegra or benadryl for itching. Return to ED or primary care provider in 8-12 hours for abdominal exam recheck. Read below information and follow recommendations.  Chest Wall Pain Chest wall pain is pain in or around the bones and muscles of your chest. It may take up to 6 weeks to get better. It may take longer if you must stay physically active in your work and activities.  CAUSES  Chest wall pain may happen on its own. However, it may be caused by:  A viral illness like the flu.  Injury.  Coughing.  Exercise.  Arthritis.  Fibromyalgia.  Shingles. HOME CARE INSTRUCTIONS   Avoid overtiring physical activity. Try not to strain or perform activities that cause pain. This includes any activities using your chest or your abdominal and side muscles, especially if heavy weights are used.  Put ice on the sore area.  Put ice in a plastic bag.  Place a towel between your skin and the bag.  Leave the ice on for 15-20 minutes per hour while awake for the first 2 days.  Only take over-the-counter or prescription medicines for pain, discomfort, or fever as  directed by your caregiver. SEEK IMMEDIATE MEDICAL CARE IF:   Your pain increases, or you are very uncomfortable.  You have a fever.  Your chest pain becomes worse.  You have new, unexplained symptoms.  You have nausea or vomiting.  You feel sweaty or lightheaded.  You have a cough with phlegm (sputum), or you cough up blood. MAKE SURE YOU:   Understand these instructions.  Will watch your condition.  Will get help right away if you are not doing well or get worse. Document Released: 05/30/2005 Document Revised: 08/22/2011 Document Reviewed: 01/24/2011 [facility] Patient Information 2015 [place], [place]. This information is not intended to replace advice given to you by your health care provider. Make sure you discuss any questions you have with your health care provider.

## 2014-11-10 NOTE — ED Provider Notes (Signed)
CSN: [id number]     Arrival date & time 11/10/14  1708 History   First MD Initiated Contact with Patient 11/10/14 1801    This chart was scribed for non-physician practitioner, Al Corpus, PA-C, working with Malvin Johns, MD by Terressa Koyanagi, ED Scribe. This patient was seen in room [place] and the patient's care was started at [place] [facility].  Chief Complaint  Patient presents with  . rib cage pain    The history is provided by the patient. No language interpreter was used.   PCP: Mauricio Po, FNP HPI Comments: BOLDEN HAGERMAN is a 54 y.o. male, with PMH noted below, who presents to the Emergency Department complaining of a fall sustained 2 days ago and associated left sided rib cage pain that is worsened with deep breathing and cough. Pt also complains of associated bruising to his LLQ; and notes one episode of cough with bloody sputum this morning. No subsequent blood in sputum. Pt reports taking ibuprofen at home with some relief. Pt denies SOB, fever, abd pain, chills, n/v, appetite change, chest pain. Pt states he is allergic to Cipro and Dilaudid.   Past Medical History  Diagnosis Date  . Colon polyps     adenomatous  . Diverticulitis   . Hyperlipemia   . Insomnia   . Arthritis   . Heart murmur   . Restless leg   . Sleep apnea   . Bipolar 1 disorder     Dr Clovis Pu   . Normal cardiac stress test     CP 01-2009---Nl ECHO and stress test neg  . Carpal tunnel syndrome     saw Dr Kristie Cowman    Past Surgical History  Procedure Laterality Date  . Maxillofacial surgery    . Right shoulder surgery      X2  . Right knee arthroscopy      2007  . Shoulder surgery      LEFT  . Partial colectomy  12/20/2011    Procedure: PARTIAL COLECTOMY;  Surgeon: Earnstine Regal, MD;  Location: [facility] ORS;  Service: General;  Laterality: N/A;  Sigmoid colectomy  . Cystoscopy with urethral dilatation  12/20/2011    Procedure: CYSTOSCOPY WITH URETHRAL DILATATION;  Surgeon: Earnstine Regal, MD;  Location: [facility]  ORS;  Service: General;;  with foley insertion  . Cystoscopy with urethral dilatation  12/20/2011    Procedure: CYSTOSCOPY WITH URETHRAL DILATATION;  Surgeon: Franchot Gallo, MD;  Location: [facility] ORS;  Service: Urology;;  . Colonoscopy with propofol N/A 04/02/2014    Procedure: COLONOSCOPY WITH PROPOFOL;  Surgeon: Inda Castle, MD;  Location: [facility] ENDOSCOPY;  Service: Endoscopy;  Laterality: N/A;  . Hot hemostasis N/A 04/02/2014    Procedure: HOT HEMOSTASIS (ARGON PLASMA COAGULATION/BICAP);  Surgeon: Inda Castle, MD;  Location: [facility];  Service: Endoscopy;  Laterality: N/A;   Family History  Problem Relation Age of Onset  . Prostate cancer Paternal Grandfather   . Cancer Paternal Aunt     lymph cancer  . Colon cancer Neg Hx   . CAD Neg Hx   . Pancreatic cancer Neg Hx   . Rectal cancer Neg Hx   . Stomach cancer Neg Hx    History  Substance Use Topics  . Smoking status: Current Some Day Smoker    Types: Cigars  . Smokeless tobacco: Current User    Types: Snuff  . Alcohol Use: Yes     Comment: OCCASIONAL    Review of Systems  Constitutional: Negative for fever,  chills and appetite change.  Respiratory: Positive for cough (at baseline). Negative for shortness of breath.   Cardiovascular: Negative for chest pain.  Gastrointestinal: Negative for nausea, vomiting and abdominal pain.  Musculoskeletal:       Left sided rib cage pain   Skin: Positive for color change (bruising to LLQ).   Allergies  Ciprofloxacin; Dilaudid; and Hydrocodone  Home Medications   Prior to Admission medications   Medication Sig Start Date End Date Taking? Authorizing Provider  aspirin 81 MG tablet Take 81 mg by mouth 2 (two) times daily.   Yes Historical Provider, MD  divalproex (DEPAKOTE) 500 MG 24 hr tablet Take 1,000 mg by mouth every evening.    Yes Historical Provider, MD  ibuprofen (ADVIL,MOTRIN) 200 MG tablet Take 400 mg by mouth every 6 (six) hours as needed for moderate pain (pain).    Yes Historical Provider, MD  lamoTRIgine (LAMICTAL) 200 MG tablet Take 200 mg by mouth at bedtime. 08/11/14  Yes Historical Provider, MD  oxyCODONE-acetaminophen (PERCOCET/ROXICET) 5-325 MG per tablet Take 1 tablet by mouth every 6 (six) hours as needed for severe pain (pain). for pain 10/23/14  Yes Historical Provider, MD  traMADol (ULTRAM) 50 MG tablet Take 50 mg by mouth every 8 (eight) hours as needed for severe pain (pain). for pain 09/17/14  Yes Historical Provider, MD  zolpidem (AMBIEN) 10 MG tablet Take 5 mg by mouth at bedtime as needed for sleep (sleep). For sleep   Yes Historical Provider, MD  oxyCODONE-acetaminophen (PERCOCET/ROXICET) 5-325 MG per tablet Take 1 tablet by mouth every 6 (six) hours as needed for severe pain. 11/10/14   Al Corpus, PA-C   Triage Vitals: BP 126/75 mmHg  Pulse 84  Temp(Src) 98.2 F (36.8 C) (Oral)  Resp 16  SpO2 100% Physical Exam  Constitutional: He appears well-developed and well-nourished. No distress.  HENT:  Head: Normocephalic and atraumatic.  Eyes: Conjunctivae and EOM are normal. Right eye exhibits no discharge. Left eye exhibits no discharge.  Cardiovascular: Normal rate and regular rhythm.   Pulmonary/Chest: Effort normal and breath sounds normal. No respiratory distress. He has no wheezes.  Abdominal: Soft. Bowel sounds are normal. He exhibits no distension. There is no tenderness.  Musculoskeletal: He exhibits tenderness (left lower rib cage without no flail chest or subcutaneous air or overlying skin changes ).  Neurological: He is alert. He exhibits normal muscle tone. Coordination normal.  Skin: Skin is warm and dry. He is not diaphoretic.  Nursing note and vitals reviewed.   ED Course  Procedures (including critical care time) DIAGNOSTIC STUDIES: Oxygen Saturation is 100% on RA, nl by my interpretation.    COORDINATION OF CARE: 6:21 PM-Discussed treatment plan which includes imaging with pt at bedside and pt agreed to plan.    Labs Review Labs Reviewed - No data to display  Imaging Review Dg Ribs Unilateral W/chest Left  11/10/2014   CLINICAL DATA:  Left rib pain x2 days, status post fall  EXAM: LEFT RIBS AND CHEST - 3+ VIEW  COMPARISON:  Chest radiographs dated 09/17/2013  FINDINGS: Lungs are clear.  No pleural effusion or pneumothorax.  The heart is normal in size.  No displaced left rib fracture is seen.  IMPRESSION: No evidence of acute cardiopulmonary disease.   Electronically Signed   By: Julian Hy M.D.   On: 11/10/2014 18:01     EKG Interpretation None      MDM   Final diagnoses:  Rib pain on left side  Ecchymosis  Patient presenting after injury 2 days ago to left lower rib cage. Patient reports associated pain with deep breathing and cough. VSS. Lungs clear to auscultation bilaterally. Patient with ecchymoses to left lower quadrant that is tender to palpation with no abdominal tenderness surrounding area. He has no abdominal symptoms. Normal appetite. I doubt acute abdomen or intra-abdominal bleed. Chest x-ray without evidence of rib fracture. Patient use incentive spirometry to prevent pneumonia. Ibuprofen and Percocet for pain control. Driving and sedation precautions provided. Patient reports mild itching with hydrocodone no problem with oxycodone. He continues antihistamines for pruritus. Patient to follow-up with primary care and need to 12 hours for repeat abdominal exam. Patient nontoxic well-appearing and stable for discharge. All questions answered.   Discussed return precautions with patient. Discussed all results and patient verbalizes understanding and agrees with plan.  I personally performed the services described in this documentation, which was scribed in my presence. The recorded information has been reviewed and is accurate.    Al Corpus, PA-C 11/10/14 [place], MD 11/10/14 [phone]

## 2014-11-13 ENCOUNTER — Encounter: Payer: Self-pay | Admitting: Family

## 2014-11-13 ENCOUNTER — Ambulatory Visit (INDEPENDENT_AMBULATORY_CARE_PROVIDER_SITE_OTHER): Payer: 59 | Admitting: Family

## 2014-11-13 ENCOUNTER — Other Ambulatory Visit (INDEPENDENT_AMBULATORY_CARE_PROVIDER_SITE_OTHER): Payer: 59

## 2014-11-13 VITALS — BP 120/78 | HR 84 | Temp 98.0°F | Resp 18 | Ht 69.0 in | Wt 242.0 lb

## 2014-11-13 DIAGNOSIS — S20212D Contusion of left front wall of thorax, subsequent encounter: Secondary | ICD-10-CM

## 2014-11-13 DIAGNOSIS — S20219A Contusion of unspecified front wall of thorax, initial encounter: Secondary | ICD-10-CM | POA: Insufficient documentation

## 2014-11-13 LAB — CBC
HEMATOCRIT: 42.5 % (ref 39.0–52.0)
Hemoglobin: 14.5 g/dL (ref 13.0–17.0)
MCHC: 34.2 g/dL (ref 30.0–36.0)
MCV: 91.9 fl (ref 78.0–100.0)
PLATELETS: 212 10*3/uL (ref 150.0–400.0)
RBC: 4.63 Mil/uL (ref 4.22–5.81)
RDW: 13.1 % (ref 11.5–15.5)
WBC: 7.3 10*3/uL (ref 4.0–10.5)

## 2014-11-13 NOTE — Patient Instructions (Addendum)
Thank you for choosing [facility].  Summary/Instructions:  Please stop by the lab on the basement level of the building for your blood work. Your results will be released to [place] (or called to you) after review, usually within 72 hours after test completion. If any changes need to be made, you will be notified at that same time.  If your symptoms worsen or fail to improve, please contact our office for further instruction, or in case of emergency go directly to the emergency room at the closest medical facility.   Rib Fracture A rib fracture is a break or crack in one of the bones of the ribs. The ribs are a group of long, curved bones that wrap around your chest and attach to your spine. They protect your lungs and other organs in the chest cavity. A broken or cracked rib is often painful, but most do not cause other problems. Most rib fractures heal on their own over time. However, rib fractures can be more serious if multiple ribs are broken or if broken ribs move out of place and push against other structures. CAUSES   A direct blow to the chest. For example, this could happen during contact sports, a car accident, or a fall against a hard object.  Repetitive movements with high force, such as pitching a baseball or having severe coughing spells. SYMPTOMS   Pain when you breathe in or cough.  Pain when someone presses on the injured area. DIAGNOSIS  Your caregiver will perform a physical exam. Various imaging tests may be ordered to confirm the diagnosis and to look for related injuries. These tests may include a chest X-ray, computed tomography (CT), magnetic resonance imaging (MRI), or a bone scan. TREATMENT  Rib fractures usually heal on their own in 1-3 months. The longer healing period is often associated with a continued cough or other aggravating activities. During the healing period, pain control is very important. Medication is usually given to control pain.  Hospitalization or surgery may be needed for more severe injuries, such as those in which multiple ribs are broken or the ribs have moved out of place.  HOME CARE INSTRUCTIONS   Avoid strenuous activity and any activities or movements that cause pain. Be careful during activities and avoid bumping the injured rib.  Gradually increase activity as directed by your caregiver.  Only take over-the-counter or prescription medications as directed by your caregiver. Do not take other medications without asking your caregiver first.  Apply ice to the injured area for the first 1-2 days after you have been treated or as directed by your caregiver. Applying ice helps to reduce inflammation and pain.  Put ice in a plastic bag.  Place a towel between your skin and the bag.   Leave the ice on for 15-20 minutes at a time, every 2 hours while you are awake.  Perform deep breathing as directed by your caregiver. This will help prevent pneumonia, which is a common complication of a broken rib. Your caregiver may instruct you to:  Take deep breaths several times a day.  Try to cough several times a day, holding a pillow against the injured area.  Use a device called an incentive spirometer to practice deep breathing several times a day.  Drink enough fluids to keep your urine clear or pale yellow. This will help you avoid constipation.   Do not wear a rib belt or binder. These restrict breathing, which can lead to pneumonia.  [place]  CARE IF:   You have a fever.   You have difficulty breathing or shortness of breath.   You develop a continual cough, or you cough up thick or bloody sputum.  You feel sick to your stomach (nausea), throw up (vomit), or have abdominal pain.   You have worsening pain not controlled with medications.  MAKE SURE YOU:  Understand these instructions.  Will watch your condition.  Will get help right away if you are not doing well or get  worse. Document Released: 05/30/2005 Document Revised: 01/30/2013 Document Reviewed: 08/01/2012 [facility] Patient Information 2015 [place], [place]. This information is not intended to replace advice given to you by your health care provider. Make sure you discuss any questions you have with your health care provider.

## 2014-11-13 NOTE — Assessment & Plan Note (Signed)
Symptoms and exam consistent with rib contusion. Obtain CBC to ensure stability of hemoglobin. Instructed to keep deep breathing and using a pillow for support. Continue previously prescribed ibuprofen and Percocet as needed for pain. Follow-up if symptoms worsen or fail to improve.

## 2014-11-13 NOTE — Progress Notes (Signed)
Subjective:    Patient ID: Harry Carroll, male    DOB: Jan 21, 1961, 54 y.o.   MRN: [id number]  Chief Complaint  Patient presents with  . Hospitalization Follow-up    his rib still hurts when coughing, feels like a knife going in him, had a fall where he had an object hit him where the pain is    HPI:  Harry Carroll is a 54 y.o. male with a PMH of obstructive sleep apnea, diverticulitis, depression, bipolar disorder, hyperlipidemia, fatigue, and insomnia who presents today for an office follow-up from emergency room visit.   Recently seen in the emergency department with left-sided rib cage pain that worsened with deep inspiration and cough following a fall. He was also noted to have bruising to his left lower quadrant. He did have one episode of bloody sputum coughed up. X-rays revealed no displaced rib fractures or other abnormalities. He was discharged with ibuprofen and Percocet as needed for pain. All hospital records reviewed in detail.  Continues to experience the associated symptom of pain located on his left side that is exacerbated by coughing and sneezing. Sitting there is minimal pain. Pain is described as sharp and knife like with a severity of 10/10. Functionality is slightly decreased when coughing. Modifying factors include ibuprofen during the day which helps. He currently uses the percocet at night to assist with sleep. Continues to use the incentive spirometer which does cause him to cough.   Allergies  Allergen Reactions  . Ciprofloxacin Shortness Of Breath, Nausea And Vomiting, Nausea Only, Rash and Other (See Comments)    Marked mucous production  . Dilaudid [Hydromorphone Hcl] Shortness Of Breath and Nausea Only    Patient developed rash, excessive mucous production and chest pain.  Marland Kitchen Hydrocodone Itching     Current Outpatient Prescriptions on File Prior to Visit  Medication Sig Dispense Refill  . aspirin 81 MG tablet Take 81 mg by mouth 2 (two) times daily.      . divalproex (DEPAKOTE) 500 MG 24 hr tablet Take 1,000 mg by mouth every evening.     Marland Kitchen ibuprofen (ADVIL,MOTRIN) 200 MG tablet Take 400 mg by mouth every 6 (six) hours as needed for moderate pain (pain).    Marland Kitchen lamoTRIgine (LAMICTAL) 200 MG tablet Take 200 mg by mouth at bedtime.  2  . oxyCODONE-acetaminophen (PERCOCET/ROXICET) 5-325 MG per tablet Take 1 tablet by mouth every 6 (six) hours as needed for severe pain (pain). for pain  0  . oxyCODONE-acetaminophen (PERCOCET/ROXICET) 5-325 MG per tablet Take 1 tablet by mouth every 6 (six) hours as needed for severe pain. 15 tablet 0  . zolpidem (AMBIEN) 10 MG tablet Take 5 mg by mouth at bedtime as needed for sleep (sleep). For sleep    . traMADol (ULTRAM) 50 MG tablet Take 50 mg by mouth every 8 (eight) hours as needed for severe pain (pain). for pain  0   No current facility-administered medications on file prior to visit.    Review of Systems  Respiratory: Positive for cough. Negative for chest tightness and shortness of breath.   Cardiovascular: Positive for chest pain.  Gastrointestinal: Negative for nausea, vomiting, abdominal pain, diarrhea, constipation and abdominal distention.      Objective:    BP 120/78 mmHg  Pulse 84  Temp(Src) 98 F (36.7 C) (Oral)  Resp 18  Ht 5\' 9"  (1.753 m)  Wt 242 lb (109.77 kg)  BMI 35.72 kg/m2  SpO2 95% Nursing note and vital signs  reviewed.  Physical Exam  Constitutional: He is oriented to person, place, and time. He appears well-developed and well-nourished. No distress.  Cardiovascular: Normal rate, regular rhythm, normal heart sounds and intact distal pulses.   Pulmonary/Chest: Effort normal and breath sounds normal.  Musculoskeletal:  No obvious deformity, discoloration, or edema of left chest noted. Palpable tenderness along the lower one third of ribs. Rib compression test is positive.  Neurological: He is alert and oriented to person, place, and time.  Skin: Skin is warm and dry.   Psychiatric: He has a normal mood and affect. His behavior is normal. Judgment and thought content normal.       Assessment & Plan:   Problem List Items Addressed This Visit      Musculoskeletal and Integument   Rib contusion - Primary    Symptoms and exam consistent with rib contusion. Obtain CBC to ensure stability of hemoglobin. Instructed to keep deep breathing and using a pillow for support. Continue previously prescribed ibuprofen and Percocet as needed for pain. Follow-up if symptoms worsen or fail to improve.      Relevant Orders   CBC (Completed)

## 2014-11-13 NOTE — Progress Notes (Signed)
Pre visit review using our clinic review tool, if applicable. No additional management support is needed unless otherwise documented below in the visit note. 

## 2015-02-26 ENCOUNTER — Encounter (HOSPITAL_COMMUNITY): Payer: Self-pay | Admitting: Psychiatry

## 2015-02-26 ENCOUNTER — Ambulatory Visit (INDEPENDENT_AMBULATORY_CARE_PROVIDER_SITE_OTHER): Payer: 59 | Admitting: Psychiatry

## 2015-02-26 VITALS — BP 138/85 | HR 84 | Ht 69.0 in | Wt 235.0 lb

## 2015-02-26 DIAGNOSIS — Z79899 Other long term (current) drug therapy: Secondary | ICD-10-CM

## 2015-02-26 DIAGNOSIS — F319 Bipolar disorder, unspecified: Secondary | ICD-10-CM | POA: Diagnosis not present

## 2015-02-26 MED ORDER — LAMOTRIGINE 150 MG PO TABS: 150.0000 mg | ORAL_TABLET | Freq: Every day | ORAL | Status: DC

## 2015-02-26 NOTE — Progress Notes (Signed)
Psychiatric Initial Adult Assessment   Patient Identification: Harry Carroll MRN:  [id number] Date of Evaluation:  02/26/2015 Referral Source: Dr. Candis Schatz Chief Complaint:   Chief Complaint    Establish Care     Visit Diagnosis:    ICD-9-CM ICD-10-CM   1. Bipolar I disorder, most recent episode (or current) unspecified 296.7 F31.9 lamoTRIgine (LAMICTAL) 150 MG tablet  2. Encounter for long-term (current) use of medications V58.69 Z79.899 Valproic acid level   Diagnosis:   Patient Active Problem List   Diagnosis Date Noted  . Rib contusion [S20.219A] 11/13/2014  . Ganglion cyst of wrist [M67.439] 06/26/2014  . History of colonic polyps [Z86.010] 04/02/2014  . Diverticulitis of colon (without mention of hemorrhage) [K57.32] 01/17/2014  . Chest pain [R07.9] 09/04/2013  . Abdominal pain, left lower quadrant [R10.32] 02/18/2013  . Dizziness [R42] 03/29/2012  . Diverticulitis-post resection July 2013 [K57.92] 11/27/2011  . OSA (obstructive sleep apnea) [G47.33] 11/17/2011  . Fatigue [R53.83] 10/11/2011  . Annual physical exam [K02.54] 02/25/2011  . Personal history of colonic polyps [Z86.010] 02/18/2011  . HYPERTRIGLYCERIDEMIA [E78.1] 02/20/2009  . BIPOLAR DISORDER UNSPECIFIED [F31.9] 11/29/2007  . INSOMNIA [G47.00] 11/29/2007  . LIVER FUNCTION TESTS, ABNORMAL, HX OF [Z86.39] 11/29/2007  . DEPRESSION [F32.9] 07/07/2007  . HYPERLIPIDEMIA [E78.5] 04/16/2007   History of Present Illness:  Pt was treated by Dr. Candis Schatz for many years until his retirement. Pt is here to establish care. Pt was diagnosed with Bipolar disorder about 10 yrs ago. He was have insomnia, anxiety, sad mood, overwhelmed and it lead to a breakdown. Symptoms lasted for 1 week. Pt went to PCP who suspected Bipolar disorder. Pt was referred to Dr. Candis Schatz. Pt denies ever having a manic episode. States he has mood swings but doesn't feel they are excessive. Reports last depression episode was many years ago. No  complaints today and states mood is stready overall. Overall he feels he is doing better.  Elements:  Location:  mental. Severity:  low. Timing:  adult life. Duration:  years. Associated Signs/Symptoms: Depression Symptoms:  denies (Hypo) Manic Symptoms:  denies Anxiety Symptoms:  denies Psychotic Symptoms:  denies PTSD Symptoms: Negative  Past Medical History:  Past Medical History  Diagnosis Date  . Colon polyps     adenomatous  . Diverticulitis   . Hyperlipemia   . Insomnia   . Arthritis   . Heart murmur   . Restless leg   . Sleep apnea   . Bipolar 1 disorder     Dr Clovis Pu   . Normal cardiac stress test     CP 01-2009---Nl ECHO and stress test neg  . Carpal tunnel syndrome     saw Dr Kristie Cowman     Past Surgical History  Procedure Laterality Date  . Maxillofacial surgery    . Right shoulder surgery      X2  . Right knee arthroscopy      2007  . Shoulder surgery      LEFT  . Partial colectomy  12/20/2011    Procedure: PARTIAL COLECTOMY;  Surgeon: Earnstine Regal, MD;  Location: [facility];  Service: General;  Laterality: N/A;  Sigmoid colectomy  . Cystoscopy with urethral dilatation  12/20/2011    Procedure: CYSTOSCOPY WITH URETHRAL DILATATION;  Surgeon: Earnstine Regal, MD;  Location: [facility];  Service: General;;  with foley insertion  . Cystoscopy with urethral dilatation  12/20/2011    Procedure: CYSTOSCOPY WITH URETHRAL DILATATION;  Surgeon: Franchot Gallo, MD;  Location: [facility];  Service:  Urology;;  . Colonoscopy with propofol N/A 04/02/2014    Procedure: COLONOSCOPY WITH PROPOFOL;  Surgeon: Inda Castle, MD;  Location: [facility];  Service: Endoscopy;  Laterality: N/A;  . Hot hemostasis N/A 04/02/2014    Procedure: HOT HEMOSTASIS (ARGON PLASMA COAGULATION/BICAP);  Surgeon: Inda Castle, MD;  Location: [facility];  Service: Endoscopy;  Laterality: N/A;   Past Psych hx: Meds: Ambien for 10 yrs takes it nightly along with CPAP, has been on Depakote and Lamictal for  many years Hospitalizations: denies SIB/Suicide attempts: denies, denies family hx of suicide  Family History:  Family History  Problem Relation Age of Onset  . Prostate cancer Paternal Grandfather   . Cancer Paternal Aunt     lymph cancer  . Colon cancer Neg Hx   . CAD Neg Hx   . Pancreatic cancer Neg Hx   . Rectal cancer Neg Hx   . Stomach cancer Neg Hx   . Drug abuse Neg Hx   . Anxiety disorder Neg Hx   . Bipolar disorder Neg Hx   . Depression Neg Hx   . Alcohol abuse Father   . Alcohol abuse Sister    Social History:   Social History   Social History  . Marital Status: Married    Spouse Name: N/A  . Number of Children: 4  . 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 Smoking status: Current Some Day Smoker    Types: Cigars  . Smokeless tobacco: Current User    Types: Snuff  . Alcohol Use: Yes     Comment: one beer a few times a year  . Drug Use: No     Comment: denies THC, cocainse, stimulants, pain meds, benzos, synthetic drugs  . Sexual Activity: No   Other Topics Concern  . None   Social History Narrative   Born in [place] and grew up in [place]. He has 5 siblings. Currently resides in a house with his wife. 1 dog, 1 snake, 1 cat. 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 He has 4 kids. 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    Denies religious beliefs that would effect health care.       Additional Social History: Denies any hx of abuse.   Musculoskeletal: Strength & Muscle Tone: within normal limits Gait & Station: normal Patient leans: no leaning  Psychiatric Specialty Exam: HPI  Review of Systems  Constitutional: Negative for fever, chills and malaise/fatigue.  HENT: Negative for congestion, ear pain, nosebleeds and sore throat.   Eyes: Negative for blurred vision, double vision and pain.  Respiratory: Negative for cough, shortness of breath and wheezing.    Cardiovascular: Negative for chest pain, palpitations and leg swelling.  Gastrointestinal: Negative for heartburn, nausea, vomiting and abdominal pain.  Musculoskeletal: Negative for back pain, joint pain and neck pain.  Skin: Negative for itching and rash.  Neurological: Positive for tremors. Negative for headaches.  Psychiatric/Behavioral: Negative.  Negative for depression, suicidal ideas, hallucinations, memory loss and substance abuse. The patient is not nervous/anxious and does not have insomnia.     Blood pressure 138/85, pulse 84, height 5\' 9"  (1.753 m), weight 235 lb (106.595 kg).Body mass index is 34.69 kg/(m^2).  General Appearance: Well Groomed  Eye Contact:  Good  Speech:  Clear and Coherent and Normal Rate  Volume:  Normal  Mood:  Euthymic  Affect:  Full Range  Thought Process:  Goal Directed, Linear and Logical  Orientation:  Full (Time, Place, and Person)  Thought Content:  Negative  Suicidal Thoughts:  No  Homicidal Thoughts:  No  Memory:  Immediate;   Good Recent;   Good Remote;   Good  Judgement:  Good  Insight:  Good  Psychomotor Activity:  Normal  Concentration:  Good  Recall:  Good  Fund of Knowledge:Good  Language: Good  Akathisia:  No  Handed:  Right  AIMS (if indicated):  n/a  Assets:  Communication Skills Desire for Improvement Financial Resources/Insurance Housing Intimacy Leisure Time Resilience Social Support Talents/Skills Transportation Vocational/Educational  ADL's:  Intact  Cognition: WNL  Sleep:  fair   Is the patient at risk to self?  No. Has the patient been a risk to self in the past 6 months?  No. Has the patient been a risk to self within the distant past?  No. Is the patient a risk to others?  No. Has the patient been a risk to others in the past 6 months?  No. Has the patient been a risk to others within the distant past?  No.  Allergies:   Allergies  Allergen Reactions  . Ciprofloxacin Shortness Of Breath, Nausea And  Vomiting, Nausea Only, Rash and Other (See Comments)    Marked mucous production  . Dilaudid [Hydromorphone Hcl] Shortness Of Breath and Nausea Only    Patient developed rash, excessive mucous production and chest pain.  Marland Kitchen Hydrocodone Itching   Current Medications: Current Outpatient Prescriptions  Medication Sig Dispense Refill  . divalproex (DEPAKOTE) 500 MG 24 hr tablet Take 1,000 mg by mouth every evening.     Marland Kitchen ibuprofen (ADVIL,MOTRIN) 200 MG tablet Take 400 mg by mouth every 6 (six) hours as needed for moderate pain (pain).    Marland Kitchen lamoTRIgine (LAMICTAL) 200 MG tablet Take 200 mg by mouth at bedtime.  2  . zolpidem (AMBIEN) 10 MG tablet Take 5 mg by mouth at bedtime as needed for sleep (sleep). For sleep    . aspirin 81 MG tablet Take 81 mg by mouth 2 (two) times daily.    Marland Kitchen oxyCODONE-acetaminophen (PERCOCET/ROXICET) 5-325 MG per tablet Take 1 tablet by mouth every 6 (six) hours as needed for severe pain (pain). for pain  0  . oxyCODONE-acetaminophen (PERCOCET/ROXICET) 5-325 MG per tablet Take 1 tablet by mouth every 6 (six) hours as needed for severe pain. (Patient not taking: Reported on 02/26/2015) 15 tablet 0  . traMADol (ULTRAM) 50 MG tablet Take 50 mg by mouth every 8 (eight) hours as needed for severe pain (pain). for pain  0   No current facility-administered medications for this visit.    Previous Psychotropic Medications: see above   Substance Abuse History in the last 12 months:  No.  Consequences of Substance Abuse: Negative denies medical, legal, blackouts and withdrawls and DUI  Medical Decision Making:  Established Problem, Stable/Improving (1), Review of Psycho-Social Stressors (1), Review or order clinical lab tests (1), Review and summation of old records (2), Review of Medication Regimen & Side Effects (2) and Review of New Medication or Change in Dosage (2)  Treatment Plan Summary: Medication management and Plan see below    Plan of Care:  Medication  management with supportive therapy. Risks/benefits and SE of the medication discussed. Pt verbalized understanding and verbal consent obtained for treatment.  Affirm with the patient that the medications are taken as ordered. Patient expressed understanding of how  their medications were to be used.   Reviewed Dr. Jacky Kindle notes Question whether pt has Bipolar disorder and needs to be on 2 mood stabalizers. Plan to cut back on one slowly. Will start by tapering off Lamictal by 50mg  every month. Pt will take 150mg  for 30 days then decrease to 100mg .    Laboratory: ordered Depakote level Reviewed CBC on 11/13/2014 WNL   Psychotherapy: Therapy: brief supportive therapy provided. Discussed psychosocial stressors in detail.     Medications: Continue Depakote 1000mg  po qD for mood lability.  Continue Ambien 5mg  po qHS prn insomnia Will start by tapering off Lamictal by 50mg  every month. Pt will take 150mg  for 30 days then decrease to 100mg .   Routine PRN Medications: Yes  Consultations: None at this time  Safety Concerns: Pt denies SI and is at an acute low risk for suicide.Patient told to call clinic if any problems occur. Patient advised to go to ER if they should develop SI/HI, side effects, or if symptoms worsen. Has crisis numbers to call if needed. Pt verbalized understanding.   Other: F/up in 2 months or sooner if needed      Ishana Blades, Andria Frames 9/15/201611:33 AM

## 2015-03-04 ENCOUNTER — Other Ambulatory Visit: Payer: 59

## 2015-03-05 LAB — VALPROIC ACID LEVEL: Valproic Acid Lvl: 72 ug/mL (ref 50.0–100.0)

## 2015-03-24 ENCOUNTER — Encounter: Payer: Self-pay | Admitting: Family

## 2015-03-24 ENCOUNTER — Other Ambulatory Visit (INDEPENDENT_AMBULATORY_CARE_PROVIDER_SITE_OTHER): Payer: 59

## 2015-03-24 ENCOUNTER — Ambulatory Visit (INDEPENDENT_AMBULATORY_CARE_PROVIDER_SITE_OTHER): Payer: 59 | Admitting: Family

## 2015-03-24 VITALS — BP 110/72 | HR 85 | Temp 98.2°F | Resp 18 | Ht 69.0 in | Wt 238.0 lb

## 2015-03-24 DIAGNOSIS — Z0000 Encounter for general adult medical examination without abnormal findings: Secondary | ICD-10-CM | POA: Diagnosis not present

## 2015-03-24 DIAGNOSIS — Z0001 Encounter for general adult medical examination with abnormal findings: Secondary | ICD-10-CM | POA: Insufficient documentation

## 2015-03-24 LAB — TSH: TSH: 2.45 u[IU]/mL (ref 0.35–4.50)

## 2015-03-24 LAB — CBC
HCT: 46.5 % (ref 39.0–52.0)
Hemoglobin: 16.2 g/dL (ref 13.0–17.0)
MCHC: 34.9 g/dL (ref 30.0–36.0)
MCV: 89.3 fl (ref 78.0–100.0)
Platelets: 221 10*3/uL (ref 150.0–400.0)
RBC: 5.21 Mil/uL (ref 4.22–5.81)
RDW: 13 % (ref 11.5–15.5)
WBC: 6.2 10*3/uL (ref 4.0–10.5)

## 2015-03-24 LAB — COMPREHENSIVE METABOLIC PANEL
ALT: 24 U/L (ref 0–53)
AST: 19 U/L (ref 0–37)
Albumin: 4.6 g/dL (ref 3.5–5.2)
Alkaline Phosphatase: 35 U/L — ABNORMAL LOW (ref 39–117)
BUN: 19 mg/dL (ref 6–23)
CHLORIDE: 104 meq/L (ref 96–112)
CO2: 28 mEq/L (ref 19–32)
CREATININE: 1.08 mg/dL (ref 0.40–1.50)
Calcium: 9.8 mg/dL (ref 8.4–10.5)
GFR: 75.22 mL/min (ref >60.00)
GLUCOSE: 92 mg/dL (ref 70–99)
Potassium: 4.7 mEq/L (ref 3.5–5.1)
SODIUM: 140 meq/L (ref 135–145)
Total Bilirubin: 0.6 mg/dL (ref 0.2–1.2)
Total Protein: 7 g/dL (ref 6.0–8.3)

## 2015-03-24 LAB — LIPID PANEL
CHOLESTEROL: 255 mg/dL — AB (ref 0–200)
HDL: 43.9 mg/dL (ref >39.00)
LDL Cholesterol: 181 mg/dL — ABNORMAL HIGH (ref 0–99)
NONHDL: 211.08
Total CHOL/HDL Ratio: 6
Triglycerides: 149 mg/dL (ref 0.0–149.0)
VLDL: 29.8 mg/dL (ref 0.0–40.0)

## 2015-03-24 LAB — PSA: PSA: 1.2 ng/mL (ref 0.10–4.00)

## 2015-03-24 NOTE — Patient Instructions (Signed)
Thank you for choosing [facility].  Summary/Instructions:  Please stop by the lab on the basement level of the building for your blood work. Your results will be released to [place] (or called to you) after review, usually within 72 hours after test completion. If any changes need to be made, you will be notified at that same time.  Health Maintenance, Male A healthy lifestyle and preventative care can promote health and wellness.  Maintain regular health, dental, and eye exams.  Eat a healthy diet. Foods like vegetables, fruits, whole grains, low-fat dairy products, and lean protein foods contain the nutrients you need and are low in calories. Decrease your intake of foods high in solid fats, added sugars, and salt. Get information about a proper diet from your health care provider, if necessary.  Regular physical exercise is one of the most important things you can do for your health. Most adults should get at least 150 minutes of moderate-intensity exercise (any activity that increases your heart rate and causes you to sweat) each week. In addition, most adults need muscle-strengthening exercises on 2 or more days a week.   Maintain a healthy weight. The body mass index (BMI) is a screening tool to identify possible weight problems. It provides an estimate of body fat based on height and weight. Your health care provider can find your BMI and can help you achieve or maintain a healthy weight. For males 20 years and older:  A BMI below 18.5 is considered underweight.  A BMI of 18.5 to 24.9 is normal.  A BMI of 25 to 29.9 is considered overweight.  A BMI of 30 and above is considered obese.  Maintain normal blood lipids and cholesterol by exercising and minimizing your intake of saturated fat. Eat a balanced diet with plenty of fruits and vegetables. Blood tests for lipids and cholesterol should begin at age 87 and be repeated every 5 years. If your lipid or cholesterol levels are  high, you are over age 71, or you are at high risk for heart disease, you may need your cholesterol levels checked more frequently.Ongoing high lipid and cholesterol levels should be treated with medicines if diet and exercise are not working.  If you smoke, find out from your health care provider how to quit. If you do not use tobacco, do not start.  Lung cancer screening is recommended for adults aged 78-80 years who are at high risk for developing lung cancer because of a history of smoking. A yearly low-dose CT scan of the lungs is recommended for people who have at least a 30-pack-year history of smoking and are current smokers or have quit within the past 15 years. A pack year of smoking is smoking an average of 1 pack of cigarettes a day for 1 year (for example, a 30-pack-year history of smoking could mean smoking 1 pack a day for 30 years or 2 packs a day for 15 years). Yearly screening should continue until the smoker has stopped smoking for at least 15 years. Yearly screening should be stopped for people who develop a health problem that would prevent them from having lung cancer treatment.  If you choose to drink alcohol, do not have more than 2 drinks per day. One drink is considered to be 12 oz (360 mL) of beer, 5 oz (150 mL) of wine, or 1.5 oz (45 mL) of liquor.  Avoid the use of street drugs. Do not share needles with anyone. Ask for help if you need  support or instructions about stopping the use of drugs.  High blood pressure causes heart disease and increases the risk of stroke. High blood pressure is more likely to develop in:  People who have blood pressure in the end of the normal range (100-139/85-89 mm Hg).  People who are overweight or obese.  People who are African American.  If you are 18-39 years of age, have your blood pressure checked every 3-5 years. If you are 40 years of age or older, have your blood pressure checked every year. You should have your blood pressure  measured twice--once when you are at a hospital or clinic, and once when you are not at a hospital or clinic. Record the average of the two measurements. To check your blood pressure when you are not at a hospital or clinic, you can use:  An automated blood pressure machine at a pharmacy.  A home blood pressure monitor.  If you are 45-79 years old, ask your health care provider if you should take aspirin to prevent heart disease.  Diabetes screening involves taking a blood sample to check your fasting blood sugar level. This should be done once every 3 years after age 45 if you are at a normal weight and without risk factors for diabetes. Testing should be considered at a younger age or be carried out more frequently if you are overweight and have at least 1 risk factor for diabetes.  Colorectal cancer can be detected and often prevented. Most routine colorectal cancer screening begins at the age of 50 and continues through age 75. However, your health care provider may recommend screening at an earlier age if you have risk factors for colon cancer. On a yearly basis, your health care provider may provide home test kits to check for hidden blood in the stool. A small camera at the end of a tube may be used to directly examine the colon (sigmoidoscopy or colonoscopy) to detect the earliest forms of colorectal cancer. Talk to your health care provider about this at age 50 when routine screening begins. A direct exam of the colon should be repeated every 5-10 years through age 75, unless early forms of precancerous polyps or small growths are found.  People who are at an increased risk for hepatitis B should be screened for this virus. You are considered at high risk for hepatitis B if:  You were born in a country where hepatitis B occurs often. Talk with your health care provider about which countries are considered high risk.  Your parents were born in a high-risk country and you have not received a  shot to protect against hepatitis B (hepatitis B vaccine).  You have HIV or AIDS.  You use needles to inject street drugs.  You live with, or have sex with, someone who has hepatitis B.  You are a man who has sex with other men (MSM).  You get hemodialysis treatment.  You take certain medicines for conditions like cancer, organ transplantation, and autoimmune conditions.  Hepatitis C blood testing is recommended for all people born from 1945 through 1965 and any individual with known risk factors for hepatitis C.  Healthy men should no longer receive prostate-specific antigen (PSA) blood tests as part of routine cancer screening. Talk to your health care provider about prostate cancer screening.  Testicular cancer screening is not recommended for adolescents or adult males who have no symptoms. Screening includes self-exam, a health care provider exam, and other screening tests. Consult with your   health care provider about any symptoms you have or any concerns you have about testicular cancer.  Practice safe sex. Use condoms and avoid high-risk sexual practices to reduce the spread of sexually transmitted infections (STIs).  You should be screened for STIs, including gonorrhea and chlamydia if:  You are sexually active and are younger than 24 years.  You are older than 24 years, and your health care provider tells you that you are at risk for this type of infection.  Your sexual activity has changed since you were last screened, and you are at an increased risk for chlamydia or gonorrhea. Ask your health care provider if you are at risk.  If you are at risk of being infected with HIV, it is recommended that you take a prescription medicine daily to prevent HIV infection. This is called pre-exposure prophylaxis (PrEP). You are considered at risk if:  You are a man who has sex with other men (MSM).  You are a heterosexual man who is sexually active with multiple partners.  You take  drugs by injection.  You are sexually active with a partner who has HIV.  Talk with your health care provider about whether you are at high risk of being infected with HIV. If you choose to begin PrEP, you should first be tested for HIV. You should then be tested every 3 months for as long as you are taking PrEP.  Use sunscreen. Apply sunscreen liberally and repeatedly throughout the day. You should seek shade when your shadow is shorter than you. Protect yourself by wearing long sleeves, pants, a wide-brimmed hat, and sunglasses year round whenever you are outdoors.  Tell your health care provider of new moles or changes in moles, especially if there is a change in shape or color. Also, tell your health care provider if a mole is larger than the size of a pencil eraser.  A one-time screening for abdominal aortic aneurysm (AAA) and surgical repair of large AAAs by ultrasound is recommended for men aged 52-75 years who are current or former smokers.  Stay current with your vaccines (immunizations).   This information is not intended to replace advice given to you by your health care provider. Make sure you discuss any questions you have with your health care provider.   Document Released: 11/26/2007 Document Revised: 06/20/2014 Document Reviewed: 10/25/2010 Elsevier Interactive Patient Education 2016 [employer].  Smokeless Tobacco Use Smokeless tobacco is a loose, fine, or stringy tobacco. The tobacco is not smoked like a cigarette, but it is chewed or held in the lips or cheeks. It resembles tea and comes from the leaves of the tobacco plant. Smokeless tobacco is usually flavored, sweetened, or processed in some way. Although smokeless tobacco is not smoked into the lungs, its chemicals are absorbed through the membranes in the mouth and into the bloodstream. Its chemicals are also swallowed in saliva. The chemicals (nicotine and other toxins) are known to cause cancer. Smokeless tobacco  contains up to 28 differentcarcinogens. CAUSES Nicotine is addictive. Smokeless tobacco contains nicotine, which is a stimulant. This stimulant can give you a "buzz" or altered state. People can become addicted to the feeling it delivers.  SYMPTOMS Smokeless tobacco can cause health problems, including:  Bad breath.  Yellow-brown teeth.  Mouth sores.  Cracking and bleeding lips.  Gum disease, gum recession, and bone loss around the teeth.  Tooth decay.  Increased or irregular heart rate.  High blood pressure, heart disease, and stroke.  Cancer of the  mouth, lips, tongue, pancreas, voice box (larynx), esophagus, colon, and bladder.  Precancerous lesion of the soft tissues of the mouth (leukoplakia).  Loss of your sense of taste. TREATMENT Talk with your caregiver about ways you can quit. Quitting tobacco is a good decision for your health. Nicotine is addictive, but several options are available to help you quit including:  Nicotine replacement therapy (gum or patch).  Support and cessation programs. The following tips can help you quit:  Write down the reasons you would like to quit and look at them often.  Set a date during a low stress time to stop or cut back.  Ask family and friends for their support.  Remove all tobacco products from your home and work.  Replace the chewing tobacco with things like beef jerky, sunflower seeds, or shredded coconut.  Avoid situations that may make you want to chew tobacco.  Exercise and eat a healthy diet.  When you crave tobacco, distract yourself with drinking water, sugarless chewing gum, sugarless hard candy, exercising, or deep breathing. HOME CARE INSTRUCTIONS  See your dentist for regular oral health exams every 6 months.  Follow up with your caregiver as recommended. SEEK MEDICAL CARE OR DENTAL CARE IF:  You have bleeding or cracking lips, gums, or cheeks.  You have mouth sores, discolorations, or  pain.  You have tooth pain.  You develop persistent irritation, burning, or sores in the mouth.  You have pain, tenderness, or numbness in the mouth.  You develop a lump, bumpy patch, or hardened skin inside the mouth.  The color changes inside your mouth (gray, white, or red spots).  You have difficulty chewing, swallowing, or speaking.   This information is not intended to replace advice given to you by your health care provider. Make sure you discuss any questions you have with your health care provider.   Document Released: 11/01/2010 Document Revised: 08/22/2011 Document Reviewed: 11/01/2010 Elsevier Interactive Patient Education [employer].

## 2015-03-24 NOTE — Progress Notes (Signed)
Pre visit review using our clinic review tool, if applicable. No additional management support is needed unless otherwise documented below in the visit note. 

## 2015-03-24 NOTE — Progress Notes (Signed)
Subjective:    Patient ID: Harry Carroll, male    DOB: 1960/11/14, 54 y.o.   MRN: [id number]  Chief Complaint  Patient presents with  . CPE    fasting    HPI:  Harry Carroll is a 54 y.o. male who presents today for an annual wellness visit.   1) Health Maintenance -   Diet -  Currently eats about 5-6 small meals per day consisting of moderate size, and variety including fruits, vegetables and whole grains. 1-2 cups of caffeine per day.   Wt Readings from Last 3 Encounters:  03/24/15 238 lb (107.956 kg)  02/26/15 235 lb (106.595 kg)  11/13/14 242 lb (109.77 kg)     Exercise - Not a lot of exercise; goes to the gym occasionally and does cardio and resistance training.    2) Preventative Exams / Immunizations:  Dental -- Up to date  Vision -- Up to date   Health Maintenance  Topic Date Due  . Hepatitis C Screening  19-Jul-1960  . HIV Screening  05/24/1974  . COLONOSCOPY  04/03/2015  . INFLUENZA VACCINE  01/12/2016  . TETANUS/TDAP  01/30/2020    Immunization History  Administered Date(s) Administered  . Influenza Split 03/10/2011  . Influenza,inj,Quad PF,36+ Mos 04/13/2013  . Pneumococcal Polysaccharide-23 11/24/2011  . Td 06/14/1999, 01/29/2010    Allergies  Allergen Reactions  . Ciprofloxacin Shortness Of Breath, Nausea And Vomiting, Nausea Only, Rash and Other (See Comments)    Marked mucous production  . Dilaudid [Hydromorphone Hcl] Shortness Of Breath and Nausea Only    Patient developed rash, excessive mucous production and chest pain.  Marland Kitchen Hydrocodone Itching     Outpatient Prescriptions Prior to Visit  Medication Sig Dispense Refill  . aspirin 81 MG tablet Take 81 mg by mouth 2 (two) times daily.    . divalproex (DEPAKOTE) 500 MG 24 hr tablet Take 1,000 mg by mouth every evening.     Marland Kitchen ibuprofen (ADVIL,MOTRIN) 200 MG tablet Take 400 mg by mouth every 6 (six) hours as needed for moderate pain (pain).    Marland Kitchen lamoTRIgine (LAMICTAL) 150 MG tablet  Take 1 tablet (150 mg total) by mouth daily. 30 tablet 0  . zolpidem (AMBIEN) 10 MG tablet Take 5 mg by mouth at bedtime as needed for sleep (sleep). For sleep    . oxyCODONE-acetaminophen (PERCOCET/ROXICET) 5-325 MG per tablet Take 1 tablet by mouth every 6 (six) hours as needed for severe pain (pain). for pain  0  . oxyCODONE-acetaminophen (PERCOCET/ROXICET) 5-325 MG per tablet Take 1 tablet by mouth every 6 (six) hours as needed for severe pain. 15 tablet 0  . traMADol (ULTRAM) 50 MG tablet Take 50 mg by mouth every 8 (eight) hours as needed for severe pain (pain). for pain  0   No facility-administered medications prior to visit.     Past Medical History  Diagnosis Date  . Colon polyps     adenomatous  . Diverticulitis   . Hyperlipemia   . Insomnia   . Arthritis   . Heart murmur   . Restless leg   . Sleep apnea   . Bipolar 1 disorder (HCC)     Dr Clovis Pu   . Normal cardiac stress test     CP 01-2009---Nl ECHO and stress test neg  . Carpal tunnel syndrome     saw Dr Kristie Cowman      Past Surgical History  Procedure Laterality Date  . Maxillofacial surgery    .  Right shoulder surgery      X2  . Right knee arthroscopy      2007  . Shoulder surgery      LEFT  . Partial colectomy  12/20/2011    Procedure: PARTIAL COLECTOMY;  Surgeon: Earnstine Regal, MD;  Location: [facility];  Service: General;  Laterality: N/A;  Sigmoid colectomy  . Cystoscopy with urethral dilatation  12/20/2011    Procedure: CYSTOSCOPY WITH URETHRAL DILATATION;  Surgeon: Earnstine Regal, MD;  Location: [facility];  Service: General;;  with foley insertion  . Cystoscopy with urethral dilatation  12/20/2011    Procedure: CYSTOSCOPY WITH URETHRAL DILATATION;  Surgeon: Franchot Gallo, MD;  Location: [facility];  Service: Urology;;  . Colonoscopy with propofol N/A 04/02/2014    Procedure: COLONOSCOPY WITH PROPOFOL;  Surgeon: Inda Castle, MD;  Location: [facility];  Service: Endoscopy;  Laterality: N/A;  . Hot hemostasis N/A  04/02/2014    Procedure: HOT HEMOSTASIS (ARGON PLASMA COAGULATION/BICAP);  Surgeon: Inda Castle, MD;  Location: [facility];  Service: Endoscopy;  Laterality: N/A;     Family History  Problem Relation Age of Onset  . Prostate cancer Paternal Grandfather   . Cancer Paternal Aunt     lymph cancer  . Colon cancer Neg Hx   . CAD Neg Hx   . Pancreatic cancer Neg Hx   . Rectal cancer Neg Hx   . Stomach cancer Neg Hx   . Drug abuse Neg Hx   . Anxiety disorder Neg Hx   . Bipolar disorder Neg Hx   . Depression Neg Hx   . Alcohol abuse Father   . Alcohol abuse Sister      Social History   Social History  . Marital Status: Married    Spouse Name: N/A  . Number of Children: 4  . 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 Smoking status: Current Some Day Smoker    Types: Cigars  . Smokeless tobacco: Current User    Types: Snuff  . Alcohol Use: Yes     Comment: one beer a few times a year  . Drug Use: No     Comment: denies THC, cocainse, stimulants, pain meds, benzos, synthetic drugs  . Sexual Activity: No   Other Topics Concern  . Not on file   Social History Narrative   Born in [place] and grew up in [place]. Currently resides in a house with his wife. 1 dog, 1 snake, 1 cat. 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    Denies religious beliefs that would effect health care.        Review of Systems  Constitutional: Denies fever, chills, fatigue, or significant weight gain/loss. HENT: Head: Denies headache or neck pain Ears: Denies changes in hearing, ringing in ears, earache, drainage Nose: Denies discharge, stuffiness, itching, nosebleed, sinus pain Throat: Denies sore throat, hoarseness, dry mouth, sores, thrush Eyes: Denies loss/changes in vision, pain, redness, blurry/double vision, flashing lights Cardiovascular: Denies chest pain/discomfort, tightness, palpitations, shortness of breath  with activity, difficulty lying down, swelling, sudden awakening with shortness of breath Respiratory: Denies shortness of breath, cough, sputum production, wheezing Gastrointestinal: Denies dysphasia, heartburn, change in appetite, nausea, change in bowel habits, rectal bleeding, constipation, diarrhea, yellow skin or eyes Genitourinary: Denies frequency, urgency, burning/pain, blood in urine, incontinence, change in urinary strength. Musculoskeletal: Denies muscle/joint pain, stiffness, back pain, redness or swelling  of joints, trauma Skin: Denies rashes, lumps, itching, dryness, color changes, or hair/nail changes Neurological: Denies dizziness, fainting, seizures, weakness, numbness, tingling, tremor Psychiatric - Denies nervousness, stress, depression or memory loss Endocrine: Denies heat or cold intolerance, sweating, frequent urination, excessive thirst, changes in appetite Hematologic: Denies ease of bruising or bleeding     Objective:    BP 110/72 mmHg  Pulse 85  Temp(Src) 98.2 F (36.8 C) (Oral)  Resp 18  Ht 5\' 9"  (1.753 m)  Wt 238 lb (107.956 kg)  BMI 35.13 kg/m2  SpO2 97% Nursing note and vital signs reviewed.  Physical Exam  Constitutional: He is oriented to person, place, and time. He appears well-developed and well-nourished.  HENT:  Head: Normocephalic.  Right Ear: Hearing, tympanic membrane, external ear and ear canal normal.  Left Ear: Hearing, tympanic membrane, external ear and ear canal normal.  Nose: Nose normal.  Mouth/Throat: Uvula is midline, oropharynx is clear and moist and mucous membranes are normal.  Eyes: Conjunctivae and EOM are normal. Pupils are equal, round, and reactive to light.  Neck: Neck supple. No JVD present. No tracheal deviation present. No thyromegaly present.  Cardiovascular: Normal rate, regular rhythm, normal heart sounds and intact distal pulses.   Pulmonary/Chest: Effort normal and breath sounds normal.  Abdominal: Soft. Bowel  sounds are normal. He exhibits no distension and no mass. There is no tenderness. There is no rebound and no guarding.  Musculoskeletal: Normal range of motion. He exhibits no edema or tenderness.  Lymphadenopathy:    He has no cervical adenopathy.  Neurological: He is alert and oriented to person, place, and time. He has normal reflexes. No cranial nerve deficit. He exhibits normal muscle tone. Coordination normal.  Skin: Skin is warm and dry.  Psychiatric: He has a normal mood and affect. His behavior is normal. Judgment and thought content normal.       Assessment & Plan:   Problem List Items Addressed This Visit      Other   Routine general medical examination at a health care facility - Primary    1) Anticipatory Guidance: Discussed importance of wearing a seatbelt while driving and not texting while driving; changing batteries in smoke detector at least once annually; wearing suntan lotion when outside; eating a balanced and moderate diet; getting physical activity at least 30 minutes per day.  2) Immunizations / Screenings / Labs:  All immunizations are up to date per recommendations. Obtain PSA for prostate screen. All screeenings are up to date per recommendations. Obtain CBC, CMET, Lipid profile and TSH.   Overall well exam with risk factors for cardiovascular and chronic diseases including smokeless tobacco use and limited physical activity. Discussed risks of continued smokeless tobacco use and resources that can be used to assist with cessation. Will contemplate. Goal to increase physical activity to 30 minutes most days of the week of moderate intensity physical activity and goal weight loss of about 5-10% of current weight. Continue other healthy lifestyle behaviors. Follow up prevention exam in 1 year and follow up office visit pending lab work.          Relevant Orders   CBC (Completed)   Comprehensive metabolic panel (Completed)   Lipid panel (Completed)   TSH  (Completed)   PSA (Completed)

## 2015-03-24 NOTE — Assessment & Plan Note (Addendum)
1) Anticipatory Guidance: Discussed importance of wearing a seatbelt while driving and not texting while driving; changing batteries in smoke detector at least once annually; wearing suntan lotion when outside; eating a balanced and moderate diet; getting physical activity at least 30 minutes per day.  2) Immunizations / Screenings / Labs:  All immunizations are up to date per recommendations. Obtain PSA for prostate screen. All screeenings are up to date per recommendations. Obtain CBC, CMET, Lipid profile and TSH.   Overall well exam with risk factors for cardiovascular and chronic diseases including smokeless tobacco use and limited physical activity. Discussed risks of continued smokeless tobacco use and resources that can be used to assist with cessation. Will contemplate. Goal to increase physical activity to 30 minutes most days of the week of moderate intensity physical activity and goal weight loss of about 5-10% of current weight. Continue other healthy lifestyle behaviors. Follow up prevention exam in 1 year and follow up office visit pending lab work.

## 2015-03-26 ENCOUNTER — Other Ambulatory Visit (HOSPITAL_COMMUNITY): Payer: Self-pay | Admitting: Psychiatry

## 2015-03-26 MED ORDER — PRAVASTATIN SODIUM 20 MG PO TABS: 40.0000 mg | ORAL_TABLET | Freq: Every day | ORAL | Status: DC

## 2015-04-23 ENCOUNTER — Other Ambulatory Visit (HOSPITAL_COMMUNITY): Payer: Self-pay | Admitting: Psychiatry

## 2015-04-28 ENCOUNTER — Ambulatory Visit (INDEPENDENT_AMBULATORY_CARE_PROVIDER_SITE_OTHER): Payer: 59 | Admitting: Psychiatry

## 2015-04-28 ENCOUNTER — Encounter (HOSPITAL_COMMUNITY): Payer: Self-pay | Admitting: Psychiatry

## 2015-04-28 VITALS — BP 118/82 | HR 87 | Resp 12 | Wt 237.8 lb

## 2015-04-28 DIAGNOSIS — F319 Bipolar disorder, unspecified: Secondary | ICD-10-CM

## 2015-04-28 MED ORDER — LAMOTRIGINE 25 MG PO TABS: ORAL_TABLET | ORAL | Status: DC

## 2015-04-28 MED ORDER — DIVALPROEX SODIUM ER 500 MG PO TB24: 1000.0000 mg | ORAL_TABLET | Freq: Every evening | ORAL | Status: DC

## 2015-04-28 MED ORDER — ZOLPIDEM TARTRATE 10 MG PO TABS: 5.0000 mg | ORAL_TABLET | Freq: Every day | ORAL | Status: DC

## 2015-04-28 NOTE — Progress Notes (Signed)
[facility] MD/PA/NP OP Progress Note  04/28/2015 1:15 PM Harry Carroll  MRN:  [id number]  Subjective:   Pt states he is doing well. He is now on Lamictal 1000mg  and is tolerating the decrease well. Hand tremor and tinnitus have resolved. He has more energy and is feeling better. Pt has been more productive and goal oriented.  Work and home life are good. He has been getting a little harrassment due to this name but is handling it well.   Pt denies depression. Denies anhedonia, isolation, crying spells, low motivation, poor hygiene, worthlessness and hopelessness. Denies SI/HI. Denies AVH.   Sleep is "comfortable" and he is taking Ambien on week days. He sleeps well on weekends without Ambien.  Appetite is good and he has lost some weight due to dieting.  Energy is improving.  Concentration is good.  Denies manic and hypomanic symptoms including periods of decreased need for sleep, increased energy, mood lability, impulsivity, FOI, and excessive spending.  Taking meds as prescribed and denies SE.    Chief Complaint: "just fine" Chief Complaint    Follow-up     Visit Diagnosis:     ICD-9-CM ICD-10-CM   1. Bipolar I disorder (HCC) 296.7 F31.9 zolpidem (AMBIEN) 10 MG tablet     lamoTRIgine (LAMICTAL) 25 MG tablet     divalproex (DEPAKOTE ER) 500 MG 24 hr tablet    Past Medical History:  Past Medical History  Diagnosis Date  . Colon polyps     adenomatous  . Diverticulitis   . Hyperlipemia   . Insomnia   . Arthritis   . Heart murmur   . Restless leg   . Sleep apnea   . Bipolar 1 disorder (HCC)     Dr Clovis Pu   . Normal cardiac stress test     CP 01-2009---Nl ECHO and stress test neg  . Carpal tunnel syndrome     saw Dr Kristie Cowman     Past Surgical History  Procedure Laterality Date  . Maxillofacial surgery    . Right shoulder surgery      X2  . Right knee arthroscopy      2007  . Shoulder surgery      LEFT  . Partial colectomy  12/20/2011    Procedure: PARTIAL COLECTOMY;   Surgeon: Earnstine Regal, MD;  Location: [facility];  Service: General;  Laterality: N/A;  Sigmoid colectomy  . Cystoscopy with urethral dilatation  12/20/2011    Procedure: CYSTOSCOPY WITH URETHRAL DILATATION;  Surgeon: Earnstine Regal, MD;  Location: [facility];  Service: General;;  with foley insertion  . Cystoscopy with urethral dilatation  12/20/2011    Procedure: CYSTOSCOPY WITH URETHRAL DILATATION;  Surgeon: Franchot Gallo, MD;  Location: [facility];  Service: Urology;;  . Colonoscopy with propofol N/A 04/02/2014    Procedure: COLONOSCOPY WITH PROPOFOL;  Surgeon: Inda Castle, MD;  Location: [facility];  Service: Endoscopy;  Laterality: N/A;  . Hot hemostasis N/A 04/02/2014    Procedure: HOT HEMOSTASIS (ARGON PLASMA COAGULATION/BICAP);  Surgeon: Inda Castle, MD;  Location: [facility];  Service: Endoscopy;  Laterality: N/A;   Past Psych hx: Meds: Ambien for 10 yrs takes it nightly along with CPAP, has been on Depakote and Lamictal for many years Hospitalizations: denies SIB/Suicide attempts: denies, denies family hx of suicide Denies hx of abuse.   Family History:  Family History  Problem Relation Age of Onset  . Prostate cancer Paternal Grandfather   . Cancer Paternal Aunt  lymph cancer  . Colon cancer Neg Hx   . CAD Neg Hx   . Pancreatic cancer Neg Hx   . Rectal cancer Neg Hx   . Stomach cancer Neg Hx   . Drug abuse Neg Hx   . Anxiety disorder Neg Hx   . Bipolar disorder Neg Hx   . Depression Neg Hx   . Alcohol abuse Father   . Alcohol abuse Sister    Social History:  Social History   Social History  . Marital Status: Married    Spouse Name: N/A  . Number of Children: 4  . 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 Smoking status: Current Some Day Smoker    Types: Cigars  . Smokeless tobacco: Current User    Types: Snuff  . Alcohol Use: Yes     Comment: one beer a few times a year  . Drug  Use: No     Comment: denies THC, cocainse, stimulants, pain meds, benzos, synthetic drugs  . Sexual Activity: No   Other Topics Concern  . None   Social History Narrative   Born in [place] and grew up in [place]. Currently resides in a house with his wife. 1 dog, 1 snake, 1 cat. 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    Denies religious beliefs that would effect health care.       Additional History: n/a   Musculoskeletal: Strength & Muscle Tone: within normal limits Gait & Station: normal Patient leans: N/A  Psychiatric Specialty Exam: HPI  Review of Systems  Constitutional: Negative for fever and chills.  HENT: Negative for nosebleeds, sore throat and tinnitus.   Eyes: Negative for blurred vision, double vision and pain.  Respiratory: Negative for cough, shortness of breath and wheezing.   Cardiovascular: Negative for chest pain, palpitations and leg swelling.  Gastrointestinal: Negative for heartburn, nausea, vomiting and abdominal pain.  Musculoskeletal: Negative for back pain, joint pain and neck pain.  Skin: Negative for itching and rash.  Neurological: Negative for dizziness, tremors, seizures, loss of consciousness, weakness and headaches.  Psychiatric/Behavioral: Negative for depression, suicidal ideas, hallucinations and substance abuse. The patient is not nervous/anxious and does not have insomnia.     Blood pressure 118/82, pulse 87, resp. rate 12, weight 237 lb 12.8 oz (107.865 kg).Body mass index is 35.1 kg/(m^2).  General Appearance: Fairly Groomed  Eye Contact:  Good  Speech:  Clear and Coherent and Normal Rate  Volume:  Normal  Mood:  Euthymic  Affect:  Full Range  Thought Process:  Goal Directed  Orientation:  Full (Time, Place, and Person)  Thought Content:  Negative  Suicidal Thoughts:  No  Homicidal Thoughts:  No  Memory:  Immediate;   Good Recent;   Good Remote;   Good  Judgement:  Good  Insight:  Good  Psychomotor Activity:  Normal   Concentration:  Good  Recall:  Good  Fund of Knowledge: Good  Language: Good  Akathisia:  No  Handed:  Right  AIMS (if indicated):  n/a  Assets:  Communication Skills Desire for Improvement Financial Resources/Insurance Housing Intimacy Leisure Time Physical Health Resilience Social Support Talents/Skills Transportation Vocational/Educational  ADL's:  Intact  Cognition: WNL  Sleep:  good   Is the patient at risk to self?  No. Has the patient been a risk to self in the past 6 months?  No. Has the patient  been a risk to self within the distant past?  No. Is the patient a risk to others?  No. Has the patient been a risk to others in the past 6 months?  No. Has the patient been a risk to others within the distant past?  No.  Current Medications: Current Outpatient Prescriptions  Medication Sig Dispense Refill  . divalproex (DEPAKOTE) 500 MG 24 hr tablet Take 1,000 mg by mouth every evening.     . lamoTRIgine (LAMICTAL) 150 MG tablet TAKE 1 TABLET (150 MG TOTAL) BY MOUTH DAILY. 30 tablet 0  . pravastatin (PRAVACHOL) 20 MG tablet Take 2 tablets (40 mg total) by mouth daily. 30 tablet 1  . zolpidem (AMBIEN) 10 MG tablet TAKE 1/2 TO 1 TABLET BY MOUTH AT BEDTIME 10 tablet 0  . aspirin 81 MG tablet Take 81 mg by mouth 2 (two) times daily.    Marland Kitchen ibuprofen (ADVIL,MOTRIN) 200 MG tablet Take 400 mg by mouth every 6 (six) hours as needed for moderate pain (pain).     No current facility-administered medications for this visit.    Medical Decision Making:  Established Problem, Stable/Improving (1), Review of Psycho-Social Stressors (1), Review or order clinical lab tests (1), Review of Medication Regimen & Side Effects (2) and Review of New Medication or Change in Dosage (2)     Assessment: Bipolar I  Treatment Plan Summary:Medication management and Plan see below Plan of Care: Medication management with supportive therapy. Risks/benefits and SE of the medication discussed. Pt  verbalized understanding and verbal consent obtained for treatment. Affirm with the patient that the medications are taken as ordered. Patient expressed understanding of how their medications were to be used.   Reviewed Dr. Jacky Kindle notes Question whether pt has Bipolar disorder and needs to be on 2 mood stabalizers. Plan to cut back on one slowly. Will start by tapering off Lamictal by 50mg  every month. Pt will take 150mg  for 30 days then decrease to 100mg .    Laboratory: reviewed Depakote level 72, chol 255, LDL 181, CBC WNL, TSH WNL    Psychotherapy: Therapy: brief supportive therapy provided. Discussed psychosocial stressors in detail.    Medications: Continue Depakote 1000mg  po qD for mood lability.  Continue Ambien 5mg  po qHS prn insomnia Will start by tapering off Lamictal by 50mg  every month. Pt will take 100mg  for 30 days then decrease to 50mg  then 25mg .   Routine PRN Medications: Yes  Consultations: None at this time  Safety Concerns: Pt denies SI and is at an acute low risk for suicide.Patient told to call clinic if any problems occur. Patient advised to go to ER if they should develop SI/HI, side effects, or if symptoms worsen. Has crisis numbers to call if needed. Pt verbalized understanding.   Other: F/up in 2 months or sooner if needed              Adlynn Lowenstein 04/28/2015, 1:15 PM

## 2015-05-27 ENCOUNTER — Other Ambulatory Visit: Payer: Self-pay | Admitting: Family

## 2015-07-16 ENCOUNTER — Ambulatory Visit (HOSPITAL_COMMUNITY): Payer: Self-pay | Admitting: Psychiatry

## 2015-08-18 ENCOUNTER — Ambulatory Visit (INDEPENDENT_AMBULATORY_CARE_PROVIDER_SITE_OTHER): Payer: 59 | Admitting: Psychiatry

## 2015-08-18 ENCOUNTER — Encounter (HOSPITAL_COMMUNITY): Payer: Self-pay | Admitting: Psychiatry

## 2015-08-18 VITALS — BP 130/82 | HR 83 | Ht 69.0 in | Wt 244.0 lb

## 2015-08-18 DIAGNOSIS — F319 Bipolar disorder, unspecified: Secondary | ICD-10-CM | POA: Diagnosis not present

## 2015-08-18 MED ORDER — DIVALPROEX SODIUM ER 250 MG PO TB24: 250.0000 mg | ORAL_TABLET | Freq: Every evening | ORAL | Status: DC

## 2015-08-18 NOTE — Progress Notes (Signed)
Patient ID: Harry Carroll, male   DOB: 1961/06/06, 55 y.o.   MRN: [id number] [facility] MD/PA/NP OP Progress Note  08/18/2015 11:28 AM EDDI TATAR  MRN:  [id number]  Subjective:   Pt states he is doing well. He finished Lamictal 25mg  2 weeks and denies rash and other SE. Pt also decreased his Depakote to 250mg  qD. He started tapering himself down about 2-3 months ago.  Hand tremor and tinnitus have resolved. He has more energy and is feeling better. Pt has been more productive and goal oriented.  Work and home life are good. No concerns or compliants today.   Pt denies depression. Denies anhedonia, isolation, crying spells, low motivation, poor hygiene, worthlessness and hopelessness. Denies SI/HI. Denies AVH.   Sleep is good and he is no longer taking Ambien for last 3 months. Appetite is good and he has lost some weight due to dieting. Concentration is good.  Denies manic and hypomanic symptoms including periods of decreased need for sleep, increased energy, mood lability, impulsivity, FOI, and excessive spending.  Taking meds as prescribed and denies SE.    Chief Complaint: "really good" Chief Complaint    Follow-up     Visit Diagnosis:     ICD-9-CM ICD-10-CM   1. Bipolar I disorder ([place]) 296.7 F31.9     Past Medical History:  Past Medical History  Diagnosis Date  . Colon polyps     adenomatous  . Diverticulitis   . Hyperlipemia   . Insomnia   . Arthritis   . Heart murmur   . Restless leg   . Sleep apnea   . Bipolar 1 disorder (HCC)     Dr Clovis Pu   . Normal cardiac stress test     CP 01-2009---Nl ECHO and stress test neg  . Carpal tunnel syndrome     saw Dr Kristie Cowman     Past Surgical History  Procedure Laterality Date  . Maxillofacial surgery    . Right shoulder surgery      X2  . Right knee arthroscopy      2007  . Shoulder surgery      LEFT  . Partial colectomy  12/20/2011    Procedure: PARTIAL COLECTOMY;  Surgeon: Earnstine Regal, MD;  Location: [facility];  Service:  General;  Laterality: N/A;  Sigmoid colectomy  . Cystoscopy with urethral dilatation  12/20/2011    Procedure: CYSTOSCOPY WITH URETHRAL DILATATION;  Surgeon: Earnstine Regal, MD;  Location: [facility];  Service: General;;  with foley insertion  . Cystoscopy with urethral dilatation  12/20/2011    Procedure: CYSTOSCOPY WITH URETHRAL DILATATION;  Surgeon: Franchot Gallo, MD;  Location: [facility];  Service: Urology;;  . Colonoscopy with propofol N/A 04/02/2014    Procedure: COLONOSCOPY WITH PROPOFOL;  Surgeon: Inda Castle, MD;  Location: [facility];  Service: Endoscopy;  Laterality: N/A;  . Hot hemostasis N/A 04/02/2014    Procedure: HOT HEMOSTASIS (ARGON PLASMA COAGULATION/BICAP);  Surgeon: Inda Castle, MD;  Location: [facility];  Service: Endoscopy;  Laterality: N/A;   Past Psych hx: Meds: Ambien for 10 yrs takes it nightly along with CPAP, has been on Depakote and Lamictal for many years Hospitalizations: denies SIB/Suicide attempts: denies, denies family hx of suicide Denies hx of abuse.   Family History:  Family History  Problem Relation Age of Onset  . Prostate cancer Paternal Grandfather   . Cancer Paternal Aunt     lymph cancer  . Colon cancer Neg Hx   .  CAD Neg Hx   . Pancreatic cancer Neg Hx   . Rectal cancer Neg Hx   . Stomach cancer Neg Hx   . Drug abuse Neg Hx   . Anxiety disorder Neg Hx   . Bipolar disorder Neg Hx   . Depression Neg Hx   . Alcohol abuse Father   . Alcohol abuse Sister    Social History:  Social History   Social History  . Marital Status: Married    Spouse Name: N/A  . Number of Children: 4  . 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 Smoking status: Current Some Day Smoker    Types: Cigars  . Smokeless tobacco: Current User    Types: Snuff  . Alcohol Use: Yes     Comment: one beer a few times a year  . Drug Use: No     Comment: denies THC, cocainse, stimulants,  pain meds, benzos, synthetic drugs  . Sexual Activity: No   Other Topics Concern  . None   Social History Narrative   Born in [place] and grew up in [place]. Currently resides in a house with his wife. 1 dog, 1 snake, 1 cat. 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    Denies religious beliefs that would effect health care.        Musculoskeletal: Strength & Muscle Tone: within normal limits Gait & Station: normal Patient leans: straight  Psychiatric Specialty Exam: HPI  Review of Systems  Constitutional: Negative for fever and chills.  HENT: Negative for nosebleeds, sore throat and tinnitus.   Eyes: Negative for blurred vision, double vision and pain.  Respiratory: Negative for cough, shortness of breath and wheezing.   Cardiovascular: Negative for chest pain, palpitations and leg swelling.  Gastrointestinal: Negative for heartburn, nausea, vomiting and abdominal pain.  Musculoskeletal: Negative for back pain, joint pain, falls and neck pain.  Skin: Negative for itching and rash.  Neurological: Negative for dizziness, tremors, seizures, loss of consciousness, weakness and headaches.  Psychiatric/Behavioral: Negative for depression, suicidal ideas, hallucinations and substance abuse. The patient is not nervous/anxious and does not have insomnia.     Blood pressure 130/82, pulse 83, height 5\' 9"  (1.753 m), weight 244 lb (110.678 kg).Body mass index is 36.02 kg/(m^2).  General Appearance: Fairly Groomed  Eye Contact:  Good  Speech:  Clear and Coherent and Normal Rate  Volume:  Normal  Mood:  Euthymic  Affect:  Full Range  Thought Process:  Goal Directed  Orientation:  Full (Time, Place, and Person)  Thought Content:  Negative  Suicidal Thoughts:  No  Homicidal Thoughts:  No  Memory:  Immediate;   Good Recent;   Good Remote;   Good  Judgement:  Good  Insight:  Good  Psychomotor Activity:  Normal  Concentration:  Good  Recall:  Good  Fund of Knowledge: Good  Language:  Good  Akathisia:  No  Handed:  Right  AIMS (if indicated):  n/a  Assets:  Communication Skills Desire for Improvement Financial Resources/Insurance Housing Intimacy Leisure Time Physical Health Resilience Social Support Talents/Skills Transportation Vocational/Educational  ADL's:  Intact  Cognition: WNL  Sleep:  good   Is the patient at risk to self?  No. Has the patient been a risk to self in the past 6 months?  No. Has the patient been a risk to self within the distant past?  No. Is the patient  a risk to others?  No. Has the patient been a risk to others in the past 6 months?  No. Has the patient been a risk to others within the distant past?  No.  Current Medications: Current Outpatient Prescriptions  Medication Sig Dispense Refill  . aspirin 81 MG tablet Take 81 mg by mouth 2 (two) times daily.    . divalproex (DEPAKOTE ER) 500 MG 24 hr tablet Take 2 tablets (1,000 mg total) by mouth every evening. (Patient taking differently: Take 250 mg by mouth every evening. ) 60 tablet 1  . ibuprofen (ADVIL,MOTRIN) 200 MG tablet Take 400 mg by mouth every 6 (six) hours as needed for moderate pain (pain).    . pravastatin (PRAVACHOL) 20 MG tablet TAKE 2 TABLETS (40 MG TOTAL) BY MOUTH DAILY. 60 tablet 11  . lamoTRIgine (LAMICTAL) 25 MG tablet Take 50mg  po qD for 30 days then decrease to 25mg  for 30 days then stop (Patient not taking: Reported on 08/18/2015) 90 tablet 0  . zolpidem (AMBIEN) 10 MG tablet Take 0.5-1 tablets (5-10 mg total) by mouth at bedtime. (Patient not taking: Reported on 08/18/2015) 30 tablet 1   No current facility-administered medications for this visit.    Medical Decision Making:  Established Problem, Stable/Improving (1), Review of Psycho-Social Stressors (1), Review of Medication Regimen & Side Effects (2) and Review of New Medication or Change in Dosage (2)     Assessment: Bipolar I  Treatment Plan Summary:Medication management and Plan see below Plan of  Care: Medication management with supportive therapy. Risks/benefits and SE of the medication discussed. Pt verbalized understanding and verbal consent obtained for treatment. Affirm with the patient that the medications are taken as ordered. Patient expressed understanding of how their medications were to be used.   Reviewed Dr. Jacky Kindle notes Question whether pt has Bipolar disorder and needs to be on 2 mood stabalizers. Plan to cut back on one slowly. Will start by tapering off Lamictal by 50mg  every month. Pt will take 150mg  for 30 days then decrease to 100mg .    Laboratory: reviewed Depakote level 72, chol 255, LDL 181, CBC WNL, TSH WNL    Psychotherapy: Therapy: brief supportive therapy provided. Discussed psychosocial stressors in detail.    Medications: Continue Depakote at 250mg  po qD for mood lability.  D/c Ambien  D/c Lamictal .  Pt is aware that symptoms could return and is monitoring his symptoms  Routine PRN Medications: Yes  Consultations: None at this time  Safety Concerns: Pt denies SI and is at an acute low risk for suicide.Patient told to call clinic if any problems occur. Patient advised to go to ER if they should develop SI/HI, side effects, or if symptoms worsen. Has crisis numbers to call if needed. Pt verbalized understanding.   Other: F/up in 3 months or sooner if needed              Merideth Bosque, [place] 08/18/2015, 11:28 AM

## 2015-08-24 ENCOUNTER — Telehealth: Payer: Self-pay | Admitting: Family

## 2015-08-24 NOTE — Telephone Encounter (Signed)
[facility]  Patient Name: Harry Carroll  DOB: 1960-08-06    Initial Comment Caller states his arms are going numb, fingers are tingly, and a lot of pain in his hands and arms.    Nurse Assessment  Nurse: Wayne Sever, RN, Tillie Rung Date/Time (Eastern Time): 08/24/2015 12:47:57 PM  Confirm and document reason for call. If symptomatic, describe symptoms. You must click the next button to save text entered. ---Caller states for the last few weeks he is getting worse. Both his arms are going numb at night per caller and it's painful and wakes him up. He states it takes a few hours for the feeling to come back in his hands.  Has the patient traveled out of the country within the last 30 days? ---Not Applicable  Does the patient have any new or worsening symptoms? ---Yes  Will a triage be completed? ---Yes  Related visit to physician within the last 2 weeks? ---No  Does the PT have any chronic conditions? (i.e. diabetes, asthma, etc.) ---No  Is this a behavioral health or substance abuse call? ---No     Guidelines    Guideline Title Affirmed Question Affirmed Notes  Neurologic Deficit [1] Numbness or tingling in one or both hands AND [2] is a chronic symptom (recurrent or ongoing AND present > 4 weeks)    Final Disposition User   See PCP When Office is Open (within 3 days) Wayne Sever, RN, Tillie Rung    Comments  Caller scheduled with Dr Elna Breslow at 1000am on 03/16   Referrals  REFERRED TO PCP OFFICE   Disagree/Comply: Comply

## 2015-08-27 ENCOUNTER — Encounter: Payer: Self-pay | Admitting: Family

## 2015-08-27 ENCOUNTER — Ambulatory Visit (INDEPENDENT_AMBULATORY_CARE_PROVIDER_SITE_OTHER): Payer: 59 | Admitting: Family

## 2015-08-27 VITALS — BP 138/86 | HR 83 | Temp 98.6°F | Resp 16 | Ht 69.0 in | Wt 245.0 lb

## 2015-08-27 DIAGNOSIS — R200 Anesthesia of skin: Secondary | ICD-10-CM | POA: Diagnosis not present

## 2015-08-27 DIAGNOSIS — R202 Paresthesia of skin: Secondary | ICD-10-CM

## 2015-08-27 NOTE — Patient Instructions (Addendum)
Thank you for choosing [facility].  Summary/Instructions:  Please take ibuprofen at night and during the day as needed.. Neck stretches daily. Consider a cock-up wrist splint at night. May also consider Tumeric 500 mg.   If your symptoms worsen or fail to improve, please contact our office for further instruction, or in case of emergency go directly to the emergency room at the closest medical facility.   Cervical Strain and Sprain With Rehab Cervical strain and sprain are injuries that commonly occur with "whiplash" injuries. Whiplash occurs when the neck is forcefully whipped backward or forward, such as during a motor vehicle accident or during contact sports. The muscles, ligaments, tendons, discs, and nerves of the neck are susceptible to injury when this occurs. RISK FACTORS Risk of having a whiplash injury increases if:  Osteoarthritis of the spine.  Situations that make head or neck accidents or trauma more likely.  High-risk sports (football, rugby, wrestling, hockey, auto racing, gymnastics, diving, contact karate, or boxing).  Poor strength and flexibility of the neck.  Previous neck injury.  Poor tackling technique.  Improperly fitted or padded equipment. SYMPTOMS   Pain or stiffness in the front or back of neck or both.  Symptoms may present immediately or up to 24 hours after injury.  Dizziness, headache, nausea, and vomiting.  Muscle spasm with soreness and stiffness in the neck.  Tenderness and swelling at the injury site. PREVENTION  Learn and use proper technique (avoid tackling with the head, spearing, and head-butting; use proper falling techniques to avoid landing on the head).  Warm up and stretch properly before activity.  Maintain physical fitness:  Strength, flexibility, and endurance.  Cardiovascular fitness.  Wear properly fitted and padded protective equipment, such as padded soft collars, for participation in contact  sports. PROGNOSIS  Recovery from cervical strain and sprain injuries is dependent on the extent of the injury. These injuries are usually curable in 1 week to 3 months with appropriate treatment.  RELATED COMPLICATIONS   Temporary numbness and weakness may occur if the nerve roots are damaged, and this may persist until the nerve has completely healed.  Chronic pain due to frequent recurrence of symptoms.  Prolonged healing, especially if activity is resumed too soon (before complete recovery). TREATMENT  Treatment initially involves the use of ice and medication to help reduce pain and inflammation. It is also important to perform strengthening and stretching exercises and modify activities that worsen symptoms so the injury does not get worse. These exercises may be performed at home or with a therapist. For patients who experience severe symptoms, a soft, padded collar may be recommended to be worn around the neck.  Improving your posture may help reduce symptoms. Posture improvement includes pulling your chin and abdomen in while sitting or standing. If you are sitting, sit in a firm chair with your buttocks against the back of the chair. While sleeping, try replacing your pillow with a small towel rolled to 2 inches in diameter, or use a cervical pillow or soft cervical collar. Poor sleeping positions delay healing.  For patients with nerve root damage, which causes numbness or weakness, the use of a cervical traction apparatus may be recommended. Surgery is rarely necessary for these injuries. However, cervical strain and sprains that are present at birth (congenital) may require surgery. MEDICATION   If pain medication is necessary, nonsteroidal anti-inflammatory medications, such as aspirin and ibuprofen, or other minor pain relievers, such as acetaminophen, are often recommended.  Do not take  pain medication for 7 days before surgery.  Prescription pain relievers may be given if deemed  necessary by your caregiver. Use only as directed and only as much as you need. HEAT AND COLD:   Cold treatment (icing) relieves pain and reduces inflammation. Cold treatment should be applied for 10 to 15 minutes every 2 to 3 hours for inflammation and pain and immediately after any activity that aggravates your symptoms. Use ice packs or an ice massage.  Heat treatment may be used prior to performing the stretching and strengthening activities prescribed by your caregiver, physical therapist, or athletic trainer. Use a heat pack or a warm soak. SEEK MEDICAL CARE IF:   Symptoms get worse or do not improve in 2 weeks despite treatment.  New, unexplained symptoms develop (drugs used in treatment may produce side effects). EXERCISES RANGE OF MOTION (ROM) AND STRETCHING EXERCISES - Cervical Strain and Sprain These exercises may help you when beginning to rehabilitate your injury. In order to successfully resolve your symptoms, you must improve your posture. These exercises are designed to help reduce the forward-head and rounded-shoulder posture which contributes to this condition. Your symptoms may resolve with or without further involvement from your physician, physical therapist or athletic trainer. While completing these exercises, remember:   Restoring tissue flexibility helps normal motion to return to the joints. This allows healthier, less painful movement and activity.  An effective stretch should be held for at least 20 seconds, although you may need to begin with shorter hold times for comfort.  A stretch should never be painful. You should only feel a gentle lengthening or release in the stretched tissue. STRETCH- Axial Extensors  Lie on your back on the floor. You may bend your knees for comfort. Place a rolled-up hand towel or dish towel, about 2 inches in diameter, under the part of your head that makes contact with the floor.  Gently tuck your chin, as if trying to make a "double  chin," until you feel a gentle stretch at the base of your head.  Hold __________ seconds. Repeat __________ times. Complete this exercise __________ times per day.  STRETCH - Axial Extension   Stand or sit on a firm surface. Assume a good posture: chest up, shoulders drawn back, abdominal muscles slightly tense, knees unlocked (if standing) and feet hip width apart.  Slowly retract your chin so your head slides back and your chin slightly lowers. Continue to look straight ahead.  You should feel a gentle stretch in the back of your head. Be certain not to feel an aggressive stretch since this can cause headaches later.  Hold for __________ seconds. Repeat __________ times. Complete this exercise __________ times per day. STRETCH - Cervical Side Bend   Stand or sit on a firm surface. Assume a good posture: chest up, shoulders drawn back, abdominal muscles slightly tense, knees unlocked (if standing) and feet hip width apart.  Without letting your nose or shoulders move, slowly tip your right / left ear to your shoulder until your feel a gentle stretch in the muscles on the opposite side of your neck.  Hold __________ seconds. Repeat __________ times. Complete this exercise __________ times per day. STRETCH - Cervical Rotators   Stand or sit on a firm surface. Assume a good posture: chest up, shoulders drawn back, abdominal muscles slightly tense, knees unlocked (if standing) and feet hip width apart.  Keeping your eyes level with the ground, slowly turn your head until you feel a gentle stretch  along the back and opposite side of your neck.  Hold __________ seconds. Repeat __________ times. Complete this exercise __________ times per day. RANGE OF MOTION - Neck Circles   Stand or sit on a firm surface. Assume a good posture: chest up, shoulders drawn back, abdominal muscles slightly tense, knees unlocked (if standing) and feet hip width apart.  Gently roll your head down and around  from the back of one shoulder to the back of the other. The motion should never be forced or painful.  Repeat the motion 10-20 times, or until you feel the neck muscles relax and loosen. Repeat __________ times. Complete the exercise __________ times per day. STRENGTHENING EXERCISES - Cervical Strain and Sprain These exercises may help you when beginning to rehabilitate your injury. They may resolve your symptoms with or without further involvement from your physician, physical therapist, or athletic trainer. While completing these exercises, remember:   Muscles can gain both the endurance and the strength needed for everyday activities through controlled exercises.  Complete these exercises as instructed by your physician, physical therapist, or athletic trainer. Progress the resistance and repetitions only as guided.  You may experience muscle soreness or fatigue, but the pain or discomfort you are trying to eliminate should never worsen during these exercises. If this pain does worsen, stop and make certain you are following the directions exactly. If the pain is still present after adjustments, discontinue the exercise until you can discuss the trouble with your clinician. STRENGTH - Cervical Flexors, Isometric  Face a wall, standing about 6 inches away. Place a small pillow, a ball about 6-8 inches in diameter, or a folded towel between your forehead and the wall.  Slightly tuck your chin and gently push your forehead into the soft object. Push only with mild to moderate intensity, building up tension gradually. Keep your jaw and forehead relaxed.  Hold 10 to 20 seconds. Keep your breathing relaxed.  Release the tension slowly. Relax your neck muscles completely before you start the next repetition. Repeat __________ times. Complete this exercise __________ times per day. STRENGTH- Cervical Lateral Flexors, Isometric   Stand about 6 inches away from a wall. Place a small pillow, a ball  about 6-8 inches in diameter, or a folded towel between the side of your head and the wall.  Slightly tuck your chin and gently tilt your head into the soft object. Push only with mild to moderate intensity, building up tension gradually. Keep your jaw and forehead relaxed.  Hold 10 to 20 seconds. Keep your breathing relaxed.  Release the tension slowly. Relax your neck muscles completely before you start the next repetition. Repeat __________ times. Complete this exercise __________ times per day. STRENGTH - Cervical Extensors, Isometric   Stand about 6 inches away from a wall. Place a small pillow, a ball about 6-8 inches in diameter, or a folded towel between the back of your head and the wall.  Slightly tuck your chin and gently tilt your head back into the soft object. Push only with mild to moderate intensity, building up tension gradually. Keep your jaw and forehead relaxed.  Hold 10 to 20 seconds. Keep your breathing relaxed.  Release the tension slowly. Relax your neck muscles completely before you start the next repetition. Repeat __________ times. Complete this exercise __________ times per day. POSTURE AND BODY MECHANICS CONSIDERATIONS - Cervical Strain and Sprain Keeping correct posture when sitting, standing or completing your activities will reduce the stress put on different body tissues,  allowing injured tissues a chance to heal and limiting painful experiences. The following are general guidelines for improved posture. Your physician or physical therapist will provide you with any instructions specific to your needs. While reading these guidelines, remember:  The exercises prescribed by your provider will help you have the flexibility and strength to maintain correct postures.  The correct posture provides the optimal environment for your joints to work. All of your joints have less wear and tear when properly supported by a spine with good posture. This means you will  experience a healthier, less painful body.  Correct posture must be practiced with all of your activities, especially prolonged sitting and standing. Correct posture is as important when doing repetitive low-stress activities (typing) as it is when doing a single heavy-load activity (lifting). PROLONGED STANDING WHILE SLIGHTLY LEANING FORWARD When completing a task that requires you to lean forward while standing in one place for a long time, place either foot up on a stationary 2- to 4-inch high object to help maintain the best posture. When both feet are on the ground, the low back tends to lose its slight inward curve. If this curve flattens (or becomes too large), then the back and your other joints will experience too much stress, fatigue more quickly, and can cause pain.  RESTING POSITIONS Consider which positions are most painful for you when choosing a resting position. If you have pain with flexion-based activities (sitting, bending, stooping, squatting), choose a position that allows you to rest in a less flexed posture. You would want to avoid curling into a fetal position on your side. If your pain worsens with extension-based activities (prolonged standing, working overhead), avoid resting in an extended position such as sleeping on your stomach. Most people will find more comfort when they rest with their spine in a more neutral position, neither too rounded nor too arched. Lying on a non-sagging bed on your side with a pillow between your knees, or on your back with a pillow under your knees will often provide some relief. Keep in mind, being in any one position for a prolonged period of time, no matter how correct your posture, can still lead to stiffness. WALKING Walk with an upright posture. Your ears, shoulders, and hips should all line up. OFFICE WORK When working at a desk, create an environment that supports good, upright posture. Without extra support, muscles fatigue and lead to  excessive strain on joints and other tissues. CHAIR:  A chair should be able to slide under your desk when your back makes contact with the back of the chair. This allows you to work closely.  The chair's height should allow your eyes to be level with the upper part of your monitor and your hands to be slightly lower than your elbows.  Body position:  Your feet should make contact with the floor. If this is not possible, use a foot rest.  Keep your ears over your shoulders. This will reduce stress on your neck and low back.   This information is not intended to replace advice given to you by your health care provider. Make sure you discuss any questions you have with your health care provider.   Document Released: 05/30/2005 Document Revised: 06/20/2014 Document Reviewed: 09/11/2008 Elsevier Interactive Patient Education [employer].

## 2015-08-27 NOTE — Progress Notes (Signed)
Pre visit review using our clinic review tool, if applicable. No additional management support is needed unless otherwise documented below in the visit note. 

## 2015-08-27 NOTE — Progress Notes (Signed)
Subjective:    Patient ID: Harry Carroll, male    DOB: 1961-03-31, 55 y.o.   MRN: [id number]  Chief Complaint  Patient presents with  . Tingling    states that he wakes up at night and his shoulders and arms can get tingly and painful, happens 2-3 times a night    HPI:  Harry Carroll is a 55 y.o. male who  has a past medical history of Colon polyps; Diverticulitis; Hyperlipemia; Insomnia; Arthritis; Heart murmur; Restless leg; Sleep apnea; Bipolar 1 disorder ([place]); Normal cardiac stress test; and Carpal tunnel syndrome. and presents today for an acute office visit.  This is a new problem. Associated symptom of pain and tingling located in his bilateral upper extremities has been going on for the past several weeks . Timing of symptoms is generally worse at night and has a severity enough to disturb his sleep pattern. Denies trauma. He has had bilateral shoulder surgeries. He is right handed dominant. Symptoms are generally greater on the right than the left. No neck pain/stiffness or generalized weakness.   Allergies  Allergen Reactions  . Ciprofloxacin Shortness Of Breath, Nausea And Vomiting, Nausea Only, Rash and Other (See Comments)    Marked mucous production  . Dilaudid [Hydromorphone Hcl] Shortness Of Breath and Nausea Only    Patient developed rash, excessive mucous production and chest pain.  Marland Kitchen Hydrocodone Itching     Current Outpatient Prescriptions on File Prior to Visit  Medication Sig Dispense Refill  . aspirin 81 MG tablet Take 81 mg by mouth 2 (two) times daily.    . divalproex (DEPAKOTE ER) 250 MG 24 hr tablet Take 1 tablet (250 mg total) by mouth every evening. 30 tablet 2  . ibuprofen (ADVIL,MOTRIN) 200 MG tablet Take 400 mg by mouth every 6 (six) hours as needed for moderate pain (pain).    . pravastatin (PRAVACHOL) 20 MG tablet TAKE 2 TABLETS (40 MG TOTAL) BY MOUTH DAILY. 60 tablet 11   No current facility-administered medications on file prior to visit.     Review of Systems  Constitutional: Negative for fever and chills.  Musculoskeletal: Negative for neck pain and neck stiffness.  Neurological: Positive for numbness. Negative for weakness.      Objective:    BP 138/86 mmHg  Pulse 83  Temp(Src) 98.6 F (37 C) (Oral)  Resp 16  Ht 5\' 9"  (1.753 m)  Wt 245 lb (111.131 kg)  BMI 36.16 kg/m2  SpO2 97% Nursing note and vital signs reviewed.  Physical Exam  Constitutional: He is oriented to person, place, and time. He appears well-developed and well-nourished. No distress.  Cardiovascular: Normal rate, regular rhythm, normal heart sounds and intact distal pulses.   Pulmonary/Chest: Effort normal and breath sounds normal.  Musculoskeletal:  Cervical / upper extremities - no obvious deformity, discoloration, or edema noted. No palpable tenderness able to be elicited. Range of motion is normal in all upper extremity motions and slightly limited in lateral neck bending and rotation. Negative cervical compression and Phalen's. Distal pulses and sensation intact and appropriate.  Neurological: He is alert and oriented to person, place, and time.  Skin: Skin is warm and dry.  Psychiatric: He has a normal mood and affect. His behavior is normal. Judgment and thought content normal.       Assessment & Plan:   Problem List Items Addressed This Visit      Other   Numbness and tingling of both upper extremities while sleeping - Primary  Symptoms and exam consistent with potential carpal tunnel syndrome or nerve impingement cervical region. Treat conservatively with over-the-counter medications as needed for symptom relief and supportive care. Initiate home exercise therapy. Consider cockup wrist splint for carpal tunnel syndrome. Follow-up if symptoms worsen or fail to improve with conservative treatment.

## 2015-08-27 NOTE — Assessment & Plan Note (Signed)
Symptoms and exam consistent with potential carpal tunnel syndrome or nerve impingement cervical region. Treat conservatively with over-the-counter medications as needed for symptom relief and supportive care. Initiate home exercise therapy. Consider cockup wrist splint for carpal tunnel syndrome. Follow-up if symptoms worsen or fail to improve with conservative treatment.

## 2015-08-28 ENCOUNTER — Ambulatory Visit: Payer: Self-pay | Admitting: Family

## 2015-10-21 ENCOUNTER — Encounter (HOSPITAL_COMMUNITY): Payer: Self-pay | Admitting: Emergency Medicine

## 2015-10-21 ENCOUNTER — Emergency Department (HOSPITAL_COMMUNITY): Payer: 59

## 2015-10-21 ENCOUNTER — Telehealth: Payer: Self-pay | Admitting: Gastroenterology

## 2015-10-21 ENCOUNTER — Observation Stay (HOSPITAL_COMMUNITY)
Admission: EM | Admit: 2015-10-21 | Discharge: 2015-10-23 | Disposition: A | Discharge status: Home / Self Care | Payer: 59 | Attending: Internal Medicine | Admitting: Internal Medicine

## 2015-10-21 DIAGNOSIS — Z86010 Personal history of colonic polyps: Secondary | ICD-10-CM | POA: Diagnosis not present

## 2015-10-21 DIAGNOSIS — Z9989 Dependence on other enabling machines and devices: Secondary | ICD-10-CM | POA: Insufficient documentation

## 2015-10-21 DIAGNOSIS — M1990 Unspecified osteoarthritis, unspecified site: Secondary | ICD-10-CM | POA: Diagnosis not present

## 2015-10-21 DIAGNOSIS — F17290 Nicotine dependence, other tobacco product, uncomplicated: Secondary | ICD-10-CM | POA: Insufficient documentation

## 2015-10-21 DIAGNOSIS — F17210 Nicotine dependence, cigarettes, uncomplicated: Secondary | ICD-10-CM | POA: Diagnosis not present

## 2015-10-21 DIAGNOSIS — K5792 Diverticulitis of intestine, part unspecified, without perforation or abscess without bleeding: Secondary | ICD-10-CM | POA: Diagnosis not present

## 2015-10-21 DIAGNOSIS — F319 Bipolar disorder, unspecified: Secondary | ICD-10-CM | POA: Insufficient documentation

## 2015-10-21 DIAGNOSIS — K5732 Diverticulitis of large intestine without perforation or abscess without bleeding: Secondary | ICD-10-CM

## 2015-10-21 DIAGNOSIS — E785 Hyperlipidemia, unspecified: Secondary | ICD-10-CM | POA: Diagnosis not present

## 2015-10-21 DIAGNOSIS — G4733 Obstructive sleep apnea (adult) (pediatric): Secondary | ICD-10-CM | POA: Insufficient documentation

## 2015-10-21 DIAGNOSIS — Z9049 Acquired absence of other specified parts of digestive tract: Secondary | ICD-10-CM | POA: Diagnosis not present

## 2015-10-21 DIAGNOSIS — R109 Unspecified abdominal pain: Secondary | ICD-10-CM | POA: Diagnosis present

## 2015-10-21 LAB — URINALYSIS, ROUTINE W REFLEX MICROSCOPIC
BILIRUBIN URINE: NEGATIVE
Glucose, UA: NEGATIVE mg/dL
Hgb urine dipstick: NEGATIVE
KETONES UR: NEGATIVE mg/dL
LEUKOCYTES UA: NEGATIVE
NITRITE: NEGATIVE
PH: 6.5 (ref 5.0–8.0)
PROTEIN: NEGATIVE mg/dL
Specific Gravity, Urine: 1.015 (ref 1.005–1.030)

## 2015-10-21 LAB — COMPREHENSIVE METABOLIC PANEL
ALK PHOS: 45 U/L (ref 38–126)
ALT: 32 U/L (ref 17–63)
ANION GAP: 9 (ref 5–15)
AST: 23 U/L (ref 15–41)
Albumin: 4.8 g/dL (ref 3.5–5.0)
BILIRUBIN TOTAL: 1.4 mg/dL — AB (ref 0.3–1.2)
BUN: 17 mg/dL (ref 6–20)
CALCIUM: 9.6 mg/dL (ref 8.9–10.3)
CO2: 26 mmol/L (ref 22–32)
CREATININE: 1.16 mg/dL (ref 0.61–1.24)
Chloride: 101 mmol/L (ref 101–111)
Glucose, Bld: 103 mg/dL — ABNORMAL HIGH (ref 65–99)
Potassium: 4.2 mmol/L (ref 3.5–5.1)
Sodium: 136 mmol/L (ref 135–145)
TOTAL PROTEIN: 7.7 g/dL (ref 6.5–8.1)

## 2015-10-21 LAB — CBC
HEMATOCRIT: 44.6 % (ref 39.0–52.0)
Hemoglobin: 16.1 g/dL (ref 13.0–17.0)
MCH: 31.6 pg (ref 26.0–34.0)
MCHC: 36.1 g/dL — ABNORMAL HIGH (ref 30.0–36.0)
MCV: 87.6 fL (ref 78.0–100.0)
Platelets: 215 10*3/uL (ref 150–400)
RBC: 5.09 MIL/uL (ref 4.22–5.81)
RDW: 13 % (ref 11.5–15.5)
WBC: 12.4 10*3/uL — AB (ref 4.0–10.5)

## 2015-10-21 LAB — LIPASE, BLOOD: Lipase: 27 U/L (ref 11–51)

## 2015-10-21 MED ORDER — TRAMADOL HCL 50 MG PO TABS
50.0000 mg | ORAL_TABLET | Freq: Four times a day (QID) | ORAL | Status: DC | PRN
  Administered 2015-10-22: 50 mg via ORAL
  Filled 2015-10-21: qty 1

## 2015-10-21 MED ORDER — METRONIDAZOLE IN NACL 5-0.79 MG/ML-% IV SOLN
500.0000 mg | Freq: Three times a day (TID) | INTRAVENOUS | Status: DC
  Administered 2015-10-22 – 2015-10-23 (×5): 500 mg via INTRAVENOUS
  Filled 2015-10-21 (×5): qty 100

## 2015-10-21 MED ORDER — DEXTROSE 5 % IV SOLN: 1.0000 g | INTRAVENOUS | Status: DC

## 2015-10-21 MED ORDER — SODIUM CHLORIDE 0.9 % IV SOLN
INTRAVENOUS | Status: AC
  Administered 2015-10-22: 100 mL/h via INTRAVENOUS

## 2015-10-21 MED ORDER — MORPHINE SULFATE (PF) 4 MG/ML IV SOLN
4.0000 mg | Freq: Once | INTRAVENOUS | Status: AC
Start: 2015-10-21 — End: 2015-10-21
  Administered 2015-10-21: 4 mg via INTRAVENOUS
  Filled 2015-10-21: qty 1

## 2015-10-21 MED ORDER — ENOXAPARIN SODIUM 40 MG/0.4ML ~~LOC~~ SOLN
40.0000 mg | Freq: Every day | SUBCUTANEOUS | Status: DC
  Administered 2015-10-22 (×2): 40 mg via SUBCUTANEOUS
  Filled 2015-10-21 (×3): qty 0.4

## 2015-10-21 MED ORDER — ONDANSETRON HCL 4 MG PO TABS: 4.0000 mg | ORAL_TABLET | Freq: Four times a day (QID) | ORAL | Status: DC | PRN

## 2015-10-21 MED ORDER — ONDANSETRON HCL 4 MG/2ML IJ SOLN: 4.0000 mg | Freq: Four times a day (QID) | INTRAMUSCULAR | Status: DC | PRN

## 2015-10-21 MED ORDER — ONDANSETRON HCL 4 MG/2ML IJ SOLN
4.0000 mg | Freq: Once | INTRAMUSCULAR | Status: AC
  Administered 2015-10-21: 4 mg via INTRAVENOUS
  Filled 2015-10-21: qty 2

## 2015-10-21 MED ORDER — DIATRIZOATE MEGLUMINE & SODIUM 66-10 % PO SOLN: 15.0000 mL | Freq: Once | ORAL | Status: DC | PRN

## 2015-10-21 MED ORDER — AMOXICILLIN-POT CLAVULANATE 875-125 MG PO TABS: 1.0000 | ORAL_TABLET | Freq: Two times a day (BID) | ORAL | Status: DC

## 2015-10-21 MED ORDER — SODIUM CHLORIDE 0.9 % IV SOLN
3.0000 g | Freq: Once | INTRAVENOUS | Status: AC
  Administered 2015-10-21: 3 g via INTRAVENOUS
  Filled 2015-10-21: qty 3

## 2015-10-21 MED ORDER — IOPAMIDOL (ISOVUE-300) INJECTION 61%
100.0000 mL | Freq: Once | INTRAVENOUS | Status: AC | PRN
  Administered 2015-10-21: 100 mL via INTRAVENOUS

## 2015-10-21 MED ORDER — METRONIDAZOLE IN NACL 5-0.79 MG/ML-% IV SOLN
500.0000 mg | Freq: Once | INTRAVENOUS | Status: AC
  Administered 2015-10-22: 500 mg via INTRAVENOUS
  Filled 2015-10-21: qty 100

## 2015-10-21 MED ORDER — ACETAMINOPHEN 325 MG PO TABS
650.0000 mg | ORAL_TABLET | Freq: Four times a day (QID) | ORAL | Status: DC | PRN
  Administered 2015-10-22: 650 mg via ORAL
  Filled 2015-10-21: qty 2

## 2015-10-21 MED ORDER — ACETAMINOPHEN 650 MG RE SUPP: 650.0000 mg | Freq: Four times a day (QID) | RECTAL | Status: DC | PRN

## 2015-10-21 NOTE — H&P (Signed)
History and Physical    EDGEL VI [id number] DOB: [birth year] DOA: 10/21/2015  Referring Provider: EDP PCP: Mauricio Po, [place]  Outpatient Specialists: Dr. Deatra Ina (GI), Dr. Doyne Keel (psychiatry)   Patient coming from: Home  Chief Complaint: Fever, LLQ abd pain   HPI: Harry Carroll is a 55 y.o. male with medical history significant for recurrent diverticulitis status post partial colectomy in July 2013, OSA, hyperlipidemia, and questionable bipolar disorder who presents to the ED with 1 day of fevers and left lower quadrant abdominal pain reminiscent of his prior diverticulitis symptoms. Patient reports waking the morning of 10/21/2015 in his usual state of health, but noted some subjective fever and left lower quadrant abdominal pain while at work. His symptoms gradually worsened throughout the day and by the time he arrived home, patient reports shaking with chills. His wife called his gastroenterologist office and it was advised that he start a course of Augmentin and clear liquid diet. He was advised to report to the emergency department if his symptoms worsened or if he was unable to tolerate oral intake. He took his first dose of Augmentin this evening, but his symptoms continued to progress and he came in for evaluation. He denies any nausea or vomiting and denies diarrhea.   ED Course: Upon arrival to the ED, patient is found to have a temperature 37.7 C, saturate well on room air, and with remaining vitals stable. CMP was notable for a mild elevation in total bilirubin and CBC features a leukocytosis to 12,400. Urine was obtained for analysis and grossly negative for infection. CT of the abdomen and pelvis was obtained and features inflammatory changes at the distal descending colon and a region which featured several diverticuli on the prior scan and is felt to be most consistent with acute diverticulitis. There is no abscess or evidence of perforation. Patient was started on  empiric Unasyn and Flagyl in the emergency department and symptomatic management was provided in the form of IV morphine and Zofran. Patient remained hemodynamically stable in the ED and will be admitted to the hospital for ongoing evaluation and management of acute diverticulitis.  Review of Systems:  All other systems reviewed and apart from HPI, are negative.  Past Medical History  Diagnosis Date  . Colon polyps     adenomatous  . Diverticulitis   . Hyperlipemia   . Insomnia   . Arthritis   . Heart murmur   . Restless leg   . Sleep apnea   . Bipolar 1 disorder (HCC)     Dr Clovis Pu   . Normal cardiac stress test     CP 01-2009---Nl ECHO and stress test neg  . Carpal tunnel syndrome     saw Dr Kristie Cowman     Past Surgical History  Procedure Laterality Date  . Maxillofacial surgery    . Right shoulder surgery      X2  . Right knee arthroscopy      2007  . Shoulder surgery      LEFT  . Partial colectomy  12/20/2011    Procedure: PARTIAL COLECTOMY;  Surgeon: Earnstine Regal, MD;  Location: [facility];  Service: General;  Laterality: N/A;  Sigmoid colectomy  . Cystoscopy with urethral dilatation  12/20/2011    Procedure: CYSTOSCOPY WITH URETHRAL DILATATION;  Surgeon: Earnstine Regal, MD;  Location: [facility];  Service: General;;  with foley insertion  . Cystoscopy with urethral dilatation  12/20/2011    Procedure: CYSTOSCOPY WITH URETHRAL DILATATION;  Surgeon:  Franchot Gallo, MD;  Location: [facility];  Service: Urology;;  . Colonoscopy with propofol N/A 04/02/2014    Procedure: COLONOSCOPY WITH PROPOFOL;  Surgeon: Inda Castle, MD;  Location: [facility];  Service: Endoscopy;  Laterality: N/A;  . Hot hemostasis N/A 04/02/2014    Procedure: HOT HEMOSTASIS (ARGON PLASMA COAGULATION/BICAP);  Surgeon: Inda Castle, MD;  Location: [facility];  Service: Endoscopy;  Laterality: N/A;     reports that he has been smoking Cigarettes.  His smokeless tobacco use includes Snuff. He reports that he  drinks alcohol. He reports that he does not use illicit drugs.  Allergies  Allergen Reactions  . Ciprofloxacin Shortness Of Breath, Nausea And Vomiting, Nausea Only, Rash and Other (See Comments)    Marked mucous production  . Dilaudid [Hydromorphone Hcl] Shortness Of Breath and Nausea Only    Patient developed rash, excessive mucous production and chest pain.  Marland Kitchen Hydrocodone Itching    Family History  Problem Relation Age of Onset  . Prostate cancer Paternal Grandfather   . Cancer Paternal Aunt     lymph cancer  . Colon cancer Neg Hx   . CAD Neg Hx   . Pancreatic cancer Neg Hx   . Rectal cancer Neg Hx   . Stomach cancer Neg Hx   . Drug abuse Neg Hx   . Anxiety disorder Neg Hx   . Bipolar disorder Neg Hx   . Depression Neg Hx   . Alcohol abuse Father   . Alcohol abuse Sister      Prior to Admission medications   Medication Sig Start Date End Date Taking? Authorizing Provider  amoxicillin-clavulanate (AUGMENTIN) 875-125 MG tablet Take 1 tablet by mouth 2 (two) times daily. 10/21/15  Yes Manus Gunning, MD  traMADol (ULTRAM) 50 MG tablet Take 50 mg by mouth every 6 (six) hours as needed for moderate pain or severe pain.   Yes Historical Provider, MD  divalproex (DEPAKOTE ER) 250 MG 24 hr tablet Take 1 tablet (250 mg total) by mouth every evening. Patient not taking: Reported on 10/21/2015 08/18/15   Charlcie Cradle, MD  pravastatin (PRAVACHOL) 20 MG tablet TAKE 2 TABLETS (40 MG TOTAL) BY MOUTH DAILY. Patient not taking: Reported on 10/21/2015 05/27/15   Golden Circle, FNP    Physical Exam: Filed Vitals:   10/21/15 1925 10/21/15 2254 10/21/15 2328  BP: 148/97 128/83 132/88  Pulse: 115 89 83  Temp: 99.8 F (37.7 C)    TempSrc: Oral    Resp: 18 16 16   Height: 5\' 9"  (1.753 m)    Weight: 106.595 kg (235 lb)    SpO2: 98% 97% 97%      Constitutional: NAD, calm, in apparent discomfort Eyes: PERTLA, lids and conjunctivae normal ENMT: Mucous membranes are moist.  Posterior pharynx clear of any exudate or lesions.   Neck: normal, supple, no masses, no thyromegaly Respiratory: clear to auscultation bilaterally, no wheezing, no crackles. Normal respiratory effort.   Cardiovascular: S1 & S2 heard, regular rate and rhythm, no significant murmurs / rubs / gallops. No extremity edema. 2+ pedal pulses.   Abdomen: No distension, tender in LLQ, no masses palpated. No rebound pain or guarding. Bowel sounds normal.  Musculoskeletal: no clubbing / cyanosis. No joint deformity upper and lower extremities. Normal muscle tone.  Skin: no rashes, lesions, ulcers. No induration Neurologic: CN 2-12 grossly intact. Sensation intact, DTR normal. Strength 5/5 in all 4 limbs.  Psychiatric: Normal judgment and insight. Alert and oriented x 3. Normal  mood.     Labs on Admission: I have personally reviewed following labs and imaging studies  CBC:  Recent Labs Lab 10/21/15 1945  WBC 12.4*  HGB 16.1  HCT 44.6  MCV 87.6  PLT 123456   Basic Metabolic Panel:  Recent Labs Lab 10/21/15 1945  NA 136  K 4.2  CL 101  CO2 26  GLUCOSE 103*  BUN 17  CREATININE 1.16  CALCIUM 9.6   GFR: Estimated Creatinine Clearance: 85.6 mL/min (by C-G formula based on Cr of 1.16). Liver Function Tests:  Recent Labs Lab 10/21/15 1945  AST 23  ALT 32  ALKPHOS 45  BILITOT 1.4*  PROT 7.7  ALBUMIN 4.8    Recent Labs Lab 10/21/15 1945  LIPASE 27   No results for input(s): AMMONIA in the last 168 hours. Coagulation Profile: No results for input(s): INR, PROTIME in the last 168 hours. Cardiac Enzymes: No results for input(s): CKTOTAL, CKMB, CKMBINDEX, TROPONINI in the last 168 hours. BNP (last 3 results) No results for input(s): PROBNP in the last 8760 hours. HbA1C: No results for input(s): HGBA1C in the last 72 hours. CBG: No results for input(s): GLUCAP in the last 168 hours. Lipid Profile: No results for input(s): CHOL, HDL, LDLCALC, TRIG, CHOLHDL, LDLDIRECT in the  last 72 hours. Thyroid Function Tests: No results for input(s): TSH, T4TOTAL, FREET4, T3FREE, THYROIDAB in the last 72 hours. Anemia Panel: No results for input(s): VITAMINB12, FOLATE, FERRITIN, TIBC, IRON, RETICCTPCT in the last 72 hours. Urine analysis:    Component Value Date/Time   COLORURINE YELLOW 10/21/2015 2025   APPEARANCEUR CLEAR 10/21/2015 2025   LABSPEC 1.015 10/21/2015 2025   PHURINE 6.5 10/21/2015 2025   GLUCOSEU NEGATIVE 10/21/2015 2025   HGBUR NEGATIVE 10/21/2015 2025   BILIRUBINUR NEGATIVE 10/21/2015 2025   KETONESUR NEGATIVE 10/21/2015 2025   PROTEINUR NEGATIVE 10/21/2015 2025   UROBILINOGEN 0.2 02/14/2013 1932   NITRITE NEGATIVE 10/21/2015 2025   LEUKOCYTESUR NEGATIVE 10/21/2015 2025   Sepsis Labs: @LABRCNTIP (procalcitonin:4,lacticidven:4) )No results found for this or any previous visit (from the past 240 hour(s)).   Radiological Exams on Admission: Ct Abdomen Pelvis W Contrast  10/21/2015  CLINICAL DATA:  55 year old male with history of diverticulitis presenting with abdominal pain. EXAM: CT ABDOMEN AND PELVIS WITH CONTRAST TECHNIQUE: Multidetector CT imaging of the abdomen and pelvis was performed using the standard protocol following bolus administration of intravenous contrast. CONTRAST:  179mL ISOVUE-300 IOPAMIDOL (ISOVUE-300) INJECTION 61% COMPARISON:  Abdominal CT dated 02/14/2013 FINDINGS: The visualized lung bases are clear. No intra-abdominal free air or free fluid noted. Multiple small gallstones. No pericholecystic fluid. The liver, pancreas, spleen, adrenal glands, kidneys, visualized ureters, and urinary bladder appear unremarkable. The prostate and seminal vesicles are grossly unremarkable. There are small scattered colonic diverticula. There is inflammatory changes and circumferential thickening of a 6 cm segment of the distal descending colon. Multiple small diverticula noted in this region on the prior CT. This focal area of inflammatory changes most  likely represents diverticulitis and less likely focal colitis. Underlying mass is less likely but not excluded. Clinical correlation and follow-up recommended. No drainable fluid collection/abscess or evidence of perforation. There is no evidence of bowel obstruction. Normal appendix. There is mild aortoiliac atherosclerotic disease. The origins of the celiac axis, SMA, IMA as well as the origins of the renal arteries are patent. The SMV, splenic vein, and main portal veins are patent. No portal venous gas identified. There is no adenopathy. The abdominal wall soft tissues appear unremarkable. There  is degenerative changes of the spine. No acute fracture. IMPRESSION: Acute diverticulitis versus less likely focal segmental colitis of the distal descending colon. No abscess. Follow-up recommended to exclude underlying mass. Cholelithiasis. Electronically Signed   By: Anner Crete M.D.   On: 10/21/2015 21:42    EKG:  Not performed, will obtain as appropriate  Assessment/Plan  1. Diverticulitis  - Pt with fevers at home, leukocytosis, tachycardia, and LLQ abd pain reminiscent of prior diverticulitis  - CT findings c/w diverticulitis in distal descending colon without complication  - Pt has history of recurrent diverticulitis and underwent partial colectomy in July 2013  - Colonoscopy in October 2015 featured diverticulosis and a single sessile polyp that was snared and ID'd as tubular adenoma by path  - Pt took one dose Augmentin prior to coming in; treated with empiric Unasyn and Flagyl in ED  - Continue empiric treatment with Rocephin and Flagyl  - Clear liquid diet for now, advance as appropriate  - Pain-control prn   2. OSA  - CPAP qHS   3. Bipolar disorder  - Previously on Lamictal and Depakote - Following with psychiatry outpt and has been taken off both medications  - Pt denies any depression or mania since medications discontinued, denies SI, HI, hallucinations  - Monitor    DVT  prophylaxis: sq Lovenox Code Status: Full  Family Communication: Wife at bedside  Disposition Plan: Admit to med/surg   Admission status: Observation    Vianne Bulls MD [facility] Pager [phone]  If 7PM-7AM, please contact night-coverage [email or web address] Password [email or web address]  10/21/2015, 11:58 PM

## 2015-10-21 NOTE — ED Provider Notes (Signed)
CSN: [id number]     Arrival date & time 10/21/15  1909 History   First MD Initiated Contact with Patient 10/21/15 2023     Chief Complaint  Patient presents with  . Abdominal Pain     (Consider location/radiation/quality/duration/timing/severity/associated sxs/prior Treatment) Patient is a 55 y.o. male presenting with abdominal pain. The history is provided by the patient (Patient complains of left lower quadrant pain. Patient has had history of diverticulitis before. Patient also has had a fever).  Abdominal Pain Pain location:  LLQ Pain quality: aching   Pain radiates to:  Does not radiate Pain severity:  Moderate Onset quality:  Sudden Timing:  Constant Progression:  Waxing and waning Chronicity:  New Relieved by:  Nothing Associated symptoms: no chest pain, no cough, no diarrhea, no fatigue and no hematuria     Past Medical History  Diagnosis Date  . Colon polyps     adenomatous  . Diverticulitis   . Hyperlipemia   . Insomnia   . Arthritis   . Heart murmur   . Restless leg   . Sleep apnea   . Bipolar 1 disorder (HCC)     Dr Clovis Pu   . Normal cardiac stress test     CP 01-2009---Nl ECHO and stress test neg  . Carpal tunnel syndrome     saw Dr Kristie Cowman    Past Surgical History  Procedure Laterality Date  . Maxillofacial surgery    . Right shoulder surgery      X2  . Right knee arthroscopy      2007  . Shoulder surgery      LEFT  . Partial colectomy  12/20/2011    Procedure: PARTIAL COLECTOMY;  Surgeon: Earnstine Regal, MD;  Location: [facility];  Service: General;  Laterality: N/A;  Sigmoid colectomy  . Cystoscopy with urethral dilatation  12/20/2011    Procedure: CYSTOSCOPY WITH URETHRAL DILATATION;  Surgeon: Earnstine Regal, MD;  Location: [facility];  Service: General;;  with foley insertion  . Cystoscopy with urethral dilatation  12/20/2011    Procedure: CYSTOSCOPY WITH URETHRAL DILATATION;  Surgeon: Franchot Gallo, MD;  Location: [facility];  Service: Urology;;  .  Colonoscopy with propofol N/A 04/02/2014    Procedure: COLONOSCOPY WITH PROPOFOL;  Surgeon: Inda Castle, MD;  Location: [facility];  Service: Endoscopy;  Laterality: N/A;  . Hot hemostasis N/A 04/02/2014    Procedure: HOT HEMOSTASIS (ARGON PLASMA COAGULATION/BICAP);  Surgeon: Inda Castle, MD;  Location: [facility];  Service: Endoscopy;  Laterality: N/A;   Family History  Problem Relation Age of Onset  . Prostate cancer Paternal Grandfather   . Cancer Paternal Aunt     lymph cancer  . Colon cancer Neg Hx   . CAD Neg Hx   . Pancreatic cancer Neg Hx   . Rectal cancer Neg Hx   . Stomach cancer Neg Hx   . Drug abuse Neg Hx   . Anxiety disorder Neg Hx   . Bipolar disorder Neg Hx   . Depression Neg Hx   . Alcohol abuse Father   . Alcohol abuse Sister    Social History  Substance Use Topics  . Smoking status: Current Some Day Smoker    Types: Cigarettes  . Smokeless tobacco: Current User    Types: Snuff  . Alcohol Use: Yes     Comment:  a couple beers a few times a week     Review of Systems  Constitutional: Negative for appetite change and fatigue.  HENT: Negative for congestion, ear discharge and sinus pressure.   Eyes: Negative for discharge.  Respiratory: Negative for cough.   Cardiovascular: Negative for chest pain.  Gastrointestinal: Positive for abdominal pain. Negative for diarrhea.  Genitourinary: Negative for frequency and hematuria.  Musculoskeletal: Negative for back pain.  Skin: Negative for rash.  Neurological: Negative for seizures and headaches.  Psychiatric/Behavioral: Negative for hallucinations.      Allergies  Ciprofloxacin; Dilaudid; and Hydrocodone  Home Medications   Prior to Admission medications   Medication Sig Start Date End Date Taking? Authorizing Provider  amoxicillin-clavulanate (AUGMENTIN) 875-125 MG tablet Take 1 tablet by mouth 2 (two) times daily. 10/21/15  Yes Manus Gunning, MD  traMADol (ULTRAM) 50 MG tablet Take  50 mg by mouth every 6 (six) hours as needed for moderate pain or severe pain.   Yes Historical Provider, MD  divalproex (DEPAKOTE ER) 250 MG 24 hr tablet Take 1 tablet (250 mg total) by mouth every evening. Patient not taking: Reported on 10/21/2015 08/18/15   Charlcie Cradle, MD  pravastatin (PRAVACHOL) 20 MG tablet TAKE 2 TABLETS (40 MG TOTAL) BY MOUTH DAILY. Patient not taking: Reported on 10/21/2015 05/27/15   Golden Circle, FNP   BP 148/97 mmHg  Pulse 115  Temp(Src) 99.8 F (37.7 C) (Oral)  Resp 18  Ht 5\' 9"  (1.753 m)  Wt 235 lb (106.595 kg)  BMI 34.69 kg/m2  SpO2 98% Physical Exam  Constitutional: He is oriented to person, place, and time. He appears well-developed.  HENT:  Head: Normocephalic.  Eyes: Conjunctivae and EOM are normal. No scleral icterus.  Neck: Neck supple. No thyromegaly present.  Cardiovascular: Normal rate and regular rhythm.  Exam reveals no gallop and no friction rub.   No murmur heard. Pulmonary/Chest: No stridor. He has no wheezes. He has no rales. He exhibits no tenderness.  Abdominal: He exhibits no distension. There is tenderness. There is no rebound.  Tender left lower quadrant  Musculoskeletal: Normal range of motion. He exhibits no edema.  Lymphadenopathy:    He has no cervical adenopathy.  Neurological: He is oriented to person, place, and time. He exhibits normal muscle tone. Coordination normal.  Skin: No rash noted. No erythema.  Psychiatric: He has a normal mood and affect. His behavior is normal.    ED Course  Procedures (including critical care time) Labs Review Labs Reviewed  COMPREHENSIVE METABOLIC PANEL - Abnormal; Notable for the following:    Glucose, Bld 103 (*)    Total Bilirubin 1.4 (*)    All other components within normal limits  CBC - Abnormal; Notable for the following:    WBC 12.4 (*)    MCHC 36.1 (*)    All other components within normal limits  LIPASE, BLOOD  URINALYSIS, ROUTINE W REFLEX MICROSCOPIC (NOT AT [facility])     Imaging Review Ct Abdomen Pelvis W Contrast  10/21/2015  CLINICAL DATA:  55 year old male with history of diverticulitis presenting with abdominal pain. EXAM: CT ABDOMEN AND PELVIS WITH CONTRAST TECHNIQUE: Multidetector CT imaging of the abdomen and pelvis was performed using the standard protocol following bolus administration of intravenous contrast. CONTRAST:  128mL ISOVUE-300 IOPAMIDOL (ISOVUE-300) INJECTION 61% COMPARISON:  Abdominal CT dated 02/14/2013 FINDINGS: The visualized lung bases are clear. No intra-abdominal free air or free fluid noted. Multiple small gallstones. No pericholecystic fluid. The liver, pancreas, spleen, adrenal glands, kidneys, visualized ureters, and urinary bladder appear unremarkable. The prostate and seminal vesicles are grossly unremarkable. There are small scattered colonic diverticula.  There is inflammatory changes and circumferential thickening of a 6 cm segment of the distal descending colon. Multiple small diverticula noted in this region on the prior CT. This focal area of inflammatory changes most likely represents diverticulitis and less likely focal colitis. Underlying mass is less likely but not excluded. Clinical correlation and follow-up recommended. No drainable fluid collection/abscess or evidence of perforation. There is no evidence of bowel obstruction. Normal appendix. There is mild aortoiliac atherosclerotic disease. The origins of the celiac axis, SMA, IMA as well as the origins of the renal arteries are patent. The SMV, splenic vein, and main portal veins are patent. No portal venous gas identified. There is no adenopathy. The abdominal wall soft tissues appear unremarkable. There is degenerative changes of the spine. No acute fracture. IMPRESSION: Acute diverticulitis versus less likely focal segmental colitis of the distal descending colon. No abscess. Follow-up recommended to exclude underlying mass. Cholelithiasis. Electronically Signed   By: Anner Crete M.D.   On: 10/21/2015 21:42   I have personally reviewed and evaluated these images and lab results as part of my medical decision-making.   EKG Interpretation None      MDM   Final diagnoses:  Diverticulitis of large intestine without perforation or abscess without bleeding    Patient is being admitted for diverticulitis    Milton Ferguson, MD 10/21/15 2247

## 2015-10-21 NOTE — Telephone Encounter (Signed)
Patient's wife called. He is having  LLQ pain and subjective fevers, consistent with his prior diverticulitis symptoms for which he has had surgery in the past. Last colonoscopy showed he still had remnant diverticulosis. Patient is tolerating PO, symptoms just started today. No vomiting. He is allergic to ciprofloxacin. In this light I offered a course of Augmentin BID for 10 days for this and treat empirically given this is his index symptom. He should stay on a clear liquid diet today and then bland tomorrow.  If he worsens or can't tolerate PO, etc, then he will need to go to the ER for further evaluation. Wife verbalized understanding, I will put this through to [facility], they will call with any questions / concerns moving forward

## 2015-10-21 NOTE — ED Notes (Signed)
Pt is c/o abd pain on the left side  Pt has hx of diverticulitis and has had colon surgery in the past  Pt states he started running a fever today  Pt was seen by his dr today and was given augmentin and took his first dose today at 7pm  Pt states the pain has gotten worse since then  Denies blood in his stool

## 2015-10-22 DIAGNOSIS — K5792 Diverticulitis of intestine, part unspecified, without perforation or abscess without bleeding: Secondary | ICD-10-CM

## 2015-10-22 LAB — CBC
HEMATOCRIT: 39.9 % (ref 39.0–52.0)
HEMOGLOBIN: 14 g/dL (ref 13.0–17.0)
MCH: 31.3 pg (ref 26.0–34.0)
MCHC: 35.1 g/dL (ref 30.0–36.0)
MCV: 89.3 fL (ref 78.0–100.0)
Platelets: 175 10*3/uL (ref 150–400)
RBC: 4.47 MIL/uL (ref 4.22–5.81)
RDW: 13.3 % (ref 11.5–15.5)
WBC: 6.4 10*3/uL (ref 4.0–10.5)

## 2015-10-22 LAB — PROCALCITONIN: Procalcitonin: <0.1 ng/mL

## 2015-10-22 LAB — GLUCOSE, CAPILLARY: GLUCOSE-CAPILLARY: 109 mg/dL — AB (ref 65–99)

## 2015-10-22 MED ORDER — DEXTROSE 5 % IV SOLN
2.0000 g | Freq: Every day | INTRAVENOUS | Status: DC
  Administered 2015-10-22 (×2): 2 g via INTRAVENOUS
  Filled 2015-10-22 (×3): qty 2

## 2015-10-22 MED ORDER — SODIUM CHLORIDE 0.9 % IV SOLN
INTRAVENOUS | Status: DC
  Administered 2015-10-22 – 2015-10-23 (×3): via INTRAVENOUS

## 2015-10-22 NOTE — Progress Notes (Signed)
PROGRESS NOTE    Harry Carroll  [id number] DOB: [birth year] DOA: 10/21/2015 PCP: Mauricio Po, FNP  Outpatient Specialists: Dr Deatra Ina , [facility]     Brief Narrative: Harry Carroll is a 55 y.o. male with medical history significant for recurrent diverticulitis status post partial colectomy in July 2013, OSA, hyperlipidemia, and questionable bipolar disorder who presents to the ED with 1 day of fevers and left lower quadrant abdominal pain reminiscent of his prior diverticulitis symptoms. Patient reports waking the morning of 10/21/2015 in his usual state of health, but noted some subjective fever and left lower quadrant abdominal pain while at work. His symptoms gradually worsened throughout the day and by the time he arrived home, patient reports shaking with chills.   CT of the abdomen and pelvis was obtained and features inflammatory changes at the distal descending colon and a region which featured several diverticuli on the prior scan and is felt to be most consistent with acute diverticulitis.  Assessment & Plan:   Principal Problem:   Acute diverticulitis Active Problems:   Hyperlipidemia   BIPOLAR DISORDER UNSPECIFIED   OSA (obstructive sleep apnea)  1-Diverticulitis. Continue with ceftriaxone and flagyl.  Clear diet.  Informed GI of patient admission.  CT with diverticulitis vs colitis. Needs repeat CT scan at some point to rule out underline mass.  IV fluids.   2. OSA  - CPAP qHS   3. Bipolar disorder  - Previously on Lamictal and Depakote - Following with psychiatry outpt and has been taken off both medications    DVT prophylaxis: Lovenox Code Status: full code.  Family Communication: care discussed with wife and daughter who were at bedside.  Disposition Plan: remain inpatient for Tx of diverticulitis. Home in 2 to 3 days depending on clinical improvement.    Consultants:   GI   Procedures:   none  Antimicrobials:  Ceftriaxone and flagyl.    Subjective: Report improvement of abdominal pain,left Lower quadrant.  Non nausea or vomiting.  Mild headaches  Objective: Filed Vitals:   10/21/15 2254 10/21/15 2328 10/22/15 0027 10/22/15 1400  BP: 128/83 132/88 133/87 118/68  Pulse: 89 83 82 65  Temp:   99 F (37.2 C) 98.1 F (36.7 C)  TempSrc:   Oral Oral  Resp: 16 16 16 18   Height:   5\' 9"  (1.753 m)   Weight:   109.181 kg (240 lb 11.2 oz)   SpO2: 97% 97% 98% 99%    Intake/Output Summary (Last 24 hours) at 10/22/15 1459 Last data filed at 10/22/15 1400  Gross per 24 hour  Intake 1098.33 ml  Output   2300 ml  Net -1201.67 ml   Filed Weights   10/21/15 1925 10/22/15 0027  Weight: 106.595 kg (235 lb) 109.181 kg (240 lb 11.2 oz)    Examination:  General exam: Appears calm and comfortable  Respiratory system: Clear to auscultation. Respiratory effort normal. Cardiovascular system: S1 & S2 heard, RRR. No JVD, murmurs, rubs, gallops or clicks. No pedal edema. Gastrointestinal system: Abdomen is nondistended, soft  No organomegaly or masses felt. Normal bowel sounds heard. Mild left Lower quadrant tenderness.  Central nervous system: Alert and oriented. No focal neurological deficits. Extremities: Symmetric 5 x 5 power. Skin: No rashes, lesions or ulcers     Data Reviewed: I have personally reviewed following labs and imaging studies  CBC:  Recent Labs Lab 10/21/15 1945 10/22/15 1213  WBC 12.4* 6.4  HGB 16.1 14.0  HCT 44.6 39.9  MCV 87.6  89.3  PLT 215 0000000   Basic Metabolic Panel:  Recent Labs Lab 10/21/15 1945  NA 136  K 4.2  CL 101  CO2 26  GLUCOSE 103*  BUN 17  CREATININE 1.16  CALCIUM 9.6   GFR: Estimated Creatinine Clearance: 86.6 mL/min (by C-G formula based on Cr of 1.16). Liver Function Tests:  Recent Labs Lab 10/21/15 1945  AST 23  ALT 32  ALKPHOS 45  BILITOT 1.4*  PROT 7.7  ALBUMIN 4.8    Recent Labs Lab 10/21/15 1945  LIPASE 27   No results for input(s): AMMONIA in  the last 168 hours. Coagulation Profile: No results for input(s): INR, PROTIME in the last 168 hours. Cardiac Enzymes: No results for input(s): CKTOTAL, CKMB, CKMBINDEX, TROPONINI in the last 168 hours. BNP (last 3 results) No results for input(s): PROBNP in the last 8760 hours. HbA1C: No results for input(s): HGBA1C in the last 72 hours. CBG:  Recent Labs Lab 10/22/15 0755  GLUCAP 109*   Lipid Profile: No results for input(s): CHOL, HDL, LDLCALC, TRIG, CHOLHDL, LDLDIRECT in the last 72 hours. Thyroid Function Tests: No results for input(s): TSH, T4TOTAL, FREET4, T3FREE, THYROIDAB in the last 72 hours. Anemia Panel: No results for input(s): VITAMINB12, FOLATE, FERRITIN, TIBC, IRON, RETICCTPCT in the last 72 hours. Urine analysis:    Component Value Date/Time   COLORURINE YELLOW 10/21/2015 2025   APPEARANCEUR CLEAR 10/21/2015 2025   LABSPEC 1.015 10/21/2015 2025   PHURINE 6.5 10/21/2015 2025   GLUCOSEU NEGATIVE 10/21/2015 2025   HGBUR NEGATIVE 10/21/2015 2025   BILIRUBINUR NEGATIVE 10/21/2015 2025   KETONESUR NEGATIVE 10/21/2015 2025   PROTEINUR NEGATIVE 10/21/2015 2025   UROBILINOGEN 0.2 02/14/2013 1932   NITRITE NEGATIVE 10/21/2015 2025   LEUKOCYTESUR NEGATIVE 10/21/2015 2025   Sepsis Labs:  Recent Labs Lab 10/22/15 0008  PROCALCITON <0.10    No results found for this or any previous visit (from the past 240 hour(s)).       Radiology Studies: Ct Abdomen Pelvis W Contrast  10/21/2015  CLINICAL DATA:  55 year old male with history of diverticulitis presenting with abdominal pain. EXAM: CT ABDOMEN AND PELVIS WITH CONTRAST TECHNIQUE: Multidetector CT imaging of the abdomen and pelvis was performed using the standard protocol following bolus administration of intravenous contrast. CONTRAST:  19mL ISOVUE-300 IOPAMIDOL (ISOVUE-300) INJECTION 61% COMPARISON:  Abdominal CT dated 02/14/2013 FINDINGS: The visualized lung bases are clear. No intra-abdominal free air or  free fluid noted. Multiple small gallstones. No pericholecystic fluid. The liver, pancreas, spleen, adrenal glands, kidneys, visualized ureters, and urinary bladder appear unremarkable. The prostate and seminal vesicles are grossly unremarkable. There are small scattered colonic diverticula. There is inflammatory changes and circumferential thickening of a 6 cm segment of the distal descending colon. Multiple small diverticula noted in this region on the prior CT. This focal area of inflammatory changes most likely represents diverticulitis and less likely focal colitis. Underlying mass is less likely but not excluded. Clinical correlation and follow-up recommended. No drainable fluid collection/abscess or evidence of perforation. There is no evidence of bowel obstruction. Normal appendix. There is mild aortoiliac atherosclerotic disease. The origins of the celiac axis, SMA, IMA as well as the origins of the renal arteries are patent. The SMV, splenic vein, and main portal veins are patent. No portal venous gas identified. There is no adenopathy. The abdominal wall soft tissues appear unremarkable. There is degenerative changes of the spine. No acute fracture. IMPRESSION: Acute diverticulitis versus less likely focal segmental colitis of the distal  descending colon. No abscess. Follow-up recommended to exclude underlying mass. Cholelithiasis. Electronically Signed   By: Anner Crete M.D.   On: 10/21/2015 21:42        Scheduled Meds: . cefTRIAXone (ROCEPHIN)  IV  2 g Intravenous QHS  . enoxaparin (LOVENOX) injection  40 mg Subcutaneous QHS  . metronidazole  500 mg Intravenous Q8H   Continuous Infusions: . sodium chloride 100 mL/hr at 10/22/15 1252        Time spent: 35 minutes.     Elmarie Shiley, MD [facility] Pager [phone]  If 7PM-7AM, please contact night-coverage [email or web address] Password [email or web address] 10/22/2015, 2:59 PM

## 2015-10-22 NOTE — Consult Note (Signed)
Referring Provider: No ref. provider found Primary Care Physician:  Mauricio Po, [place] Primary Gastroenterologist:  Dr. Deatra Ina  Reason for Consultation:  Diverticulitis   HPI: Harry Carroll is a 55 y.o. male with medical history significant for recurrent diverticulitis status post partial colectomy in July 2013, OSA, hyperlipidemia, and questionable bipolar disorder who presents to the ED with <1 day of fevers and left lower quadrant abdominal pain reminiscent of his prior diverticulitis symptoms. Patient reports waking the morning of 10/21/2015 in his usual state of health, but noted some subjective fever and left lower quadrant abdominal pain while at work. His symptoms gradually worsened throughout the day. His wife called his gastroenterologist office and it was advised that he start a course of Augmentin and clear liquid diet. He was advised to report to the emergency department if his symptoms worsened or if he was unable to tolerate oral intake. He took his first dose of Augmentin, but his symptoms continued to progress and he came in for evaluation.  CT scan showed acute diverticulitis in the distal descending colon.  Had a mild leukocytosis.  Was given a does of Unasyn and Flagyl IV in the ED and is now on Rocephin and Flagyl.  Tolerating clear liquids.  Last colonoscopy 03/2014 by Dr. Deatra Ina showed diverticulosis and a 2 mm polyp in the ascending colon that was tubular adenoma on pathology; repeat recommended in 5 years from that time.  Past Medical History  Diagnosis Date  . Colon polyps     adenomatous  . Diverticulitis   . Hyperlipemia   . Insomnia   . Arthritis   . Heart murmur   . Restless leg   . Sleep apnea   . Bipolar 1 disorder (HCC)     Dr Clovis Pu   . Normal cardiac stress test     CP 01-2009---Nl ECHO and stress test neg  . Carpal tunnel syndrome     saw Dr Kristie Cowman     Past Surgical History  Procedure Laterality Date  . Maxillofacial surgery    . Right  shoulder surgery      X2  . Right knee arthroscopy      2007  . Shoulder surgery      LEFT  . Partial colectomy  12/20/2011    Procedure: PARTIAL COLECTOMY;  Surgeon: Earnstine Regal, MD;  Location: [facility];  Service: General;  Laterality: N/A;  Sigmoid colectomy  . Cystoscopy with urethral dilatation  12/20/2011    Procedure: CYSTOSCOPY WITH URETHRAL DILATATION;  Surgeon: Earnstine Regal, MD;  Location: [facility];  Service: General;;  with foley insertion  . Cystoscopy with urethral dilatation  12/20/2011    Procedure: CYSTOSCOPY WITH URETHRAL DILATATION;  Surgeon: Franchot Gallo, MD;  Location: [facility];  Service: Urology;;  . Colonoscopy with propofol N/A 04/02/2014    Procedure: COLONOSCOPY WITH PROPOFOL;  Surgeon: Inda Castle, MD;  Location: [facility];  Service: Endoscopy;  Laterality: N/A;  . Hot hemostasis N/A 04/02/2014    Procedure: HOT HEMOSTASIS (ARGON PLASMA COAGULATION/BICAP);  Surgeon: Inda Castle, MD;  Location: [facility];  Service: Endoscopy;  Laterality: N/A;    Prior to Admission medications   Medication Sig Start Date End Date Taking? Authorizing Provider  amoxicillin-clavulanate (AUGMENTIN) 875-125 MG tablet Take 1 tablet by mouth 2 (two) times daily. 10/21/15  Yes Manus Gunning, MD  traMADol (ULTRAM) 50 MG tablet Take 50 mg by mouth every 6 (six) hours as needed for moderate pain or severe  pain.   Yes Historical Provider, MD  divalproex (DEPAKOTE ER) 250 MG 24 hr tablet Take 1 tablet (250 mg total) by mouth every evening. Patient not taking: Reported on 10/21/2015 08/18/15   Charlcie Cradle, MD  pravastatin (PRAVACHOL) 20 MG tablet TAKE 2 TABLETS (40 MG TOTAL) BY MOUTH DAILY. Patient not taking: Reported on 10/21/2015 05/27/15   Golden Circle, FNP    Current Facility-Administered Medications  Medication Dose Route Frequency Provider Last Rate Last Dose  . 0.9 %  sodium chloride infusion   Intravenous Continuous Belkys A Regalado, MD 100 mL/hr at 10/22/15 1252     . acetaminophen (TYLENOL) tablet 650 mg  650 mg Oral Q6H PRN Vianne Bulls, MD   650 mg at 10/22/15 1121   Or  . acetaminophen (TYLENOL) suppository 650 mg  650 mg Rectal Q6H PRN Vianne Bulls, MD      . cefTRIAXone (ROCEPHIN) 2 g in dextrose 5 % 50 mL IVPB  2 g Intravenous QHS Vianne Bulls, MD   2 g at 10/22/15 0101  . enoxaparin (LOVENOX) injection 40 mg  40 mg Subcutaneous QHS Vianne Bulls, MD   40 mg at 10/22/15 0102  . metroNIDAZOLE (FLAGYL) IVPB 500 mg  500 mg Intravenous Q8H Vianne Bulls, MD   500 mg at 10/22/15 0735  . ondansetron (ZOFRAN) tablet 4 mg  4 mg Oral Q6H PRN Vianne Bulls, MD       Or  . ondansetron (ZOFRAN) injection 4 mg  4 mg Intravenous Q6H PRN Vianne Bulls, MD      . traMADol (ULTRAM) tablet 50 mg  50 mg Oral Q6H PRN Vianne Bulls, MD        Allergies as of 10/21/2015 - Review Complete 10/21/2015  Allergen Reaction Noted  . Ciprofloxacin Shortness Of Breath, Nausea And Vomiting, Nausea Only, Rash, and Other (See Comments) 11/23/2011  . Dilaudid [hydromorphone hcl] Shortness Of Breath and Nausea Only 11/23/2011  . Hydrocodone Itching     Family History  Problem Relation Age of Onset  . Prostate cancer Paternal Grandfather   . Cancer Paternal Aunt     lymph cancer  . Colon cancer Neg Hx   . CAD Neg Hx   . Pancreatic cancer Neg Hx   . Rectal cancer Neg Hx   . Stomach cancer Neg Hx   . Drug abuse Neg Hx   . Anxiety disorder Neg Hx   . Bipolar disorder Neg Hx   . Depression Neg Hx   . Alcohol abuse Father   . Alcohol abuse Sister     Social History   Social History  . Marital Status: Married    Spouse Name: N/A  . Number of Children: 4  . 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 Smoking status: Current Some Day Smoker    Types: Cigarettes  . Smokeless tobacco: Current User    Types: Snuff  . Alcohol Use: Yes     Comment:  a couple beers a few times a week    . Drug Use: No     Comment: denies THC, cocainse, stimulants, pain meds, benzos, synthetic drugs  . Sexual Activity: No   Other Topics Concern  . Not on file   Social History Narrative   Born in [place] and grew up in [place]. Currently resides in a house with his wife. 1  dog, 1 snake, 1 cat. 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    Denies religious beliefs that would effect health care.        Review of Systems: Ten point ROS is O/W negative except as mentioned in HPI.  Physical Exam: Vital signs in last 24 hours: Temp:  [99 F (37.2 C)-99.8 F (37.7 C)] 99 F (37.2 C) (05/11 0027) Pulse Rate:  [82-115] 82 (05/11 0027) Resp:  [16-18] 16 (05/11 0027) BP: (128-148)/(83-97) 133/87 mmHg (05/11 0027) SpO2:  [97 %-98 %] 98 % (05/11 0027) Weight:  [235 lb (106.595 kg)-240 lb 11.2 oz (109.181 kg)] 240 lb 11.2 oz (109.181 kg) (05/11 0027) Last BM Date: 10/21/15 General:  Alert, Well-developed, well-nourished, pleasant and cooperative in NAD Head:  Normocephalic and atraumatic. Eyes:  Sclera clear, no icterus.  Conjunctiva pink. Ears:  Normal auditory acuity. Mouth:  No deformity or lesions.   Lungs:  Clear throughout to auscultation.  No wheezes, crackles, or rhonchi.  Heart:  Regular rate and rhythm; no murmurs, clicks, rubs,  or gallops. Abdomen:  Soft, non-distended.  BS present.  Mild LLQ TTP.   Rectal:  Deferred  Msk:  Symmetrical without gross deformities. Pulses:  Normal pulses noted. Extremities:  Without clubbing or edema. Neurologic:  Alert and oriented x 4;  grossly normal neurologically. Skin:  Intact without significant lesions or rashes. Psych:  Alert and cooperative. Normal mood and affect.  Intake/Output from previous day: 05/10 0701 - 05/11 0700 In: 498.3 [I.V.:498.3] Out: 1250 [Urine:1250] Intake/Output this shift: Total I/O In: -  Out: 1050 [Urine:1050]  Lab Results:  Recent Labs  10/21/15 1945 10/22/15 1213  WBC 12.4* 6.4  HGB 16.1 14.0    HCT 44.6 39.9  PLT 215 175   BMET  Recent Labs  10/21/15 1945  NA 136  K 4.2  CL 101  CO2 26  GLUCOSE 103*  BUN 17  CREATININE 1.16  CALCIUM 9.6   LFT  Recent Labs  10/21/15 1945  PROT 7.7  ALBUMIN 4.8  AST 23  ALT 32  ALKPHOS 45  BILITOT 1.4*   Studies/Results: Ct Abdomen Pelvis W Contrast  10/21/2015  CLINICAL DATA:  55 year old male with history of diverticulitis presenting with abdominal pain. EXAM: CT ABDOMEN AND PELVIS WITH CONTRAST TECHNIQUE: Multidetector CT imaging of the abdomen and pelvis was performed using the standard protocol following bolus administration of intravenous contrast. CONTRAST:  156mL ISOVUE-300 IOPAMIDOL (ISOVUE-300) INJECTION 61% COMPARISON:  Abdominal CT dated 02/14/2013 FINDINGS: The visualized lung bases are clear. No intra-abdominal free air or free fluid noted. Multiple small gallstones. No pericholecystic fluid. The liver, pancreas, spleen, adrenal glands, kidneys, visualized ureters, and urinary bladder appear unremarkable. The prostate and seminal vesicles are grossly unremarkable. There are small scattered colonic diverticula. There is inflammatory changes and circumferential thickening of a 6 cm segment of the distal descending colon. Multiple small diverticula noted in this region on the prior CT. This focal area of inflammatory changes most likely represents diverticulitis and less likely focal colitis. Underlying mass is less likely but not excluded. Clinical correlation and follow-up recommended. No drainable fluid collection/abscess or evidence of perforation. There is no evidence of bowel obstruction. Normal appendix. There is mild aortoiliac atherosclerotic disease. The origins of the celiac axis, SMA, IMA as well as the origins of the renal arteries are patent. The SMV, splenic vein, and main portal veins are patent. No portal venous gas identified. There is no adenopathy. The abdominal wall soft tissues appear unremarkable. There  is  degenerative changes of the spine. No acute fracture. IMPRESSION: Acute diverticulitis versus less likely focal segmental colitis of the distal descending colon. No abscess. Follow-up recommended to exclude underlying mass. Cholelithiasis. Electronically Signed   By: Anner Crete M.D.   On: 10/21/2015 21:42   IMPRESSION:  -Uncomplicated diverticulitis in the distal descending colon:  Improving on IV abx.  Has history of recurrent diverticulitis with surgery in 12/2011.  PLAN: -Continue Rocephin and Flagyl IV for now and transition back to PO Augmentin on D/C. -Will allow full liquid diet today and if tolerates this then low residue diet and discharge tomorrow.  Signing off from a GI standpoint.   Geneal Huebert D.  10/22/2015, 2:15 PM  Pager number [phone]

## 2015-10-22 NOTE — Progress Notes (Signed)
Patient refuses CPAP at this time. Encouraged patient to call if he changes his mind.RT will continue to monitor.

## 2015-10-23 DIAGNOSIS — K5792 Diverticulitis of intestine, part unspecified, without perforation or abscess without bleeding: Secondary | ICD-10-CM | POA: Diagnosis not present

## 2015-10-23 LAB — BASIC METABOLIC PANEL
ANION GAP: 12 (ref 5–15)
BUN: 9 mg/dL (ref 6–20)
CALCIUM: 8.7 mg/dL — AB (ref 8.9–10.3)
CHLORIDE: 103 mmol/L (ref 101–111)
CO2: 23 mmol/L (ref 22–32)
CREATININE: 0.84 mg/dL (ref 0.61–1.24)
GFR calc Af Amer: >60 mL/min (ref >60)
GFR calc non Af Amer: >60 mL/min (ref >60)
GLUCOSE: 110 mg/dL — AB (ref 65–99)
Potassium: 4 mmol/L (ref 3.5–5.1)
Sodium: 138 mmol/L (ref 135–145)

## 2015-10-23 LAB — GLUCOSE, CAPILLARY: Glucose-Capillary: 95 mg/dL (ref 65–99)

## 2015-10-23 LAB — CBC
HEMATOCRIT: 40.2 % (ref 39.0–52.0)
HEMOGLOBIN: 14.1 g/dL (ref 13.0–17.0)
MCH: 31.3 pg (ref 26.0–34.0)
MCHC: 35.1 g/dL (ref 30.0–36.0)
MCV: 89.1 fL (ref 78.0–100.0)
Platelets: 175 10*3/uL (ref 150–400)
RBC: 4.51 MIL/uL (ref 4.22–5.81)
RDW: 13.1 % (ref 11.5–15.5)
WBC: 5.5 10*3/uL (ref 4.0–10.5)

## 2015-10-23 LAB — C DIFFICILE QUICK SCREEN W PCR REFLEX
C DIFFICILE (CDIFF) TOXIN: NEGATIVE
C DIFFICLE (CDIFF) ANTIGEN: NEGATIVE
C Diff interpretation: NEGATIVE

## 2015-10-23 LAB — PROCALCITONIN

## 2015-10-23 MED ORDER — METRONIDAZOLE 500 MG PO TABS: 500.0000 mg | ORAL_TABLET | Freq: Three times a day (TID) | ORAL | Status: DC

## 2015-10-23 NOTE — Plan of Care (Signed)
Problem: Food- and Nutrition-Related Knowledge Deficit (NB-1.1) Goal: Nutrition education Formal process to instruct or train a patient/client in a skill or to impart knowledge to help patients/clients voluntarily manage or modify food choices and eating behavior to maintain or improve health. Outcome: Completed/Met Date Met:  10/23/15 Nutrition Education Note  RD consulted for nutrition education regarding a low fiber diet for diverticulitis.  RD provided "Fiber Restricted Nutrition Therapy" handout from the Academy of [employer]etics. Reviewed patient's dietary recall. Provided examples of low and high fiber foods. Discouraged intake of high fiber foods, processed foods, caffeine and red meats. Encouraged use of a multi-vitamin while following a low fiber diet. Teach back method used.  Expect fair compliance.  Body mass index is 35.53 kg/(m^2). Pt meets criteria for obese class II based on current BMI.  Current diet order is Soft, patient is consuming approximately 100% of meals at this time. Labs and medications reviewed. No further nutrition interventions warranted at this time. RD contact information provided. If additional nutrition issues arise, please re-consult RD.  Satira Anis. Kandace Elrod, MS, RD LDN Inpatient Clinical Dietitian Pager [phone]

## 2015-10-23 NOTE — Discharge Summary (Signed)
Physician Discharge Summary  Harry Carroll [id number] DOB: 11-01-1960 DOA: 10/21/2015  PCP: Mauricio Po, FNP  Admit date: 10/21/2015 Discharge date: 10/23/2015  Time spent: 35 minutes  Recommendations for Outpatient Follow-up:  Follow up with GI for colonoscopy   Discharge Diagnoses:    Acute diverticulitis   Hyperlipidemia   BIPOLAR DISORDER UNSPECIFIED   OSA (obstructive sleep apnea)   Discharge Condition: stable  Diet recommendation: low fiber diet  Filed Weights   10/21/15 1925 10/22/15 0027  Weight: 106.595 kg (235 lb) 109.181 kg (240 lb 11.2 oz)    History of present illness:  Harry Carroll is a 55 y.o. male with medical history significant for recurrent diverticulitis status post partial colectomy in July 2013, OSA, hyperlipidemia, and questionable bipolar disorder who presents to the ED with 1 day of fevers and left lower quadrant abdominal pain reminiscent of his prior diverticulitis symptoms. Patient reports waking the morning of 10/21/2015 in his usual state of health, but noted some subjective fever and left lower quadrant abdominal pain while at work. His symptoms gradually worsened throughout the day and by the time he arrived home, patient reports shaking with chills. His wife called his gastroenterologist office and it was advised that he start a course of Augmentin and clear liquid diet. He was advised to report to the emergency department if his symptoms worsened or if he was unable to tolerate oral intake. He took his first dose of Augmentin this evening, but his symptoms continued to progress and he came in for evaluation. He denies any nausea or vomiting and denies diarrhea.   ED Course: Upon arrival to the ED, patient is found to have a temperature 37.7 C, saturate well on room air, and with remaining vitals stable. CMP was notable for a mild elevation in total bilirubin and CBC features a leukocytosis to 12,400. Urine was obtained for analysis and grossly  negative for infection. CT of the abdomen and pelvis was obtained and features inflammatory changes at the distal descending colon and a region which featured several diverticuli on the prior scan and is felt to be most consistent with acute diverticulitis. There is no abscess or evidence of perforation. Patient was started on empiric Unasyn and Flagyl in the emergency department and symptomatic management was provided in the form of IV morphine and Zofran. Patient remained hemodynamically stable in the ED and will be admitted to the hospital for ongoing evaluation and management of acute diverticulitis.  Hospital Course:  1-Diverticulitis. Received ceftriaxone and flagyl for 2 days Tolerating low residue diet.  Informed GI of patient admission.  CT with diverticulitis vs colitis. Needs repeat CT scan at some point to rule out underline mass.  IV fluids.  Had watery stool today, has prior history of c diff. C diff negative this admission.  He will resume Augmentin and I will provide flagyl for 7 days.  Will likely need repeat colonoscopy. GU with GI   2. OSA  - CPAP qHS   3. Bipolar disorder  - Previously on Lamictal and Depakote - Following with psychiatry outpt and has been taken off both medications   Procedures:  none  Consultations:  GI  Discharge Exam: Filed Vitals:   10/22/15 2158 10/23/15 0532  BP: 123/76 115/78  Pulse: 72 69  Temp: 99.6 F (37.6 C) 98 F (36.7 C)  Resp: 18 18    General: NAD Cardiovascular: S 1, S 2 RRR Respiratory: CTA  Discharge Instructions   Discharge Instructions  Diet - low sodium heart healthy    Complete by:  As directed      Increase activity slowly    Complete by:  As directed           Current Discharge Medication List    START taking these medications   Details  metroNIDAZOLE (FLAGYL) 500 MG tablet Take 1 tablet (500 mg total) by mouth 3 (three) times daily. Qty: 21 tablet, Refills: 0      CONTINUE these  medications which have NOT CHANGED   Details  amoxicillin-clavulanate (AUGMENTIN) 875-125 MG tablet Take 1 tablet by mouth 2 (two) times daily. Qty: 20 tablet, Refills: 0   Associated Diagnoses: Diverticulitis of colon    traMADol (ULTRAM) 50 MG tablet Take 50 mg by mouth every 6 (six) hours as needed for moderate pain or severe pain.    pravastatin (PRAVACHOL) 20 MG tablet TAKE 2 TABLETS (40 MG TOTAL) BY MOUTH DAILY. Qty: 60 tablet, Refills: 11      STOP taking these medications     divalproex (DEPAKOTE ER) 250 MG 24 hr tablet        Allergies  Allergen Reactions  . Ciprofloxacin Shortness Of Breath, Nausea And Vomiting, Nausea Only, Rash and Other (See Comments)    Marked mucous production  . Dilaudid [Hydromorphone Hcl] Shortness Of Breath and Nausea Only    Patient developed rash, excessive mucous production and chest pain.  Marland Kitchen Hydrocodone Itching   Follow-up Information    Follow up with Mauricio Po, FNP In 1 week.   Specialty:  Family Medicine   Contact information:   [place] [id number] [phone]        The results of significant diagnostics from this hospitalization (including imaging, microbiology, ancillary and laboratory) are listed below for reference.    Significant Diagnostic Studies: Ct Abdomen Pelvis W Contrast  10/21/2015  CLINICAL DATA:  55 year old male with history of diverticulitis presenting with abdominal pain. EXAM: CT ABDOMEN AND PELVIS WITH CONTRAST TECHNIQUE: Multidetector CT imaging of the abdomen and pelvis was performed using the standard protocol following bolus administration of intravenous contrast. CONTRAST:  179mL ISOVUE-300 IOPAMIDOL (ISOVUE-300) INJECTION 61% COMPARISON:  Abdominal CT dated 02/14/2013 FINDINGS: The visualized lung bases are clear. No intra-abdominal free air or free fluid noted. Multiple small gallstones. No pericholecystic fluid. The liver, pancreas, spleen, adrenal glands, kidneys, visualized  ureters, and urinary bladder appear unremarkable. The prostate and seminal vesicles are grossly unremarkable. There are small scattered colonic diverticula. There is inflammatory changes and circumferential thickening of a 6 cm segment of the distal descending colon. Multiple small diverticula noted in this region on the prior CT. This focal area of inflammatory changes most likely represents diverticulitis and less likely focal colitis. Underlying mass is less likely but not excluded. Clinical correlation and follow-up recommended. No drainable fluid collection/abscess or evidence of perforation. There is no evidence of bowel obstruction. Normal appendix. There is mild aortoiliac atherosclerotic disease. The origins of the celiac axis, SMA, IMA as well as the origins of the renal arteries are patent. The SMV, splenic vein, and main portal veins are patent. No portal venous gas identified. There is no adenopathy. The abdominal wall soft tissues appear unremarkable. There is degenerative changes of the spine. No acute fracture. IMPRESSION: Acute diverticulitis versus less likely focal segmental colitis of the distal descending colon. No abscess. Follow-up recommended to exclude underlying mass. Cholelithiasis. Electronically Signed   By: Anner Crete M.D.   On: 10/21/2015 21:42  Microbiology: Recent Results (from the past 240 hour(s))  C difficile quick scan w PCR reflex     Status: None   Collection Time: 10/23/15 12:58 PM  Result Value Ref Range Status   C Diff antigen NEGATIVE NEGATIVE Final   C Diff toxin NEGATIVE NEGATIVE Final   C Diff interpretation Negative for toxigenic C. difficile  Final     Labs: Basic Metabolic Panel:  Recent Labs Lab 10/21/15 1945 10/23/15 0430  NA 136 138  K 4.2 4.0  CL 101 103  CO2 26 23  GLUCOSE 103* 110*  BUN 17 9  CREATININE 1.16 0.84  CALCIUM 9.6 8.7*   Liver Function Tests:  Recent Labs Lab 10/21/15 1945  AST 23  ALT 32  ALKPHOS 45   BILITOT 1.4*  PROT 7.7  ALBUMIN 4.8    Recent Labs Lab 10/21/15 1945  LIPASE 27   No results for input(s): AMMONIA in the last 168 hours. CBC:  Recent Labs Lab 10/21/15 1945 10/22/15 1213 10/23/15 0430  WBC 12.4* 6.4 5.5  HGB 16.1 14.0 14.1  HCT 44.6 39.9 40.2  MCV 87.6 89.3 89.1  PLT 215 175 175   Cardiac Enzymes: No results for input(s): CKTOTAL, CKMB, CKMBINDEX, TROPONINI in the last 168 hours. BNP: BNP (last 3 results) No results for input(s): BNP in the last 8760 hours.  ProBNP (last 3 results) No results for input(s): PROBNP in the last 8760 hours.  CBG:  Recent Labs Lab 10/22/15 0755 10/23/15 0804  GLUCAP 109* 95       Signed:  Niel Hummer A MD.  [facility] 10/23/2015, 3:38 PM

## 2015-11-19 ENCOUNTER — Ambulatory Visit (INDEPENDENT_AMBULATORY_CARE_PROVIDER_SITE_OTHER): Payer: 59 | Admitting: Psychiatry

## 2015-11-19 ENCOUNTER — Ambulatory Visit (HOSPITAL_COMMUNITY): Payer: Self-pay | Admitting: Psychiatry

## 2015-11-19 ENCOUNTER — Encounter (HOSPITAL_COMMUNITY): Payer: Self-pay | Admitting: Psychiatry

## 2015-11-19 VITALS — BP 126/80 | HR 93 | Ht 69.0 in | Wt 243.4 lb

## 2015-11-19 DIAGNOSIS — F3170 Bipolar disorder, currently in remission, most recent episode unspecified: Secondary | ICD-10-CM

## 2015-11-19 DIAGNOSIS — F319 Bipolar disorder, unspecified: Secondary | ICD-10-CM | POA: Diagnosis not present

## 2015-11-19 NOTE — Progress Notes (Signed)
Patient ID: Harry Carroll, male   DOB: [birth year], 55 y.o.   MRN: [id number] Patient ID: Harry Carroll, male   DOB: [birth year], 55 y.o.   MRN: [id number] [facility] MD/PA/NP OP Progress Note  11/19/2015 3:00 PM Harry Carroll  MRN:  [id number]  Subjective:   Pt states he is doing well. He finished Depakote about 4 weeks.  Hand tremor and tinnitus have resolved. He has more energy and is feeling better. Pt has been more productive and goal oriented.  Work and home life are good. No concerns or compliants today.   Pt denies depression. Denies anhedonia, isolation, crying spells, low motivation, poor hygiene, worthlessness and hopelessness. Denies SI/HI. Denies AVH. He is social and playing golf.   Sleep is good and he is no longer taking Ambien for last 6 months. Appetite is good and he has lost some weight due to dieting. Concentration is good.  Denies manic and hypomanic symptoms including periods of decreased need for sleep, increased energy, mood lability, impulsivity, FOI, and excessive spending.  Taking meds as prescribed and denies SE.    Chief Complaint: "really good" Chief Complaint    Follow-up     Visit Diagnosis:   No diagnosis found.  Past Medical History:  Past Medical History  Diagnosis Date  . Colon polyps     adenomatous  . Diverticulitis   . Hyperlipemia   . Insomnia   . Arthritis   . Heart murmur   . Restless leg   . Sleep apnea   . Bipolar 1 disorder (HCC)     Dr Clovis Pu   . Normal cardiac stress test     CP 01-2009---Nl ECHO and stress test neg  . Carpal tunnel syndrome     saw Dr Kristie Cowman     Past Surgical History  Procedure Laterality Date  . Maxillofacial surgery    . Right shoulder surgery      X2  . Right knee arthroscopy      2007  . Shoulder surgery      LEFT  . Partial colectomy  12/20/2011    Procedure: PARTIAL COLECTOMY;  Surgeon: Earnstine Regal, MD;  Location: [facility];  Service: General;  Laterality: N/A;  Sigmoid colectomy  . Cystoscopy with  urethral dilatation  12/20/2011    Procedure: CYSTOSCOPY WITH URETHRAL DILATATION;  Surgeon: Earnstine Regal, MD;  Location: [facility];  Service: General;;  with foley insertion  . Cystoscopy with urethral dilatation  12/20/2011    Procedure: CYSTOSCOPY WITH URETHRAL DILATATION;  Surgeon: Franchot Gallo, MD;  Location: [facility];  Service: Urology;;  . Colonoscopy with propofol N/A 04/02/2014    Procedure: COLONOSCOPY WITH PROPOFOL;  Surgeon: Inda Castle, MD;  Location: [facility];  Service: Endoscopy;  Laterality: N/A;  . Hot hemostasis N/A 04/02/2014    Procedure: HOT HEMOSTASIS (ARGON PLASMA COAGULATION/BICAP);  Surgeon: Inda Castle, MD;  Location: [facility];  Service: Endoscopy;  Laterality: N/A;   Past Psych hx: Meds: Ambien for 10 yrs takes it nightly along with CPAP, has been on Depakote and Lamictal for many years Hospitalizations: denies SIB/Suicide attempts: denies, denies family hx of suicide Denies hx of abuse.   Family History:  Family History  Problem Relation Age of Onset  . Prostate cancer Paternal Grandfather   . Cancer Paternal Aunt     lymph cancer  . Colon cancer Neg Hx   . CAD Neg Hx   . Pancreatic cancer Neg Hx   . Rectal  cancer Neg Hx   . Stomach cancer Neg Hx   . Drug abuse Neg Hx   . Anxiety disorder Neg Hx   . Bipolar disorder Neg Hx   . Depression Neg Hx   . Alcohol abuse Father   . Alcohol abuse Sister    Social History:  Social History   Social History  . Marital Status: Married    Spouse Name: N/A  . Number of Children: 4  . 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 Smoking status: Current Some Day Smoker    Types: Cigarettes  . Smokeless tobacco: Current User    Types: Snuff  . Alcohol Use: Yes     Comment:  a couple beers a few times a week   . Drug Use: No     Comment: denies THC, cocainse, stimulants, pain meds, benzos, synthetic drugs  . Sexual Activity:  No   Other Topics Concern  . None   Social History Narrative   Born in [place] and grew up in [place]. Currently resides in a house with his wife. 1 dog, 1 snake, 1 cat. 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    Denies religious beliefs that would effect health care.        Musculoskeletal: Strength & Muscle Tone: within normal limits Gait & Station: normal Patient leans: straight  Psychiatric Specialty Exam: HPI  Review of Systems  Constitutional: Negative for fever and chills.  HENT: Negative for nosebleeds, sore throat and tinnitus.   Eyes: Negative for blurred vision, double vision and pain.  Respiratory: Negative for cough, shortness of breath and wheezing.   Cardiovascular: Negative for chest pain, palpitations and leg swelling.  Gastrointestinal: Negative for heartburn, nausea, vomiting and abdominal pain.  Musculoskeletal: Negative for back pain, joint pain, falls and neck pain.  Skin: Negative for itching and rash.  Neurological: Negative for dizziness, tremors, seizures, loss of consciousness, weakness and headaches.  Psychiatric/Behavioral: Negative for depression, suicidal ideas, hallucinations and substance abuse. The patient is not nervous/anxious and does not have insomnia.     Blood pressure 126/80, pulse 93, height 5\' 9"  (1.753 m), weight 243 lb 6.4 oz (110.406 kg).Body mass index is 35.93 kg/(m^2).  General Appearance: Fairly Groomed  Eye Contact:  Good  Speech:  Clear and Coherent and Normal Rate  Volume:  Normal  Mood:  Euthymic  Affect:  Full Range  Thought Process:  Goal Directed  Orientation:  Full (Time, Place, and Person)  Thought Content:  Negative  Suicidal Thoughts:  No  Homicidal Thoughts:  No  Memory:  Immediate;   Good Recent;   Good Remote;   Good  Judgement:  Good  Insight:  Good  Psychomotor Activity:  Normal  Concentration:  Good  Recall:  Good  Fund of Knowledge: Good  Language: Good  Akathisia:  No  Handed:  Right  AIMS (if  indicated):  n/a  Assets:  Communication Skills Desire for Improvement Financial Resources/Insurance Housing Intimacy Leisure Time Physical Health Resilience Social Support Talents/Skills Transportation Vocational/Educational  ADL's:  Intact  Cognition: WNL  Sleep:  good   Is the patient at risk to self?  No. Has the patient been a risk to self in the past 6 months?  No. Has the patient been a risk to self within the distant past?  No. Is the patient a risk to others?  No. Has the patient  been a risk to others in the past 6 months?  No. Has the patient been a risk to others within the distant past?  No.  Current Medications: Current Outpatient Prescriptions  Medication Sig Dispense Refill  . amoxicillin-clavulanate (AUGMENTIN) 875-125 MG tablet Take 1 tablet by mouth 2 (two) times daily. (Patient not taking: Reported on 11/19/2015) 20 tablet 0  . metroNIDAZOLE (FLAGYL) 500 MG tablet Take 1 tablet (500 mg total) by mouth 3 (three) times daily. (Patient not taking: Reported on 11/19/2015) 21 tablet 0  . pravastatin (PRAVACHOL) 20 MG tablet TAKE 2 TABLETS (40 MG TOTAL) BY MOUTH DAILY. (Patient not taking: Reported on 11/19/2015) 60 tablet 11  . traMADol (ULTRAM) 50 MG tablet Take 50 mg by mouth every 6 (six) hours as needed for moderate pain or severe pain. Reported on 11/19/2015     No current facility-administered medications for this visit.    Medical Decision Making:  Established Problem, Stable/Improving (1), Review of Psycho-Social Stressors (1), Review of Medication Regimen & Side Effects (2) and Review of New Medication or Change in Dosage (2)     Assessment: Bipolar I  Treatment Plan Summary:Medication management and Plan see below Plan of Care: Medication management with supportive therapy. Risks/benefits and SE of the medication discussed. Pt verbalized understanding and verbal consent obtained for treatment. Affirm with the patient that the medications are taken as  ordered. Patient expressed understanding of how their medications were to be used.   Reviewed Dr. Jacky Kindle notes Question whether pt has Bipolar disorder and needs to be on 2 mood stabalizers. Reports stable mood and feels good. No meds prescribed today.   Laboratory: reviewed Depakote level 72, chol 255, LDL 181, CBC WNL, TSH WNL    Psychotherapy: Therapy: brief supportive therapy provided. Discussed psychosocial stressors in detail.    Medications:d/c Depakote    Pt is aware that symptoms could return and is monitoring his symptoms  Routine PRN Medications: Yes  Consultations: None at this time  Safety Concerns: Pt denies SI and is at an acute low risk for suicide.Patient told to call clinic if any problems occur. Patient advised to go to ER if they should develop SI/HI, side effects, or if symptoms worsen. Has crisis numbers to call if needed. Pt verbalized understanding.   Other: F/up in 4 months or sooner if needed              Charlcie Cradle 11/19/2015, 3:00 PM

## 2015-12-21 ENCOUNTER — Telehealth: Payer: Self-pay | Admitting: Family

## 2015-12-21 DIAGNOSIS — R200 Anesthesia of skin: Secondary | ICD-10-CM

## 2015-12-21 DIAGNOSIS — R202 Paresthesia of skin: Principal | ICD-10-CM

## 2015-12-21 NOTE — Telephone Encounter (Signed)
Please advise 

## 2015-12-21 NOTE — Telephone Encounter (Signed)
Patient called in to advise that he is still experiencing numbness and tingling in his hand. He states that it has been pretty painful over the last few week. He is asking if we should go ahead and do a referral to a specialist.

## 2015-12-22 NOTE — Telephone Encounter (Signed)
I have placed orders for him to have imaging done given no improvement of his symptoms with conservative treatment. The first is a basic x-ray to be done at his convenience. If this is normal we will consider an MRI and possible referral to neurosurgery or orthopedics.

## 2015-12-23 NOTE — Telephone Encounter (Signed)
LVM letting pt know.  

## 2015-12-25 ENCOUNTER — Ambulatory Visit (INDEPENDENT_AMBULATORY_CARE_PROVIDER_SITE_OTHER): Payer: 59 | Admitting: Family

## 2015-12-25 ENCOUNTER — Encounter: Payer: Self-pay | Admitting: Family

## 2015-12-25 VITALS — BP 124/62 | HR 104 | Temp 98.3°F | Resp 16 | Ht 69.0 in | Wt 246.0 lb

## 2015-12-25 DIAGNOSIS — M25532 Pain in left wrist: Secondary | ICD-10-CM | POA: Diagnosis not present

## 2015-12-25 DIAGNOSIS — M25531 Pain in right wrist: Secondary | ICD-10-CM

## 2015-12-25 MED ORDER — MELOXICAM 15 MG PO TABS: 15.0000 mg | ORAL_TABLET | Freq: Every day | ORAL | Status: DC

## 2015-12-25 NOTE — Progress Notes (Signed)
Subjective:    Patient ID: Harry Carroll, male    DOB: 1960-11-28, 55 y.o.   MRN: [id number]  Chief Complaint  Patient presents with  . Wrist Pain    states that his right wrist goes numb routinely, left wrist is extremely painful in the joint    HPI:   Harry Carroll is a 55 y.o. male who  has a past medical history of Colon polyps; Diverticulitis; Hyperlipemia; Insomnia; Arthritis; Heart murmur; Restless leg; Sleep apnea; Bipolar 1 disorder ([place]); Normal cardiac stress test; and Carpal tunnel syndrome. and presents today for an office visit.   Continues to experience the associated symptom of numbness and tingling located in his right wrist and is now experiencing pain located in the left wrist. Severity of the left wrist described as similar to a migrane headache. Modifying factors include a brace and tumeric which have not helped very much. 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   Allergies  Allergen Reactions  . Ciprofloxacin Shortness Of Breath, Nausea And Vomiting, Nausea Only, Rash and Other (See Comments)    Marked mucous production  . Dilaudid [Hydromorphone Hcl] Shortness Of Breath and Nausea Only    Patient developed rash, excessive mucous production and chest pain.  Marland Kitchen Hydrocodone Itching     Current Outpatient Prescriptions on File Prior to Visit  Medication Sig Dispense Refill  . pravastatin (PRAVACHOL) 20 MG tablet TAKE 2 TABLETS (40 MG TOTAL) BY MOUTH DAILY. 60 tablet 11  . traMADol (ULTRAM) 50 MG tablet Take 50 mg by mouth every 6 (six) hours as needed for moderate pain or severe pain. Reported on 11/19/2015     No current facility-administered medications on file prior to visit.     Past Surgical History  Procedure Laterality Date  . Maxillofacial surgery    . Right shoulder surgery      X2  . Right knee arthroscopy      2007  . Shoulder surgery      LEFT  .  Partial colectomy  12/20/2011    Procedure: PARTIAL COLECTOMY;  Surgeon: Earnstine Regal, MD;  Location: [facility];  Service: General;  Laterality: N/A;  Sigmoid colectomy  . Cystoscopy with urethral dilatation  12/20/2011    Procedure: CYSTOSCOPY WITH URETHRAL DILATATION;  Surgeon: Earnstine Regal, MD;  Location: [facility];  Service: General;;  with foley insertion  . Cystoscopy with urethral dilatation  12/20/2011    Procedure: CYSTOSCOPY WITH URETHRAL DILATATION;  Surgeon: Franchot Gallo, MD;  Location: [facility];  Service: Urology;;  . Colonoscopy with propofol N/A 04/02/2014    Procedure: COLONOSCOPY WITH PROPOFOL;  Surgeon: Inda Castle, MD;  Location: [facility];  Service: Endoscopy;  Laterality: N/A;  . Hot hemostasis N/A 04/02/2014    Procedure: HOT HEMOSTASIS (ARGON PLASMA COAGULATION/BICAP);  Surgeon: Inda Castle, MD;  Location: [facility];  Service: Endoscopy;  Laterality: N/A;      Review of Systems  Constitutional: Negative for fever and chills.  Musculoskeletal:       Positive for bilateral wrist pain.  Neurological: Positive for numbness. Negative for weakness.      Objective:    BP 124/62 mmHg  Pulse 104  Temp(Src) 98.3 F (36.8 C) (Oral)  Resp 16  Ht 5\' 9"  (1.753 m)  Wt 246 lb (111.585 kg)  BMI 36.31 kg/m2  SpO2 95% Nursing note and vital  signs reviewed.  Physical Exam  Constitutional: He is oriented to person, place, and time. He appears well-developed and well-nourished. No distress.  Cardiovascular: Normal rate, regular rhythm, normal heart sounds and intact distal pulses.   Pulmonary/Chest: Effort normal and breath sounds normal.  Musculoskeletal:  Bilateral wrists - no obvious deformity, discoloration, or edema. Palpable left wrist tenderness over the dorsal aspect of the proximal wrist. There is no tenderness of the right wrist. Numbness and tingling able to be re-created in the right wrist. Bilateral wrists with normal range of motion and strength. Pulses  are intact and appropriate. Positive Phalen's test on the right. Ligaments appear intact and appropriate.  Neurological: He is alert and oriented to person, place, and time.  Skin: Skin is warm and dry.  Psychiatric: He has a normal mood and affect. His behavior is normal. Judgment and thought content normal.       Assessment & Plan:   Problem List Items Addressed This Visit      Other   Bilateral wrist pain - Primary    Bilateral wrist pain has been Refractory to conservative treatments with bracing and over-the-counter medications. Right wrist is consistent with carpal tunnel syndrome most likely related to repetitive activity. Left wrist with possible osteoarthritis with no significant ligamentous or muscular findings. Continue risk bracing. Start meloxicam. Sample of Pennsaid provided. Referral to orthopedics placed for possible cortisone injections. Follow-up if symptoms worsen or do not improve.      Relevant Medications   meloxicam (MOBIC) 15 MG tablet   Other Relevant Orders   AMB referral to orthopedics       I am having [name] start on meloxicam. I am also having him maintain his pravastatin and traMADol.   Meds ordered this encounter  Medications  . meloxicam (MOBIC) 15 MG tablet    Sig: Take 1 tablet (15 mg total) by mouth daily.    Dispense:  30 tablet    Refill:  0    Order Specific Question:  Supervising Provider    Answer:  Pricilla Holm A [id number]     Follow-up: Return if symptoms worsen or fail to improve.  Mauricio Po, FNP

## 2015-12-25 NOTE — Assessment & Plan Note (Signed)
Bilateral wrist pain has been Refractory to conservative treatments with bracing and over-the-counter medications. Right wrist is consistent with carpal tunnel syndrome most likely related to repetitive activity. Left wrist with possible osteoarthritis with no significant ligamentous or muscular findings. Continue risk bracing. Start meloxicam. Sample of Pennsaid provided. Referral to orthopedics placed for possible cortisone injections. Follow-up if symptoms worsen or do not improve.

## 2015-12-25 NOTE — Patient Instructions (Signed)
Thank you for choosing [facility].  Summary/Instructions:  Please start the meloxicam daily.  Ice x 20 min every 2 hours as needed and after activity  They will call to schedule your appointment with orthopedics.  Your prescription(s) have been submitted to your pharmacy or been printed and provided for you. Please take as directed and contact our office if you believe you are having problem(s) with the medication(s) or have any questions.  If your symptoms worsen or fail to improve, please contact our office for further instruction, or in case of emergency go directly to the emergency room at the closest medical facility.

## 2016-01-13 ENCOUNTER — Encounter: Payer: Self-pay | Admitting: Family

## 2016-03-28 ENCOUNTER — Encounter: Payer: Self-pay | Admitting: Family

## 2016-03-28 ENCOUNTER — Ambulatory Visit (INDEPENDENT_AMBULATORY_CARE_PROVIDER_SITE_OTHER): Payer: 59 | Admitting: Family

## 2016-03-28 VITALS — BP 126/80 | HR 89 | Temp 98.3°F | Resp 16 | Ht 69.0 in | Wt 249.0 lb

## 2016-03-28 DIAGNOSIS — Z0000 Encounter for general adult medical examination without abnormal findings: Secondary | ICD-10-CM | POA: Diagnosis not present

## 2016-03-28 DIAGNOSIS — E6609 Other obesity due to excess calories: Secondary | ICD-10-CM

## 2016-03-28 DIAGNOSIS — Z1211 Encounter for screening for malignant neoplasm of colon: Secondary | ICD-10-CM

## 2016-03-28 DIAGNOSIS — Z6836 Body mass index (BMI) 36.0-36.9, adult: Secondary | ICD-10-CM

## 2016-03-28 DIAGNOSIS — Z23 Encounter for immunization: Secondary | ICD-10-CM | POA: Diagnosis not present

## 2016-03-28 DIAGNOSIS — E669 Obesity, unspecified: Secondary | ICD-10-CM

## 2016-03-28 NOTE — Assessment & Plan Note (Signed)
1) Anticipatory Guidance: Discussed importance of wearing a seatbelt while driving and not texting while driving; changing batteries in smoke detector at least once annually; wearing suntan lotion when outside; eating a balanced and moderate diet; getting physical activity at least 30 minutes per day.  2) Immunizations / Screenings / Labs:  Influenza updated today. All other immunizations are up-to-date per recommendations. Obtain hepatitis C antibody for hepatitis C screening. Obtain PSA for prostate cancer screening. Due for colon cancer screening with referral to urology placed for colonoscopy. All other screenings are up-to-date per recommendations.Obtain CBC, CMET, and lipid profile.    Overall well exam with risk factors for cardiovascular disease including obesity and hyperlipidemia. Not currently maintained on medication for hyperlipidemia/hypertriglyceridemia. Current status updated with lipid profile. Recommended weight loss of 5-10% of current body weight through nutrition and physical activity. Continue other healthy lifestyle behaviors and choices. Follow-up prevention exam in 1 year. Follow-up office visit pending blood work for chronic conditions as indicated.

## 2016-03-28 NOTE — Progress Notes (Signed)
Subjective:    Patient ID: Harry Carroll, male    DOB: 03/04/1961, 55 y.o.   MRN: [id number]  Chief Complaint  Patient presents with  . CPE    fasting    HPI:  Harry Carroll is a 55 y.o. male who presents today for an annual wellness visit.   1) Health Maintenance -   Diet - Averages about 2 meals per day consisting of a regular diet; Caffeine intake of about 2-3 cups per day.   Exercise - Limited exercise; golf on occasion   2) Preventative Exams / Immunizations:  Dental -- Up to date  Vision -- Up to date   Health Maintenance  Topic Date Due  . Hepatitis C Screening  03/18/1961  . HIV Screening  05/20/1974  . COLONOSCOPY  04/03/2015  . INFLUENZA VACCINE  01/12/2016  . TETANUS/TDAP  01/30/2020    Immunization History  Administered Date(s) Administered  . Influenza Split 03/10/2011  . Influenza,inj,Quad PF,36+ Mos 04/13/2013, 03/28/2016  . Pneumococcal Polysaccharide-23 11/24/2011  . Td 06/14/1999, 01/29/2010     Allergies  Allergen Reactions  . Ciprofloxacin Shortness Of Breath, Nausea And Vomiting, Nausea Only, Rash and Other (See Comments)    Marked mucous production  . Dilaudid [Hydromorphone Hcl] Shortness Of Breath and Nausea Only    Patient developed rash, excessive mucous production and chest pain.  Marland Kitchen Hydrocodone Itching     Outpatient Medications Prior to Visit  Medication Sig Dispense Refill  . traMADol (ULTRAM) 50 MG tablet Take 50 mg by mouth every 6 (six) hours as needed for moderate pain or severe pain. Reported on 11/19/2015    . meloxicam (MOBIC) 15 MG tablet Take 1 tablet (15 mg total) by mouth daily. 30 tablet 0  . pravastatin (PRAVACHOL) 20 MG tablet TAKE 2 TABLETS (40 MG TOTAL) BY MOUTH DAILY. (Patient not taking: Reported on 03/28/2016) 60 tablet 11   No facility-administered medications prior to visit.      Past Medical History:  Diagnosis Date  . Arthritis   . Bipolar 1 disorder (HCC)    Dr Clovis Pu   . Carpal tunnel  syndrome    saw Dr Kristie Cowman   . Colon polyps    adenomatous  . Diverticulitis   . Heart murmur   . Hyperlipemia   . Insomnia   . Normal cardiac stress test    CP 01-2009---Nl ECHO and stress test neg  . Restless leg   . Sleep apnea      Past Surgical History:  Procedure Laterality Date  . COLONOSCOPY WITH PROPOFOL N/A 04/02/2014   Procedure: COLONOSCOPY WITH PROPOFOL;  Surgeon: Inda Castle, MD;  Location: [facility];  Service: Endoscopy;  Laterality: N/A;  . CYSTOSCOPY WITH URETHRAL DILATATION  12/20/2011   Procedure: CYSTOSCOPY WITH URETHRAL DILATATION;  Surgeon: Earnstine Regal, MD;  Location: [facility];  Service: General;;  with foley insertion  . CYSTOSCOPY WITH URETHRAL DILATATION  12/20/2011   Procedure: CYSTOSCOPY WITH URETHRAL DILATATION;  Surgeon: Franchot Gallo, MD;  Location: [facility];  Service: Urology;;  . HOT HEMOSTASIS N/A 04/02/2014   Procedure: HOT HEMOSTASIS (ARGON PLASMA COAGULATION/BICAP);  Surgeon: Inda Castle, MD;  Location: [facility];  Service: Endoscopy;  Laterality: N/A;  . maxillofacial surgery    . PARTIAL COLECTOMY  12/20/2011   Procedure: PARTIAL COLECTOMY;  Surgeon: Earnstine Regal, MD;  Location: [facility];  Service: General;  Laterality: N/A;  Sigmoid colectomy  . right knee arthroscopy     2007  .  right shoulder surgery     X2  . SHOULDER SURGERY     LEFT     Family History  Problem Relation Age of Onset  . Prostate cancer Paternal Grandfather   . Cancer Paternal Aunt     lymph cancer  . Alcohol abuse Father   . Alcohol abuse Sister   . Colon cancer Neg Hx   . CAD Neg Hx   . Pancreatic cancer Neg Hx   . Rectal cancer Neg Hx   . Stomach cancer Neg Hx   . Drug abuse Neg Hx   . Anxiety disorder Neg Hx   . Bipolar disorder Neg Hx   . Depression Neg Hx      Social History   Social History  . Marital status: Married    Spouse name: N/A  . Number of children: 4  . Years of education: 14   Occupational History  . Software   .   [employer]   Social History Main Topics  . Smoking status: Current Some Day Smoker    Types: Cigarettes, Cigars  . Smokeless tobacco: Current User    Types: Snuff  . Alcohol use Yes     Comment:  a couple beers a few times a week   . Drug use: No     Comment: denies THC, cocainse, stimulants, pain meds, benzos, synthetic drugs  . Sexual activity: No   Other Topics Concern  . Not on file   Social History Narrative   Born in [place] and grew up in [place]. Currently resides in a house with his wife. 1 dog, 1 snake, 1 cat. 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    Denies religious beliefs that would effect health care.          Review of Systems  Constitutional: Denies fever, chills, fatigue, or significant weight gain/loss. HENT: Head: Denies headache or neck pain Ears: Denies changes in hearing, ringing in ears, earache, drainage Nose: Denies discharge, stuffiness, itching, nosebleed, sinus pain Throat: Denies sore throat, hoarseness, dry mouth, sores, thrush Eyes: Denies loss/changes in vision, pain, redness, blurry/double vision, flashing lights Cardiovascular: Denies chest pain/discomfort, tightness, palpitations, shortness of breath with activity, difficulty lying down, swelling, sudden awakening with shortness of breath Respiratory: Denies shortness of breath, cough, sputum production, wheezing Gastrointestinal: Denies dysphasia, heartburn, change in appetite, nausea, change in bowel habits, rectal bleeding, constipation, diarrhea, yellow skin or eyes Genitourinary: Denies frequency, urgency, burning/pain, blood in urine, incontinence, change in urinary strength. Musculoskeletal: Denies muscle/joint pain, stiffness, back pain, redness or swelling of joints, trauma Skin: Denies rashes, lumps, itching, dryness, color changes, or hair/nail changes Neurological: Denies dizziness, fainting, seizures, weakness, numbness, tingling, tremor Psychiatric - Denies nervousness,  stress, depression or memory loss Endocrine: Denies heat or cold intolerance, sweating, frequent urination, excessive thirst, changes in appetite Hematologic: Denies ease of bruising or bleeding     Objective:     BP 126/80 (BP Location: Left Arm, Patient Position: Sitting, Cuff Size: Large)   Pulse 89   Temp 98.3 F (36.8 C) (Oral)   Resp 16   Ht 5\' 9"  (1.753 m)   Wt 249 lb (112.9 kg)   SpO2 97%   BMI 36.77 kg/m  Nursing note and vital signs reviewed.  Physical Exam  Constitutional: He is oriented to person, place, and time. He appears well-developed and well-nourished.  HENT:  Head: Normocephalic.  Right Ear: Hearing, tympanic membrane, external ear and ear canal normal.  Left Ear: Hearing, tympanic  membrane, external ear and ear canal normal.  Nose: Nose normal.  Mouth/Throat: Uvula is midline, oropharynx is clear and moist and mucous membranes are normal.  Eyes: Conjunctivae and EOM are normal. Pupils are equal, round, and reactive to light.  Neck: Neck supple. No JVD present. No tracheal deviation present. No thyromegaly present.  Cardiovascular: Normal rate, regular rhythm, normal heart sounds and intact distal pulses.   Pulmonary/Chest: Effort normal and breath sounds normal.  Abdominal: Soft. Bowel sounds are normal. He exhibits no distension and no mass. There is no tenderness. There is no rebound and no guarding.  Musculoskeletal: Normal range of motion. He exhibits no edema or tenderness.  Lymphadenopathy:    He has no cervical adenopathy.  Neurological: He is alert and oriented to person, place, and time. He has normal reflexes. No cranial nerve deficit. He exhibits normal muscle tone. Coordination normal.  Skin: Skin is warm and dry.  Psychiatric: He has a normal mood and affect. His behavior is normal. Judgment and thought content normal.       Assessment & Plan:   Problem List Items Addressed This Visit      Other   Routine general medical examination at  a health care facility - Primary    1) Anticipatory Guidance: Discussed importance of wearing a seatbelt while driving and not texting while driving; changing batteries in smoke detector at least once annually; wearing suntan lotion when outside; eating a balanced and moderate diet; getting physical activity at least 30 minutes per day.  2) Immunizations / Screenings / Labs:  Influenza updated today. All other immunizations are up-to-date per recommendations. Obtain hepatitis C antibody for hepatitis C screening. Obtain PSA for prostate cancer screening. Due for colon cancer screening with referral to urology placed for colonoscopy. All other screenings are up-to-date per recommendations.Obtain CBC, CMET, and lipid profile.    Overall well exam with risk factors for cardiovascular disease including obesity and hyperlipidemia. Not currently maintained on medication for hyperlipidemia/hypertriglyceridemia. Current status updated with lipid profile. Recommended weight loss of 5-10% of current body weight through nutrition and physical activity. Continue other healthy lifestyle behaviors and choices. Follow-up prevention exam in 1 year. Follow-up office visit pending blood work for chronic conditions as indicated.      Relevant Orders   Hepatitis C antibody   CBC   Comprehensive metabolic panel   Lipid panel   PSA   Testosterone   Obesity    BMI of 36.Recommend weight loss of 5-10% of current body weight. Recommend increasing physical activity to 30 minutes of moderate level activity daily. Encourage nutritional intake that focuses on nutrient dense foods and is moderate, varied, and balanced and is low in saturated fats and processed/sugary foods. Continue to monitor.        Other Visit Diagnoses    Colon cancer screening       Relevant Orders   Ambulatory referral to Gastroenterology   Encounter for immunization       Relevant Orders   Flu Vaccine QUAD 36+ mos IM (Completed)       I  have discontinued Mr. Lacks's pravastatin and meloxicam. I am also having him maintain his traMADol.   Follow-up: Return in about 6 months (around 09/26/2016), or if symptoms worsen or fail to improve.   Mauricio Po, FNP

## 2016-03-28 NOTE — Assessment & Plan Note (Signed)
BMI of 36. Recommend weight loss of 5-10% of current body weight. Recommend increasing physical activity to 30 minutes of moderate level activity daily. Encourage nutritional intake that focuses on nutrient dense foods and is moderate, varied, and balanced and is low in saturated fats and processed/sugary foods. Continue to monitor.   

## 2016-03-28 NOTE — Patient Instructions (Addendum)
Thank you for choosing [facility].  SUMMARY AND INSTRUCTIONS:   Labs:  Please stop by the lab on the lower level of the building for your blood work. Your results will be released to [place] (or called to you) after review, usually within 72 hours after test completion. If any changes need to be made, you will be notified at that same time.  1.) The lab is open from 7:30am to 5:30 pm Monday-Friday 2.) No appointment is necessary 3.) Fasting (if needed) is 6-8 hours after food and drink; black coffee and water are okay   Imaging / Radiology:  Please stop by radiology on the basement level of the building for your x-rays. Your results will be released to [place] (or called to you) after review, usually within 72 hours after test completion. If any treatments or changes are necessary, you will be notified at that same time.  Follow up:  If your symptoms worsen or fail to improve, please contact our office for further instruction, or in case of emergency go directly to the emergency room at the closest medical facility.   Health Maintenance, Male A healthy lifestyle and preventative care can promote health and wellness.  Maintain regular health, dental, and eye exams.  Eat a healthy diet. Foods like vegetables, fruits, whole grains, low-fat dairy products, and lean protein foods contain the nutrients you need and are low in calories. Decrease your intake of foods high in solid fats, added sugars, and salt. Get information about a proper diet from your health care provider, if necessary.  Regular physical exercise is one of the most important things you can do for your health. Most adults should get at least 150 minutes of moderate-intensity exercise (any activity that increases your heart rate and causes you to sweat) each week. In addition, most adults need muscle-strengthening exercises on 2 or more days a week.   Maintain a healthy weight. The body mass index (BMI) is a  screening tool to identify possible weight problems. It provides an estimate of body fat based on height and weight. Your health care provider can find your BMI and can help you achieve or maintain a healthy weight. For males 20 years and older:  A BMI below 18.5 is considered underweight.  A BMI of 18.5 to 24.9 is normal.  A BMI of 25 to 29.9 is considered overweight.  A BMI of 30 and above is considered obese.  Maintain normal blood lipids and cholesterol by exercising and minimizing your intake of saturated fat. Eat a balanced diet with plenty of fruits and vegetables. Blood tests for lipids and cholesterol should begin at age 32 and be repeated every 5 years. If your lipid or cholesterol levels are high, you are over age 3, or you are at high risk for heart disease, you may need your cholesterol levels checked more frequently.Ongoing high lipid and cholesterol levels should be treated with medicines if diet and exercise are not working.  If you smoke, find out from your health care provider how to quit. If you do not use tobacco, do not start.  Lung cancer screening is recommended for adults aged 45-80 years who are at high risk for developing lung cancer because of a history of smoking. A yearly low-dose CT scan of the lungs is recommended for people who have at least a 30-pack-year history of smoking and are current smokers or have quit within the past 15 years. A pack year of smoking is smoking an average of  1 pack of cigarettes a day for 1 year (for example, a 30-pack-year history of smoking could mean smoking 1 pack a day for 30 years or 2 packs a day for 15 years). Yearly screening should continue until the smoker has stopped smoking for at least 15 years. Yearly screening should be stopped for people who develop a health problem that would prevent them from having lung cancer treatment.  If you choose to drink alcohol, do not have more than 2 drinks per day. One drink is considered to be  12 oz (360 mL) of beer, 5 oz (150 mL) of wine, or 1.5 oz (45 mL) of liquor.  Avoid the use of street drugs. Do not share needles with anyone. Ask for help if you need support or instructions about stopping the use of drugs.  High blood pressure causes heart disease and increases the risk of stroke. High blood pressure is more likely to develop in:  People who have blood pressure in the end of the normal range (100-139/85-89 mm Hg).  People who are overweight or obese.  People who are African American.  If you are 59-24 years of age, have your blood pressure checked every 3-5 years. If you are 21 years of age or older, have your blood pressure checked every year. You should have your blood pressure measured twice--once when you are at a hospital or clinic, and once when you are not at a hospital or clinic. Record the average of the two measurements. To check your blood pressure when you are not at a hospital or clinic, you can use:  An automated blood pressure machine at a pharmacy.  A home blood pressure monitor.  If you are 58-22 years old, ask your health care provider if you should take aspirin to prevent heart disease.  Diabetes screening involves taking a blood sample to check your fasting blood sugar level. This should be done once every 3 years after age 32 if you are at a normal weight and without risk factors for diabetes. Testing should be considered at a younger age or be carried out more frequently if you are overweight and have at least 1 risk factor for diabetes.  Colorectal cancer can be detected and often prevented. Most routine colorectal cancer screening begins at the age of 56 and continues through age 41. However, your health care provider may recommend screening at an earlier age if you have risk factors for colon cancer. On a yearly basis, your health care provider may provide home test kits to check for hidden blood in the stool. A small camera at the end of a tube may be  used to directly examine the colon (sigmoidoscopy or colonoscopy) to detect the earliest forms of colorectal cancer. Talk to your health care provider about this at age 34 when routine screening begins. A direct exam of the colon should be repeated every 5-10 years through age 76, unless early forms of precancerous polyps or small growths are found.  People who are at an increased risk for hepatitis B should be screened for this virus. You are considered at high risk for hepatitis B if:  You were born in a country where hepatitis B occurs often. Talk with your health care provider about which countries are considered high risk.  Your parents were born in a high-risk country and you have not received a shot to protect against hepatitis B (hepatitis B vaccine).  You have HIV or AIDS.  You use needles to inject  street drugs.  You live with, or have sex with, someone who has hepatitis B.  You are a man who has sex with other men (MSM).  You get hemodialysis treatment.  You take certain medicines for conditions like cancer, organ transplantation, and autoimmune conditions.  Hepatitis C blood testing is recommended for all people born from [birth year] through 1967 and any individual with known risk factors for hepatitis C.  Healthy men should no longer receive prostate-specific antigen (PSA) blood tests as part of routine cancer screening. Talk to your health care provider about prostate cancer screening.  Testicular cancer screening is not recommended for adolescents or adult males who have no symptoms. Screening includes self-exam, a health care provider exam, and other screening tests. Consult with your health care provider about any symptoms you have or any concerns you have about testicular cancer.  Practice safe sex. Use condoms and avoid high-risk sexual practices to reduce the spread of sexually transmitted infections (STIs).  You should be screened for STIs, including gonorrhea and chlamydia  if:  You are sexually active and are younger than 24 years.  You are older than 24 years, and your health care provider tells you that you are at risk for this type of infection.  Your sexual activity has changed since you were last screened, and you are at an increased risk for chlamydia or gonorrhea. Ask your health care provider if you are at risk.  If you are at risk of being infected with HIV, it is recommended that you take a prescription medicine daily to prevent HIV infection. This is called pre-exposure prophylaxis (PrEP). You are considered at risk if:  You are a man who has sex with other men (MSM).  You are a heterosexual man who is sexually active with multiple partners.  You take drugs by injection.  You are sexually active with a partner who has HIV.  Talk with your health care provider about whether you are at high risk of being infected with HIV. If you choose to begin PrEP, you should first be tested for HIV. You should then be tested every 3 months for as long as you are taking PrEP.  Use sunscreen. Apply sunscreen liberally and repeatedly throughout the day. You should seek shade when your shadow is shorter than you. Protect yourself by wearing long sleeves, pants, a wide-brimmed hat, and sunglasses year round whenever you are outdoors.  Tell your health care provider of new moles or changes in moles, especially if there is a change in shape or color. Also, tell your health care provider if a mole is larger than the size of a pencil eraser.  A one-time screening for abdominal aortic aneurysm (AAA) and surgical repair of large AAAs by ultrasound is recommended for men aged 97-75 years who are current or former smokers.  Stay current with your vaccines (immunizations).   This information is not intended to replace advice given to you by your health care provider. Make sure you discuss any questions you have with your health care provider.   Document Released:  11/26/2007 Document Revised: 06/20/2014 Document Reviewed: 10/25/2010 Elsevier Interactive Patient Education [employer].

## 2016-03-29 ENCOUNTER — Encounter: Payer: Self-pay | Admitting: Family

## 2016-03-29 ENCOUNTER — Other Ambulatory Visit (INDEPENDENT_AMBULATORY_CARE_PROVIDER_SITE_OTHER): Payer: 59

## 2016-03-29 ENCOUNTER — Ambulatory Visit (INDEPENDENT_AMBULATORY_CARE_PROVIDER_SITE_OTHER)
Admission: RE | Admit: 2016-03-29 | Discharge: 2016-03-29 | Disposition: A | Discharge status: Home / Self Care | Payer: 59 | Source: Ambulatory Visit | Attending: Family | Admitting: Family

## 2016-03-29 DIAGNOSIS — R202 Paresthesia of skin: Secondary | ICD-10-CM | POA: Diagnosis not present

## 2016-03-29 DIAGNOSIS — R200 Anesthesia of skin: Secondary | ICD-10-CM

## 2016-03-29 DIAGNOSIS — Z0000 Encounter for general adult medical examination without abnormal findings: Secondary | ICD-10-CM

## 2016-03-29 DIAGNOSIS — R7989 Other specified abnormal findings of blood chemistry: Secondary | ICD-10-CM

## 2016-03-29 LAB — COMPREHENSIVE METABOLIC PANEL
ALBUMIN: 4.6 g/dL (ref 3.5–5.2)
ALK PHOS: 38 U/L — AB (ref 39–117)
ALT: 33 U/L (ref 0–53)
AST: 23 U/L (ref 0–37)
BILIRUBIN TOTAL: 1.1 mg/dL (ref 0.2–1.2)
BUN: 16 mg/dL (ref 6–23)
CO2: 24 mEq/L (ref 19–32)
CREATININE: 1.01 mg/dL (ref 0.40–1.50)
Calcium: 9.8 mg/dL (ref 8.4–10.5)
Chloride: 103 mEq/L (ref 96–112)
GFR: 80.97 mL/min (ref >60.00)
Glucose, Bld: 108 mg/dL — ABNORMAL HIGH (ref 70–99)
Potassium: 4.3 mEq/L (ref 3.5–5.1)
SODIUM: 138 meq/L (ref 135–145)
TOTAL PROTEIN: 6.9 g/dL (ref 6.0–8.3)

## 2016-03-29 LAB — LIPID PANEL
CHOLESTEROL: 222 mg/dL — AB (ref 0–200)
HDL: 46 mg/dL (ref >39.00)
LDL CALC: 142 mg/dL — AB (ref 0–99)
NonHDL: 175.9
Total CHOL/HDL Ratio: 5
Triglycerides: 168 mg/dL — ABNORMAL HIGH (ref 0.0–149.0)
VLDL: 33.6 mg/dL (ref 0.0–40.0)

## 2016-03-29 LAB — CBC
HCT: 45.5 % (ref 39.0–52.0)
Hemoglobin: 15.9 g/dL (ref 13.0–17.0)
MCHC: 34.9 g/dL (ref 30.0–36.0)
MCV: 89.6 fl (ref 78.0–100.0)
Platelets: 215 10*3/uL (ref 150.0–400.0)
RBC: 5.08 Mil/uL (ref 4.22–5.81)
RDW: 13.1 % (ref 11.5–15.5)
WBC: 7.1 10*3/uL (ref 4.0–10.5)

## 2016-03-29 LAB — PSA: PSA: 0.95 ng/mL (ref 0.10–4.00)

## 2016-03-29 LAB — TESTOSTERONE: TESTOSTERONE: 256.27 ng/dL — AB (ref 300.00–890.00)

## 2016-03-30 LAB — HEPATITIS C ANTIBODY: HCV AB: NEGATIVE

## 2016-03-31 ENCOUNTER — Encounter (HOSPITAL_COMMUNITY): Payer: Self-pay | Admitting: Psychiatry

## 2016-03-31 ENCOUNTER — Ambulatory Visit (INDEPENDENT_AMBULATORY_CARE_PROVIDER_SITE_OTHER): Payer: 59 | Admitting: Psychiatry

## 2016-03-31 VITALS — BP 122/72 | HR 98 | Ht 69.0 in | Wt 245.0 lb

## 2016-03-31 DIAGNOSIS — F319 Bipolar disorder, unspecified: Secondary | ICD-10-CM

## 2016-03-31 DIAGNOSIS — Z807 Family history of other malignant neoplasms of lymphoid, hematopoietic and related tissues: Secondary | ICD-10-CM | POA: Diagnosis not present

## 2016-03-31 DIAGNOSIS — F17210 Nicotine dependence, cigarettes, uncomplicated: Secondary | ICD-10-CM

## 2016-03-31 DIAGNOSIS — Z8042 Family history of malignant neoplasm of prostate: Secondary | ICD-10-CM

## 2016-03-31 DIAGNOSIS — Z811 Family history of alcohol abuse and dependence: Secondary | ICD-10-CM | POA: Diagnosis not present

## 2016-03-31 DIAGNOSIS — F3170 Bipolar disorder, currently in remission, most recent episode unspecified: Secondary | ICD-10-CM

## 2016-03-31 NOTE — Progress Notes (Signed)
Patient ID: Harry Carroll, male   DOB: 1961-01-11, 55 y.o.   MRN: [id number] Patient ID: Harry Carroll, male   DOB: [birth year], 55 y.o.   MRN: [id number] [facility] MD/PA/NP OP Progress Note  03/31/2016 3:57 PM Harry Carroll  MRN:  [id number]  Subjective:   Pt states he is doing well.   Hand tremor and tinnitus have resolved. He has more energy and is feeling better. Pt has been more productive and goal oriented.   Work and home life are good. He has been golfing. No concerns or compliants today.   Pt denies depression. Denies anhedonia, isolation, crying spells, low motivation, poor hygiene, worthlessness and hopelessness. Denies SI/HI. Denies AVH. He is social and playing golf.   Sleep is good. Appetite is good and he has lost some weight due to dieting. Concentration is good.  Denies manic and hypomanic symptoms including periods of decreased need for sleep, increased energy, mood lability, impulsivity, FOI, and excessive spending.   Chief Complaint: "really good" Chief Complaint    Follow-up     Visit Diagnosis:     ICD-9-CM ICD-10-CM   1. Bipolar I disorder in remission ([place]) 296.7 F31.9     Past Medical History:  Past Medical History:  Diagnosis Date  . Arthritis   . Bipolar 1 disorder (HCC)    Dr Clovis Pu   . Carpal tunnel syndrome    saw Dr Kristie Cowman   . Colon polyps    adenomatous  . Diverticulitis   . Heart murmur   . Hyperlipemia   . Insomnia   . Normal cardiac stress test    CP 01-2009---Nl ECHO and stress test neg  . Restless leg   . Sleep apnea     Past Surgical History:  Procedure Laterality Date  . COLONOSCOPY WITH PROPOFOL N/A 04/02/2014   Procedure: COLONOSCOPY WITH PROPOFOL;  Surgeon: Inda Castle, MD;  Location: [facility];  Service: Endoscopy;  Laterality: N/A;  . CYSTOSCOPY WITH URETHRAL DILATATION  12/20/2011   Procedure: CYSTOSCOPY WITH URETHRAL DILATATION;  Surgeon: Earnstine Regal, MD;  Location: [facility];  Service: General;;  with foley insertion  .  CYSTOSCOPY WITH URETHRAL DILATATION  12/20/2011   Procedure: CYSTOSCOPY WITH URETHRAL DILATATION;  Surgeon: Franchot Gallo, MD;  Location: [facility];  Service: Urology;;  . HOT HEMOSTASIS N/A 04/02/2014   Procedure: HOT HEMOSTASIS (ARGON PLASMA COAGULATION/BICAP);  Surgeon: Inda Castle, MD;  Location: [facility];  Service: Endoscopy;  Laterality: N/A;  . maxillofacial surgery    . PARTIAL COLECTOMY  12/20/2011   Procedure: PARTIAL COLECTOMY;  Surgeon: Earnstine Regal, MD;  Location: [facility];  Service: General;  Laterality: N/A;  Sigmoid colectomy  . right knee arthroscopy     2007  . right shoulder surgery     X2  . SHOULDER SURGERY     LEFT   Past Psych hx: Meds: Ambien for 10 yrs takes it nightly along with CPAP, has been on Depakote and Lamictal for many years Hospitalizations: denies SIB/Suicide attempts: denies, denies family hx of suicide Denies hx of abuse.   Family History:  Family History  Problem Relation Age of Onset  . Prostate cancer Paternal Grandfather   . Cancer Paternal Aunt     lymph cancer  . Alcohol abuse Father   . Alcohol abuse Sister   . Colon cancer Neg Hx   . CAD Neg Hx   . Pancreatic cancer Neg Hx   . Rectal cancer Neg Hx   .  Stomach cancer Neg Hx   . Drug abuse Neg Hx   . Anxiety disorder Neg Hx   . Bipolar disorder Neg Hx   . Depression Neg Hx    Social History:  Social History   Social History  . Marital status: Married    Spouse name: N/A  . Number of children: 4  . 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 Smoking status: Current Some Day Smoker    Types: Cigarettes, Cigars  . Smokeless tobacco: Current User    Types: Snuff  . Alcohol use Yes     Comment:  a couple beers a few times a week   . Drug use: No     Comment: denies THC, cocainse, stimulants, pain meds, benzos, synthetic drugs  . Sexual activity: No   Other Topics Concern  . None   Social  History Narrative   Born in [place] and grew up in [place]. Currently resides in a house with his wife. 1 dog, 1 snake, 1 cat. 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    Denies religious beliefs that would effect health care.        Musculoskeletal: Strength & Muscle Tone: within normal limits Gait & Station: normal Patient leans: straight  Psychiatric Specialty Exam: Medication Refill  Pertinent negatives include no abdominal pain, chest pain, chills, coughing, fever, headaches, nausea, neck pain, rash, sore throat, vomiting or weakness.    Review of Systems  Constitutional: Negative for chills and fever.  HENT: Negative for nosebleeds, sore throat and tinnitus.   Eyes: Negative for blurred vision, double vision and pain.  Respiratory: Negative for cough, shortness of breath and wheezing.   Cardiovascular: Negative for chest pain, palpitations and leg swelling.  Gastrointestinal: Negative for abdominal pain, heartburn, nausea and vomiting.  Musculoskeletal: Negative for back pain, falls, joint pain and neck pain.  Skin: Negative for itching and rash.  Neurological: Positive for sensory change. Negative for dizziness, tremors, seizures, loss of consciousness, weakness and headaches.  Psychiatric/Behavioral: Negative for depression, hallucinations, substance abuse and suicidal ideas. The patient is not nervous/anxious and does not have insomnia.     Blood pressure 122/72, pulse 98, height 5\' 9"  (1.753 m), weight 245 lb (111.1 kg).Body mass index is 36.18 kg/m.  General Appearance: Fairly Groomed  Eye Contact:  Good  Speech:  Clear and Coherent and Normal Rate  Volume:  Normal  Mood:  Euthymic  Affect:  Full Range  Thought Process:  Goal Directed  Orientation:  Full (Time, Place, and Person)  Thought Content:  Negative  Suicidal Thoughts:  No  Homicidal Thoughts:  No  Memory:  Immediate;   Good Recent;   Good Remote;   Good  Judgement:  Good  Insight:  Good  Psychomotor  Activity:  Normal  Concentration:  Good  Recall:  Good  Fund of Knowledge: Good  Language: Good  Akathisia:  No  Handed:  Right  AIMS (if indicated):  n/a  Assets:  Communication Skills Desire for Improvement Financial Resources/Insurance Housing Intimacy Leisure Time Physical Health Resilience Social Support Talents/Skills Transportation Vocational/Educational  ADL's:  Intact  Cognition: WNL  Sleep:  good   Is the patient at risk to self?  No. Has the patient been a risk to self in the past 6 months?  No. Has the patient been a risk to self within the distant past?  No. Is the  patient a risk to others?  No. Has the patient been a risk to others in the past 6 months?  No. Has the patient been a risk to others within the distant past?  No.  Current Medications: Current Outpatient Prescriptions  Medication Sig Dispense Refill  . traMADol (ULTRAM) 50 MG tablet Take 50 mg by mouth every 6 (six) hours as needed for moderate pain or severe pain. Reported on 11/19/2015     No current facility-administered medications for this visit.      Assessment: Bipolar I- in remission  Treatment Plan Summary:Medication management and Plan see below Plan of Care: Medication management with supportive therapy. Risks/benefits and SE of the medication discussed. Pt verbalized understanding and verbal consent obtained for treatment. Affirm with the patient that the medications are taken as ordered. Patient expressed understanding of how their medications were to be used.   Reviewed Dr. Jacky Kindle notes Question whether pt has Bipolar disorder and needs to be on 2 mood stabalizers. Reports stable mood and feels good. No meds prescribed today.   Laboratory: reviewed Depakote level 72, chol 255, LDL 181, CBC WNL, TSH WNL    Psychotherapy: Therapy: brief supportive therapy provided. Discussed psychosocial stressors in detail.    Medications:no meds at this at time   Pt is aware  that symptoms could return and is monitoring his symptoms  Routine PRN Medications: Yes  Consultations: None at this time  Safety Concerns: Pt denies SI and is at an acute low risk for suicide.Patient told to call clinic if any problems occur. Patient advised to go to ER if they should develop SI/HI, side effects, or if symptoms worsen. Has crisis numbers to call if needed. Pt verbalized understanding.   Other: F/up in 4 months or sooner if needed              Charlcie Cradle 03/31/2016, 3:57 PM

## 2016-04-05 ENCOUNTER — Encounter: Payer: Self-pay | Admitting: Family

## 2016-04-05 DIAGNOSIS — I6523 Occlusion and stenosis of bilateral carotid arteries: Secondary | ICD-10-CM

## 2016-04-05 NOTE — Telephone Encounter (Signed)
Pt called concern about this, please advise

## 2016-04-06 ENCOUNTER — Other Ambulatory Visit (INDEPENDENT_AMBULATORY_CARE_PROVIDER_SITE_OTHER): Payer: 59

## 2016-04-06 ENCOUNTER — Encounter: Payer: Self-pay | Admitting: Family

## 2016-04-06 DIAGNOSIS — E349 Endocrine disorder, unspecified: Secondary | ICD-10-CM | POA: Diagnosis not present

## 2016-04-06 DIAGNOSIS — R7989 Other specified abnormal findings of blood chemistry: Secondary | ICD-10-CM

## 2016-04-06 LAB — LUTEINIZING HORMONE: LH: 2.44 m[IU]/mL (ref 1.50–9.30)

## 2016-04-06 LAB — FOLLICLE STIMULATING HORMONE: FSH: 3.6 m[IU]/mL (ref 1.4–18.1)

## 2016-04-06 LAB — TESTOSTERONE: TESTOSTERONE: 276.12 ng/dL — AB (ref 300.00–890.00)

## 2016-04-07 ENCOUNTER — Other Ambulatory Visit: Payer: Self-pay | Admitting: Family

## 2016-04-07 DIAGNOSIS — E291 Testicular hypofunction: Secondary | ICD-10-CM

## 2016-04-11 ENCOUNTER — Other Ambulatory Visit (INDEPENDENT_AMBULATORY_CARE_PROVIDER_SITE_OTHER): Payer: 59

## 2016-04-11 DIAGNOSIS — E291 Testicular hypofunction: Secondary | ICD-10-CM | POA: Diagnosis not present

## 2016-04-11 LAB — CORTISOL: CORTISOL PLASMA: 10.4 ug/dL

## 2016-04-11 LAB — T4: T4, Total: 6.5 ug/dL (ref 4.5–12.0)

## 2016-04-20 ENCOUNTER — Other Ambulatory Visit: Payer: Self-pay | Admitting: Family

## 2016-04-20 DIAGNOSIS — I6523 Occlusion and stenosis of bilateral carotid arteries: Secondary | ICD-10-CM

## 2016-04-22 ENCOUNTER — Encounter: Payer: Self-pay | Admitting: Family

## 2016-04-27 ENCOUNTER — Other Ambulatory Visit: Payer: Self-pay | Admitting: Neurological Surgery

## 2016-04-27 DIAGNOSIS — M5412 Radiculopathy, cervical region: Secondary | ICD-10-CM

## 2016-04-28 ENCOUNTER — Other Ambulatory Visit: Payer: Self-pay | Admitting: Family

## 2016-04-28 MED ORDER — TESTOSTERONE CYPIONATE 100 MG/ML IM SOLN: 100.0000 mg | INTRAMUSCULAR | 0 refills | Status: DC

## 2016-05-04 ENCOUNTER — Ambulatory Visit (HOSPITAL_COMMUNITY)
Admission: RE | Admit: 2016-05-04 | Discharge: 2016-05-04 | Disposition: A | Discharge status: Home / Self Care | Payer: 59 | Source: Ambulatory Visit | Attending: Internal Medicine | Admitting: Internal Medicine

## 2016-05-04 DIAGNOSIS — I6523 Occlusion and stenosis of bilateral carotid arteries: Secondary | ICD-10-CM | POA: Diagnosis not present

## 2016-05-07 ENCOUNTER — Encounter: Payer: Self-pay | Admitting: Family

## 2016-05-11 ENCOUNTER — Ambulatory Visit
Admission: RE | Admit: 2016-05-11 | Discharge: 2016-05-11 | Disposition: A | Discharge status: Home / Self Care | Payer: 59 | Source: Ambulatory Visit | Attending: Neurological Surgery | Admitting: Neurological Surgery

## 2016-05-11 DIAGNOSIS — M5412 Radiculopathy, cervical region: Secondary | ICD-10-CM

## 2016-06-22 ENCOUNTER — Telehealth: Payer: 59 | Admitting: Nurse Practitioner

## 2016-06-22 DIAGNOSIS — R05 Cough: Secondary | ICD-10-CM

## 2016-06-22 DIAGNOSIS — R059 Cough, unspecified: Secondary | ICD-10-CM

## 2016-06-22 MED ORDER — BENZONATATE 100 MG PO CAPS: 100.0000 mg | ORAL_CAPSULE | Freq: Three times a day (TID) | ORAL | 0 refills | Status: DC | PRN

## 2016-06-22 MED ORDER — AZITHROMYCIN 250 MG PO TABS: ORAL_TABLET | ORAL | 0 refills | Status: DC

## 2016-06-22 NOTE — Progress Notes (Signed)

## 2016-07-04 ENCOUNTER — Telehealth: Payer: Self-pay | Admitting: Emergency Medicine

## 2016-07-04 MED ORDER — OSELTAMIVIR PHOSPHATE 75 MG PO CAPS: 75.0000 mg | ORAL_CAPSULE | Freq: Every day | ORAL | 0 refills | Status: DC

## 2016-07-04 NOTE — Telephone Encounter (Signed)
LVM letting pt know.  

## 2016-07-04 NOTE — Telephone Encounter (Signed)
Please advise 

## 2016-07-04 NOTE — Telephone Encounter (Signed)
Medication sent to pharmacy  

## 2016-07-04 NOTE — Telephone Encounter (Signed)
Pt called and stated his wife is being treated for the flu. He is wondering if he can get a prescription for tamiflu to prevent getting it. Please advise thanks

## 2016-08-04 ENCOUNTER — Ambulatory Visit (HOSPITAL_COMMUNITY): Payer: Self-pay | Admitting: Psychiatry

## 2016-08-11 ENCOUNTER — Encounter (HOSPITAL_COMMUNITY): Payer: Self-pay | Admitting: Psychiatry

## 2016-08-11 ENCOUNTER — Ambulatory Visit (INDEPENDENT_AMBULATORY_CARE_PROVIDER_SITE_OTHER): Payer: 59 | Admitting: Psychiatry

## 2016-08-11 VITALS — BP 128/76 | HR 100 | Ht 69.0 in | Wt 254.0 lb

## 2016-08-11 DIAGNOSIS — F319 Bipolar disorder, unspecified: Secondary | ICD-10-CM

## 2016-08-11 DIAGNOSIS — F1099 Alcohol use, unspecified with unspecified alcohol-induced disorder: Secondary | ICD-10-CM

## 2016-08-11 DIAGNOSIS — Z811 Family history of alcohol abuse and dependence: Secondary | ICD-10-CM | POA: Diagnosis not present

## 2016-08-11 DIAGNOSIS — F17210 Nicotine dependence, cigarettes, uncomplicated: Secondary | ICD-10-CM | POA: Diagnosis not present

## 2016-08-11 DIAGNOSIS — Z79899 Other long term (current) drug therapy: Secondary | ICD-10-CM

## 2016-08-11 DIAGNOSIS — F3170 Bipolar disorder, currently in remission, most recent episode unspecified: Secondary | ICD-10-CM

## 2016-08-11 NOTE — Progress Notes (Signed)
Patient ID: Harry Carroll, male   DOB: 11/18/1960, 56 y.o.   MRN: [id number] Patient ID: Harry Carroll, male   DOB: [birth year], 56 y.o.   MRN: [id number] [facility] MD/PA/NP OP Progress Note  08/11/2016 4:29 PM Harry Carroll  MRN:  [id number]  HPI: Pt has no concerns today.   Pt has a stressful job. Home is good. All his kids are out of the house now.   Health is ok but he is still having some numbness in his hands.   Pt denies depression. Denies anhedonia, isolation, crying spells, low motivation, poor hygiene, worthlessness and hopelessness. Denies SI/HI. He and his wife are more social and   Sleep, appetite and energy are good.   Denies manic and hypomanic symptoms including periods of decreased need for sleep, increased energy, mood lability, impulsivity, FOI, and excessive spending.    Chief Complaint: " good"  Visit Diagnosis:     ICD-9-CM ICD-10-CM   1. Bipolar I disorder in remission ([place]) 296.7 F31.9     Past Medical History:  Past Medical History:  Diagnosis Date  . Arthritis   . Bipolar 1 disorder (HCC)    Dr Clovis Pu   . Carpal tunnel syndrome    saw Dr Kristie Cowman   . Colon polyps    adenomatous  . Diverticulitis   . Heart murmur   . Hyperlipemia   . Insomnia   . Normal cardiac stress test    CP 01-2009---Nl ECHO and stress test neg  . Restless leg   . Sleep apnea     Past Surgical History:  Procedure Laterality Date  . COLONOSCOPY WITH PROPOFOL N/A 04/02/2014   Procedure: COLONOSCOPY WITH PROPOFOL;  Surgeon: Inda Castle, MD;  Location: [facility];  Service: Endoscopy;  Laterality: N/A;  . CYSTOSCOPY WITH URETHRAL DILATATION  12/20/2011   Procedure: CYSTOSCOPY WITH URETHRAL DILATATION;  Surgeon: Earnstine Regal, MD;  Location: [facility];  Service: General;;  with foley insertion  . CYSTOSCOPY WITH URETHRAL DILATATION  12/20/2011   Procedure: CYSTOSCOPY WITH URETHRAL DILATATION;  Surgeon: Franchot Gallo, MD;  Location: [facility];  Service: Urology;;  . HOT HEMOSTASIS N/A  04/02/2014   Procedure: HOT HEMOSTASIS (ARGON PLASMA COAGULATION/BICAP);  Surgeon: Inda Castle, MD;  Location: [facility];  Service: Endoscopy;  Laterality: N/A;  . maxillofacial surgery    . PARTIAL COLECTOMY  12/20/2011   Procedure: PARTIAL COLECTOMY;  Surgeon: Earnstine Regal, MD;  Location: [facility];  Service: General;  Laterality: N/A;  Sigmoid colectomy  . right knee arthroscopy     2007  . right shoulder surgery     X2  . SHOULDER SURGERY     LEFT   Past Psych hx: Meds: Ambien for 10 yrs takes it nightly along with CPAP, has been on Depakote and Lamictal for many years Hospitalizations: denies SIB/Suicide attempts: denies, denies family hx of suicide Denies hx of abuse.   Family History:  Family History  Problem Relation Age of Onset  . Prostate cancer Paternal Grandfather   . Cancer Paternal Aunt     lymph cancer  . Alcohol abuse Father   . Alcohol abuse Sister   . Colon cancer Neg Hx   . CAD Neg Hx   . Pancreatic cancer Neg Hx   . Rectal cancer Neg Hx   . Stomach cancer Neg Hx   . Drug abuse Neg Hx   . Anxiety disorder Neg Hx   . Bipolar disorder Neg Hx   .  Depression Neg Hx    Social History:  Social History   Social History  . Marital status: Married    Spouse name: N/A  . Number of children: 4  . 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 Smoking status: Current Some Day Smoker    Types: Cigarettes, Cigars  . Smokeless tobacco: Current User    Types: Snuff  . Alcohol use Yes     Comment:  a couple beers a few times a week   . Drug use: No     Comment: denies THC, cocainse, stimulants, pain meds, benzos, synthetic drugs  . Sexual activity: No   Other Topics Concern  . None   Social History Narrative   Born in [place] and grew up in [place]. Currently resides in a house with his wife. 1 dog, 1 snake, 1 cat. 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    Denies religious beliefs  that would effect health care.        Musculoskeletal: Strength & Muscle Tone: within normal limits Gait & Station: normal Patient leans: straight  Psychiatric Specialty Exam: reviewed ROS and MSE on 08/11/16 and same as previous visits except as noted  HPI  Review of Systems  Constitutional: Negative for chills, fever and malaise/fatigue.  HENT: Negative for congestion, nosebleeds, sinus pain, sore throat and tinnitus.   Neurological: Positive for sensory change. Negative for dizziness, tremors, seizures, loss of consciousness and headaches.  Psychiatric/Behavioral: Negative for depression, hallucinations, substance abuse and suicidal ideas. The patient is not nervous/anxious and does not have insomnia.     There were no vitals taken for this visit.There is no height or weight on file to calculate BMI.  General Appearance: Fairly Groomed  Eye Contact:  Good  Speech:  Clear and Coherent and Normal Rate  Volume:  Normal  Mood:  Euthymic  Affect:  Full Range  Thought Process:  Goal Directed  Orientation:  Full (Time, Place, and Person)  Thought Content:  Negative  Suicidal Thoughts:  No  Homicidal Thoughts:  No  Memory:  Immediate;   Good Recent;   Good Remote;   Good  Judgement:  Good  Insight:  Good  Psychomotor Activity:  Normal  Concentration:  Good  Recall:  Good  Fund of Knowledge: Good  Language: Good  Akathisia:  No  Handed:  Right  AIMS (if indicated):  n/a  Assets:  Communication Skills Desire for Improvement Financial Resources/Insurance Housing Intimacy Leisure Time Physical Health Resilience Social Support Talents/Skills Transportation Vocational/Educational  ADL's:  Intact  Cognition: WNL  Sleep:  good   Is the patient at risk to self?  No. Has the patient been a risk to self in the past 6 months?  No. Has the patient been a risk to self within the distant past?  No. Is the patient a risk to others?  No. Has the patient been a risk to others  in the past 6 months?  No. Has the patient been a risk to others within the distant past?  No.  Current Medications: Current Outpatient Prescriptions  Medication Sig Dispense Refill  . pravastatin (PRAVACHOL) 10 MG tablet Take 10 mg by mouth daily.    Marland Kitchen testosterone cypionate (DEPOTESTOTERONE CYPIONATE) 100 MG/ML injection Inject 1 mL (100 mg total) into the muscle every 14 (fourteen) days. For IM use only 10 mL 0  . benzonatate (TESSALON PERLES) 100  MG capsule Take 1 capsule (100 mg total) by mouth 3 (three) times daily as needed for cough. (Patient not taking: Reported on 08/11/2016) 20 capsule 0  . oseltamivir (TAMIFLU) 75 MG capsule Take 1 capsule (75 mg total) by mouth daily. (Patient not taking: Reported on 08/11/2016) 7 capsule 0  . traMADol (ULTRAM) 50 MG tablet Take 50 mg by mouth every 6 (six) hours as needed for moderate pain or severe pain. Reported on 11/19/2015     No current facility-administered medications for this visit.     reviewed A&P on 08/11/16 and same as previous visits except as noted  Assessment: Bipolar I- in remission  Treatment Plan Summary:Medication management and Plan see below Plan of Care: Medication management with supportive therapy. Risks/benefits and SE of the medication discussed. Pt verbalized understanding and verbal consent obtained for treatment. Affirm with the patient that the medications are taken as ordered. Patient expressed understanding of how their medications were to be used.   Reviewed Dr. Jacky Kindle notes Question whether pt has Bipolar disorder and needs to be on 2 mood stabalizers. Reports stable mood and feels good. No meds prescribed today.   Laboratory: reviewed Depakote level 72, chol 255, LDL 181, CBC WNL, TSH WNL    Psychotherapy: Therapy: brief supportive therapy provided. Discussed psychosocial stressors in detail.    Medications:no meds at this at time   Pt is aware that symptoms could return and is monitoring his  symptoms  Routine PRN Medications: Yes  Consultations: None at this time  Safety Concerns: Pt denies SI and is at an acute low risk for suicide.Patient told to call clinic if any problems occur. Patient advised to go to ER if they should develop SI/HI, side effects, or if symptoms worsen. Has crisis numbers to call if needed. Pt verbalized understanding.   Other: F/up prn              Charlcie Cradle 08/11/2016, 4:29 PM

## 2016-08-23 ENCOUNTER — Other Ambulatory Visit: Payer: Self-pay | Admitting: Family

## 2016-10-03 ENCOUNTER — Other Ambulatory Visit: Payer: Self-pay | Admitting: Family

## 2016-10-03 DIAGNOSIS — Z5181 Encounter for therapeutic drug level monitoring: Secondary | ICD-10-CM

## 2016-10-03 DIAGNOSIS — R7989 Other specified abnormal findings of blood chemistry: Secondary | ICD-10-CM

## 2016-10-05 ENCOUNTER — Other Ambulatory Visit: Payer: Self-pay | Admitting: *Deleted

## 2016-10-05 DIAGNOSIS — M9902 Segmental and somatic dysfunction of thoracic region: Secondary | ICD-10-CM | POA: Diagnosis not present

## 2016-10-05 DIAGNOSIS — M9901 Segmental and somatic dysfunction of cervical region: Secondary | ICD-10-CM | POA: Diagnosis not present

## 2016-10-05 DIAGNOSIS — M5412 Radiculopathy, cervical region: Secondary | ICD-10-CM | POA: Diagnosis not present

## 2016-10-05 MED ORDER — PRAVASTATIN SODIUM 20 MG PO TABS: ORAL_TABLET | ORAL | 2 refills | Status: DC

## 2016-10-06 DIAGNOSIS — M9901 Segmental and somatic dysfunction of cervical region: Secondary | ICD-10-CM | POA: Diagnosis not present

## 2016-10-06 DIAGNOSIS — M5412 Radiculopathy, cervical region: Secondary | ICD-10-CM | POA: Diagnosis not present

## 2016-10-06 DIAGNOSIS — M9902 Segmental and somatic dysfunction of thoracic region: Secondary | ICD-10-CM | POA: Diagnosis not present

## 2016-10-11 DIAGNOSIS — M9902 Segmental and somatic dysfunction of thoracic region: Secondary | ICD-10-CM | POA: Diagnosis not present

## 2016-10-11 DIAGNOSIS — M9901 Segmental and somatic dysfunction of cervical region: Secondary | ICD-10-CM | POA: Diagnosis not present

## 2016-10-11 DIAGNOSIS — M5412 Radiculopathy, cervical region: Secondary | ICD-10-CM | POA: Diagnosis not present

## 2016-10-14 DIAGNOSIS — M5412 Radiculopathy, cervical region: Secondary | ICD-10-CM | POA: Diagnosis not present

## 2016-10-14 DIAGNOSIS — M9901 Segmental and somatic dysfunction of cervical region: Secondary | ICD-10-CM | POA: Diagnosis not present

## 2016-10-14 DIAGNOSIS — M9902 Segmental and somatic dysfunction of thoracic region: Secondary | ICD-10-CM | POA: Diagnosis not present

## 2016-10-17 DIAGNOSIS — M5412 Radiculopathy, cervical region: Secondary | ICD-10-CM | POA: Diagnosis not present

## 2016-10-17 DIAGNOSIS — M9901 Segmental and somatic dysfunction of cervical region: Secondary | ICD-10-CM | POA: Diagnosis not present

## 2016-10-17 DIAGNOSIS — M9902 Segmental and somatic dysfunction of thoracic region: Secondary | ICD-10-CM | POA: Diagnosis not present

## 2017-01-04 ENCOUNTER — Ambulatory Visit (INDEPENDENT_AMBULATORY_CARE_PROVIDER_SITE_OTHER): Payer: 59 | Admitting: Family

## 2017-01-04 ENCOUNTER — Encounter: Payer: Self-pay | Admitting: Family

## 2017-01-04 VITALS — BP 124/84 | HR 83 | Temp 98.6°F | Resp 16 | Ht 69.0 in | Wt 254.0 lb

## 2017-01-04 DIAGNOSIS — R200 Anesthesia of skin: Secondary | ICD-10-CM

## 2017-01-04 DIAGNOSIS — R202 Paresthesia of skin: Secondary | ICD-10-CM

## 2017-01-04 NOTE — Progress Notes (Signed)
Subjective:    Patient ID: Harry Carroll, male    DOB: [birth year], 56 y.o.   MRN: [id number]  Chief Complaint  Patient presents with  . Hand Pain    still having pain and numbness in right hand which he thinks it related to his elbow     HPI:  Harry Carroll is a 56 y.o. male who  has a past medical history of Arthritis; Bipolar 1 disorder ([place]); Carpal tunnel syndrome; Colon polyps; Diverticulitis; Heart murmur; Hyperlipemia; Insomnia; Normal cardiac stress test; Restless leg; and Sleep apnea. and presents today for an office visit.   Continues to experience the associated symptom of pain and numbness located in his right hand which he believes may be related to his elbow. Describes that his hand feels like it is burning at times and numbness and tingling on and off. Has had previous evaluation by neurosurgery for his neck.  Has tried a tennis elbow strap that seems to help as well.   Allergies  Allergen Reactions  . Ciprofloxacin Shortness Of Breath, Nausea And Vomiting, Nausea Only, Rash and Other (See Comments)    Marked mucous production  . Dilaudid [Hydromorphone Hcl] Shortness Of Breath and Nausea Only    Patient developed rash, excessive mucous production and chest pain.  Marland Kitchen Hydrocodone Itching      Outpatient Medications Prior to Visit  Medication Sig Dispense Refill  . pravastatin (PRAVACHOL) 20 MG tablet TAKE 2 TABLETS (40 MG TOTAL) BY MOUTH DAILY. 180 tablet 2  . testosterone cypionate (DEPOTESTOTERONE CYPIONATE) 100 MG/ML injection Inject 1 mL (100 mg total) into the muscle every 14 (fourteen) days. For IM use only 10 mL 0  . traMADol (ULTRAM) 50 MG tablet Take 50 mg by mouth every 6 (six) hours as needed for moderate pain or severe pain. Reported on 11/19/2015    . benzonatate (TESSALON PERLES) 100 MG capsule Take 1 capsule (100 mg total) by mouth 3 (three) times daily as needed for cough. (Patient not taking: Reported on 08/11/2016) 20 capsule 0  . oseltamivir (TAMIFLU)  75 MG capsule Take 1 capsule (75 mg total) by mouth daily. (Patient not taking: Reported on 08/11/2016) 7 capsule 0  . pravastatin (PRAVACHOL) 10 MG tablet Take 10 mg by mouth daily.     No facility-administered medications prior to visit.      Review of Systems  Constitutional: Negative for chills and fever.  Musculoskeletal: Negative for neck pain and neck stiffness.      Objective:    BP 124/84 (BP Location: Left Arm, Patient Position: Sitting, Cuff Size: Large)   Pulse 83   Temp 98.6 F (37 C) (Oral)   Resp 16   Ht 5\' 9"  (1.753 m)   Wt 254 lb (115.2 kg)   SpO2 96%   BMI 37.51 kg/m  Nursing note and vital signs reviewed.  Physical Exam  Constitutional: He is oriented to person, place, and time. He appears well-developed and well-nourished. No distress.  Cardiovascular: Normal rate, regular rhythm, normal heart sounds and intact distal pulses.   Pulmonary/Chest: Effort normal and breath sounds normal.  Musculoskeletal:  Right upper extremity - no obvious deformity, discoloration, or edema. No palpable tenderness able to be elicited. Range of motion appears within normal limits. Pulses and sensation are intact and appropriate. Unable to reproduce symptoms at this time.  Neurological: He is alert and oriented to person, place, and time.  Skin: Skin is warm and dry.  Psychiatric: He has a normal mood  and affect. His behavior is normal. Judgment and thought content normal.       Assessment & Plan:   Problem List Items Addressed This Visit      Other   Numbness and tingling in right hand - Primary    Continues to experience numbness and tingling in the right hand that can result in pain at times. Has used a tennis elbow strap which has helped a little with no symptoms on exam of lateral epicondylitis. Recommend follow up with neurosurgery or neurology for nerve conduction study. Consider referral to hand specialist if symptoms worsen or do not improve.           I have  discontinued Mr. Rohrman's benzonatate and oseltamivir. I am also having him maintain his traMADol, testosterone cypionate, and pravastatin.   Follow-up: Return in about 6 months (around 07/07/2017), or if symptoms worsen or fail to improve.  Mauricio Po, FNP

## 2017-01-04 NOTE — Assessment & Plan Note (Signed)
Continues to experience numbness and tingling in the right hand that can result in pain at times. Has used a tennis elbow strap which has helped a little with no symptoms on exam of lateral epicondylitis. Recommend follow up with neurosurgery or neurology for nerve conduction study. Consider referral to hand specialist if symptoms worsen or do not improve.

## 2017-01-04 NOTE — Patient Instructions (Signed)
Thank you for choosing [facility].  SUMMARY AND INSTRUCTIONS:  Continue to follow up with Dr. Ronnald Ramp.  Continue with the tennis elbow strap as needed.  Follow up:  If your symptoms worsen or fail to improve, please contact our office for further instruction, or in case of emergency go directly to the emergency room at the closest medical facility.

## 2017-02-16 ENCOUNTER — Encounter: Payer: Self-pay | Admitting: Family

## 2017-04-17 ENCOUNTER — Emergency Department (HOSPITAL_COMMUNITY)
Admission: EM | Admit: 2017-04-17 | Discharge: 2017-04-17 | Disposition: A | Discharge status: Home / Self Care | Payer: 59 | Attending: Emergency Medicine | Admitting: Emergency Medicine

## 2017-04-17 ENCOUNTER — Emergency Department (HOSPITAL_COMMUNITY): Payer: 59

## 2017-04-17 ENCOUNTER — Encounter (HOSPITAL_COMMUNITY): Payer: Self-pay | Admitting: Emergency Medicine

## 2017-04-17 ENCOUNTER — Telehealth: Payer: Self-pay | Admitting: Family

## 2017-04-17 DIAGNOSIS — Z5321 Procedure and treatment not carried out due to patient leaving prior to being seen by health care provider: Secondary | ICD-10-CM | POA: Diagnosis not present

## 2017-04-17 DIAGNOSIS — R51 Headache: Secondary | ICD-10-CM | POA: Insufficient documentation

## 2017-04-17 DIAGNOSIS — R0602 Shortness of breath: Secondary | ICD-10-CM | POA: Diagnosis not present

## 2017-04-17 DIAGNOSIS — R079 Chest pain, unspecified: Secondary | ICD-10-CM | POA: Diagnosis not present

## 2017-04-17 LAB — BASIC METABOLIC PANEL
Anion gap: 10 (ref 5–15)
BUN: 17 mg/dL (ref 6–20)
CHLORIDE: 102 mmol/L (ref 101–111)
CO2: 24 mmol/L (ref 22–32)
CREATININE: 0.99 mg/dL (ref 0.61–1.24)
Calcium: 9.6 mg/dL (ref 8.9–10.3)
GFR calc Af Amer: >60 mL/min (ref >60)
Glucose, Bld: 109 mg/dL — ABNORMAL HIGH (ref 65–99)
Potassium: 3.9 mmol/L (ref 3.5–5.1)
Sodium: 136 mmol/L (ref 135–145)

## 2017-04-17 LAB — CBC
HEMATOCRIT: 45.5 % (ref 39.0–52.0)
HEMOGLOBIN: 16.5 g/dL (ref 13.0–17.0)
MCH: 32.8 pg (ref 26.0–34.0)
MCHC: 36.3 g/dL — AB (ref 30.0–36.0)
MCV: 90.5 fL (ref 78.0–100.0)
Platelets: 207 10*3/uL (ref 150–400)
RBC: 5.03 MIL/uL (ref 4.22–5.81)
RDW: 12.6 % (ref 11.5–15.5)
WBC: 6.7 10*3/uL (ref 4.0–10.5)

## 2017-04-17 LAB — I-STAT TROPONIN, ED: TROPONIN I, POC: 0.01 ng/mL (ref 0.00–0.08)

## 2017-04-17 NOTE — Telephone Encounter (Incomplete)
Patient Name: Harry Carroll  DOB: 1961-02-19    Initial Comment Caller has been having chest tightness and pain under his sternum . Pain cleared up after baby aspirin . He also woke up with a headache this morning. { dr. Terri Piedra is no longer at the facility }    Nurse Assessment  Nurse: Leilani Merl, RN, Nira Conn Date/Time [name] Time): 04/17/2017 1:21:45 PM  Confirm and document reason for call. If symptomatic, describe symptoms. ---Caller has been having chest tightness and pain under his sternum that started at 2 this morning and is gone now . Pain cleared up after baby aspirin . He also woke up with a headache this morning.  Does the patient have any new or worsening symptoms? ---Yes  Will a triage be completed? ---Yes  Related visit to physician within the last 2 weeks? ---No  Does the PT have any chronic conditions? (i.e. diabetes, asthma, etc.) ---Yes  List chronic conditions. ---See MR  Is this a behavioral health or substance abuse call? ---No     Guidelines    Guideline Title Affirmed Question Affirmed Notes  Chest Pain Difficulty breathing    Final Disposition User   Go to ED Now Standifer, RN, Heather    Referrals  [facility] - [facility]   Caller Disagree/Comply Comply  Caller Understands Yes  PreDisposition Call Doctor

## 2017-04-17 NOTE — ED Triage Notes (Signed)
Patient c/o central chest pain that woke him up from sleep around 2am and took baby ASA. Patient denies any SOB, pain radiation, n/v. Describes pain now as "lump still there". Patient now also c/o headache denies blurred vision. Patient currently not taking his cholesterol medication or baby ASA per wife.

## 2017-04-19 ENCOUNTER — Emergency Department (HOSPITAL_COMMUNITY)
Admission: EM | Admit: 2017-04-19 | Discharge: 2017-04-19 | Disposition: A | Discharge status: Home / Self Care | Payer: 59 | Attending: Emergency Medicine | Admitting: Emergency Medicine

## 2017-04-19 ENCOUNTER — Encounter (HOSPITAL_COMMUNITY): Payer: Self-pay

## 2017-04-19 ENCOUNTER — Telehealth: Payer: Self-pay | Admitting: Family

## 2017-04-19 ENCOUNTER — Other Ambulatory Visit: Payer: Self-pay

## 2017-04-19 DIAGNOSIS — Z79899 Other long term (current) drug therapy: Secondary | ICD-10-CM | POA: Diagnosis not present

## 2017-04-19 DIAGNOSIS — Z7982 Long term (current) use of aspirin: Secondary | ICD-10-CM | POA: Diagnosis not present

## 2017-04-19 DIAGNOSIS — F17210 Nicotine dependence, cigarettes, uncomplicated: Secondary | ICD-10-CM | POA: Insufficient documentation

## 2017-04-19 DIAGNOSIS — R0789 Other chest pain: Secondary | ICD-10-CM | POA: Insufficient documentation

## 2017-04-19 LAB — CBC
HEMATOCRIT: 44.5 % (ref 39.0–52.0)
Hemoglobin: 16 g/dL (ref 13.0–17.0)
MCH: 32.8 pg (ref 26.0–34.0)
MCHC: 36 g/dL (ref 30.0–36.0)
MCV: 91.2 fL (ref 78.0–100.0)
Platelets: 182 10*3/uL (ref 150–400)
RBC: 4.88 MIL/uL (ref 4.22–5.81)
RDW: 12.7 % (ref 11.5–15.5)
WBC: 5.9 10*3/uL (ref 4.0–10.5)

## 2017-04-19 LAB — BASIC METABOLIC PANEL
Anion gap: 11 (ref 5–15)
BUN: 17 mg/dL (ref 6–20)
CALCIUM: 9.5 mg/dL (ref 8.9–10.3)
CO2: 24 mmol/L (ref 22–32)
Chloride: 104 mmol/L (ref 101–111)
Creatinine, Ser: 1.06 mg/dL (ref 0.61–1.24)
GFR calc Af Amer: >60 mL/min (ref >60)
GLUCOSE: 133 mg/dL — AB (ref 65–99)
POTASSIUM: 3.6 mmol/L (ref 3.5–5.1)
Sodium: 139 mmol/L (ref 135–145)

## 2017-04-19 LAB — I-STAT TROPONIN, ED: Troponin i, poc: 0 ng/mL (ref 0.00–0.08)

## 2017-04-19 NOTE — ED Provider Notes (Signed)
[facility] DEPT Provider Note  CSN: [id number] Arrival date & time: 04/19/17 1332  Chief Complaint(s) Chest Pain  HPI Harry Carroll is a 56 y.o. male who presents to the emergency department for evaluation of abnormal EKG.  Patient presented to the emergency department on November 5 after having episode of brief chest pain around 2:00 or 3:00 in the morning, that lasted for several minutes and improved after taking aspirin.  Patient denied any recurrence of the pain.  Denied any associated nausea, diaphoresis, shortness of breath or radiation.  Pain was nonexertional.  Patient has had prior stress test several years ago but no catheterizations.  He does endorse a history of hyperlipidemia, and cigar smoking.  No hypertension, diabetes, or early family history of cardiac disease.  He denies any history of DVTs/PEs, recent travel, recent surgery, personal history of cancer, autoimmune disorders, or clotting disorders.  During his visit on November 5, patient had triage labs obtained and an EKG however patient left prior to being evaluated by physician due to the fact that his labs and chest x-ray were read as normal and were negative on his my chart.  However when he checked his my chart today and noted that the EKG had some concerning findings, he contacted his PCP's office who recommended he present to the emergency department for additional evaluation.  HPI  Past Medical History Past Medical History:  Diagnosis Date  . Arthritis   . Bipolar 1 disorder (HCC)    Dr Clovis Pu   . Carpal tunnel syndrome    saw Dr Kristie Cowman   . Colon polyps    adenomatous  . Diverticulitis   . Heart murmur   . Hyperlipemia   . Insomnia   . Normal cardiac stress test    CP 01-2009---Nl ECHO and stress test neg  . Restless leg   . Sleep apnea    Patient Active Problem List   Diagnosis Date Noted  . Numbness and tingling in right hand 01/04/2017  . Low testosterone 10/03/2016    . Obesity 03/28/2016  . Bilateral wrist pain 12/25/2015  . Acute diverticulitis 10/21/2015  . Numbness and tingling of both upper extremities while sleeping 08/27/2015  . Routine general medical examination at a health care facility 03/24/2015  . Bipolar I disorder, most recent episode (or current) unspecified 02/26/2015  . Rib contusion 11/13/2014  . Ganglion cyst of wrist 06/26/2014  . History of colonic polyps 04/02/2014  . Diverticulitis of colon (without mention of hemorrhage)(562.11) 01/17/2014  . Chest pain 09/04/2013  . Abdominal pain, left lower quadrant 02/18/2013  . Dizziness 03/29/2012  . Diverticulitis-post resection July 2013 11/27/2011  . OSA (obstructive sleep apnea) 11/17/2011  . Fatigue 10/11/2011  . Annual physical exam 02/25/2011  . Personal history of colonic polyps 02/18/2011  . HYPERTRIGLYCERIDEMIA 02/20/2009  . BIPOLAR DISORDER UNSPECIFIED 11/29/2007  . INSOMNIA 11/29/2007  . LIVER FUNCTION TESTS, ABNORMAL, HX OF 11/29/2007  . DEPRESSION 07/07/2007  . Hyperlipidemia 04/16/2007   Home Medication(s) Prior to Admission medications   Medication Sig Start Date End Date Taking? Authorizing Provider  aspirin EC 81 MG tablet Take 81 mg daily by mouth.   Yes [provider]  pravastatin (PRAVACHOL) 20 MG tablet TAKE 2 TABLETS (40 MG TOTAL) BY MOUTH DAILY. 10/05/16  Yes Golden Circle, FNP  testosterone cypionate (DEPOTESTOTERONE CYPIONATE) 100 MG/ML injection Inject 1 mL (100 mg total) into the muscle every 14 (fourteen) days. For IM use only Patient not taking: Reported on  04/19/2017 04/28/16   Golden Circle, FNP                                                                                                                                    Past Surgical History Past Surgical History:  Procedure Laterality Date  . maxillofacial surgery    . right knee arthroscopy     2007  . right shoulder surgery     X2  . SHOULDER SURGERY     LEFT    Family History Family History  Problem Relation Age of Onset  . Prostate cancer Paternal Grandfather   . Cancer Paternal Aunt        lymph cancer  . Alcohol abuse Father   . Alcohol abuse Sister   . Colon cancer Neg Hx   . CAD Neg Hx   . Pancreatic cancer Neg Hx   . Rectal cancer Neg Hx   . Stomach cancer Neg Hx   . Drug abuse Neg Hx   . Anxiety disorder Neg Hx   . Bipolar disorder Neg Hx   . Depression Neg Hx     Social History Social History   Tobacco Use  . Smoking status: Current Some Day Smoker    Types: Cigars  . Smokeless tobacco: Current User    Types: Snuff  Substance Use Topics  . Alcohol use: Yes    Comment:  a couple beers a few times a week   . Drug use: No    Comment: denies THC, cocainse, stimulants, pain meds, benzos, synthetic drugs   Allergies Ciprofloxacin; Dilaudid [hydromorphone hcl]; and Hydrocodone  Review of Systems Review of Systems All other systems are reviewed and are negative for acute change except as noted in the HPI  Physical Exam Vital Signs  I have reviewed the triage vital signs BP 124/76   Pulse 89   Resp 18   SpO2 94%   Physical Exam  Constitutional: He is oriented to person, place, and time. He appears well-developed and well-nourished. No distress.  HENT:  Head: Normocephalic and atraumatic.  Nose: Nose normal.  Eyes: Conjunctivae and EOM are normal. Pupils are equal, round, and reactive to light. Right eye exhibits no discharge. Left eye exhibits no discharge. No scleral icterus.  Neck: Normal range of motion. Neck supple.  Cardiovascular: Normal rate and regular rhythm. Exam reveals no gallop and no friction rub.  No murmur heard. Pulmonary/Chest: Effort normal and breath sounds normal. No stridor. No respiratory distress. He has no rales.  Abdominal: Soft. He exhibits no distension. There is no tenderness.  Musculoskeletal: He exhibits no edema or tenderness.  Neurological: He is alert and oriented to person,  place, and time.  Skin: Skin is warm and dry. No rash noted. He is not diaphoretic. No erythema.  Psychiatric: He has a normal mood and affect.  Vitals reviewed.   ED Results and Treatments Labs (all labs ordered  are listed, but only abnormal results are displayed) Labs Reviewed  BASIC METABOLIC PANEL - Abnormal; Notable for the following components:      Result Value   Glucose, Bld 133 (*)    All other components within normal limits  CBC  I-STAT TROPONIN, ED                                                                                                                         EKG  EKG Interpretation  Date/Time:  Wednesday April 19 2017 13:50:26 EST Ventricular Rate:  102 PR Interval:    QRS Duration: 85 QT Interval:  350 QTC Calculation: 456 R Axis:   -55 Text Interpretation:  Sinus tachycardia Left anterior fascicular block Low voltage, precordial leads Consider right ventricular hypertrophy Left ventricular hypertrophy Anterior Q waves, possibly due to LVH NO STEMI Otherwise no significant change Confirmed by Addison Lank [id number]) on 04/19/2017 2:14:54 PM      Radiology No results found. Pertinent labs & imaging results that were available during my care of the patient were reviewed by me and considered in my medical decision making (see chart for details).  Medications Ordered in ED Medications - No data to display                                                                                                                                  Procedures Procedures  (including critical care time)  Medical Decision Making / ED Course I have reviewed the nursing notes for this encounter and the patient's prior records (if available in EHR or on provided paperwork).    Atypical chest pain.  EKG without acute ischemic changes or evidence of pericarditis.  Troponin negative.  Feel that this is sufficient to rule out ACS given his prior negative troponin and no  recurrence of the chest pain.  Chest x-ray from his recent presentation was without evidence suggestive of pneumonia, pneumothorax, pneumomediastinum.  No abnormal contour of the mediastinum to suggest dissection. No evidence of acute injuries.  Presentation is not classic for aortic dissection or esophageal perforation.  Low pretest probability for pulmonary embolism.  The patient is safe for discharge with strict return precautions.  Final Clinical Impression(s) / ED Diagnoses Final diagnoses:  Atypical chest pain   Disposition: Discharge  Condition: Good  I have discussed the results, Dx and Tx plan with the patient who expressed understanding and  agree(s) with the plan. Discharge instructions discussed at great length. The patient was given strict return precautions who verbalized understanding of the instructions. No further questions at time of discharge.      Follow Up: Golden Circle, FNP [address] [place] [address] [phone]  Schedule an appointment as soon as possible for a visit  for cardiac stress testing within 30 days.     This chart was dictated using voice recognition software.  Despite best efforts to proofread,  errors can occur which can change the documentation meaning.   Fatima Blank, MD 04/19/17 1725

## 2017-04-19 NOTE — Telephone Encounter (Signed)
Patient Name: Harry Carroll  DOB: [birth year]    Initial Comment Caller states her husband had chest pain and the pt was in the ER. the pt was tested with different tests. The EKG is on MyChart. Caller is reading the test and it indicates that the pt had a heart attack. Could someone please explain the outcome of the EKG?        Nurse Assessment  Nurse: Julien Girt, RN, Almyra Free Date/Time [name] Time): 04/19/2017 1:04:31 PM  Confirm and document reason for call. If symptomatic, describe symptoms. ---Caller states her husband had chest pain and was seen in the ER. He has multiple labs, chest x-ray and an EKG. Adds they left AMA before taking to a practitioner. He is at work and still having chest pain. The EKG is on MyChart, states it was abnormal and mentions a block. She has advised him to go back to [facility] ED for ongoing chest pain and he is on his way. She is calling to get an explanation of is EKG.    Does the patient have any new or worsening symptoms? ---Yes    Will a triage be completed? ---No    Select reason for no triage. ---Other    Please document clinical information provided and list any resource used. ---Advised the caller that I will send this note to the back , after it is reviewed she can anticipate a call back. She will be at the ED but can be reached on the number selected. Robin verbalized understanding ad will cb as needed.           Guidelines      Guideline Title Affirmed Question Affirmed Notes       Final Disposition User    Clinical Call Julien Girt, RN, Almyra Free

## 2017-04-19 NOTE — ED Triage Notes (Signed)
He states he was awakened very early (~0200) this Monday morning from sleep by central area chest "pressure". He then reviewed his information on MyChart and found some concerning verbiage on his EKG and was concerned and per advisement of his wife returned here for eval. He is in no distress.

## 2017-04-20 NOTE — Progress Notes (Signed)
Subjective:    Patient ID: Harry Carroll, male    DOB: [birth year], 58 y.o.   MRN: [id number]  HPI The patient is here for follow up.  04/17/17: He called team health because he was experienced chest tightness in his central chest that work him up.  He took a baby aspirin and the pain resolved.  He also stated a headache earlier that day.  He was advised to go to the emergency room and he did.  He had an EKG, which was abnormal.The EKG showed sinus tachycardia at 104.  Left anterior fascicular block.  Abnormal R wave progression.  Possible anterior infarct.  It was determined that there was no STEMI.  He had blood work done and he had a normal chest x-ray, CMP, CBC and troponin.  He left prior to being evaluated by physician.  2 days later he saw the EKG on my chart and saw that it had some abnormalities.  He called the office and stated that his EKG was abnormal so was advised that he return to the emergency room.  Repeat blood work, including troponin, was normal.  He was discharged home and advised to follow-up to consider an outpatient stress test.  He denies recurrent episodes of chest pain.  He did experience some DOE when he played golf over the summer.  That is his main activity.  He is not exercising regularly.  He does not eat as healthy as he should.      Medications and allergies reviewed with patient and updated if appropriate.  Patient Active Problem List   Diagnosis Date Noted  . Numbness and tingling in right hand 01/04/2017  . Low testosterone 10/03/2016  . Obesity 03/28/2016  . Bilateral wrist pain 12/25/2015  . Acute diverticulitis 10/21/2015  . Numbness and tingling of both upper extremities while sleeping 08/27/2015  . Routine general medical examination at a health care facility 03/24/2015  . Bipolar I disorder, most recent episode (or current) unspecified 02/26/2015  . Rib contusion 11/13/2014  . Ganglion cyst of wrist 06/26/2014  . History of colonic polyps  04/02/2014  . Diverticulitis of colon (without mention of hemorrhage)(562.11) 01/17/2014  . Chest pain 09/04/2013  . Abdominal pain, left lower quadrant 02/18/2013  . Dizziness 03/29/2012  . Diverticulitis-post resection July 2013 11/27/2011  . OSA (obstructive sleep apnea) 11/17/2011  . Fatigue 10/11/2011  . Annual physical exam 02/25/2011  . Personal history of colonic polyps 02/18/2011  . HYPERTRIGLYCERIDEMIA 02/20/2009  . BIPOLAR DISORDER UNSPECIFIED 11/29/2007  . INSOMNIA 11/29/2007  . LIVER FUNCTION TESTS, ABNORMAL, HX OF 11/29/2007  . DEPRESSION 07/07/2007  . Hyperlipidemia 04/16/2007    Current Outpatient Medications on File Prior to Visit  Medication Sig Dispense Refill  . aspirin EC 81 MG tablet Take 81 mg daily by mouth.    . pravastatin (PRAVACHOL) 20 MG tablet TAKE 2 TABLETS (40 MG TOTAL) BY MOUTH DAILY. 180 tablet 2   No current facility-administered medications on file prior to visit.     Past Medical History:  Diagnosis Date  . Arthritis   . Bipolar 1 disorder (HCC)    Dr Clovis Pu   . Carpal tunnel syndrome    saw Dr Kristie Cowman   . Colon polyps    adenomatous  . Diverticulitis   . Heart murmur   . Hyperlipemia   . Insomnia   . Normal cardiac stress test    CP 01-2009---Nl ECHO and stress test neg  . Restless leg   .  Sleep apnea     Past Surgical History:  Procedure Laterality Date  . maxillofacial surgery    . right knee arthroscopy     2007  . right shoulder surgery     X2  . SHOULDER SURGERY     LEFT    Social History   Socioeconomic History  . Marital status: Married    Spouse name: None  . Number of children: 4  . Years of education: 20  . Highest education level: None  Social Needs  . Financial resource strain: None  . Food insecurity - worry: None  . Food insecurity - inability: None  . Transportation needs - medical: None  . Transportation needs - non-medical: None  Occupational History  . Occupation: Health visitor:  [employer]  Tobacco Use  . Smoking status: Current Some Day Smoker    Types: Cigars  . Smokeless tobacco: Current User    Types: Snuff  Substance and Sexual Activity  . Alcohol use: Yes    Comment:  a couple beers a few times a week   . Drug use: No    Comment: denies THC, cocainse, stimulants, pain meds, benzos, synthetic drugs  . Sexual activity: No  Other Topics Concern  . None  Social History Narrative   Born in [place] and grew up in [place]. Currently resides in a house with his wife. 1 dog, 1 snake, 1 cat. 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    Denies religious beliefs that would effect health care.     Family History  Problem Relation Age of Onset  . Prostate cancer Paternal Grandfather   . Cancer Paternal Aunt        lymph cancer  . Alcohol abuse Father   . Alcohol abuse Sister   . Colon cancer Neg Hx   . CAD Neg Hx   . Pancreatic cancer Neg Hx   . Rectal cancer Neg Hx   . Stomach cancer Neg Hx   . Drug abuse Neg Hx   . Anxiety disorder Neg Hx   . Bipolar disorder Neg Hx   . Depression Neg Hx     Review of Systems  Constitutional: Positive for fatigue. Negative for appetite change, chills and fever.  Respiratory: Positive for shortness of breath (with exertion at times). Negative for cough and wheezing.   Cardiovascular: Positive for chest pain (one episode, none since). Negative for palpitations and leg swelling.  Neurological: Negative for dizziness, light-headedness and headaches.       Objective:   Vitals:   04/21/17 0957  BP: 132/72  Pulse: 89  Temp: 99 F (37.2 C)  SpO2: 97%   Wt Readings from Last 3 Encounters:  04/21/17 257 lb (116.6 kg)  01/04/17 254 lb (115.2 kg)  03/28/16 249 lb (112.9 kg)   Body mass index is 37.95 kg/m.   Physical Exam    Constitutional: Appears well-developed and well-nourished. No distress.  HENT:  Head: Normocephalic and atraumatic.  Neck: Neck supple. No tracheal deviation present. No thyromegaly  present.  No cervical lymphadenopathy Cardiovascular: Normal rate, regular rhythm and normal heart sounds.   No murmur heard. No carotid bruit .  No edema Pulmonary/Chest: No chest wall pain with palpation. Effort normal and breath sounds normal. No respiratory distress. No has no wheezes. No rales.  Abdomen: soft, non tender, non distended Skin: Skin is warm and dry. Not diaphoretic.  Psychiatric: Normal mood and affect. Behavior is normal.  Assessment & Plan:    See Problem List for Assessment and Plan of chronic medical problems.

## 2017-04-21 ENCOUNTER — Ambulatory Visit: Payer: 59 | Admitting: Internal Medicine

## 2017-04-21 ENCOUNTER — Encounter: Payer: Self-pay | Admitting: Internal Medicine

## 2017-04-21 VITALS — BP 132/72 | HR 89 | Temp 99.0°F | Ht 69.0 in | Wt 257.0 lb

## 2017-04-21 DIAGNOSIS — R0789 Other chest pain: Secondary | ICD-10-CM | POA: Diagnosis not present

## 2017-04-21 DIAGNOSIS — Z23 Encounter for immunization: Secondary | ICD-10-CM | POA: Diagnosis not present

## 2017-04-21 NOTE — Assessment & Plan Note (Signed)
One episode of chest pain a few days ago, abnormal EKG Has carotid artery disease Taking statin and asa - he will continue Needs stress test - will refer to cardiology Will start to work on changing his diet, work on weight loss and start gentle exercise Will call if he has another episode of chest pain

## 2017-04-21 NOTE — Patient Instructions (Addendum)
  Medications reviewed and updated.  No changes recommended at this time.    A referral was ordered for cardiology.

## 2017-05-01 NOTE — Progress Notes (Deleted)
Referring-Stacy Lorretta Harp MD Reason for referral-Chest pain  HPI: 56 yo male for evaluation of chest pain at request of Binnie Rail MD. Echo 8/10 showed normal LV systolic function, mild LVH, mild biatrial enlargement, mild RVE. Also apparently with negative stress test. Carotid dopplers 11/17 showed 40-59 right and 1-39 left stenosis. ETT 4/15 normal. Seen in the emergency room November 2018 with atypical chest pain. Troponins normal. Chest xray negative. Hgb normal.   Current Outpatient Medications  Medication Sig Dispense Refill  . aspirin EC 81 MG tablet Take 81 mg daily by mouth.    . pravastatin (PRAVACHOL) 20 MG tablet TAKE 2 TABLETS (40 MG TOTAL) BY MOUTH DAILY. 180 tablet 2   No current facility-administered medications for this visit.     Allergies  Allergen Reactions  . Ciprofloxacin Shortness Of Breath, Nausea And Vomiting, Nausea Only, Rash and Other (See Comments)    Marked mucous production  . Dilaudid [Hydromorphone Hcl] Shortness Of Breath and Nausea Only    Patient developed rash, excessive mucous production and chest pain.  Marland Kitchen Hydrocodone Itching    Past Medical History:  Diagnosis Date  . Arthritis   . Bipolar 1 disorder (HCC)    Dr Clovis Pu   . Carpal tunnel syndrome    saw Dr Kristie Cowman   . Colon polyps    adenomatous  . Diverticulitis   . Heart murmur   . Hyperlipemia   . Insomnia   . Normal cardiac stress test    CP 01-2009---Nl ECHO and stress test neg  . Restless leg   . Sleep apnea     Past Surgical History:  Procedure Laterality Date  . COLONOSCOPY WITH PROPOFOL N/A 04/02/2014   Performed by Inda Castle, MD at [facility]  . CYSTOSCOPY WITH URETHRAL DILATATION  12/20/2011   Performed by Earnstine Regal, MD at [facility]  . CYSTOSCOPY WITH URETHRAL DILATATION  12/20/2011   Performed by Franchot Gallo, MD at [facility] ORS  . HOT HEMOSTASIS (ARGON PLASMA COAGULATION/BICAP) N/A 04/02/2014   Performed by Inda Castle, MD at [facility]  .  maxillofacial surgery    . PARTIAL COLECTOMY N/A 12/20/2011   Performed by Earnstine Regal, MD at [facility]  . right knee arthroscopy     2007  . right shoulder surgery     X2  . SHOULDER SURGERY     LEFT    Social History   Socioeconomic History  . Marital status: Married    Spouse name: Not on file  . Number of children: 4  . Years of education: 27  . Highest education level: Not on file  Social Needs  . Financial resource strain: Not on file  . Food insecurity - worry: Not on file  . Food insecurity - inability: Not on file  . Transportation needs - medical: Not on file  . Transportation needs - non-medical: Not on file  Occupational History  . Occupation: Health visitor: [employer]  Tobacco Use  . Smoking status: Current Some Day Smoker    Types: Cigars  . Smokeless tobacco: Current User    Types: Snuff  Substance and Sexual Activity  . Alcohol use: Yes    Comment:  a couple beers a few times a week   . Drug use: No    Comment: denies THC, cocainse, stimulants, pain meds, benzos, synthetic drugs  . Sexual activity: No  Other Topics Concern  . Not on file  Social History Narrative   Born in [place] and grew up in [place]. Currently resides in a house with his wife. 1 dog, 1 snake, 1 cat. 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    Denies religious beliefs that would effect health care.     Family History  Problem Relation Age of Onset  . Prostate cancer Paternal Grandfather   . Cancer Paternal Aunt        lymph cancer  . Alcohol abuse Father   . Alcohol abuse Sister   . Colon cancer Neg Hx   . CAD Neg Hx   . Pancreatic cancer Neg Hx   . Rectal cancer Neg Hx   . Stomach cancer Neg Hx   . Drug abuse Neg Hx   . Anxiety disorder Neg Hx   . Bipolar disorder Neg Hx   . Depression Neg Hx     ROS: no fevers or chills, productive cough, hemoptysis, dysphasia, odynophagia, melena, hematochezia, dysuria, hematuria, rash, seizure activity, orthopnea, PND,  pedal edema, claudication. Remaining systems are negative.  Physical Exam:   There were no vitals taken for this visit.  General:  Well developed/well nourished in NAD Skin warm/dry Patient not depressed No peripheral clubbing Back-normal HEENT-normal/normal eyelids Neck supple/normal carotid upstroke bilaterally; no bruits; no JVD; no thyromegaly chest - CTA/ normal expansion CV - RRR/normal S1 and S2; no murmurs, rubs or gallops;  PMI nondisplaced Abdomen -NT/ND, no HSM, no mass, + bowel sounds, no bruit 2+ femoral pulses, no bruits Ext-no edema, chords, 2+ DP Neuro-grossly nonfocal  ECG - 04/19/2017-sinus rhythm, left anterior fascicular block, left ventricular hypertrophy. personally reviewed  A/P  1  Kirk Ruths, MD

## 2017-05-09 ENCOUNTER — Ambulatory Visit: Payer: 59 | Admitting: Cardiology

## 2017-05-09 ENCOUNTER — Encounter: Payer: Self-pay | Admitting: Cardiology

## 2017-05-09 VITALS — BP 130/84 | HR 89 | Ht 69.0 in | Wt 254.0 lb

## 2017-05-09 DIAGNOSIS — E7800 Pure hypercholesterolemia, unspecified: Secondary | ICD-10-CM

## 2017-05-09 DIAGNOSIS — R072 Precordial pain: Secondary | ICD-10-CM

## 2017-05-09 DIAGNOSIS — I6523 Occlusion and stenosis of bilateral carotid arteries: Secondary | ICD-10-CM | POA: Diagnosis not present

## 2017-05-09 NOTE — Patient Instructions (Signed)
Medication Instructions:   NO CHANGE  Testing/Procedures:  Your physician has requested that you have a stress echocardiogram. For further information please visit [email or web address]. Please follow instruction sheet as given.   Your physician has requested that you have a carotid duplex. This test is an ultrasound of the carotid arteries in your neck. It looks at blood flow through these arteries that supply the brain with blood. Allow one hour for this exam. There are no restrictions or special instructions.    Follow-Up:  Your physician wants you to follow-up in: [place] will receive a reminder letter in the mail two months in advance. If you don't receive a letter, please call our office to schedule the follow-up appointment.

## 2017-05-09 NOTE — Progress Notes (Signed)
Referring-Stacy Burns, MD Reason for referral-Chest pain  HPI: 56 year old male for evaluation of chest pain at request of Billey Gosling M.D. Echocardiogram August 2010 showed normal LV systolic function, mild biatrial enlargement and mild right ventricular enlargement. Exercise treadmill April 2015 normal. Carotid Dopplers November 2017 showed 40-59% right and 1-39% left stenosis. Seen in emergency room November 2018 with chest pain.Troponin normal. Hemoglobin 16. Potassium 3.6. Patient does have some dyspnea with moderate activities. Not with routine activities. No orthopnea, PND, pedal edema or syncope. He does not typically have exertional chest pain. When he was evaluated in the emergency room he had awakened with substernal chest pain described as a burning tightness. No radiation or associated symptoms. Not pleuritic, positional and no associated water brash. Lasted 20 minutes and resolved spontaneously. He cause of the above we were asked to evaluate.  Current Outpatient Medications  Medication Sig Dispense Refill  . aspirin EC 81 MG tablet Take 81 mg daily by mouth.    . pravastatin (PRAVACHOL) 20 MG tablet TAKE 2 TABLETS (40 MG TOTAL) BY MOUTH DAILY. 180 tablet 2   No current facility-administered medications for this visit.     Allergies  Allergen Reactions  . Ciprofloxacin Shortness Of Breath, Nausea And Vomiting, Nausea Only, Rash and Other (See Comments)    Marked mucous production  . Dilaudid [Hydromorphone Hcl] Shortness Of Breath and Nausea Only    Patient developed rash, excessive mucous production and chest pain.  Marland Kitchen Hydrocodone Itching     Past Medical History:  Diagnosis Date  . Arthritis   . Bipolar 1 disorder (HCC)    Dr Clovis Pu   . Carpal tunnel syndrome    saw Dr Kristie Cowman   . Colon polyps    adenomatous  . Diverticulitis   . Heart murmur   . Hyperlipemia   . Insomnia   . Normal cardiac stress test    CP 01-2009---Nl ECHO and stress test neg  . Restless  leg   . Sleep apnea     Past Surgical History:  Procedure Laterality Date  . COLONOSCOPY WITH PROPOFOL N/A 04/02/2014   Procedure: COLONOSCOPY WITH PROPOFOL;  Surgeon: Inda Castle, MD;  Location: [facility];  Service: Endoscopy;  Laterality: N/A;  . CYSTOSCOPY WITH URETHRAL DILATATION  12/20/2011   Procedure: CYSTOSCOPY WITH URETHRAL DILATATION;  Surgeon: Earnstine Regal, MD;  Location: [facility];  Service: General;;  with foley insertion  . CYSTOSCOPY WITH URETHRAL DILATATION  12/20/2011   Procedure: CYSTOSCOPY WITH URETHRAL DILATATION;  Surgeon: Franchot Gallo, MD;  Location: [facility];  Service: Urology;;  . HOT HEMOSTASIS N/A 04/02/2014   Procedure: HOT HEMOSTASIS (ARGON PLASMA COAGULATION/BICAP);  Surgeon: Inda Castle, MD;  Location: [facility];  Service: Endoscopy;  Laterality: N/A;  . maxillofacial surgery    . PARTIAL COLECTOMY  12/20/2011   Procedure: PARTIAL COLECTOMY;  Surgeon: Earnstine Regal, MD;  Location: [facility];  Service: General;  Laterality: N/A;  Sigmoid colectomy  . right knee arthroscopy     2007  . right shoulder surgery     X2  . SHOULDER SURGERY     LEFT    Social History   Socioeconomic History  . Marital status: Married    Spouse name: Not on file  . Number of children: 4  . Years of education: 10  . Highest education level: Not on file  Social Needs  . Financial resource strain: Not on file  . Food insecurity - worry: Not on file  .  Food insecurity - inability: Not on file  . Transportation needs - medical: Not on file  . Transportation needs - non-medical: Not on file  Occupational History  . Occupation: Health visitor: [employer]  Tobacco Use  . Smoking status: Current Some Day Smoker    Types: Cigars  . Smokeless tobacco: Current User    Types: Snuff  Substance and Sexual Activity  . Alcohol use: Yes    Comment:  a couple beers a few times a week   . Drug use: No    Comment: denies THC, cocainse, stimulants, pain meds,  benzos, synthetic drugs  . Sexual activity: No  Other Topics Concern  . Not on file  Social History Narrative   Born in [place] and grew up in [place]. Currently resides in a house with his wife. 1 dog, 1 snake, 1 cat. 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    Denies religious beliefs that would effect health care.     Family History  Problem Relation Age of Onset  . Prostate cancer Paternal Grandfather   . Cancer Paternal Aunt        lymph cancer  . Alcohol abuse Father   . Alcohol abuse Sister   . Colon cancer Neg Hx   . CAD Neg Hx   . Pancreatic cancer Neg Hx   . Rectal cancer Neg Hx   . Stomach cancer Neg Hx   . Drug abuse Neg Hx   . Anxiety disorder Neg Hx   . Bipolar disorder Neg Hx   . Depression Neg Hx     ROS:  Knee arthralgias but no fevers or chills, productive cough, hemoptysis, dysphasia, odynophagia, melena, hematochezia, dysuria, hematuria, rash, seizure activity, orthopnea, PND, pedal edema, claudication. Remaining systems are negative.  Physical Exam:   Blood pressure 130/84, pulse 89, height 5\' 9"  (1.753 m), weight 254 lb (115.2 kg).  General:  Well developed/well nourished in NAD Skin warm/dry Patient not depressed No peripheral clubbing Back-normal HEENT-normal/normal eyelids Neck supple/normal carotid upstroke bilaterally; no bruits; no JVD; no thyromegaly chest - CTA/ normal expansion CV - RRR/normal S1 and S2; no murmurs, rubs or gallops;  PMI nondisplaced Abdomen -NT/ND, no HSM, no mass, + bowel sounds, no bruit 2+ femoral pulses, no bruits Ext-no edema, chords, 2+ DP Neuro-grossly nonfocal  ECG - 04/19/2017-sinus rhythm, left anterior fascicular block, left ventricular hypertrophy, cannot rule out prior anterior infarct. personally reviewed  Today's electrocardiogram shows sinus rhythm, Left axis deviation, prior septal infarct cannot be excluded.  A/P  1 Chest pain-symptoms are atypical. He does have left ventricular hypertrophy on his  baseline electrocardiogram and prior anterior infarct cannot be excluded. I will arrange a stress echocardiogram for risk stratification. This will also help evaluate wall motion.  2 Carotid artery disease-we will arrange follow-up carotid Dopplers. Continue aspirin and statin.  3 Hyperlipidemia-continue statin. Lipids and liver monitored by primary care.  4 tobacco use-patient occasionally smokes cigars. We discussed the importance of avoiding. We also discussed the importance of weight loss and exercise.  Kirk Ruths, MD

## 2017-05-21 IMAGING — CT CT ABD-PELV W/ CM
2 of 5 series · 16 of 46 positions shown, 18 images · IV contrast (iopamidol)
Comparison: Abdominal CT dated 02/14/2013

CLINICAL DATA: 54-year-old male with history of diverticulitis
presenting with abdominal pain.

EXAM:
CT ABDOMEN AND PELVIS WITH CONTRAST
TECHNIQUE: Multidetector CT imaging of the abdomen and pelvis was performed
using the standard protocol following bolus administration of
intravenous contrast.
CONTRAST:  100mL [id number] IOPAMIDOL ([id number]) INJECTION 61%

[Series 2: abd/pel with · axial · 0.83mm/px · z∈[-791,-351]mm · 13 of 102 slices shown, 15 images]
[im 7/102  soft-tissue]
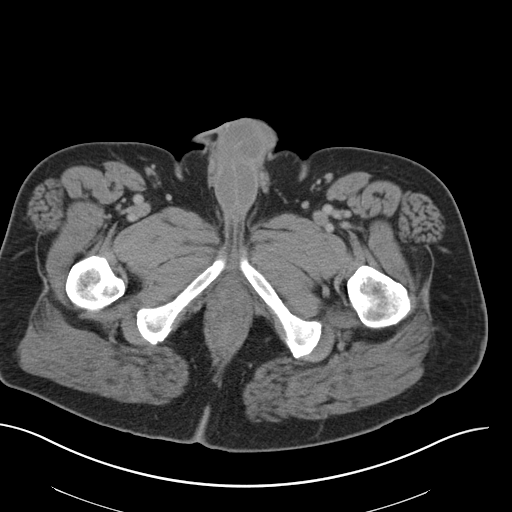
[im 7/102  bone]
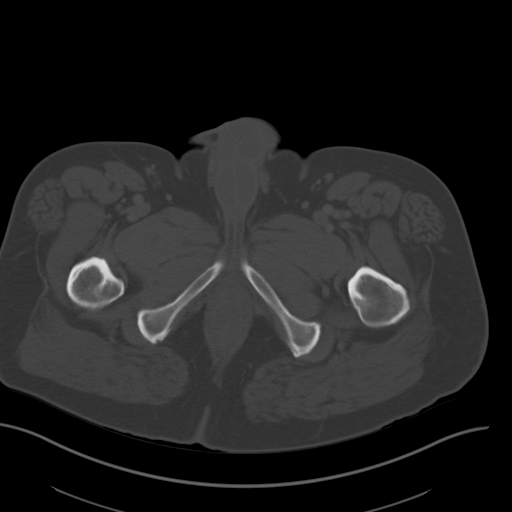
[im 13/102  soft-tissue]
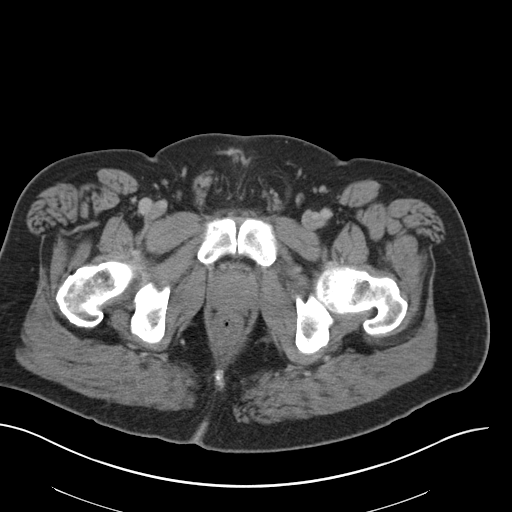
[im 19/102  soft-tissue]
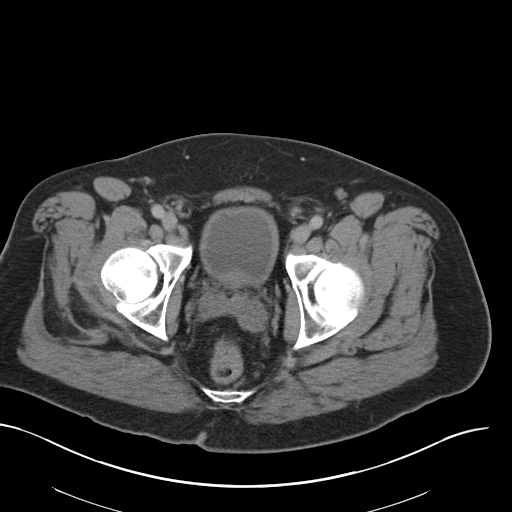
[im 32/102  soft-tissue]
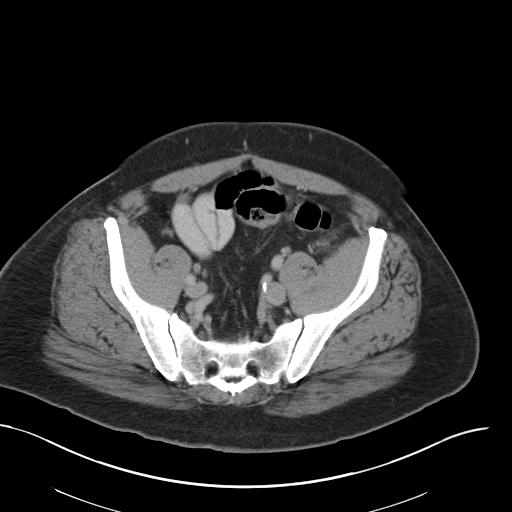
[im 38/102  soft-tissue]
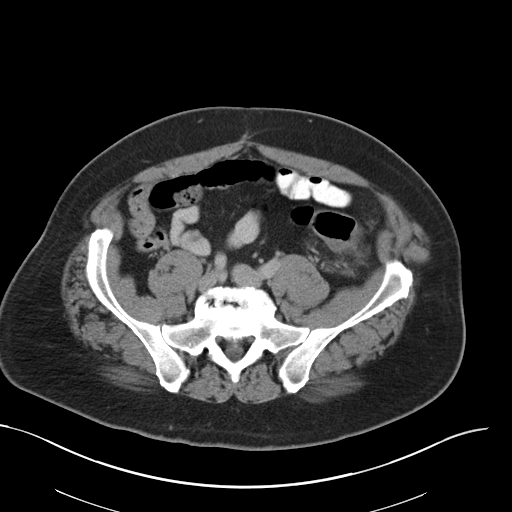
[im 45/102  soft-tissue]
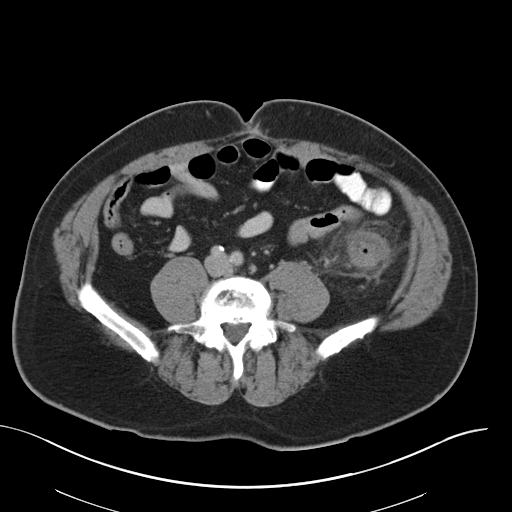
[im 51/102  soft-tissue]
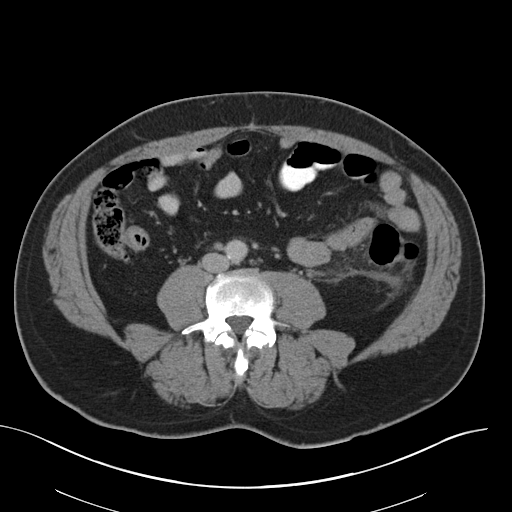
[im 57/102  soft-tissue]
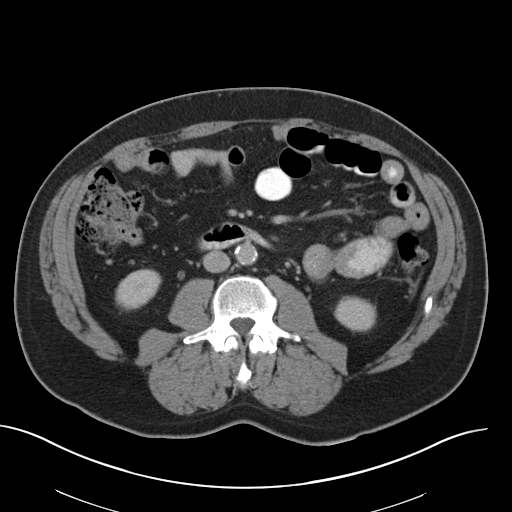
[im 64/102  soft-tissue]
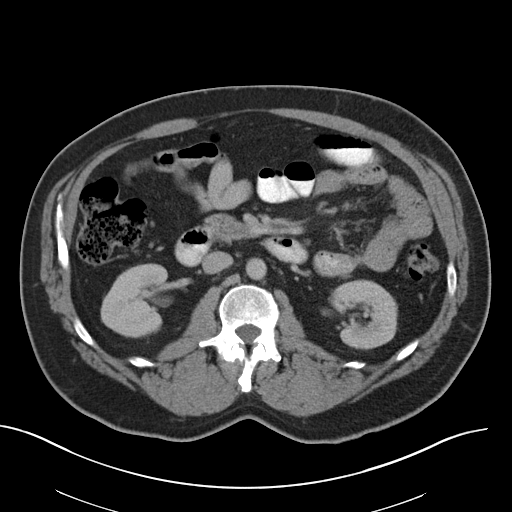
[im 64/102  bone]
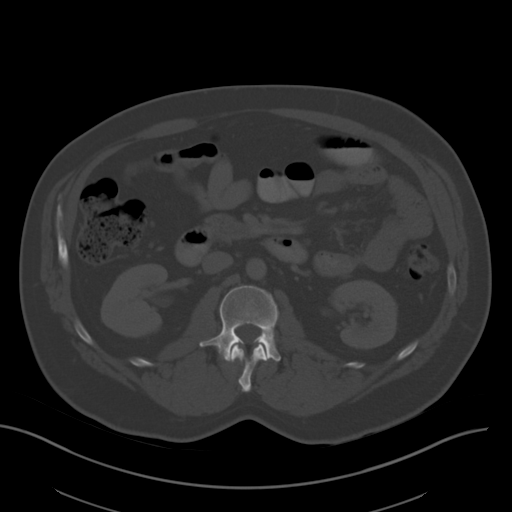
[im 70/102  soft-tissue]
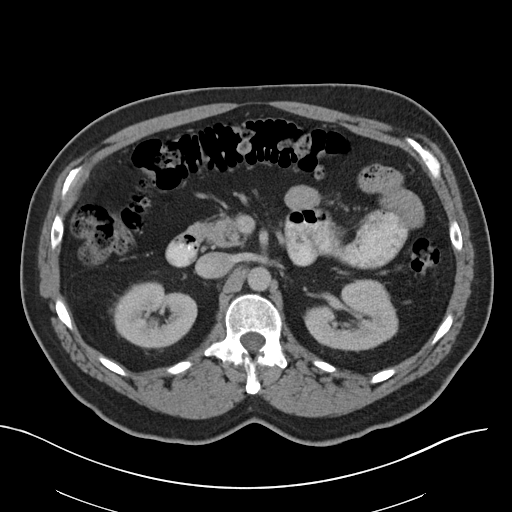
[im 83/102  soft-tissue]
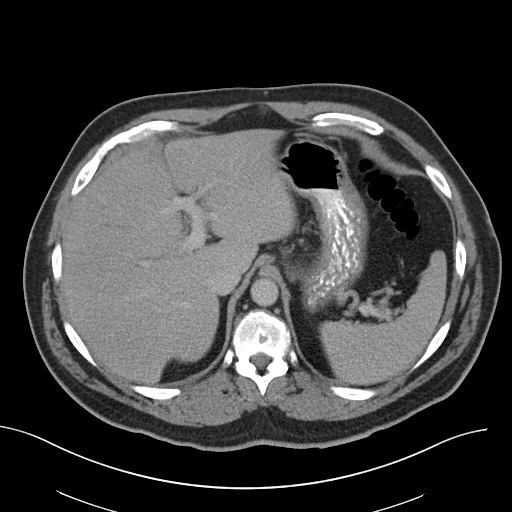
[im 89/102  soft-tissue]
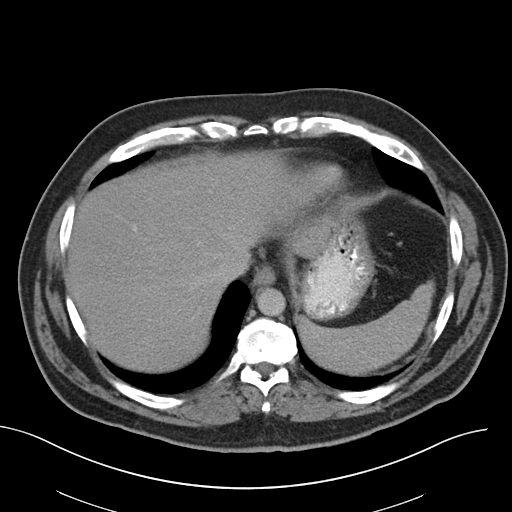
[im 95/102  soft-tissue]
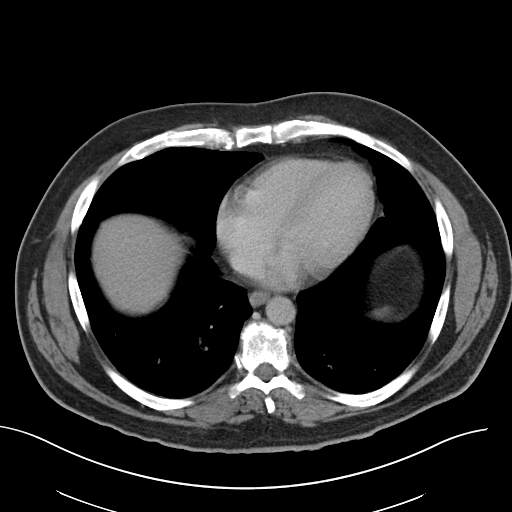

[Series 5: coronal a/|p · coronal · 0.80mm/px · 3 of 156 slices shown]
[im 52/156  soft-tissue]
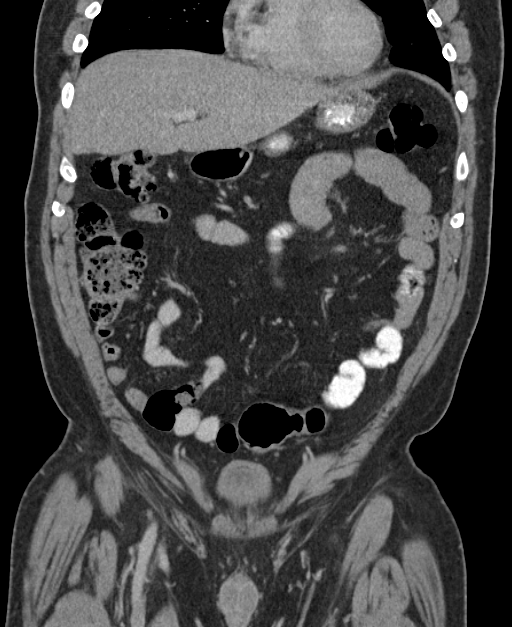
[im 69/156  soft-tissue]
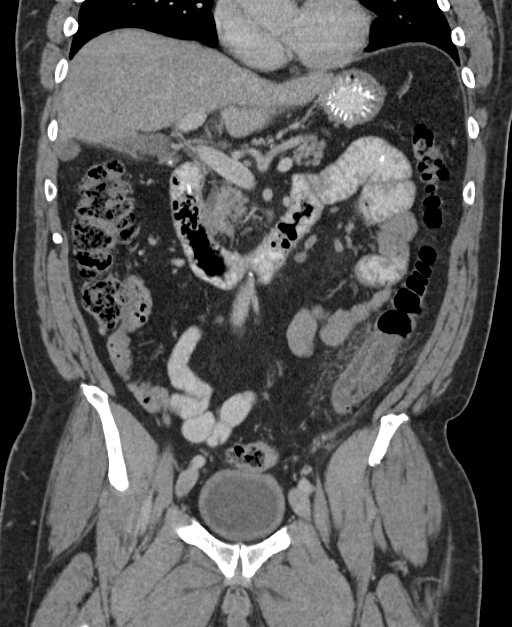
[im 87/156  soft-tissue]
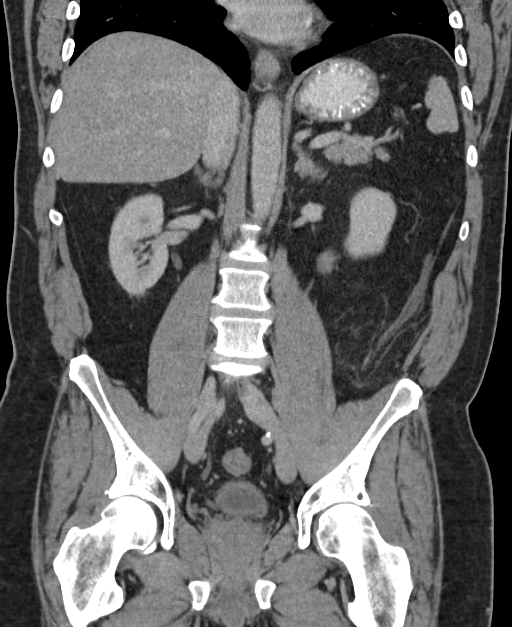

[16 of 46 positions shown; findings below may reference images not displayed]

FINDINGS: The visualized lung bases are clear. No intra-abdominal free air or
free fluid noted.

Multiple small gallstones. No pericholecystic fluid. The liver,
pancreas, spleen, adrenal glands, kidneys, visualized ureters, and
urinary bladder appear unremarkable. The prostate and seminal
vesicles are grossly unremarkable.

There are small scattered colonic diverticula. There is inflammatory
changes and circumferential thickening of a 6 cm segment of the
distal descending colon. Multiple small diverticula noted in this
region on the prior CT. This focal area of inflammatory changes most
likely represents diverticulitis and less likely focal colitis.
Underlying mass is less likely but not excluded. Clinical
correlation and follow-up recommended. No drainable fluid
collection/abscess or evidence of perforation. There is no evidence
of bowel obstruction. Normal appendix.

There is mild aortoiliac atherosclerotic disease. The origins of the
celiac axis, SMA, IMA as well as the origins of the renal arteries
are patent. The SMV, splenic vein, and main portal veins are patent.
No portal venous gas identified. There is no adenopathy. The
abdominal wall soft tissues appear unremarkable. There is
degenerative changes of the spine. No acute fracture.
IMPRESSION: Acute diverticulitis versus less likely focal segmental colitis of
the distal descending colon. No abscess. Follow-up recommended to
exclude underlying mass.

Cholelithiasis.

## 2017-05-25 ENCOUNTER — Telehealth (HOSPITAL_COMMUNITY): Payer: Self-pay | Admitting: *Deleted

## 2017-05-25 NOTE — Telephone Encounter (Signed)
Left message on voicemail per DPR in reference to upcoming appointment scheduled on 05/31/17 at 2:30 with detailed instructions given per Stress Test Requisition Sheet for the test. LM to arrive 30 minutes early, and that it is imperative to arrive on time for appointment to keep from having the test rescheduled. If you need to cancel or reschedule your appointment, please call the office within 24 hours of your appointment. Failure to do so may result in a cancellation of your appointment, and a $50 no show fee. Phone number given for call back for any questions. Harry Carroll

## 2017-05-29 ENCOUNTER — Ambulatory Visit (HOSPITAL_COMMUNITY)
Admission: RE | Admit: 2017-05-29 | Discharge: 2017-05-29 | Disposition: A | Discharge status: Home / Self Care | Payer: 59 | Source: Ambulatory Visit | Attending: Internal Medicine | Admitting: Internal Medicine

## 2017-05-29 DIAGNOSIS — I6523 Occlusion and stenosis of bilateral carotid arteries: Secondary | ICD-10-CM

## 2017-05-31 ENCOUNTER — Ambulatory Visit (HOSPITAL_COMMUNITY): Payer: 59 | Attending: Internal Medicine

## 2017-05-31 ENCOUNTER — Ambulatory Visit (HOSPITAL_BASED_OUTPATIENT_CLINIC_OR_DEPARTMENT_OTHER): Payer: 59

## 2017-05-31 DIAGNOSIS — R072 Precordial pain: Secondary | ICD-10-CM | POA: Diagnosis not present

## 2017-05-31 MED ORDER — PERFLUTREN LIPID MICROSPHERE
1.0000 mL | INTRAVENOUS | Status: AC | PRN
  Administered 2017-05-31: 2 mL via INTRAVENOUS
  Administered 2017-05-31: 8 mL via INTRAVENOUS

## 2017-06-22 ENCOUNTER — Other Ambulatory Visit: Payer: Self-pay | Admitting: Internal Medicine

## 2017-06-22 ENCOUNTER — Encounter: Payer: Self-pay | Admitting: *Deleted

## 2017-06-22 ENCOUNTER — Ambulatory Visit: Payer: 59 | Admitting: Internal Medicine

## 2017-06-22 ENCOUNTER — Encounter: Payer: Self-pay | Admitting: Internal Medicine

## 2017-06-22 ENCOUNTER — Telehealth: Payer: Self-pay

## 2017-06-22 ENCOUNTER — Other Ambulatory Visit (INDEPENDENT_AMBULATORY_CARE_PROVIDER_SITE_OTHER): Payer: 59

## 2017-06-22 VITALS — BP 132/86 | HR 93 | Temp 98.0°F | Ht 69.0 in | Wt 258.0 lb

## 2017-06-22 DIAGNOSIS — Z0000 Encounter for general adult medical examination without abnormal findings: Secondary | ICD-10-CM | POA: Diagnosis not present

## 2017-06-22 DIAGNOSIS — R7303 Prediabetes: Secondary | ICD-10-CM | POA: Insufficient documentation

## 2017-06-22 DIAGNOSIS — E7800 Pure hypercholesterolemia, unspecified: Secondary | ICD-10-CM

## 2017-06-22 DIAGNOSIS — R739 Hyperglycemia, unspecified: Secondary | ICD-10-CM

## 2017-06-22 DIAGNOSIS — Z114 Encounter for screening for human immunodeficiency virus [HIV]: Secondary | ICD-10-CM

## 2017-06-22 LAB — HEPATIC FUNCTION PANEL
ALK PHOS: 38 U/L — AB (ref 39–117)
ALT: 94 U/L — ABNORMAL HIGH (ref 0–53)
AST: 43 U/L — AB (ref 0–37)
Albumin: 4.7 g/dL (ref 3.5–5.2)
BILIRUBIN TOTAL: 1 mg/dL (ref 0.2–1.2)
Bilirubin, Direct: 0.2 mg/dL (ref 0.0–0.3)
Total Protein: 7.2 g/dL (ref 6.0–8.3)

## 2017-06-22 LAB — URINALYSIS, ROUTINE W REFLEX MICROSCOPIC
Bilirubin Urine: NEGATIVE
Hgb urine dipstick: NEGATIVE
Ketones, ur: NEGATIVE
LEUKOCYTES UA: NEGATIVE
Nitrite: NEGATIVE
PH: 6.5 (ref 5.0–8.0)
RBC / HPF: NONE SEEN (ref 0–?)
SPECIFIC GRAVITY, URINE: 1.02 (ref 1.000–1.030)
Urine Glucose: NEGATIVE
Urobilinogen, UA: 0.2 (ref 0.0–1.0)

## 2017-06-22 LAB — CBC WITH DIFFERENTIAL/PLATELET
Basophils Absolute: 0.1 10*3/uL (ref 0.0–0.1)
Basophils Relative: 0.9 % (ref 0.0–3.0)
EOS PCT: 2 % (ref 0.0–5.0)
Eosinophils Absolute: 0.1 10*3/uL (ref 0.0–0.7)
HCT: 48.6 % (ref 39.0–52.0)
Hemoglobin: 16.6 g/dL (ref 13.0–17.0)
LYMPHS ABS: 1.9 10*3/uL (ref 0.7–4.0)
Lymphocytes Relative: 33 % (ref 12.0–46.0)
MCHC: 34.1 g/dL (ref 30.0–36.0)
MCV: 93.8 fl (ref 78.0–100.0)
MONO ABS: 0.4 10*3/uL (ref 0.1–1.0)
Monocytes Relative: 6.4 % (ref 3.0–12.0)
NEUTROS ABS: 3.3 10*3/uL (ref 1.4–7.7)
NEUTROS PCT: 57.7 % (ref 43.0–77.0)
PLATELETS: 225 10*3/uL (ref 150.0–400.0)
RBC: 5.18 Mil/uL (ref 4.22–5.81)
RDW: 12.7 % (ref 11.5–15.5)
WBC: 5.8 10*3/uL (ref 4.0–10.5)

## 2017-06-22 LAB — LIPID PANEL
CHOL/HDL RATIO: 5
CHOLESTEROL: 232 mg/dL — AB (ref 0–200)
HDL: 45.6 mg/dL (ref >39.00)
LDL CALC: 150 mg/dL — AB (ref 0–99)
NonHDL: 186.53
Triglycerides: 181 mg/dL — ABNORMAL HIGH (ref 0.0–149.0)
VLDL: 36.2 mg/dL (ref 0.0–40.0)

## 2017-06-22 LAB — HEMOGLOBIN A1C: Hgb A1c MFr Bld: 5.8 % (ref 4.6–6.5)

## 2017-06-22 LAB — PSA: PSA: 2.19 ng/mL (ref 0.10–4.00)

## 2017-06-22 LAB — BASIC METABOLIC PANEL
BUN: 16 mg/dL (ref 6–23)
CO2: 28 mEq/L (ref 19–32)
Calcium: 10 mg/dL (ref 8.4–10.5)
Chloride: 102 mEq/L (ref 96–112)
Creatinine, Ser: 1.02 mg/dL (ref 0.40–1.50)
GFR: 79.7 mL/min (ref >60.00)
Glucose, Bld: 96 mg/dL (ref 70–99)
POTASSIUM: 4.8 meq/L (ref 3.5–5.1)
Sodium: 138 mEq/L (ref 135–145)

## 2017-06-22 LAB — TSH: TSH: 2.51 u[IU]/mL (ref 0.35–4.50)

## 2017-06-22 MED ORDER — ROSUVASTATIN CALCIUM 20 MG PO TABS: 20.0000 mg | ORAL_TABLET | Freq: Every day | ORAL | 3 refills | Status: DC

## 2017-06-22 NOTE — Patient Instructions (Signed)

## 2017-06-22 NOTE — Telephone Encounter (Signed)
Called pt, LVM   CRM Created  

## 2017-06-22 NOTE — Telephone Encounter (Signed)
This encounter was created in error - please disregard.

## 2017-06-22 NOTE — Progress Notes (Signed)
Subjective:    Patient ID: Harry Carroll, male    DOB: [birth year], 57 y.o.   MRN: [id number]  HPI  Here for wellness and f/u;  Overall doing ok;  Pt denies Chest pain, worsening SOB, DOE, wheezing, orthopnea, PND, worsening LE edema, palpitations, dizziness or syncope.  Pt denies neurological change such as new headache, facial or extremity weakness.  Pt denies polydipsia, polyuria, or low sugar symptoms. Pt states overall good compliance with treatment and medications, good tolerability, and has been trying to follow appropriate diet.  Pt denies worsening depressive symptoms, suicidal ideation or panic. No fever, night sweats, wt loss, loss of appetite, or other constitutional symptoms.  Pt states good ability with ADL's, has low fall risk, home safety reviewed and adequate, no other significant changes in hearing or vision, and not active with exercise due to chronic right knee pain and will likely soon need right knee TKR per ortho.   Good compliance with statin.   No other interval hx or new complaints Past Medical History:  Diagnosis Date  . Arthritis   . Bipolar 1 disorder (HCC)    Dr Clovis Pu   . Carpal tunnel syndrome    saw Dr Kristie Cowman   . Colon polyps    adenomatous  . Diverticulitis   . Heart murmur   . Hyperlipemia   . Insomnia   . Normal cardiac stress test    CP 01-2009---Nl ECHO and stress test neg  . Restless leg   . Sleep apnea    Past Surgical History:  Procedure Laterality Date  . COLONOSCOPY WITH PROPOFOL N/A 04/02/2014   Procedure: COLONOSCOPY WITH PROPOFOL;  Surgeon: Inda Castle, MD;  Location: [facility];  Service: Endoscopy;  Laterality: N/A;  . CYSTOSCOPY WITH URETHRAL DILATATION  12/20/2011   Procedure: CYSTOSCOPY WITH URETHRAL DILATATION;  Surgeon: Earnstine Regal, MD;  Location: [facility];  Service: General;;  with foley insertion  . CYSTOSCOPY WITH URETHRAL DILATATION  12/20/2011   Procedure: CYSTOSCOPY WITH URETHRAL DILATATION;  Surgeon: Franchot Gallo, MD;   Location: [facility];  Service: Urology;;  . HOT HEMOSTASIS N/A 04/02/2014   Procedure: HOT HEMOSTASIS (ARGON PLASMA COAGULATION/BICAP);  Surgeon: Inda Castle, MD;  Location: [facility];  Service: Endoscopy;  Laterality: N/A;  . maxillofacial surgery    . PARTIAL COLECTOMY  12/20/2011   Procedure: PARTIAL COLECTOMY;  Surgeon: Earnstine Regal, MD;  Location: [facility];  Service: General;  Laterality: N/A;  Sigmoid colectomy  . right knee arthroscopy     2007  . right shoulder surgery     X2  . SHOULDER SURGERY     LEFT    reports that he has been smoking cigars.  His smokeless tobacco use includes snuff. He reports that he drinks alcohol. He reports that he does not use drugs. family history includes Alcohol abuse in his father and sister; Cancer in his paternal aunt; Prostate cancer in his paternal grandfather. Allergies  Allergen Reactions  . Ciprofloxacin Shortness Of Breath, Nausea And Vomiting, Nausea Only, Rash and Other (See Comments)    Marked mucous production  . Dilaudid [Hydromorphone Hcl] Shortness Of Breath and Nausea Only    Patient developed rash, excessive mucous production and chest pain.  Marland Kitchen Hydrocodone Itching   Current Outpatient Medications on File Prior to Visit  Medication Sig Dispense Refill  . aspirin EC 81 MG tablet Take 81 mg daily by mouth.     No current facility-administered medications on file prior to visit.  Review of Systems Constitutional: Negative for other unusual diaphoresis, sweats, appetite or weight changes HENT: Negative for other worsening hearing loss, ear pain, facial swelling, mouth sores or neck stiffness.   Eyes: Negative for other worsening pain, redness or other visual disturbance.  Respiratory: Negative for other stridor or swelling Cardiovascular: Negative for other palpitations or other chest pain  Gastrointestinal: Negative for worsening diarrhea or loose stools, blood in stool, distention or other pain Genitourinary: Negative for  hematuria, flank pain or other change in urine volume.  Musculoskeletal: Negative for myalgias or other joint swelling.  Skin: Negative for other color change, or other wound or worsening drainage.  Neurological: Negative for other syncope or numbness. Hematological: Negative for other adenopathy or swelling Psychiatric/Behavioral: Negative for hallucinations, other worsening agitation, SI, self-injury, or new decreased concentration All other system neg per pt    Objective:   Physical Exam BP 132/86   Pulse 93   Temp 98 F (36.7 C) (Oral)   Ht 5\' 9"  (1.753 m)   Wt 258 lb (117 kg)   SpO2 98%   BMI 38.10 kg/m  VS noted,  Constitutional: Pt is oriented to person, place, and time. Appears well-developed and well-nourished, in no significant distress and comfortable Head: Normocephalic and atraumatic  Eyes: Conjunctivae and EOM are normal. Pupils are equal, round, and reactive to light Right Ear: External ear normal without discharge Left Ear: External ear normal without discharge Nose: Nose without discharge or deformity Mouth/Throat: Oropharynx is without other ulcerations and moist  Neck: Normal range of motion. Neck supple. No JVD present. No tracheal deviation present or significant neck LA or mass Cardiovascular: Normal rate, regular rhythm, normal heart sounds and intact distal pulses.   Pulmonary/Chest: WOB normal and breath sounds without rales or wheezing  Abdominal: Soft. Bowel sounds are normal. NT. No HSM  Musculoskeletal: Normal range of motion. Exhibits no edema Lymphadenopathy: Has no other cervical adenopathy.  Neurological: Pt is alert and oriented to person, place, and time. Pt has normal reflexes. No cranial nerve deficit. Motor grossly intact, Gait intact Skin: Skin is warm and dry. No rash noted or new ulcerations Psychiatric:  Has normal mood and affect. Behavior is normal without agitation No other exam findings    Assessment & Plan:

## 2017-06-22 NOTE — Telephone Encounter (Signed)
-----   Message from Biagio Borg, MD sent at 06/22/2017  1:07 PM EST ----- Left message on MyChart, pt to cont same tx except  The test results show that your current treatment is OK, except the LDL cholesterol is very high.  It appears the Pravachol is not working.  We will need to change this to a more effective medication called Crestor 20 mg per day.  I will send a prescription, and you should hear from the office as well.Redmond Baseman to please inform pt, I will do rx

## 2017-06-23 LAB — HIV ANTIBODY (ROUTINE TESTING W REFLEX): HIV: NONREACTIVE

## 2017-06-24 ENCOUNTER — Encounter: Payer: Self-pay | Admitting: Internal Medicine

## 2017-06-24 NOTE — Assessment & Plan Note (Signed)

## 2017-06-24 NOTE — Assessment & Plan Note (Signed)
For f/u lipids, goal ldl < 100

## 2017-06-24 NOTE — Assessment & Plan Note (Signed)
Lab Results  Component Value Date   HGBA1C 5.8 06/22/2017  stable overall by history and exam, recent data reviewed with pt, and pt to continue medical treatment as before,  to f/u any worsening symptoms or concerns

## 2017-07-18 ENCOUNTER — Telehealth: Payer: 59 | Admitting: Family

## 2017-07-18 DIAGNOSIS — R059 Cough, unspecified: Secondary | ICD-10-CM

## 2017-07-18 DIAGNOSIS — R05 Cough: Secondary | ICD-10-CM

## 2017-07-18 MED ORDER — AZITHROMYCIN 250 MG PO TABS: ORAL_TABLET | ORAL | 0 refills | Status: DC

## 2017-07-18 NOTE — Progress Notes (Signed)
We are sorry that you are not feeling well.  Here is how we plan to help!  Based on your presentation I believe you most likely have A cough due to bacteria.  When patients have a fever and a productive cough with a change in color or increased sputum production, we are concerned about bacterial bronchitis.  If left untreated it can progress to pneumonia.  If your symptoms do not improve with your treatment plan it is important that you contact your provider.   I have prescribed Doxycycline 100 mg twice a day for 7 days     In addition you may use A non-prescription cough medication called Robitussin DAC. Take 2 teaspoons every 8 hours or Delsym: take 2 teaspoons every 12 hours.   From your responses in the eVisit questionnaire you describe inflammation in the upper respiratory tract which is causing a significant cough.  This is commonly called Bronchitis and has four common causes:    Allergies  Viral Infections  Acid Reflux  Bacterial Infection Allergies, viruses and acid reflux are treated by controlling symptoms or eliminating the cause. An example might be a cough caused by taking certain blood pressure medications. You stop the cough by changing the medication. Another example might be a cough caused by acid reflux. Controlling the reflux helps control the cough.  USE OF BRONCHODILATOR ("RESCUE") INHALERS: There is a risk from using your bronchodilator too frequently.  The risk is that over-reliance on a medication which only relaxes the muscles surrounding the breathing tubes can reduce the effectiveness of medications prescribed to reduce swelling and congestion of the tubes themselves.  Although you feel brief relief from the bronchodilator inhaler, your asthma may actually be worsening with the tubes becoming more swollen and filled with mucus.  This can delay other crucial treatments, such as oral steroid medications. If you need to use a bronchodilator inhaler daily, several times per  day, you should discuss this with your provider.  There are probably better treatments that could be used to keep your asthma under control.     HOME CARE . Only take medications as instructed by your medical team. . Complete the entire course of an antibiotic. . Drink plenty of fluids and get plenty of rest. . Avoid close contacts especially the very young and the elderly . Cover your mouth if you cough or cough into your sleeve. . Always remember to wash your hands . A steam or ultrasonic humidifier can help congestion.   GET HELP RIGHT AWAY IF: . You develop worsening fever. . You become short of breath . You cough up blood. . Your symptoms persist after you have completed your treatment plan MAKE SURE YOU   Understand these instructions.  Will watch your condition.  Will get help right away if you are not doing well or get worse.  Your e-visit answers were reviewed by a board certified advanced clinical practitioner to complete your personal care plan.  Depending on the condition, your plan could have included both over the counter or prescription medications. If there is a problem please reply  once you have received a response from your provider. Your safety is important to us.  If you have drug allergies check your prescription carefully.    You can use MyChart to ask questions about today's visit, request a non-urgent call back, or ask for a work or school excuse for 24 hours related to this e-Visit. If it has been greater than 24 hours you   you will need to follow up with your provider, or enter a new e-Visit to address those concerns. You will get an e-mail in the next two days asking about your experience.  I hope that your e-visit has been valuable and will speed your recovery. Thank you for using e-visits.

## 2017-08-01 DIAGNOSIS — M170 Bilateral primary osteoarthritis of knee: Secondary | ICD-10-CM | POA: Diagnosis not present

## 2017-08-01 DIAGNOSIS — M25562 Pain in left knee: Secondary | ICD-10-CM | POA: Diagnosis not present

## 2017-08-01 DIAGNOSIS — M25561 Pain in right knee: Secondary | ICD-10-CM | POA: Diagnosis not present

## 2017-08-03 ENCOUNTER — Ambulatory Visit: Payer: Self-pay | Admitting: Internal Medicine

## 2017-08-04 ENCOUNTER — Ambulatory Visit: Payer: Self-pay | Admitting: Internal Medicine

## 2017-08-08 DIAGNOSIS — M1712 Unilateral primary osteoarthritis, left knee: Secondary | ICD-10-CM | POA: Diagnosis not present

## 2017-08-08 DIAGNOSIS — M25562 Pain in left knee: Secondary | ICD-10-CM | POA: Diagnosis not present

## 2017-08-15 DIAGNOSIS — M25562 Pain in left knee: Secondary | ICD-10-CM | POA: Diagnosis not present

## 2017-08-15 DIAGNOSIS — M1712 Unilateral primary osteoarthritis, left knee: Secondary | ICD-10-CM | POA: Diagnosis not present

## 2017-08-16 DIAGNOSIS — M1711 Unilateral primary osteoarthritis, right knee: Secondary | ICD-10-CM | POA: Diagnosis not present

## 2017-08-16 DIAGNOSIS — M25561 Pain in right knee: Secondary | ICD-10-CM | POA: Diagnosis not present

## 2017-08-22 DIAGNOSIS — M1712 Unilateral primary osteoarthritis, left knee: Secondary | ICD-10-CM | POA: Diagnosis not present

## 2017-08-22 DIAGNOSIS — M25562 Pain in left knee: Secondary | ICD-10-CM | POA: Diagnosis not present

## 2017-08-23 DIAGNOSIS — M1711 Unilateral primary osteoarthritis, right knee: Secondary | ICD-10-CM | POA: Diagnosis not present

## 2017-08-23 DIAGNOSIS — M25561 Pain in right knee: Secondary | ICD-10-CM | POA: Diagnosis not present

## 2017-08-31 DIAGNOSIS — M25561 Pain in right knee: Secondary | ICD-10-CM | POA: Diagnosis not present

## 2017-08-31 DIAGNOSIS — M1711 Unilateral primary osteoarthritis, right knee: Secondary | ICD-10-CM | POA: Diagnosis not present

## 2017-09-22 DIAGNOSIS — M25562 Pain in left knee: Secondary | ICD-10-CM | POA: Diagnosis not present

## 2017-09-22 DIAGNOSIS — M1712 Unilateral primary osteoarthritis, left knee: Secondary | ICD-10-CM | POA: Diagnosis not present

## 2017-09-22 DIAGNOSIS — M1711 Unilateral primary osteoarthritis, right knee: Secondary | ICD-10-CM | POA: Diagnosis not present

## 2017-09-27 ENCOUNTER — Emergency Department (HOSPITAL_COMMUNITY)
Admission: EM | Admit: 2017-09-27 | Discharge: 2017-09-27 | Disposition: A | Discharge status: Home / Self Care | Payer: 59 | Attending: Emergency Medicine | Admitting: Emergency Medicine

## 2017-09-27 ENCOUNTER — Encounter (HOSPITAL_COMMUNITY): Payer: Self-pay | Admitting: *Deleted

## 2017-09-27 ENCOUNTER — Other Ambulatory Visit: Payer: Self-pay

## 2017-09-27 DIAGNOSIS — S301XXA Contusion of abdominal wall, initial encounter: Secondary | ICD-10-CM | POA: Diagnosis not present

## 2017-09-27 DIAGNOSIS — Z79899 Other long term (current) drug therapy: Secondary | ICD-10-CM | POA: Insufficient documentation

## 2017-09-27 DIAGNOSIS — F17290 Nicotine dependence, other tobacco product, uncomplicated: Secondary | ICD-10-CM | POA: Insufficient documentation

## 2017-09-27 DIAGNOSIS — Z7982 Long term (current) use of aspirin: Secondary | ICD-10-CM | POA: Diagnosis not present

## 2017-09-27 DIAGNOSIS — Y999 Unspecified external cause status: Secondary | ICD-10-CM | POA: Insufficient documentation

## 2017-09-27 DIAGNOSIS — Y929 Unspecified place or not applicable: Secondary | ICD-10-CM | POA: Diagnosis not present

## 2017-09-27 DIAGNOSIS — W0110XA Fall on same level from slipping, tripping and stumbling with subsequent striking against unspecified object, initial encounter: Secondary | ICD-10-CM | POA: Diagnosis not present

## 2017-09-27 DIAGNOSIS — Y9301 Activity, walking, marching and hiking: Secondary | ICD-10-CM | POA: Insufficient documentation

## 2017-09-27 DIAGNOSIS — S3991XA Unspecified injury of abdomen, initial encounter: Secondary | ICD-10-CM | POA: Diagnosis present

## 2017-09-27 NOTE — ED Provider Notes (Signed)
[place] DEPT Provider Note   CSN: [id number] Arrival date & time: 09/27/17  1287     History   Chief Complaint Chief Complaint  Patient presents with  . Fall    HPI Harry Carroll is a 57 y.o. male.  57 yo M with a chief complaint of a fall.  The patient was mowing the lawn yesterday and slipped on wet grass and fell backwards and landed on his back.  Denies loss consciousness denies chest pain back pain abdominal pain.  Today his wife noted that he had a bruise to the left lower quadrant.  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  Denies abdominal pain prior to that event.  Denies vomiting denies dark stool or blood in stool.  Denies weakness or feeling like he may pass out.  The history is provided by the patient.  Fall  This is a new problem. The current episode started yesterday. The problem occurs rarely. The problem has been resolved. Pertinent negatives include no chest pain, no abdominal pain, no headaches and no shortness of breath. Nothing aggravates the symptoms. Nothing relieves the symptoms. He has tried nothing for the symptoms.    Past Medical History:  Diagnosis Date  . Arthritis   . Bipolar 1 disorder (HCC)    Dr Clovis Pu   . Carpal tunnel syndrome    saw Dr Kristie Cowman   . Colon polyps    adenomatous  . Diverticulitis   . Heart murmur   . Hyperlipemia   . Insomnia   . Normal cardiac stress test    CP 01-2009---Nl ECHO and stress test neg  . Restless leg   . Sleep apnea     Patient Active Problem List   Diagnosis Date Noted  . Hyperglycemia 06/22/2017  . Numbness and tingling in right hand 01/04/2017  . Low testosterone 10/03/2016  . Obesity 03/28/2016  . Bilateral wrist pain 12/25/2015  . Acute diverticulitis 10/21/2015  . Numbness and tingling of both upper extremities while sleeping 08/27/2015  . Routine general medical examination at a health  care facility 03/24/2015  . Bipolar I disorder, most recent episode (or current) unspecified 02/26/2015  . Rib contusion 11/13/2014  . Ganglion cyst of wrist 06/26/2014  . History of colonic polyps 04/02/2014  . Diverticulitis of colon (without mention of hemorrhage)(562.11) 01/17/2014  . Chest pain 09/04/2013  . Abdominal pain, left lower quadrant 02/18/2013  . Dizziness 03/29/2012  . Diverticulitis-post resection July 2013 11/27/2011  . OSA (obstructive sleep apnea) 11/17/2011  . Fatigue 10/11/2011  . Personal history of colonic polyps 02/18/2011  . HYPERTRIGLYCERIDEMIA 02/20/2009  . BIPOLAR DISORDER UNSPECIFIED 11/29/2007  . INSOMNIA 11/29/2007  . LIVER FUNCTION TESTS, ABNORMAL, HX OF 11/29/2007  . DEPRESSION 07/07/2007  . Hyperlipidemia 04/16/2007    Past Surgical History:  Procedure Laterality Date  . COLONOSCOPY WITH PROPOFOL N/A 04/02/2014   Procedure: COLONOSCOPY WITH PROPOFOL;  Surgeon: Inda Castle, MD;  Location: [facility];  Service: Endoscopy;  Laterality: N/A;  . CYSTOSCOPY WITH URETHRAL DILATATION  12/20/2011   Procedure: CYSTOSCOPY WITH URETHRAL DILATATION;  Surgeon: Earnstine Regal, MD;  Location: [facility];  Service: General;;  with foley insertion  . CYSTOSCOPY WITH URETHRAL DILATATION  12/20/2011   Procedure: CYSTOSCOPY WITH URETHRAL DILATATION;  Surgeon: Franchot Gallo, MD;  Location: [facility];  Service: Urology;;  . HOT HEMOSTASIS N/A 04/02/2014   Procedure: HOT  HEMOSTASIS (ARGON PLASMA COAGULATION/BICAP);  Surgeon: Inda Castle, MD;  Location: [facility];  Service: Endoscopy;  Laterality: N/A;  . maxillofacial surgery    . PARTIAL COLECTOMY  12/20/2011   Procedure: PARTIAL COLECTOMY;  Surgeon: Earnstine Regal, MD;  Location: [facility];  Service: General;  Laterality: N/A;  Sigmoid colectomy  . right knee arthroscopy     2007  . right shoulder surgery     X2  . SHOULDER SURGERY     LEFT        Home Medications    Prior to Admission medications     Medication Sig Start Date End Date Taking? Authorizing Provider  aspirin EC 81 MG tablet Take 81 mg daily by mouth.    [provider]  azithromycin (ZITHROMAX) 250 MG tablet 2 tabs today, then 1 tab daily x 4 more days 07/18/17   Dutch Quint B, FNP  rosuvastatin (CRESTOR) 20 MG tablet Take 1 tablet (20 mg total) by mouth daily. 06/22/17   Biagio Borg, MD    Family History Family History  Problem Relation Age of Onset  . Prostate cancer Paternal Grandfather   . Cancer Paternal Aunt        lymph cancer  . Alcohol abuse Father   . Alcohol abuse Sister   . Colon cancer Neg Hx   . CAD Neg Hx   . Pancreatic cancer Neg Hx   . Rectal cancer Neg Hx   . Stomach cancer Neg Hx   . Drug abuse Neg Hx   . Anxiety disorder Neg Hx   . Bipolar disorder Neg Hx   . Depression Neg Hx     Social History Social History   Tobacco Use  . Smoking status: Current Some Day Smoker    Types: Cigars  . Smokeless tobacco: Current User    Types: Snuff  Substance Use Topics  . Alcohol use: Yes    Comment:  a couple beers a few times a week   . Drug use: No    Comment: denies THC, cocainse, stimulants, pain meds, benzos, synthetic drugs     Allergies   Ciprofloxacin; Dilaudid [hydromorphone hcl]; and Hydrocodone   Review of Systems Review of Systems  Constitutional: Negative for chills and fever.  HENT: Negative for congestion and facial swelling.   Eyes: Negative for discharge and visual disturbance.  Respiratory: Negative for shortness of breath.   Cardiovascular: Negative for chest pain and palpitations.  Gastrointestinal: Negative for abdominal pain, diarrhea and vomiting.  Musculoskeletal: Negative for arthralgias and myalgias.  Skin: Negative for color change and rash.  Neurological: Negative for tremors, syncope and headaches.  Psychiatric/Behavioral: Negative for confusion and dysphoric mood.     Physical Exam Updated Vital Signs BP (!) 149/97 (BP Location: Right Arm)    Pulse 91   Temp 98 F (36.7 C) (Oral)   Resp 18   Ht 5\' 9"  (1.753 m)   Wt 113.4 kg (250 lb)   SpO2 100%   BMI 36.92 kg/m   Physical Exam  Constitutional: He is oriented to person, place, and time. He appears well-developed and well-nourished.  HENT:  Head: Normocephalic and atraumatic.  Eyes: Pupils are equal, round, and reactive to light. EOM are normal.  Neck: Normal range of motion. Neck supple. No JVD present.  Cardiovascular: Normal rate and regular rhythm. Exam reveals no gallop and no friction rub.  No murmur heard. Pulmonary/Chest: No respiratory distress. He has no wheezes.  Abdominal: He exhibits no  distension and no mass. There is tenderness. There is no rebound and no guarding.  Small focal hematoma to the left lower quadrant.  Mild tenderness overlying.  Musculoskeletal: Normal range of motion.  Neurological: He is alert and oriented to person, place, and time.  Skin: No rash noted. No pallor.  Psychiatric: He has a normal mood and affect. His behavior is normal.  Nursing note and vitals reviewed.    ED Treatments / Results  Labs (all labs ordered are listed, but only abnormal results are displayed) Labs Reviewed - No data to display  EKG None  Radiology No results found.  Procedures Procedures (including critical care time)  Medications Ordered in ED Medications - No data to display   Initial Impression / Assessment and Plan / ED Course  I have reviewed the triage vital signs and the nursing notes.  Pertinent labs & imaging results that were available during my care of the patient were reviewed by me and considered in my medical decision making (see chart for details).     57 yo M with a chief complaint of left lower abdominal wall hematoma.  This is likely post fall from yesterday.  He has no significant abdominal tenderness.  Shared decision making at bedside option to do watchful waiting or for labs and a CAT scan to evaluate for  intra-abdominal bleeding.  Discussed with him how I think this is unlikely he is having no significant abdominal pain vital signs are stable.  The patient at this time will like to do watchful waiting.  Follow-up with his PCP.  11:06 AM:  I have discussed the diagnosis/risks/treatment options with the patient and believe the pt to be eligible for discharge home to follow-up with PCP. We also discussed returning to the ED immediately if new or worsening sx occur. We discussed the sx which are most concerning (e.g., sudden worsening pain, fever, inability to tolerate by mouth) that necessitate immediate return. Medications administered to the patient during their visit and any new prescriptions provided to the patient are listed below.  Medications given during this visit Medications - No data to display   DDX abdominal wall hematoma, intraabdominal hemorrhage Old records reviewed partial colectomy in 2013, diverticulitis visits and admit.   The patient appears reasonably screen and/or stabilized for discharge and I doubt any other medical condition or other [facility] requiring further screening, evaluation, or treatment in the ED at this time prior to discharge.    Final Clinical Impressions(s) / ED Diagnoses   Final diagnoses:  Abdominal wall hematoma, initial encounter    ED Discharge Orders    None       Deno Etienne, DO 09/27/17 1108

## 2017-09-27 NOTE — ED Triage Notes (Signed)
Pt was mowing on Monday, feet slipped out from under him, landed on back, did not feel like he injured himself. Noted bruising on left abd this am and is concerned

## 2017-10-05 DIAGNOSIS — H5712 Ocular pain, left eye: Secondary | ICD-10-CM | POA: Diagnosis not present

## 2017-10-09 ENCOUNTER — Telehealth: Payer: Self-pay

## 2017-10-09 ENCOUNTER — Telehealth: Payer: Self-pay | Admitting: Internal Medicine

## 2017-10-09 NOTE — Telephone Encounter (Signed)
Pt dropped off surgical clearance form from [facility].  Please fax completed form to Santiago Bur at [phone].  Form put in Dr. Gwynn Burly box for pick up & completion.  jsl

## 2017-10-09 NOTE — Telephone Encounter (Signed)
   [facility] Pre-operative Risk Assessment    Request for surgical clearance:  1. What type of surgery is being performed? Left TKA with Spinal adductor canal block   2. When is this surgery scheduled? TBD   3. What type of clearance is required (medical clearance vs. Pharmacy clearance to hold med vs. Both)? Medical  4. Are there any medications that need to be held prior to surgery and how long? NONE   5. Practice name and name of physician performing surgery? [facility] 1. Dr. Hart Robinsons   6. What is your office phone [phone]    7.   What is your office fax number? [phone] Attn: Jeronimo Norma  8.   Anesthesia type (None, local, MAC, general) ? general   Newt Minion 10/09/2017, 2:06 PM  _________________________________________________________________   (provider comments below)

## 2017-10-10 NOTE — Telephone Encounter (Signed)
Left voicemail for patient to call back and request the provider working on preoperative clearance.   If this patient calls back, please route phone call to preop APP.

## 2017-10-10 NOTE — Telephone Encounter (Signed)
   Primary Cardiologist: Kirk Ruths, MD  Chart reviewed as part of pre-operative protocol coverage. Patient was contacted 10/10/2017 in reference to pre-operative risk assessment for pending surgery as outlined below.  Harry Carroll was last seen on 05/11/18 by Dr. Stanford Breed.  Since that day, Harry Carroll has done well. He can complete greater than 4 METS  Therefore, based on ACC/AHA guidelines, the patient would be at acceptable risk for the planned procedure without further cardiovascular testing.   I will route this recommendation to the requesting party via Epic fax function and remove from pre-op pool.  Please call with questions.  [place], PA 10/10/2017, 3:08 PM

## 2017-10-10 NOTE — Telephone Encounter (Signed)
Faxed

## 2017-10-10 NOTE — Telephone Encounter (Signed)
Follow Up: ° ° ° °Returning your call from yesterday. °

## 2017-10-16 ENCOUNTER — Ambulatory Visit: Payer: Self-pay | Admitting: Orthopedic Surgery

## 2017-11-20 NOTE — H&P (Signed)
TOTAL KNEE ADMISSION H&P  Patient is being admitted for left total knee arthroplasty.  Subjective:  Chief Complaint:left knee pain.  HPI: Harry Carroll, 57 y.o. male, has a history of pain and functional disability in the left knee due to arthritis and has failed non-surgical conservative treatments for greater than 12 weeks to includecorticosteriod injections, viscosupplementation injections, supervised PT with diminished ADL's post treatment, use of assistive devices and weight reduction as appropriate.  Onset of symptoms was gradual, starting 3 years ago with gradually worsening course since that time. The patient noted prior procedures on the knee to include  arthroscopy on the bilaterally knee(s).  Patient currently rates pain in the left knee(s) at 8 out of 10 with activity. Patient has worsening of pain with activity and weight bearing, pain that interferes with activities of daily living, pain with passive range of motion and joint swelling.  Patient has evidence of subchondral sclerosis, periarticular osteophytes and joint space narrowing by imaging studies. There is no active infection.  Patient Active Problem List   Diagnosis Date Noted  . Hyperglycemia 06/22/2017  . Numbness and tingling in right hand 01/04/2017  . Low testosterone 10/03/2016  . Obesity 03/28/2016  . Bilateral wrist pain 12/25/2015  . Acute diverticulitis 10/21/2015  . Numbness and tingling of both upper extremities while sleeping 08/27/2015  . Routine general medical examination at a health care facility 03/24/2015  . Bipolar I disorder, most recent episode (or current) unspecified 02/26/2015  . Rib contusion 11/13/2014  . Ganglion cyst of wrist 06/26/2014  . History of colonic polyps 04/02/2014  . Diverticulitis of colon (without mention of hemorrhage)(562.11) 01/17/2014  . Chest pain 09/04/2013  . Abdominal pain, left lower quadrant 02/18/2013  . Dizziness 03/29/2012  . Diverticulitis-post resection July  2013 11/27/2011  . OSA (obstructive sleep apnea) 11/17/2011  . Fatigue 10/11/2011  . Personal history of colonic polyps 02/18/2011  . HYPERTRIGLYCERIDEMIA 02/20/2009  . BIPOLAR DISORDER UNSPECIFIED 11/29/2007  . INSOMNIA 11/29/2007  . LIVER FUNCTION TESTS, ABNORMAL, HX OF 11/29/2007  . DEPRESSION 07/07/2007  . Hyperlipidemia 04/16/2007   Past Medical History:  Diagnosis Date  . Arthritis   . Bipolar 1 disorder (HCC)    Dr Clovis Pu   . Carpal tunnel syndrome    saw Dr Kristie Cowman   . Colon polyps    adenomatous  . Diverticulitis   . Heart murmur   . Hyperlipemia   . Insomnia   . Normal cardiac stress test    CP 01-2009---Nl ECHO and stress test neg  . Restless leg   . Sleep apnea     Past Surgical History:  Procedure Laterality Date  . COLONOSCOPY WITH PROPOFOL N/A 04/02/2014   Procedure: COLONOSCOPY WITH PROPOFOL;  Surgeon: Inda Castle, MD;  Location: [facility];  Service: Endoscopy;  Laterality: N/A;  . CYSTOSCOPY WITH URETHRAL DILATATION  12/20/2011   Procedure: CYSTOSCOPY WITH URETHRAL DILATATION;  Surgeon: Earnstine Regal, MD;  Location: [facility];  Service: General;;  with foley insertion  . CYSTOSCOPY WITH URETHRAL DILATATION  12/20/2011   Procedure: CYSTOSCOPY WITH URETHRAL DILATATION;  Surgeon: Franchot Gallo, MD;  Location: [facility];  Service: Urology;;  . HOT HEMOSTASIS N/A 04/02/2014   Procedure: HOT HEMOSTASIS (ARGON PLASMA COAGULATION/BICAP);  Surgeon: Inda Castle, MD;  Location: [facility];  Service: Endoscopy;  Laterality: N/A;  . maxillofacial surgery    . PARTIAL COLECTOMY  12/20/2011   Procedure: PARTIAL COLECTOMY;  Surgeon: Earnstine Regal, MD;  Location: [facility];  Service:  General;  Laterality: N/A;  Sigmoid colectomy  . right knee arthroscopy     2007  . right shoulder surgery     X2  . SHOULDER SURGERY     LEFT    No current facility-administered medications for this encounter.    Current Outpatient Medications  Medication Sig Dispense Refill Last  Dose  . aspirin EC 81 MG tablet Take 81 mg daily by mouth.   Taking  . azithromycin (ZITHROMAX) 250 MG tablet 2 tabs today, then 1 tab daily x 4 more days 6 tablet 0   . rosuvastatin (CRESTOR) 20 MG tablet Take 1 tablet (20 mg total) by mouth daily. 90 tablet 3    Allergies  Allergen Reactions  . Ciprofloxacin Shortness Of Breath, Nausea And Vomiting, Nausea Only, Rash and Other (See Comments)    Marked mucous production  . Dilaudid [Hydromorphone Hcl] Shortness Of Breath and Nausea Only    Patient developed rash, excessive mucous production and chest pain.  Marland Kitchen Hydrocodone Itching    Social History   Tobacco Use  . Smoking status: Current Some Day Smoker    Types: Cigars  . Smokeless tobacco: Current User    Types: Snuff  Substance Use Topics  . Alcohol use: Yes    Comment:  a couple beers a few times a week     Family History  Problem Relation Age of Onset  . Prostate cancer Paternal Grandfather   . Cancer Paternal Aunt        lymph cancer  . Alcohol abuse Father   . Alcohol abuse Sister   . Colon cancer Neg Hx   . CAD Neg Hx   . Pancreatic cancer Neg Hx   . Rectal cancer Neg Hx   . Stomach cancer Neg Hx   . Drug abuse Neg Hx   . Anxiety disorder Neg Hx   . Bipolar disorder Neg Hx   . Depression Neg Hx      Review of Systems  Constitutional: Negative.   HENT: Negative.   Eyes: Negative.   Respiratory: Negative.   Cardiovascular: Negative.   Gastrointestinal: Negative.   Genitourinary: Negative.   Musculoskeletal: Positive for joint pain.  Skin: Negative.   Neurological: Negative.   Endo/Heme/Allergies: Negative.   Psychiatric/Behavioral: Negative.     Objective:  Physical Exam  Constitutional: He is oriented to person, place, and time. He appears well-developed.  HENT:  Head: Normocephalic.  Eyes: EOM are normal.  Neck: Normal range of motion.  Cardiovascular: Normal rate and intact distal pulses.  Respiratory: Effort normal.  GI: Soft.   Genitourinary:  Genitourinary Comments: Deferred  Musculoskeletal:  Bilateral knee pain. Limited ROM. Fair strength. Calf is soft and nontender.  Neurological: He is alert and oriented to person, place, and time.  Skin: Skin is dry.  Psychiatric: His behavior is normal.    Vital signs in last 24 hours: BP: ()/()  Arterial Line BP: ()/()   Labs:   Estimated body mass index is 36.92 kg/m as calculated from the following:   Height as of 09/27/17: 5\' 9"  (1.753 m).   Weight as of 09/27/17: 113.4 kg (250 lb).   Imaging Review Plain radiographs demonstrate moderate degenerative joint disease of the left knee(s). The overall alignment ismild varus. The bone quality appears to be good for age and reported activity level.   Preoperative templating of the joint replacement has been completed, documented, and submitted to the Operating Room personnel in order to optimize intra-operative equipment management.  Anticipated LOS equal to or greater than 2 midnights due to - Age 40 and older with one or more of the following:  - Obesity  - Expected need for hospital services (PT, OT, Nursing) required for safe  discharge  - Anticipated need for postoperative skilled nursing care or inpatient rehab  - Active co-morbidities: None OR   - Unanticipated findings during/Post Surgery: None       Assessment/Plan:  End stage arthritis, left knee   The patient history, physical examination, clinical judgment of the provider and imaging studies are consistent with end stage degenerative joint disease of the left knee(s) and total knee arthroplasty is deemed medically necessary. The treatment options including medical management, injection therapy arthroscopy and arthroplasty were discussed at length. The risks and benefits of total knee arthroplasty were presented and reviewed. The risks due to aseptic loosening, infection, stiffness, patella tracking problems, thromboembolic complications and  other imponderables were discussed. The patient acknowledged the explanation, agreed to proceed with the plan and consent was signed. Patient is being admitted for inpatient treatment for surgery, pain control, PT, OT, prophylactic antibiotics, VTE prophylaxis, progressive ambulation and ADL's and discharge planning. The patient is planning to be discharged home with home health services   Will use IV tranexamic acid. Contraindications and adverse affects of Tranexamic acid discussed in detail. Patient denies any of these at this time and understands the risks and benefits.

## 2017-12-05 ENCOUNTER — Encounter (HOSPITAL_COMMUNITY): Payer: Self-pay

## 2017-12-05 NOTE — Pre-Procedure Instructions (Signed)
The following are in epic: Cardiac Clearance dr. Stanford Breed 10/09/2017 (also on chart) Last office visit note Dr. Jenny Reichmann 06/22/2017 Last office visit note Dr. Stanford Breed 04/24/2017 EKG 05/09/2017 CXR 04/17/2017 Stress test 05/31/2017  Surgical clearance from Dr. Jenny Reichmann in chart

## 2017-12-05 NOTE — Patient Instructions (Addendum)
Your procedure is scheduled on: Friday, December 15, 2017   Surgery Time:  1:15PM-3:15PM   Report to [facility]  Entrance    Report to admitting at 10:45 AM   Call this number if you have problems the morning of surgery [phone]   Cpap mask an tubing day of surgery   Do not eat food:After Midnight.   Do NOT smoke after Midnight   May have liquids until 7:00AM day of surgery      CLEAR LIQUID DIET   Foods Allowed                                                                     Foods Excluded  Coffee and tea, regular and decaf                             liquids that you cannot  Plain Jell-O in any flavor                                             see through such as: Fruit ices (not with fruit pulp)                                     milk, soups, orange juice  Iced Popsicles                                    All solid food Carbonated beverages, regular and diet                                    Cranberry, grape and apple juices Sports drinks like Gatorade Lightly seasoned clear broth or consume(fat free) Sugar, honey syrup  Sample Menu Breakfast                                Lunch                                     Supper Cranberry juice                    Beef broth                            Chicken broth Jell-O                                     Grape juice                           Apple juice Coffee or tea  Jell-O                                      Popsicle                                                Coffee or tea                        Coffee or tea   Take these medicines the morning of surgery with A SIP OF WATER: None                               You may not have any metal on your body including jewelry, and body piercings             Do not wear lotions, powders, perfumes/cologne, or deodorant                          Men may shave face and neck.   Do not bring valuables to the hospital. [place].   Contacts, dentures or bridgework may not be worn into surgery.   Leave suitcase in the car. After surgery it may be brought to your room.   Special Instructions: Bring a copy of your healthcare power of attorney and living will documents         the day of surgery if you haven't scanned them in before.              Please read over the following fact sheets you were given:  [facility] - Preparing for Surgery Before surgery, you can play an important role.  Because skin is not sterile, your skin needs to be as free of germs as possible.  You can reduce the number of germs on your skin by washing with CHG (chlorahexidine gluconate) soap before surgery.  CHG is an antiseptic cleaner which kills germs and bonds with the skin to continue killing germs even after washing. Please DO NOT use if you have an allergy to CHG or antibacterial soaps.  If your skin becomes reddened/irritated stop using the CHG and inform your nurse when you arrive at Short Stay. Do not shave (including legs and underarms) for at least 48 hours prior to the first CHG shower.  You may shave your face/neck.  Please follow these instructions carefully:  1.  Shower with CHG Soap the night before surgery and the  morning of surgery.  2.  If you choose to wash your hair, wash your hair first as usual with your normal  shampoo.  3.  After you shampoo, rinse your hair and body thoroughly to remove the shampoo.                             4.  Use CHG as you would any other liquid soap.  You can apply chg directly to the skin and wash.  Gently with a scrungie or clean washcloth.  5.  Apply the CHG Soap to  your body ONLY FROM THE NECK DOWN.   Do   not use on face/ open                           Wound or open sores. Avoid contact with eyes, ears mouth and   genitals (private parts).                       Wash face,  Genitals (private parts) with your normal soap.             6.  Wash  thoroughly, paying special attention to the area where your    surgery  will be performed.  7.  Thoroughly rinse your body with warm water from the neck down.  8.  DO NOT shower/wash with your normal soap after using and rinsing off the CHG Soap.                9.  Pat yourself dry with a clean towel.            10.  Wear clean pajamas.            11.  Place clean sheets on your bed the night of your first shower and do not  sleep with pets. Day of Surgery : Do not apply any lotions/deodorants the morning of surgery.  Please wear clean clothes to the hospital/surgery center.  FAILURE TO FOLLOW THESE INSTRUCTIONS MAY RESULT IN THE CANCELLATION OF YOUR SURGERY  PATIENT SIGNATURE_________________________________  NURSE SIGNATURE__________________________________  ________________________________________________________________________   [name]  An incentive spirometer is a tool that can help keep your lungs clear and active. This tool measures how well you are filling your lungs with each breath. Taking long deep breaths may help reverse or decrease the chance of developing breathing (pulmonary) problems (especially infection) following:  A long period of time when you are unable to move or be active. BEFORE THE PROCEDURE   If the spirometer includes an indicator to show your best effort, your nurse or respiratory therapist will set it to a desired goal.  If possible, sit up straight or lean slightly forward. Try not to slouch.  Hold the incentive spirometer in an upright position. INSTRUCTIONS FOR USE  1. Sit on the edge of your bed if possible, or sit up as far as you can in bed or on a chair. 2. Hold the incentive spirometer in an upright position. 3. Breathe out normally. 4. Place the mouthpiece in your mouth and seal your lips tightly around it. 5. Breathe in slowly and as deeply as possible, raising the piston or the ball toward the top of the column. 6. Hold your  breath for 3-5 seconds or for as long as possible. Allow the piston or ball to fall to the bottom of the column. 7. Remove the mouthpiece from your mouth and breathe out normally. 8. Rest for a few seconds and repeat Steps 1 through 7 at least 10 times every 1-2 hours when you are awake. Take your time and take a few normal breaths between deep breaths. 9. The spirometer may include an indicator to show your best effort. Use the indicator as a goal to work toward during each repetition. 10. After each set of 10 deep breaths, practice coughing to be sure your lungs are clear. If you have an incision (the cut made at the time of surgery), support your incision when coughing by placing  a pillow or rolled up towels firmly against it. Once you are able to get out of bed, walk around indoors and cough well. You may stop using the incentive spirometer when instructed by your caregiver.  RISKS AND COMPLICATIONS  Take your time so you do not get dizzy or light-headed.  If you are in pain, you may need to take or ask for pain medication before doing incentive spirometry. It is harder to take a deep breath if you are having pain. AFTER USE  Rest and breathe slowly and easily.  It can be helpful to keep track of a log of your progress. Your caregiver can provide you with a simple table to help with this. If you are using the spirometer at home, follow these instructions: [place] IF:   You are having difficultly using the spirometer.  You have trouble using the spirometer as often as instructed.  Your pain medication is not giving enough relief while using the spirometer.  You develop fever of 100.5 F (38.1 C) or higher. SEEK IMMEDIATE MEDICAL CARE IF:   You cough up bloody sputum that had not been present before.  You develop fever of 102 F (38.9 C) or greater.  You develop worsening pain at or near the incision site. MAKE SURE YOU:   Understand these instructions.  Will watch  your condition.  Will get help right away if you are not doing well or get worse. Document Released: 10/10/2006 Document Revised: 08/22/2011 Document Reviewed: 12/11/2006 Exit[employer] Patient Information 2014 [employer], [place].   ________________________________________________________________________  WHAT IS A BLOOD TRANSFUSION? Blood Transfusion Information  A transfusion is the replacement of blood or some of its parts. Blood is made up of multiple cells which provide different functions.  Red blood cells carry oxygen and are used for blood loss replacement.  White blood cells fight against infection.  Platelets control bleeding.  Plasma helps clot blood.  Other blood products are available for specialized needs, such as hemophilia or other clotting disorders. BEFORE THE TRANSFUSION  Who gives blood for transfusions?   Healthy volunteers who are fully evaluated to make sure their blood is safe. This is blood bank blood. Transfusion therapy is the safest it has ever been in the practice of medicine. Before blood is taken from a donor, a complete history is taken to make sure that person has no history of diseases nor engages in risky social behavior (examples are intravenous drug use or sexual activity with multiple partners). The donor's travel history is screened to minimize risk of transmitting infections, such as malaria. The donated blood is tested for signs of infectious diseases, such as HIV and hepatitis. The blood is then tested to be sure it is compatible with you in order to minimize the chance of a transfusion reaction. If you or a relative donates blood, this is often done in anticipation of surgery and is not appropriate for emergency situations. It takes many days to process the donated blood. RISKS AND COMPLICATIONS Although transfusion therapy is very safe and saves many lives, the main dangers of transfusion include:   Getting an infectious disease.  Developing a  transfusion reaction. This is an allergic reaction to something in the blood you were given. Every precaution is taken to prevent this. The decision to have a blood transfusion has been considered carefully by your caregiver before blood is given. Blood is not given unless the benefits outweigh the risks. AFTER THE TRANSFUSION  Right after receiving a blood transfusion,  you will usually feel much better and more energetic. This is especially true if your red blood cells have gotten low (anemic). The transfusion raises the level of the red blood cells which carry oxygen, and this usually causes an energy increase.  The nurse administering the transfusion will monitor you carefully for complications. HOME CARE INSTRUCTIONS  No special instructions are needed after a transfusion. You may find your energy is better. Speak with your caregiver about any limitations on activity for underlying diseases you may have. SEEK MEDICAL CARE IF:   Your condition is not improving after your transfusion.  You develop redness or irritation at the intravenous (IV) site. SEEK IMMEDIATE MEDICAL CARE IF:  Any of the following symptoms occur over the next 12 hours:  Shaking chills.  You have a temperature by mouth above 102 F (38.9 C), not controlled by medicine.  Chest, back, or muscle pain.  People around you feel you are not acting correctly or are confused.  Shortness of breath or difficulty breathing.  Dizziness and fainting.  You get a rash or develop hives.  You have a decrease in urine output.  Your urine turns a dark color or changes to pink, red, or brown. Any of the following symptoms occur over the next 10 days:  You have a temperature by mouth above 102 F (38.9 C), not controlled by medicine.  Shortness of breath.  Weakness after normal activity.  The white part of the eye turns yellow (jaundice).  You have a decrease in the amount of urine or are urinating less often.  Your  urine turns a dark color or changes to pink, red, or brown. Document Released: 05/27/2000 Document Revised: 08/22/2011 Document Reviewed: 01/14/2008 [facility] Patient Information 2014 [place], [place].  _______________________________________________________________________

## 2017-12-06 ENCOUNTER — Encounter (HOSPITAL_COMMUNITY): Payer: Self-pay

## 2017-12-06 ENCOUNTER — Other Ambulatory Visit: Payer: Self-pay

## 2017-12-06 ENCOUNTER — Encounter (HOSPITAL_COMMUNITY)
Admission: RE | Admit: 2017-12-06 | Discharge: 2017-12-06 | Disposition: A | Discharge status: Home / Self Care | Payer: 59 | Source: Ambulatory Visit | Attending: Specialist | Admitting: Specialist

## 2017-12-06 DIAGNOSIS — M1712 Unilateral primary osteoarthritis, left knee: Secondary | ICD-10-CM | POA: Insufficient documentation

## 2017-12-06 DIAGNOSIS — Z01812 Encounter for preprocedural laboratory examination: Secondary | ICD-10-CM | POA: Insufficient documentation

## 2017-12-06 HISTORY — DX: Occlusion and stenosis of unspecified carotid artery: I65.29

## 2017-12-06 HISTORY — DX: Diverticulosis of intestine, part unspecified, without perforation or abscess without bleeding: K57.90

## 2017-12-06 HISTORY — DX: Personal history of other diseases of the digestive system: Z87.19

## 2017-12-06 HISTORY — DX: Spinal stenosis, cervical region: M48.02

## 2017-12-06 HISTORY — DX: Ventricular premature depolarization: I49.3

## 2017-12-06 LAB — BASIC METABOLIC PANEL
Anion gap: 9 (ref 5–15)
BUN: 20 mg/dL (ref 6–20)
CO2: 24 mmol/L (ref 22–32)
CREATININE: 1.05 mg/dL (ref 0.61–1.24)
Calcium: 9.5 mg/dL (ref 8.9–10.3)
Chloride: 105 mmol/L (ref 98–111)
GFR calc Af Amer: >60 mL/min (ref >60)
GFR calc non Af Amer: >60 mL/min (ref >60)
GLUCOSE: 87 mg/dL (ref 70–99)
Potassium: 4.2 mmol/L (ref 3.5–5.1)
SODIUM: 138 mmol/L (ref 135–145)

## 2017-12-06 LAB — URINALYSIS, ROUTINE W REFLEX MICROSCOPIC
Bilirubin Urine: NEGATIVE
Glucose, UA: NEGATIVE mg/dL
Hgb urine dipstick: NEGATIVE
Ketones, ur: NEGATIVE mg/dL
LEUKOCYTES UA: NEGATIVE
NITRITE: NEGATIVE
PH: 5 (ref 5.0–8.0)
Protein, ur: NEGATIVE mg/dL
SPECIFIC GRAVITY, URINE: 1.02 (ref 1.005–1.030)

## 2017-12-06 LAB — CBC
HCT: 45.2 % (ref 39.0–52.0)
Hemoglobin: 16.4 g/dL (ref 13.0–17.0)
MCH: 33.1 pg (ref 26.0–34.0)
MCHC: 36.3 g/dL — AB (ref 30.0–36.0)
MCV: 91.3 fL (ref 78.0–100.0)
PLATELETS: 181 10*3/uL (ref 150–400)
RBC: 4.95 MIL/uL (ref 4.22–5.81)
RDW: 13 % (ref 11.5–15.5)
WBC: 6.4 10*3/uL (ref 4.0–10.5)

## 2017-12-06 LAB — APTT: APTT: 27 s (ref 24–36)

## 2017-12-06 LAB — PROTIME-INR
INR: 1.17
PROTHROMBIN TIME: 14.8 s (ref 11.4–15.2)

## 2017-12-06 LAB — SURGICAL PCR SCREEN
MRSA, PCR: NEGATIVE
Staphylococcus aureus: NEGATIVE

## 2017-12-14 MED ORDER — TRANEXAMIC ACID 1000 MG/10ML IV SOLN
1000.0000 mg | INTRAVENOUS | Status: AC
  Administered 2017-12-15: 1000 mg via INTRAVENOUS
  Filled 2017-12-14: qty 1100

## 2017-12-15 ENCOUNTER — Encounter (HOSPITAL_COMMUNITY): Payer: Self-pay | Admitting: Emergency Medicine

## 2017-12-15 ENCOUNTER — Inpatient Hospital Stay (HOSPITAL_COMMUNITY)
Admission: RE | Admit: 2017-12-15 | Discharge: 2017-12-18 | DRG: 470 | Disposition: A | Discharge status: Home Health | Payer: 59 | Source: Ambulatory Visit | Attending: Specialist | Admitting: Specialist

## 2017-12-15 ENCOUNTER — Encounter (HOSPITAL_COMMUNITY)
Admission: RE | Disposition: A | Discharge status: Home Health | Payer: Self-pay | Source: Ambulatory Visit | Attending: Specialist

## 2017-12-15 ENCOUNTER — Inpatient Hospital Stay (HOSPITAL_COMMUNITY): Payer: 59 | Admitting: Anesthesiology

## 2017-12-15 ENCOUNTER — Other Ambulatory Visit: Payer: Self-pay

## 2017-12-15 DIAGNOSIS — E781 Pure hyperglyceridemia: Secondary | ICD-10-CM | POA: Diagnosis present

## 2017-12-15 DIAGNOSIS — G8918 Other acute postprocedural pain: Secondary | ICD-10-CM | POA: Diagnosis not present

## 2017-12-15 DIAGNOSIS — Z7982 Long term (current) use of aspirin: Secondary | ICD-10-CM

## 2017-12-15 DIAGNOSIS — Z96651 Presence of right artificial knee joint: Secondary | ICD-10-CM

## 2017-12-15 DIAGNOSIS — Z79899 Other long term (current) drug therapy: Secondary | ICD-10-CM

## 2017-12-15 DIAGNOSIS — Z881 Allergy status to other antibiotic agents status: Secondary | ICD-10-CM

## 2017-12-15 DIAGNOSIS — Z96659 Presence of unspecified artificial knee joint: Secondary | ICD-10-CM

## 2017-12-15 DIAGNOSIS — Z6838 Body mass index (BMI) 38.0-38.9, adult: Secondary | ICD-10-CM | POA: Diagnosis not present

## 2017-12-15 DIAGNOSIS — N35919 Unspecified urethral stricture, male, unspecified site: Secondary | ICD-10-CM | POA: Diagnosis present

## 2017-12-15 DIAGNOSIS — G2581 Restless legs syndrome: Secondary | ICD-10-CM | POA: Diagnosis present

## 2017-12-15 DIAGNOSIS — F17290 Nicotine dependence, other tobacco product, uncomplicated: Secondary | ICD-10-CM | POA: Diagnosis present

## 2017-12-15 DIAGNOSIS — Z885 Allergy status to narcotic agent status: Secondary | ICD-10-CM

## 2017-12-15 DIAGNOSIS — G4733 Obstructive sleep apnea (adult) (pediatric): Secondary | ICD-10-CM | POA: Diagnosis not present

## 2017-12-15 DIAGNOSIS — N99110 Postprocedural urethral stricture, male, meatal: Secondary | ICD-10-CM | POA: Diagnosis not present

## 2017-12-15 DIAGNOSIS — M1712 Unilateral primary osteoarthritis, left knee: Principal | ICD-10-CM | POA: Diagnosis present

## 2017-12-15 DIAGNOSIS — E785 Hyperlipidemia, unspecified: Secondary | ICD-10-CM | POA: Diagnosis present

## 2017-12-15 HISTORY — PX: TOTAL KNEE ARTHROPLASTY: SHX125

## 2017-12-15 HISTORY — PX: CYSTOSCOPY WITH URETHRAL DILATATION: SHX5125

## 2017-12-15 LAB — TYPE AND SCREEN
ABO/RH(D): A POS
Antibody Screen: NEGATIVE

## 2017-12-15 SURGERY — ARTHROPLASTY, KNEE, TOTAL
Anesthesia: Spinal | Site: Penis

## 2017-12-15 MED ORDER — MENTHOL 3 MG MT LOZG: 1.0000 | LOZENGE | OROMUCOSAL | Status: DC | PRN

## 2017-12-15 MED ORDER — LACTATED RINGERS IV SOLN
INTRAVENOUS | Status: DC
  Administered 2017-12-15 (×3): via INTRAVENOUS

## 2017-12-15 MED ORDER — ROPIVACAINE HCL 7.5 MG/ML IJ SOLN
INTRAMUSCULAR | Status: DC | PRN
  Administered 2017-12-15: 20 mL via PERINEURAL

## 2017-12-15 MED ORDER — DEXTROSE 5 % IV SOLN
500.0000 mg | Freq: Four times a day (QID) | INTRAVENOUS | Status: DC | PRN
  Administered 2017-12-15: 500 mg via INTRAVENOUS
  Filled 2017-12-15: qty 5
  Filled 2017-12-15: qty 550

## 2017-12-15 MED ORDER — ONDANSETRON HCL 4 MG/2ML IJ SOLN
4.0000 mg | Freq: Four times a day (QID) | INTRAMUSCULAR | Status: DC | PRN
  Filled 2017-12-15: qty 2

## 2017-12-15 MED ORDER — METOCLOPRAMIDE HCL 5 MG/ML IJ SOLN
5.0000 mg | Freq: Three times a day (TID) | INTRAMUSCULAR | Status: DC | PRN
  Administered 2017-12-16 – 2017-12-17 (×2): 10 mg via INTRAVENOUS
  Filled 2017-12-15 (×2): qty 2

## 2017-12-15 MED ORDER — MAGNESIUM CITRATE PO SOLN: 1.0000 | Freq: Once | ORAL | Status: DC | PRN

## 2017-12-15 MED ORDER — DOCUSATE SODIUM 100 MG PO CAPS
100.0000 mg | ORAL_CAPSULE | Freq: Two times a day (BID) | ORAL | Status: DC
  Administered 2017-12-15 – 2017-12-18 (×4): 100 mg via ORAL
  Filled 2017-12-15 (×4): qty 1

## 2017-12-15 MED ORDER — FENTANYL CITRATE (PF) 100 MCG/2ML IJ SOLN
INTRAMUSCULAR | Status: DC | PRN
  Administered 2017-12-15: 100 ug via INTRAVENOUS

## 2017-12-15 MED ORDER — ONDANSETRON HCL 4 MG PO TABS
4.0000 mg | ORAL_TABLET | Freq: Four times a day (QID) | ORAL | Status: DC | PRN
  Administered 2017-12-16: 4 mg via ORAL
  Filled 2017-12-15: qty 1

## 2017-12-15 MED ORDER — BUPIVACAINE-EPINEPHRINE 0.25% -1:200000 IJ SOLN
INTRAMUSCULAR | Status: DC | PRN
  Administered 2017-12-15: 30 mL

## 2017-12-15 MED ORDER — PROPOFOL 10 MG/ML IV BOLUS
INTRAVENOUS | Status: AC
  Filled 2017-12-15: qty 20

## 2017-12-15 MED ORDER — DEXTROSE 5 % IV SOLN
INTRAVENOUS | Status: DC | PRN
  Administered 2017-12-15: 30 ug/min via INTRAVENOUS

## 2017-12-15 MED ORDER — STERILE WATER FOR IRRIGATION IR SOLN
Status: DC | PRN
  Administered 2017-12-15: 2000 mL

## 2017-12-15 MED ORDER — POLYETHYLENE GLYCOL 3350 17 G PO PACK
17.0000 g | PACK | Freq: Every day | ORAL | Status: DC | PRN
  Administered 2017-12-18: 17 g via ORAL

## 2017-12-15 MED ORDER — DIPHENHYDRAMINE HCL 12.5 MG/5ML PO ELIX: 12.5000 mg | ORAL_SOLUTION | ORAL | Status: DC | PRN

## 2017-12-15 MED ORDER — MORPHINE SULFATE (PF) 4 MG/ML IV SOLN
2.0000 mg | INTRAVENOUS | Status: DC | PRN
  Administered 2017-12-16 – 2017-12-17 (×4): 2 mg via INTRAVENOUS
  Filled 2017-12-15 (×4): qty 1

## 2017-12-15 MED ORDER — PROPOFOL 10 MG/ML IV BOLUS
INTRAVENOUS | Status: AC
  Filled 2017-12-15: qty 60

## 2017-12-15 MED ORDER — MORPHINE SULFATE (PF) 4 MG/ML IV SOLN
INTRAVENOUS | Status: AC
  Filled 2017-12-15: qty 1

## 2017-12-15 MED ORDER — DEXAMETHASONE SODIUM PHOSPHATE 10 MG/ML IJ SOLN: 10.0000 mg | Freq: Once | INTRAMUSCULAR | Status: DC

## 2017-12-15 MED ORDER — MORPHINE SULFATE (PF) 4 MG/ML IV SOLN
1.0000 mg | INTRAVENOUS | Status: DC | PRN
  Administered 2017-12-15 (×6): 2 mg via INTRAVENOUS

## 2017-12-15 MED ORDER — CEFAZOLIN SODIUM-DEXTROSE 2-4 GM/100ML-% IV SOLN
2.0000 g | Freq: Four times a day (QID) | INTRAVENOUS | Status: AC
  Administered 2017-12-15 – 2017-12-16 (×2): 2 g via INTRAVENOUS
  Filled 2017-12-15 (×2): qty 100

## 2017-12-15 MED ORDER — DEXAMETHASONE SODIUM PHOSPHATE 10 MG/ML IJ SOLN
10.0000 mg | Freq: Once | INTRAMUSCULAR | Status: AC
  Administered 2017-12-15: 10 mg via INTRAVENOUS

## 2017-12-15 MED ORDER — BISACODYL 5 MG PO TBEC
5.0000 mg | DELAYED_RELEASE_TABLET | Freq: Every day | ORAL | Status: DC | PRN
  Administered 2017-12-16: 5 mg via ORAL
  Filled 2017-12-15: qty 1

## 2017-12-15 MED ORDER — CEFAZOLIN SODIUM-DEXTROSE 2-4 GM/100ML-% IV SOLN
2.0000 g | INTRAVENOUS | Status: AC
  Administered 2017-12-15: 2 g via INTRAVENOUS
  Filled 2017-12-15: qty 100

## 2017-12-15 MED ORDER — ENOXAPARIN SODIUM 30 MG/0.3ML ~~LOC~~ SOLN
30.0000 mg | Freq: Two times a day (BID) | SUBCUTANEOUS | Status: DC
  Administered 2017-12-16 – 2017-12-18 (×5): 30 mg via SUBCUTANEOUS
  Filled 2017-12-15 (×5): qty 0.3

## 2017-12-15 MED ORDER — METHOCARBAMOL 500 MG PO TABS: 500.0000 mg | ORAL_TABLET | Freq: Three times a day (TID) | ORAL | 2 refills | Status: DC | PRN

## 2017-12-15 MED ORDER — ASPIRIN EC 325 MG PO TBEC: 325.0000 mg | DELAYED_RELEASE_TABLET | Freq: Two times a day (BID) | ORAL | 0 refills | Status: DC

## 2017-12-15 MED ORDER — KETOROLAC TROMETHAMINE 30 MG/ML IJ SOLN
INTRAMUSCULAR | Status: DC | PRN
  Administered 2017-12-15: 30 mg

## 2017-12-15 MED ORDER — MORPHINE SULFATE (PF) 4 MG/ML IV SOLN
INTRAVENOUS | Status: AC
  Filled 2017-12-15: qty 2

## 2017-12-15 MED ORDER — PHENOL 1.4 % MT LIQD
1.0000 | OROMUCOSAL | Status: DC | PRN
  Filled 2017-12-15: qty 177

## 2017-12-15 MED ORDER — BUPIVACAINE HCL (PF) 0.75 % IJ SOLN
INTRAMUSCULAR | Status: DC | PRN
  Administered 2017-12-15: 2 mL via INTRATHECAL

## 2017-12-15 MED ORDER — PHENYLEPHRINE HCL 10 MG/ML IJ SOLN
INTRAMUSCULAR | Status: DC | PRN
  Administered 2017-12-15 (×3): 80 ug via INTRAVENOUS

## 2017-12-15 MED ORDER — FENTANYL CITRATE (PF) 100 MCG/2ML IJ SOLN
INTRAMUSCULAR | Status: AC
  Filled 2017-12-15: qty 2

## 2017-12-15 MED ORDER — KETOROLAC TROMETHAMINE 30 MG/ML IJ SOLN
INTRAMUSCULAR | Status: AC
  Filled 2017-12-15: qty 1

## 2017-12-15 MED ORDER — OXYCODONE HCL 5 MG PO TABS
5.0000 mg | ORAL_TABLET | ORAL | Status: DC | PRN
  Administered 2017-12-15 – 2017-12-18 (×3): 10 mg via ORAL
  Filled 2017-12-15 (×2): qty 1
  Filled 2017-12-15 (×2): qty 2

## 2017-12-15 MED ORDER — FENTANYL CITRATE (PF) 100 MCG/2ML IJ SOLN
100.0000 ug | Freq: Once | INTRAMUSCULAR | Status: AC
Start: 2017-12-15 — End: 2017-12-15
  Administered 2017-12-15: 50 ug via INTRAVENOUS
  Filled 2017-12-15: qty 2

## 2017-12-15 MED ORDER — ALUM & MAG HYDROXIDE-SIMETH 200-200-20 MG/5ML PO SUSP
30.0000 mL | ORAL | Status: DC | PRN
  Administered 2017-12-16 – 2017-12-17 (×2): 30 mL via ORAL
  Filled 2017-12-15 (×2): qty 30

## 2017-12-15 MED ORDER — SODIUM CHLORIDE 0.9 % IJ SOLN
INTRAMUSCULAR | Status: AC
  Filled 2017-12-15: qty 50

## 2017-12-15 MED ORDER — ACETAMINOPHEN 500 MG PO TABS
1000.0000 mg | ORAL_TABLET | Freq: Four times a day (QID) | ORAL | Status: AC
  Administered 2017-12-15 – 2017-12-16 (×4): 1000 mg via ORAL
  Filled 2017-12-15 (×4): qty 2

## 2017-12-15 MED ORDER — CLONIDINE HCL (ANALGESIA) 100 MCG/ML EP SOLN
EPIDURAL | Status: DC | PRN
  Administered 2017-12-15: 50 ug

## 2017-12-15 MED ORDER — FERROUS SULFATE 325 (65 FE) MG PO TABS
325.0000 mg | ORAL_TABLET | Freq: Three times a day (TID) | ORAL | Status: DC
  Administered 2017-12-16 (×2): 325 mg via ORAL
  Filled 2017-12-15 (×5): qty 1

## 2017-12-15 MED ORDER — PROPOFOL 10 MG/ML IV BOLUS
INTRAVENOUS | Status: AC
  Filled 2017-12-15: qty 40

## 2017-12-15 MED ORDER — SODIUM CHLORIDE 0.9 % IV SOLN
INTRAVENOUS | Status: DC
  Administered 2017-12-15 – 2017-12-17 (×3): via INTRAVENOUS

## 2017-12-15 MED ORDER — OXYCODONE HCL 5 MG PO TABS
10.0000 mg | ORAL_TABLET | ORAL | Status: DC | PRN
  Administered 2017-12-16 (×3): 10 mg via ORAL
  Filled 2017-12-15 (×3): qty 2

## 2017-12-15 MED ORDER — LIDOCAINE HCL URETHRAL/MUCOSAL 2 % EX GEL
CUTANEOUS | Status: AC
  Filled 2017-12-15: qty 5

## 2017-12-15 MED ORDER — PROMETHAZINE HCL 25 MG/ML IJ SOLN: 6.2500 mg | INTRAMUSCULAR | Status: DC | PRN

## 2017-12-15 MED ORDER — MIDAZOLAM HCL 2 MG/2ML IJ SOLN
2.0000 mg | Freq: Once | INTRAMUSCULAR | Status: AC
  Administered 2017-12-15: 2 mg via INTRAVENOUS
  Filled 2017-12-15: qty 2

## 2017-12-15 MED ORDER — CHLORHEXIDINE GLUCONATE 4 % EX LIQD: 60.0000 mL | Freq: Once | CUTANEOUS | Status: DC

## 2017-12-15 MED ORDER — PROPOFOL 500 MG/50ML IV EMUL
INTRAVENOUS | Status: DC | PRN
  Administered 2017-12-15: 75 ug/kg/min via INTRAVENOUS

## 2017-12-15 MED ORDER — METOCLOPRAMIDE HCL 5 MG PO TABS: 5.0000 mg | ORAL_TABLET | Freq: Three times a day (TID) | ORAL | Status: DC | PRN

## 2017-12-15 MED ORDER — METHOCARBAMOL 500 MG PO TABS
500.0000 mg | ORAL_TABLET | Freq: Four times a day (QID) | ORAL | Status: DC | PRN
Start: 2017-12-15 — End: 2017-12-18
  Administered 2017-12-16 – 2017-12-18 (×2): 500 mg via ORAL
  Filled 2017-12-15 (×2): qty 1

## 2017-12-15 MED ORDER — SODIUM CHLORIDE 0.9 % IR SOLN
Status: DC | PRN
  Administered 2017-12-15: 2000 mL

## 2017-12-15 MED ORDER — ACETAMINOPHEN 325 MG PO TABS: 325.0000 mg | ORAL_TABLET | Freq: Four times a day (QID) | ORAL | Status: DC | PRN

## 2017-12-15 MED ORDER — LIDOCAINE HCL URETHRAL/MUCOSAL 2 % EX GEL
CUTANEOUS | Status: DC | PRN
  Administered 2017-12-15: 1 via URETHRAL

## 2017-12-15 MED ORDER — 0.9 % SODIUM CHLORIDE (POUR BTL) OPTIME
TOPICAL | Status: DC | PRN
  Administered 2017-12-15: 1000 mL

## 2017-12-15 MED ORDER — OXYCODONE HCL 5 MG PO TABS: 5.0000 mg | ORAL_TABLET | ORAL | 0 refills | Status: AC | PRN

## 2017-12-15 MED ORDER — ROSUVASTATIN CALCIUM 20 MG PO TABS
20.0000 mg | ORAL_TABLET | Freq: Every evening | ORAL | Status: DC
  Administered 2017-12-15 – 2017-12-17 (×3): 20 mg via ORAL
  Filled 2017-12-15 (×4): qty 1

## 2017-12-15 MED ORDER — BUPIVACAINE-EPINEPHRINE (PF) 0.25% -1:200000 IJ SOLN
INTRAMUSCULAR | Status: AC
  Filled 2017-12-15: qty 30

## 2017-12-15 MED ORDER — SODIUM CHLORIDE 0.9 % IJ SOLN
INTRAMUSCULAR | Status: DC | PRN
  Administered 2017-12-15: 29 mL

## 2017-12-15 MED ORDER — ONDANSETRON HCL 4 MG/2ML IJ SOLN
INTRAMUSCULAR | Status: DC | PRN
  Administered 2017-12-15: 4 mg via INTRAVENOUS

## 2017-12-15 SURGICAL SUPPLY — 72 items
ADH SKN CLS APL DERMABOND .7 (GAUZE/BANDAGES/DRESSINGS) ×2
BAG DECANTER FOR FLEXI CONT (MISCELLANEOUS) IMPLANT
BAG SPEC THK2 15X12 ZIP CLS (MISCELLANEOUS) ×2
BAG ZIPLOCK 12X15 (MISCELLANEOUS) ×8 IMPLANT
BANDAGE ACE 4X5 VEL STRL LF (GAUZE/BANDAGES/DRESSINGS) ×4 IMPLANT
BANDAGE ACE 6X5 VEL STRL LF (GAUZE/BANDAGES/DRESSINGS) ×4 IMPLANT
BLADE SAG 18X100X1.27 (BLADE) ×4 IMPLANT
BLADE SAW SGTL 13.0X1.19X90.0M (BLADE) ×4 IMPLANT
BOWL SMART MIX CTS (DISPOSABLE) ×4 IMPLANT
CAP KNEE TOTAL 3 SIGMA ×4 IMPLANT
CATH COUDE 5CC RIBBED (CATHETERS) IMPLANT
CATH FOLEY 2W COUNCIL 5CC 18FR (CATHETERS) ×4 IMPLANT
CATH FOLEY 2WAY 5CC 16FR (CATHETERS)
CATH RIBBED COUDE 5CC (CATHETERS)
CATH URET 5FR 28IN OPEN ENDED (CATHETERS) ×4 IMPLANT
CATH URTH STD 16FR FL 2W DRN (CATHETERS) IMPLANT
CEMENT HV SMART SET (Cement) ×8 IMPLANT
COVER SURGICAL LIGHT HANDLE (MISCELLANEOUS) ×4 IMPLANT
CUFF TOURN SGL QUICK 34 (TOURNIQUET CUFF) ×3
CUFF TRNQT CYL 34X4X40X1 (TOURNIQUET CUFF) ×2 IMPLANT
DECANTER SPIKE VIAL GLASS SM (MISCELLANEOUS) ×4 IMPLANT
DERMABOND ADVANCED (GAUZE/BANDAGES/DRESSINGS) ×2
DERMABOND ADVANCED .7 DNX12 (GAUZE/BANDAGES/DRESSINGS) ×2 IMPLANT
DRAPE LG THREE QUARTER DISP (DRAPES) ×4 IMPLANT
DRAPE U-SHAPE 47X51 STRL (DRAPES) ×8 IMPLANT
DRSG AQUACEL AG ADV 3.5X10 (GAUZE/BANDAGES/DRESSINGS) ×4 IMPLANT
DRSG TEGADERM 4X4.75 (GAUZE/BANDAGES/DRESSINGS) ×4 IMPLANT
DURAPREP 26ML APPLICATOR (WOUND CARE) ×8 IMPLANT
ELECT REM PT RETURN 15FT ADLT (MISCELLANEOUS) ×4 IMPLANT
EVACUATOR 1/8 PVC DRAIN (DRAIN) ×4 IMPLANT
GAUZE SPONGE 2X2 8PLY STRL LF (GAUZE/BANDAGES/DRESSINGS) ×2 IMPLANT
GLOVE BIO SURGEON STRL SZ7.5 (GLOVE) ×8 IMPLANT
GLOVE BIOGEL M 8.0 STRL (GLOVE) ×4 IMPLANT
GLOVE BIOGEL PI IND STRL 7.0 (GLOVE) ×6 IMPLANT
GLOVE BIOGEL PI IND STRL 7.5 (GLOVE) ×8 IMPLANT
GLOVE BIOGEL PI IND STRL 8 (GLOVE) ×4 IMPLANT
GLOVE BIOGEL PI INDICATOR 7.0 (GLOVE) ×6
GLOVE BIOGEL PI INDICATOR 7.5 (GLOVE) ×8
GLOVE BIOGEL PI INDICATOR 8 (GLOVE) ×4
GLOVE ECLIPSE 8.0 STRL XLNG CF (GLOVE) ×8 IMPLANT
GLOVE SURG ORTHO 9.0 STRL STRW (GLOVE) ×4 IMPLANT
GLOVE SURG SS PI 7.0 STRL IVOR (GLOVE) ×4 IMPLANT
GOWN STRL REUS W/ TWL XL LVL3 (GOWN DISPOSABLE) ×6 IMPLANT
GOWN STRL REUS W/TWL XL LVL3 (GOWN DISPOSABLE) ×20 IMPLANT
GUIDEWIRE STR DUAL SENSOR (WIRE) ×4 IMPLANT
HANDPIECE INTERPULSE COAX TIP (DISPOSABLE) ×3
HOLDER FOLEY CATH W/STRAP (MISCELLANEOUS) ×4 IMPLANT
IMMOBILIZER KNEE 20 (SOFTGOODS) ×4
IMMOBILIZER KNEE 20 THIGH 36 (SOFTGOODS) ×2 IMPLANT
PACK TOTAL KNEE CUSTOM (KITS) ×4 IMPLANT
POSITIONER SURGICAL ARM (MISCELLANEOUS) ×4 IMPLANT
SET CYSTO W/LG BORE CLAMP LF (SET/KITS/TRAYS/PACK) ×4 IMPLANT
SET HNDPC FAN SPRY TIP SCT (DISPOSABLE) ×2 IMPLANT
SET PAD KNEE POSITIONER (MISCELLANEOUS) ×4 IMPLANT
SPONGE GAUZE 2X2 STER 10/PKG (GAUZE/BANDAGES/DRESSINGS) ×2
SPONGE LAP 18X18 RF (DISPOSABLE) IMPLANT
SPONGE SURGIFOAM ABS GEL 100 (HEMOSTASIS) ×4 IMPLANT
STOCKINETTE 6  STRL (DRAPES) ×2
STOCKINETTE 6 STRL (DRAPES) ×2 IMPLANT
SUT BONE WAX W31G (SUTURE) IMPLANT
SUT MNCRL AB 3-0 PS2 18 (SUTURE) ×4 IMPLANT
SUT VIC AB 1 CT1 27 (SUTURE) ×16
SUT VIC AB 1 CT1 27XBRD ANTBC (SUTURE) ×8 IMPLANT
SUT VIC AB 2-0 CT1 27 (SUTURE) ×8
SUT VIC AB 2-0 CT1 TAPERPNT 27 (SUTURE) ×4 IMPLANT
SUT VLOC 180 0 24IN GS25 (SUTURE) ×4 IMPLANT
SYR 20CC LL (SYRINGE) ×4 IMPLANT
SYR 50ML LL SCALE MARK (SYRINGE) ×4 IMPLANT
TAPE STRIPS DRAPE STRL (GAUZE/BANDAGES/DRESSINGS) ×4 IMPLANT
TRAY FOLEY MTR SLVR 16FR STAT (SET/KITS/TRAYS/PACK) IMPLANT
WRAP KNEE MAXI GEL POST OP (GAUZE/BANDAGES/DRESSINGS) ×4 IMPLANT
YANKAUER SUCT BULB TIP 10FT TU (MISCELLANEOUS) ×4 IMPLANT

## 2017-12-15 NOTE — Interval H&P Note (Signed)
History and Physical Interval Note:  12/15/2017 1:04 PM  Harry Carroll  has presented today for surgery, with the diagnosis of Left knee osteoarthritis  The various methods of treatment have been discussed with the patient and family. After consideration of risks, benefits and other options for treatment, the patient has consented to  Procedure(s): LEFT TOTAL KNEE ARTHROPLASTY (Left) as a surgical intervention .  The patient's history has been reviewed, patient examined, no change in status, stable for surgery.  I have reviewed the patient's chart and labs.  Questions were answered to the patient's satisfaction.     Monda Chastain ANDREW

## 2017-12-15 NOTE — Anesthesia Procedure Notes (Signed)
Anesthesia Procedure Image    

## 2017-12-15 NOTE — Anesthesia Procedure Notes (Signed)
Anesthesia Regional Block: Adductor canal block   Pre-Anesthetic Checklist: ,, timeout performed, Correct Patient, Correct Site, Correct Laterality, Correct Procedure, Correct Position, site marked, Risks and benefits discussed,  Surgical consent,  Pre-op evaluation,  At surgeon's request and post-op pain management  Laterality: Left  Prep: chloraprep       Needles:  Injection technique: Single-shot  Needle Type: Stimiplex     Needle Length: 9cm      Additional Needles:   Procedures:,,,, ultrasound used (permanent image in chart),,,,  Narrative:  Start time: 12/15/2017 12:52 PM End time: 12/15/2017 1:02 PM Injection made incrementally with aspirations every 5 mL.  Events: blood aspirated,,,,,,,,,,  Performed by: Personally  Anesthesiologist: Myrtie Soman, MD  Additional Notes: Patient tolerated the procedure well without complications

## 2017-12-15 NOTE — Transfer of Care (Signed)
Immediate Anesthesia Transfer of Care Note  Patient: Harry Carroll  Procedure(s) Performed: LEFT TOTAL KNEE ARTHROPLASTY (Left Knee) CYSTOSCOPY WITH URETHRAL DILATATION (N/A Penis)  Patient Location: PACU  Anesthesia Type:Spinal  Level of Consciousness: sedated, patient cooperative and responds to stimulation  Airway & Oxygen Therapy: Patient Spontanous Breathing and Patient connected to face mask oxygen  Post-op Assessment: Report given to RN and Post -op Vital signs reviewed and stable  Post vital signs: Reviewed and stable  Last Vitals:  Vitals Value Taken Time  BP 129/87 12/15/2017  4:15 PM  Temp    Pulse 72 12/15/2017  4:17 PM  Resp    SpO2 95 % 12/15/2017  4:17 PM  Vitals shown include unvalidated device data.  Last Pain:  Vitals:   12/15/17 1304  TempSrc:   PainSc: 0-No pain      Patients Stated Pain Goal: 4 (66/06/30 1601)  Complications: No apparent anesthesia complications

## 2017-12-15 NOTE — Anesthesia Postprocedure Evaluation (Signed)
Anesthesia Post Note  Patient: Harry Carroll  Procedure(s) Performed: LEFT TOTAL KNEE ARTHROPLASTY (Left Knee) CYSTOSCOPY WITH URETHRAL DILATATION (N/A Penis)     Patient location during evaluation: PACU Anesthesia Type: Spinal Level of consciousness: awake and alert Pain management: pain level controlled Vital Signs Assessment: post-procedure vital signs reviewed and stable Respiratory status: spontaneous breathing and respiratory function stable Cardiovascular status: blood pressure returned to baseline and stable Postop Assessment: spinal receding Anesthetic complications: no    Last Vitals:  Vitals:   12/15/17 1719 12/15/17 1733  BP: 133/86 (!) 134/100  Pulse: 71   Resp: 14 16  Temp:  36.9 C  SpO2: 99% 99%    Last Pain:  Vitals:   12/15/17 1715  TempSrc:   PainSc: 4                  Tiajuana Amass

## 2017-12-15 NOTE — Progress Notes (Signed)
Assisted Dr. Rose with left, ultrasound guided, adductor canal block. Side rails up, monitors on throughout procedure. See vital signs in flow sheet. Tolerated Procedure well.  

## 2017-12-15 NOTE — Progress Notes (Signed)
Pt and wife called nurse to bedside D/T wife noticing blood on patients hand. Foley catheter checked per request because wife was concerned that bleeding was coming from there. Insertion site clean and dry, excoriated areas to scrotum noted D/T patient having scratched area prior to nurse entering room. Pt cleaned up per wife and cool, wet wash cloth placed on scrotum by wife. No active bleeding noted at this time.

## 2017-12-15 NOTE — Anesthesia Preprocedure Evaluation (Signed)
Anesthesia Evaluation  Patient identified by MRN, date of birth, ID band Patient awake    Reviewed: Allergy & Precautions, NPO status , Patient's Chart, lab work & pertinent test results  Airway Mallampati: II  TM Distance: >3 FB Neck ROM: Full    Dental no notable dental hx.    Pulmonary sleep apnea , Current Smoker,    Pulmonary exam normal breath sounds clear to auscultation       Cardiovascular negative cardio ROS Normal cardiovascular exam Rhythm:Regular Rate:Normal     Neuro/Psych negative neurological ROS  negative psych ROS   GI/Hepatic negative GI ROS, Neg liver ROS,   Endo/Other  Morbid obesity  Renal/GU negative Renal ROS  negative genitourinary   Musculoskeletal negative musculoskeletal ROS (+)   Abdominal   Peds negative pediatric ROS (+)  Hematology negative hematology ROS (+)   Anesthesia Other Findings   Reproductive/Obstetrics negative OB ROS                             Anesthesia Physical Anesthesia Plan  ASA: III  Anesthesia Plan: Spinal   Post-op Pain Management:  Regional for Post-op pain   Induction: Intravenous  PONV Risk Score and Plan: 2 and Ondansetron and Dexamethasone  Airway Management Planned: Simple Face Mask  Additional Equipment:   Intra-op Plan:   Post-operative Plan:   Informed Consent: I have reviewed the patients History and Physical, chart, labs and discussed the procedure including the risks, benefits and alternatives for the proposed anesthesia with the patient or authorized representative who has indicated his/her understanding and acceptance.   Dental advisory given  Plan Discussed with: CRNA and Surgeon  Anesthesia Plan Comments:         Anesthesia Quick Evaluation

## 2017-12-15 NOTE — Op Note (Signed)
DATE OF SURGERY:  12/15/2017  TIME: 3:45 PM  PATIENT NAME:  Harry Carroll    AGE: 57 y.o.   PRE-OPERATIVE DIAGNOSIS:  Left knee osteoarthritis; urethral stricture  POST-OPERATIVE DIAGNOSIS:  Left knee osteoarthritis; urethral stricture  PROCEDURE:  Procedure(s): LEFT TOTAL KNEE ARTHROPLASTY CYSTOSCOPY WITH URETHRAL DILATATION  SURGEON:  Tacie Mccuistion ANDREW  ASSISTANT:  Bryson Stilwell, PA-C, present and scrubbed throughout the case, critical for assistance with exposure, retraction, instrumentation, and closure.  OPERATIVE IMPLANTS: Depuy PFC Sigma Rotating Platform.  Femur size 5, Tibia size 5, Patella size 41 3-peg oval button, with a 12.5 mm polyethylene insert.   PREOPERATIVE INDICATIONS:   Harry Carroll is a 57 y.o. year old male with end stage bone on bone arthritis of the knee who failed conservative treatment and elected for Total Knee Arthroplasty.   The risks, benefits, and alternatives were discussed at length including but not limited to the risks of infection, bleeding, nerve injury, stiffness, blood clots, the need for revision surgery, cardiopulmonary complications, among others, and they were willing to proceed.  OPERATIVE DESCRIPTION:  The patient was brought to the operative room and placed in a supine position.  Spinal anesthesia was administered.  IV antibiotics were given.  The lower extremity was prepped and draped in the usual sterile fashion.  Time out was performed.  The leg was elevated and exsanguinated and the tourniquet was inflated.  Anterior quadriceps tendon splitting approach was performed.  The patella was retracted and osteophytes were removed.  The anterior horn of the medial and lateral meniscus was removed and cruciate ligaments resected.   The distal femur was opened with the drill and the intramedullary distal femoral cutting jig was utilized, set at 5 degrees resecting 10 mm off the distal femur.  Care was taken to protect the collateral  ligaments.  The distal femoral sizing jig was applied, taking care to avoid notching.  Then the 4-in-1 cutting jig was applied and the anterior and posterior femur was cut, along with the chamfer cuts.    Then the extramedullary tibial cutting jig was utilized making the appropriate cut using the anterior tibial crest as a reference building in appropriate posterior slope.  Care was taken during the cut to protect the medial and collateral ligaments.  The proximal tibia was removed along with the posterior horns of the menisci.   The posterior medial femoral osteophytes and posterior lateral femoral osteophytes were removed.    The flexion gap was then measured and was symmetric with the extension gap, measured at 12.  I completed the distal femoral preparation using the appropriate jig to prepare the box.  The patella was then measured, and cut with the saw.    The proximal tibia sized and prepared accordingly with the reamer and the punch, and then all components were trialed with the trial insert.  The knee was found to have excellent balance and full motion.    The above named components were then cemented into place and all excess cement was removed.  The trial polyethylene component was in place during cementation, and then was exchanged for the real polyethylene component.    The knee was easily taken through a range of motion and the patella tracked well and the knee irrigated copiously and the parapatellar and subcutaneous tissue closed with vicryl, and monocryl with steri strips for the skin.  The arthrotomy was closed at 90 of flexion. The wounds were dressed with sterile gauze and the tourniquet released and the  patient was awakened and returned to the PACU in stable and satisfactory condition.  There were no complications.  Total tourniquet time was 75 minutes.

## 2017-12-15 NOTE — Op Note (Signed)
Procedure: Cystoscopy with urethral dilation and insertion of Foley catheter.  Preop diagnosis: Difficult Foley placement with history of urethral stricture.  Postop diagnosis: Bulbar urethral stricture.  Surgeon: Dr. Irine Seal.  Anesthesia: General.  Drain: 63 [place] council catheter.  Specimen: None.  EBL: None.  Complications: None.  Indications: Harry Carroll is a 57 year old male who is to undergo a left knee replacement.  An initial attempted Foley catheter placement was unsuccessful so I was consulted for placement.  Procedure: I reviewed the chart and noted that the patient had prior cystoscopy with urethral dilation by Dr. Diona Fanti in the past.  He was prepped with Betadine solution and draped with sterile towels.  His urethra was instilled with 2% lidocaine jelly.  A single attempt was made with a 16 [place] coud Foley catheter without success.  I then inserted the the flexible cystoscope.  His anterior urethra was unremarkable but he had a severe stricture in the proximal bulb.  I was able to advance a sensor guidewire across the stricture into the bladder.  I then used [employer] to dilate the urethra to 24 [place].  Repeat cystoscopy demonstrated excellent disruption of the stricture and an intact external sphincter.  I did not pass the scope into the bladder.  With the guidewire still in place and 18 [place] council catheter was placed over the wire into the bladder without difficulty.  The balloon was filled with 10 mL of sterile water and the catheter was placed to straight drainage after the wire was removed.  Dr. Theda Sers then proceeded with his surgery.  There were no complications.

## 2017-12-15 NOTE — Anesthesia Procedure Notes (Signed)
Spinal  Start time: 12/15/2017 1:13 PM End time: 12/15/2017 1:18 PM Staffing Resident/CRNA: Gean Maidens, CRNA Performed: resident/CRNA  Preanesthetic Checklist Completed: patient identified, site marked, surgical consent, pre-op evaluation, timeout performed, IV checked, risks and benefits discussed and monitors and equipment checked Spinal Block Patient position: sitting Prep: DuraPrep Patient monitoring: heart rate, continuous pulse ox and blood pressure Approach: midline Location: L3-4 Injection technique: single-shot Needle Needle type: Pencan  Needle gauge: 24 G Needle length: 9 cm Needle insertion depth: 8 cm Additional Notes Pt sitting position, sterile prep and drape negative paresthesia, heme

## 2017-12-15 NOTE — Discharge Instructions (Signed)
Urethral Dilation, Care After Refer to this sheet in the next few weeks. These instructions provide you with information about caring for yourself after your procedure. Your health care provider may also give you more specific instructions. Your treatment has been planned according to current medical practices, but problems sometimes occur. Call your health care provider if you have any problems or questions after your procedure. What can I expect after the procedure? After the procedure, it is common to have:  Burning pain when urinating.  Blood in your urine.  A need to urinate frequently.  Follow these instructions at home: Medicines  Take over-the-counter and prescription medicines only as told by your health care provider.  If you were prescribed antibiotic medicine, take it as told by your health care provider. Do not stop taking the antibiotic even if you start to feel better. Driving  Do not drive or operate heavy machinery while taking prescription pain medicine.  Do not drive for 24 hours if you received a medicine to help you relax (sedative) during your procedure. General instructions   If you were sent home with a catheter, follow your health care provider's instructions about how and when to use it.  Drink enough fluid to keep your urine clear or pale yellow.  Return to your normal activities as told by your health care provider. Ask your health care provider what activities are safe for you.  Keep all follow-up visits as told by your health care provider. This is important. Contact a health care provider if:  Your urine is cloudy and smells bad.  You develop new bleeding when you urinate.  You pass blood clots when you urinate.  You have pain that does not get better with medicine. Get help right away if:  You develop new bleeding that does not stop.  You cannot pass urine.  You have a fever.  You have swelling, bruising, or discoloration of your  genital area. This includes the penis, scrotum, and inner thighs for men, and the outer genital organs (vulva) and inner thighs for women. This information is not intended to replace advice given to you by your health care provider. Make sure you discuss any questions you have with your health care provider. Document Released: 06/26/2015 Document Revised: 11/05/2015 Document Reviewed: 05/17/2015 Elsevier Interactive Patient Education  [employer].

## 2017-12-16 LAB — CBC
HCT: 39.6 % (ref 39.0–52.0)
HEMOGLOBIN: 13.8 g/dL (ref 13.0–17.0)
MCH: 32.1 pg (ref 26.0–34.0)
MCHC: 34.8 g/dL (ref 30.0–36.0)
MCV: 92.1 fL (ref 78.0–100.0)
PLATELETS: 199 10*3/uL (ref 150–400)
RBC: 4.3 MIL/uL (ref 4.22–5.81)
RDW: 13.3 % (ref 11.5–15.5)
WBC: 12 10*3/uL — ABNORMAL HIGH (ref 4.0–10.5)

## 2017-12-16 LAB — BASIC METABOLIC PANEL WITH GFR
Anion gap: 9 (ref 5–15)
BUN: 19 mg/dL (ref 6–20)
CO2: 26 mmol/L (ref 22–32)
Calcium: 8.8 mg/dL — ABNORMAL LOW (ref 8.9–10.3)
Chloride: 101 mmol/L (ref 98–111)
Creatinine, Ser: 1.13 mg/dL (ref 0.61–1.24)
GFR calc Af Amer: >60 mL/min
GFR calc non Af Amer: >60 mL/min
Glucose, Bld: 191 mg/dL — ABNORMAL HIGH (ref 70–99)
Potassium: 4.5 mmol/L (ref 3.5–5.1)
Sodium: 136 mmol/L (ref 135–145)

## 2017-12-16 NOTE — Progress Notes (Signed)
    Subjective: 1 Day Post-Op Procedure(s) (LRB): LEFT TOTAL KNEE ARTHROPLASTY (Left) CYSTOSCOPY WITH URETHRAL DILATATION (N/A) Patient reports pain as 3 on 0-10 scale.   Denies CP or SOB.  Voiding without difficulty. Positive flatus.  Pts IV came out. Objective: Vital signs in last 24 hours: Temp:  [97.3 F (36.3 C)-98.5 F (36.9 C)] 97.4 F (36.3 C) (07/06 0507) Pulse Rate:  [65-98] 75 (07/06 0507) Resp:  [9-20] 18 (07/06 0507) BP: (112-153)/(76-101) 131/85 (07/06 0507) SpO2:  [92 %-100 %] 98 % (07/06 0507) Weight:  [118.8 kg (262 lb)] 118.8 kg (262 lb) (07/05 1132)  Intake/Output from previous day: 07/05 0701 - 07/06 0700 In: 2313.3 [P.O.:120; I.V.:2138.3; IV Piggyback:55] Out: 8592 [Urine:1300; Drains:320; Blood:50] Intake/Output this shift: No intake/output data recorded.  Labs: Recent Labs    12/16/17 0502  HGB 13.8   Recent Labs    12/16/17 0502  WBC 12.0*  RBC 4.30  HCT 39.6  PLT 199   Recent Labs    12/16/17 0502  NA 136  K 4.5  CL 101  CO2 26  BUN 19  CREATININE 1.13  GLUCOSE 191*  CALCIUM 8.8*   No results for input(s): LABPT, INR in the last 72 hours.  Physical Exam: Neurologically intact ABD soft Sensation intact distally Dorsiflexion/Plantar flexion intact Incision: dressing C/D/I Compartment soft Body mass index is 38.69 kg/m.   Assessment/Plan: 1 Day Post-Op Procedure(s) (LRB): LEFT TOTAL KNEE ARTHROPLASTY (Left) CYSTOSCOPY WITH URETHRAL DILATATION (N/A) Advance diet Up with therapy  Pt to DC Sunday  We will leave IV out for now  Darlinda Bellows, Darla Lesches for Dr. Melina Schools [facility] [phone] 12/16/2017, 8:29 AM

## 2017-12-16 NOTE — Progress Notes (Signed)
Physical Therapy Treatment Patient Details Name: Harry Carroll MRN: [id number] DOB: February 01, 1961 Today's Date: 12/16/2017    History of Present Illness Pt s/p L TKR    PT Comments    Pt continues motivated and progressing well with mobility.  Pt hopeful for return home tomorrow.   Follow Up Recommendations  Follow surgeon's recommendation for DC plan and follow-up therapies     Equipment Recommendations  Rolling walker with 5" wheels    Recommendations for Other Services       Precautions / Restrictions Precautions Precautions: Knee;Fall Restrictions Weight Bearing Restrictions: No LLE Weight Bearing: Weight bearing as tolerated    Mobility  Bed Mobility Overal bed mobility: Needs Assistance Bed Mobility: Supine to Sit;Sit to Supine     Supine to sit: Min guard Sit to supine: Min guard   General bed mobility comments: cues for sequence and use of UEs to self assist  Transfers Overall transfer level: Needs assistance Equipment used: Rolling walker (2 wheeled) Transfers: Sit to/from Stand Sit to Stand: Min guard         General transfer comment: cues for LE management and use of UEs to self assist  Ambulation/Gait Ambulation/Gait assistance: Min guard;Supervision Gait Distance (Feet): 130 Feet Assistive device: Rolling walker (2 wheeled) Gait Pattern/deviations: Step-to pattern;Step-through pattern;Decreased step length - right;Decreased step length - left;Shuffle;Trunk flexed Gait velocity: decr   General Gait Details: min cues for posture, position from RW, and initial sequence   Stairs             Wheelchair Mobility    Modified Rankin (Stroke Patients Only)       Balance Overall balance assessment: No apparent balance deficits (not formally assessed)                                          Cognition Arousal/Alertness: Awake/alert Behavior During Therapy: WFL for tasks assessed/performed Overall Cognitive Status:  Within Functional Limits for tasks assessed                                        Exercises Total Joint Exercises Ankle Circles/Pumps: AROM;Both;Supine;20 reps Quad Sets: AROM;Both;10 reps;Supine Heel Slides: AAROM;Left;15 reps;Supine Straight Leg Raises: AAROM;AROM;Left;15 reps;Supine    General Comments        Pertinent Vitals/Pain Pain Assessment: 0-10 Pain Score: 5  Pain Location: L knee Pain Descriptors / Indicators: Aching;Sore Pain Intervention(s): Limited activity within patient's tolerance;Monitored during session;Premedicated before session;Ice applied    Home Living                      Prior Function            PT Goals (current goals can now be found in the care plan section) Acute Rehab PT Goals Patient Stated Goal: Regain IND PT Goal Formulation: With patient Time For Goal Achievement: 12/23/17 Potential to Achieve Goals: Good Progress towards PT goals: Progressing toward goals    Frequency    7X/week      PT Plan Current plan remains appropriate    Co-evaluation              AM-PAC PT "6 Clicks" Daily Activity  Outcome Measure  Difficulty turning over in bed (including adjusting bedclothes, sheets and blankets)?: A Lot Difficulty moving from  lying on back to sitting on the side of the bed? : A Lot Difficulty sitting down on and standing up from a chair with arms (e.g., wheelchair, bedside commode, etc,.)?: A Lot Help needed moving to and from a bed to chair (including a wheelchair)?: A Little Help needed walking in hospital room?: A Little Help needed climbing 3-5 steps with a railing? : A Little 6 Click Score: 15    End of Session Equipment Utilized During Treatment: Gait belt Activity Tolerance: Patient tolerated treatment well Patient left: in bed;with call bell/phone within reach;with family/visitor present Nurse Communication: Mobility status PT Visit Diagnosis: Difficulty in walking, not elsewhere  classified (R26.2)     Time: 0938-1829 PT Time Calculation (min) (ACUTE ONLY): 26 min  Charges:  $Gait Training: 8-22 mins $Therapeutic Exercise: 8-22 mins                    G Codes:       Pg [phone]    Harry Carroll 12/16/2017, 4:07 PM

## 2017-12-16 NOTE — Progress Notes (Signed)
Pt reported on rounds about feeling sick. IV team re-established IV access, new fluids and IV tubing hung at this time. Pt medicated per request for pain medicine and something for nausea.                                                                                             n

## 2017-12-16 NOTE — Evaluation (Signed)
Physical Therapy Evaluation Patient Details Name: Harry Carroll MRN: [id number] DOB: 06-08-1961 Today's Date: 12/16/2017   History of Present Illness  Pt s/p L TKR  Clinical Impression  Pt s/p L TKR and presents with decreased L LE strength/ROM and post op pain limiting functional mobility.  Pt should progress to dc home with family assist.    Follow Up Recommendations Follow surgeon's recommendation for DC plan and follow-up therapies    Equipment Recommendations  Rolling walker with 5" wheels    Recommendations for Other Services       Precautions / Restrictions Precautions Precautions: Knee;Fall Required Braces or Orthoses: Knee Immobilizer - Left Knee Immobilizer - Left: Discontinue once straight leg raise with < 10 degree lag(Pt performed IND SLR this am) Restrictions Weight Bearing Restrictions: No LLE Weight Bearing: Weight bearing as tolerated      Mobility  Bed Mobility Overal bed mobility: Needs Assistance Bed Mobility: Supine to Sit     Supine to sit: Min assist     General bed mobility comments: cues for sequence and use of UEs to self assist  Transfers Overall transfer level: Needs assistance Equipment used: Rolling walker (2 wheeled) Transfers: Sit to/from Stand Sit to Stand: Min assist         General transfer comment: cues for LE management and use of UEs to self assist  Ambulation/Gait Ambulation/Gait assistance: Min assist;Min guard Gait Distance (Feet): 140 Feet Assistive device: Rolling walker (2 wheeled) Gait Pattern/deviations: Step-to pattern;Step-through pattern;Decreased step length - right;Decreased step length - left;Shuffle;Trunk flexed Gait velocity: decr   General Gait Details: cues for posture, position from RW, and initial sequence  Stairs            Wheelchair Mobility    Modified Rankin (Stroke Patients Only)       Balance Overall balance assessment: No apparent balance deficits (not formally assessed)                                            Pertinent Vitals/Pain Pain Assessment: 0-10 Pain Score: 5  Pain Location: L knee Pain Descriptors / Indicators: Aching;Sore Pain Intervention(s): Limited activity within patient's tolerance;Monitored during session;Premedicated before session;Ice applied    Home Living Family/patient expects to be discharged to:: Private residence Living Arrangements: Spouse/significant other Available Help at Discharge: Family Type of Home: House Home Access: Stairs to enter Entrance Stairs-Rails: Right Entrance Stairs-Number of Steps: 3 Home Layout: Two level [place] - single point;Crutches;Other (comment)(commode riser)      Prior Function Level of Independence: Independent               Hand Dominance        Extremity/Trunk Assessment   Upper Extremity Assessment Upper Extremity Assessment: Overall WFL for tasks assessed    Lower Extremity Assessment Lower Extremity Assessment: LLE deficits/detail LLE Deficits / Details: 3/5 quads with IND SLR; AAROM at knee -10- 50    Cervical / Trunk Assessment Cervical / Trunk Assessment: Normal  Communication   Communication: No difficulties  Cognition Arousal/Alertness: Awake/alert Behavior During Therapy: WFL for tasks assessed/performed Overall Cognitive Status: Within Functional Limits for tasks assessed                                        General Comments  Exercises Total Joint Exercises Ankle Circles/Pumps: AROM;Both;Supine;20 reps Quad Sets: AROM;Both;10 reps;Supine Heel Slides: AAROM;Left;15 reps;Supine Straight Leg Raises: AAROM;AROM;Left;15 reps;Supine   Assessment/Plan    PT Assessment Patient needs continued PT services  PT Problem List Decreased strength;Decreased range of motion;Decreased activity tolerance;Decreased mobility;Decreased knowledge of use of DME;Pain       PT Treatment Interventions DME  instruction;Gait training;Stair training;Functional mobility training;Therapeutic activities;Therapeutic exercise;Patient/family education    PT Goals (Current goals can be found in the Care Plan section)  Acute Rehab PT Goals Patient Stated Goal: Regain IND PT Goal Formulation: With patient Time For Goal Achievement: 12/23/17 Potential to Achieve Goals: Good    Frequency 7X/week   Barriers to discharge        Co-evaluation               AM-PAC PT "6 Clicks" Daily Activity  Outcome Measure Difficulty turning over in bed (including adjusting bedclothes, sheets and blankets)?: A Lot Difficulty moving from lying on back to sitting on the side of the bed? : A Lot Difficulty sitting down on and standing up from a chair with arms (e.g., wheelchair, bedside commode, etc,.)?: A Lot Help needed moving to and from a bed to chair (including a wheelchair)?: A Little Help needed walking in hospital room?: A Little Help needed climbing 3-5 steps with a railing? : A Little 6 Click Score: 15    End of Session Equipment Utilized During Treatment: Gait belt Activity Tolerance: Patient tolerated treatment well Patient left: in chair;with call bell/phone within reach;with family/visitor present Nurse Communication: Mobility status PT Visit Diagnosis: Difficulty in walking, not elsewhere classified (R26.2)    Time: 3335-4562 PT Time Calculation (min) (ACUTE ONLY): 27 min   Charges:   PT Evaluation $PT Eval Low Complexity: 1 Low PT Treatments $Therapeutic Exercise: 8-22 mins   PT G Codes:        Pg [phone]   Mardi Cannady 12/16/2017, 11:43 AM

## 2017-12-17 LAB — CBC
HCT: 38.1 % — ABNORMAL LOW (ref 39.0–52.0)
HEMOGLOBIN: 13.1 g/dL (ref 13.0–17.0)
MCH: 32.1 pg (ref 26.0–34.0)
MCHC: 34.4 g/dL (ref 30.0–36.0)
MCV: 93.4 fL (ref 78.0–100.0)
Platelets: 192 10*3/uL (ref 150–400)
RBC: 4.08 MIL/uL — AB (ref 4.22–5.81)
RDW: 13.5 % (ref 11.5–15.5)
WBC: 11.1 10*3/uL — AB (ref 4.0–10.5)

## 2017-12-17 NOTE — Progress Notes (Signed)
Subjective: 2 Days Post-Op Procedure(s) (LRB): LEFT TOTAL KNEE ARTHROPLASTY (Left) CYSTOSCOPY WITH URETHRAL DILATATION (N/A) Patient reports pain as 3 on 0-10 scale. Harry Carroll is sitting comfortably in his chair. He has a slight increase in pain due to completing therapy this morning. He is tolerating therapy well.  The urinary catheter is still in place.  He is passing flatus but has not had a bowel movement and states that he was vomiting yesterday after eating.  He denies chest pain, shortness of breath, fever, or chills.     Objective: Vital signs in last 24 hours: Temp:  [98.5 F (36.9 C)-99.3 F (37.4 C)] 98.5 F (36.9 C) (07/07 0557) Pulse Rate:  [85-110] 85 (07/07 0557) Resp:  [14-15] 15 (07/06 2049) BP: (129-168)/(84-97) 129/84 (07/07 0557) SpO2:  [95 %-97 %] 96 % (07/07 0557)  Intake/Output from previous day: 07/06 0701 - 07/07 0700 In: 1728.8 [P.O.:540; I.V.:1188.8] Out: 2050 [Urine:2050] Intake/Output this shift: Total I/O In: 0  Out: 200 [Urine:200]  Recent Labs    12/16/17 0502 12/17/17 0503  HGB 13.8 13.1   Recent Labs    12/16/17 0502 12/17/17 0503  WBC 12.0* 11.1*  RBC 4.30 4.08*  HCT 39.6 38.1*  PLT 199 192   Recent Labs    12/16/17 0502  NA 136  K 4.5  CL 101  CO2 26  BUN 19  CREATININE 1.13  GLUCOSE 191*  CALCIUM 8.8*   No results for input(s): LABPT, INR in the last 72 hours.  Alert and oriented x 4 Sensation intact distally Intact pulses distally Dorsiflexion/Plantar flexion intact Incision: dressing C/D/I Compartment soft   Assessment/Plan: 2 Days Post-Op Procedure(s) (LRB): LEFT TOTAL KNEE ARTHROPLASTY (Left) CYSTOSCOPY WITH URETHRAL DILATATION (N/A) Advance diet Up with therapy - WBAT to the LLE Continue with pain management as needed.  If the patient is able to have a bowel movement, then he will be discharged today.  Continue with the urinary catheter. Patient will follow-up with urology as an outpatient for  continued treatment.    Harry Carroll 12/17/2017, 11:03 AM

## 2017-12-17 NOTE — Care Management Note (Signed)
Case Management Note  Patient Details  Name: Harry Carroll MRN: [id number] Date of Birth: Nov 19, 1960  Subjective/Objective:   Left TKA                 Action/Plan: NCM spoke to pt and wife at bedside. Offered choice for HH/list provided. Pt agreeable to [facility]. [facility] delivered RW and 3n1 bedside commode to room.   Expected Discharge Date:  12/16/17               Expected Discharge Plan:  [place]  In-House Referral:  NA  Discharge planning Services  CM Consult  Post Acute Care Choice:  Home Health Choice offered to:  Patient  DME Arranged:  3-N-1, Walker rolling DME Agency:  [place]:  PT [place] Agency:  [facility] (formerly [facility])  Status of Service:  Completed, signed off  If discussed at [employer], dates discussed:    Additional Comments:  Erenest Rasher, RN 12/17/2017, 11:42 AM

## 2017-12-17 NOTE — Progress Notes (Signed)
Physical Therapy Treatment Patient Details Name: Harry Carroll MRN: [id number] DOB: December 28, 1960 Today's Date: 12/17/2017    History of Present Illness Pt s/p L TKR    PT Comments    Pt ambulated in hallway and practiced safe stair sequencing.  Spouse present for session and observed.  Pt also performed LE exercises in recliner and provided with HEP handout.  Pt is uncertain of d/c plan due to foley.  Pt prefers HHPT if d/c home with foley catheter.  Pt also would benefit from RW and BSC upon d/c.    Follow Up Recommendations  Follow surgeon's recommendation for DC plan and follow-up therapies     Equipment Recommendations  Rolling walker with 5" wheels and 3 in 1 [facility])   Recommendations for Other Services       Precautions / Restrictions Precautions Precautions: Knee;Fall Precaution Comments: able to perform SLR Restrictions LLE Weight Bearing: Weight bearing as tolerated    Mobility  Bed Mobility Overal bed mobility: Needs Assistance Bed Mobility: Supine to Sit     Supine to sit: Supervision        Transfers Overall transfer level: Needs assistance Equipment used: Rolling walker (2 wheeled) Transfers: Sit to/from Stand Sit to Stand: Min guard         General transfer comment: verbal cues for UE and LE positioning  Ambulation/Gait Ambulation/Gait assistance: Min guard;Supervision Gait Distance (Feet): 120 Feet Assistive device: Rolling walker (2 wheeled) Gait Pattern/deviations: Step-to pattern;Decreased stance time - left;Antalgic     General Gait Details: verbal cues for RW positioning, heel strike   Stairs Stairs: Yes Stairs assistance: Min guard Stair Management: Step to pattern;Forwards;With crutches;One rail Right Number of Stairs: 5 General stair comments: verbal cues for sequence and safety, performed with R rail and crutch on left side, performed about 5 steps twice, pt reports understanding, spouse observed   Wheelchair Mobility     Modified Rankin (Stroke Patients Only)       Balance                                            Cognition Arousal/Alertness: Awake/alert Behavior During Therapy: WFL for tasks assessed/performed Overall Cognitive Status: Within Functional Limits for tasks assessed                                        Exercises Total Joint Exercises Ankle Circles/Pumps: AROM;Both;Supine;20 reps Quad Sets: AROM;Both;10 reps;Supine Short Arc Quad: 10 reps;AAROM;Left Heel Slides: AAROM;Left;15 reps;Seated Hip ABduction/ADduction: AROM;10 reps;Left Straight Leg Raises: AROM;Left;Supine;10 reps    General Comments        Pertinent Vitals/Pain Pain Assessment: 0-10 Pain Score: 4  Pain Location: L knee Pain Descriptors / Indicators: Aching;Sore Pain Intervention(s): Limited activity within patient's tolerance;Repositioned;Monitored during session;Ice applied    Home Living                      Prior Function            PT Goals (current goals can now be found in the care plan section) Progress towards PT goals: Progressing toward goals    Frequency    7X/week      PT Plan Current plan remains appropriate    Co-evaluation  AM-PAC PT "6 Clicks" Daily Activity  Outcome Measure  Difficulty turning over in bed (including adjusting bedclothes, sheets and blankets)?: A Little Difficulty moving from lying on back to sitting on the side of the bed? : A Little Difficulty sitting down on and standing up from a chair with arms (e.g., wheelchair, bedside commode, etc,.)?: A Little Help needed moving to and from a bed to chair (including a wheelchair)?: A Little Help needed walking in hospital room?: A Little Help needed climbing 3-5 steps with a railing? : A Little 6 Click Score: 18    End of Session   Activity Tolerance: Patient tolerated treatment well Patient left: with call bell/phone within reach;with  family/visitor present;in chair Nurse Communication: Mobility status PT Visit Diagnosis: Difficulty in walking, not elsewhere classified (R26.2)     Time: 4287-6811 PT Time Calculation (min) (ACUTE ONLY): 33 min  Charges:  $Gait Training: 8-22 mins $Therapeutic Exercise: 8-22 mins                    G Codes:      Carmelia Bake, PT, DPT 12/17/2017 Pager: [phone]  [name] 12/17/2017, 1:36 PM

## 2017-12-17 NOTE — Discharge Summary (Signed)
Physician Discharge Summary  Patient ID: Harry Carroll MRN: [id number] DOB/AGE: Mar 20, 1961 57 y.o.  Admit date: 12/15/2017 Discharge date: 7/819  Admission Diagnoses:Left knee primary OA  Discharge Diagnoses:  Active Problems:   S/P knee replacement   Discharged Condition: good  Hospital Course:  Harry Carroll is a 57 y.o. who was admitted to [facility]. They were brought to the operating room on 12/15/2017 and underwent Procedure(s): LEFT TOTAL KNEE ARTHROPLASTY CYSTOSCOPY WITH URETHRAL DILATATION.  Patient tolerated the procedure well and was later transferred to the recovery room and then to the orthopaedic floor for postoperative care.  They were given PO and IV analgesics for pain control following their surgery.  They were given 24 hours of postoperative antibiotics of  Anti-infectives (From admission, onward)   Start     Dose/Rate Route Frequency Ordered Stop   12/15/17 2000  ceFAZolin (ANCEF) IVPB 2g/100 mL premix     2 g 200 mL/hr over 30 Minutes Intravenous Every 6 hours 12/15/17 1738 12/16/17 0252   12/15/17 1115  ceFAZolin (ANCEF) IVPB 2g/100 mL premix     2 g 200 mL/hr over 30 Minutes Intravenous On call to O.R. 12/15/17 1106 12/15/17 1402     and started on DVT prophylaxis in the form of lovenox. PT and OT were ordered for total joint protocol.  Discharge planning consulted to help with postop disposition and equipment needs.  Patient had a good night on the evening of surgery and started to get up OOB with therapy on day one.  Hemovac drain was pulled without difficulty.  Continued to work with therapy into day two.  Dressing was with normal limits.  The patient had progressed with therapy and meeting their goals. Patient was seen in rounds and was ready to go home.  Consults: urology  Significant Diagnostic Studies: routine  Treatments: Foley placement by urology  Discharge Exam: Blood pressure (!) 160/98, pulse 97, temperature 98.4 F (36.9 C),  temperature source Oral, resp. rate 15, height 5\' 9"  (1.753 m), weight 118.8 kg (262 lb), SpO2 94 %. Alert and oriented x3. RRR, Lungs clear, BS x4. Left Calf soft and non tender. L knee dressing C/D/I. No DVT signs. No signs of infection or compartment syndrome. LLE grossly neurovascularly intact.   Disposition:   Discharge Instructions    Call MD / Call 911   Complete by:  As directed    If you experience chest pain or shortness of breath, CALL 911 and be transported to the hospital emergency room.  If you develope a fever above 101 F, pus (white drainage) or increased drainage or redness at the wound, or calf pain, call your surgeon's office.   Constipation Prevention   Complete by:  As directed    Drink plenty of fluids.  Prune juice may be helpful.  You may use a stool softener, such as Colace (over the counter) 100 mg twice a day.  Use MiraLax (over the counter) for constipation as needed.   Diet - low sodium heart healthy   Complete by:  As directed    Discharge instructions   Complete by:  As directed    INSTRUCTIONS AFTER JOINT REPLACEMENT   Remove items at home which could result in a fall. This includes throw rugs or furniture in walking pathways ICE to the affected joint every three hours while awake for 30 minutes at a time, for at least the first 3-5 days, and then as needed for pain and swelling.  Continue  to use ice for pain and swelling. You may notice swelling that will progress down to the foot and ankle.  This is normal after surgery.  Elevate your leg when you are not up walking on it.   Continue to use the breathing machine you got in the hospital (incentive spirometer) which will help keep your temperature down.  It is common for your temperature to cycle up and down following surgery, especially at night when you are not up moving around and exerting yourself.  The breathing machine keeps your lungs expanded and your temperature down.   DIET:  As you were doing prior  to hospitalization, we recommend a well-balanced diet.  DRESSING / WOUND CARE / SHOWERING  Keep the surgical dressing until follow up.  The dressing is water proof, so you can shower without any extra covering.  IF THE DRESSING FALLS OFF or the wound gets wet inside, change the dressing with sterile gauze.  Please use good hand washing techniques before changing the dressing.  Do not use any lotions or creams on the incision until instructed by your surgeon.    ACTIVITY  Increase activity slowly as tolerated, but follow the weight bearing instructions below.   No driving for 6 weeks or until further direction given by your physician.  You cannot drive while taking narcotics.  No lifting or carrying greater than 10 lbs. until further directed by your surgeon. Avoid periods of inactivity such as sitting longer than an hour when not asleep. This helps prevent blood clots.  You may return to work once you are authorized by your doctor.     WEIGHT BEARING   Weight bearing as tolerated with assist device (walker, cane, etc) as directed, use it as long as suggested by your surgeon or therapist, typically at least 4-6 weeks.   EXERCISES  Results after joint replacement surgery are often greatly improved when you follow the exercise, range of motion and muscle strengthening exercises prescribed by your doctor. Safety measures are also important to protect the joint from further injury. Any time any of these exercises cause you to have increased pain or swelling, decrease what you are doing until you are comfortable again and then slowly increase them. If you have problems or questions, call your caregiver or physical therapist for advice.   Rehabilitation is important following a joint replacement. After just a few days of immobilization, the muscles of the leg can become weakened and shrink (atrophy).  These exercises are designed to build up the tone and strength of the thigh and leg muscles and to  improve motion. Often times heat used for twenty to thirty minutes before working out will loosen up your tissues and help with improving the range of motion but do not use heat for the first two weeks following surgery (sometimes heat can increase post-operative swelling).   These exercises can be done on a training (exercise) mat, on the floor, on a table or on a bed. Use whatever works the best and is most comfortable for you.    Use music or television while you are exercising so that the exercises are a pleasant break in your day. This will make your life better with the exercises acting as a break in your routine that you can look forward to.   Perform all exercises about fifteen times, three times per day or as directed.  You should exercise both the operative leg and the other leg as well.   Exercises include:  Quad Sets - Tighten up the muscle on the front of the thigh (Quad) and hold for 5-10 seconds.   Straight Leg Raises - With your knee straight (if you were given a brace, keep it on), lift the leg to 60 degrees, hold for 3 seconds, and slowly lower the leg.  Perform this exercise against resistance later as your leg gets stronger.  Leg Slides: Lying on your back, slowly slide your foot toward your buttocks, bending your knee up off the floor (only go as far as is comfortable). Then slowly slide your foot back down until your leg is flat on the floor again.  Angel Wings: Lying on your back spread your legs to the side as far apart as you can without causing discomfort.  Hamstring Strength:  Lying on your back, push your heel against the floor with your leg straight by tightening up the muscles of your buttocks.  Repeat, but this time bend your knee to a comfortable angle, and push your heel against the floor.  You may put a pillow under the heel to make it more comfortable if necessary.   A rehabilitation program following joint replacement surgery can speed recovery and prevent re-injury  in the future due to weakened muscles. Contact your doctor or a physical therapist for more information on knee rehabilitation.    CONSTIPATION  Constipation is defined medically as fewer than three stools per week and severe constipation as less than one stool per week.  Even if you have a regular bowel pattern at home, your normal regimen is likely to be disrupted due to multiple reasons following surgery.  Combination of anesthesia, postoperative narcotics, change in appetite and fluid intake all can affect your bowels.   YOU MUST use at least one of the following options; they are listed in order of increasing strength to get the job done.  They are all available over the counter, and you may need to use some, POSSIBLY even all of these options:    Drink plenty of fluids (prune juice may be helpful) and high fiber foods Colace 100 mg by mouth twice a day  Senokot for constipation as directed and as needed Dulcolax (bisacodyl), take with full glass of water  Miralax (polyethylene glycol) once or twice a day as needed.  If you have tried all these things and are unable to have a bowel movement in the first 3-4 days after surgery call either your surgeon or your primary doctor.    If you experience loose stools or diarrhea, hold the medications until you stool forms back up.  If your symptoms do not get better within 1 week or if they get worse, check with your doctor.  If you experience "the worst abdominal pain ever" or develop nausea or vomiting, please contact the office immediately for further recommendations for treatment.   ITCHING:  If you experience itching with your medications, try taking only a single pain pill, or even half a pain pill at a time.  You can also use Benadryl over the counter for itching or also to help with sleep.   TED HOSE STOCKINGS:  Use stockings on both legs until for at least 2 weeks or as directed by physician office. They may be removed at night for  sleeping.  MEDICATIONS:  See your medication summary on the "After Visit Summary" that nursing will review with you.  You may have some home medications which will be placed on hold until you complete the course  of blood thinner medication.  It is important for you to complete the blood thinner medication as prescribed.  PRECAUTIONS:  If you experience chest pain or shortness of breath - call 911 immediately for transfer to the hospital emergency department.   If you develop a fever greater that 101 F, purulent drainage from wound, increased redness or drainage from wound, foul odor from the wound/dressing, or calf pain - CONTACT YOUR SURGEON.                                                   FOLLOW-UP APPOINTMENTS:  If you do not already have a post-op appointment, please call the office for an appointment to be seen by your surgeon.  Guidelines for how soon to be seen are listed in your "After Visit Summary", but are typically between 1-4 weeks after surgery.  OTHER INSTRUCTIONS:   Knee Replacement:  Do not place pillow under knee, focus on keeping the knee straight while resting. CPM instructions: 0-90 degrees, 2 hours in the morning, 2 hours in the afternoon, and 2 hours in the evening. Place foam block, curve side up under heel at all times except when in CPM or when walking.  DO NOT modify, tear, cut, or change the foam block in any way.  MAKE SURE YOU:  Understand these instructions.  Get help right away if you are not doing well or get worse.    Thank you for letting us be a part of your medical care team.  It is a privilege we respect greatly.  We hope these instructions will help you stay on track for a fast and full recovery!   Increase activity slowly as tolerated   Complete by:  As directed      Allergies as of 12/17/2017      Reactions   Ciprofloxacin Shortness Of Breath, Nausea And Vomiting, Nausea Only, Rash, Other (See Comments)   Marked mucous production   Dilaudid  [hydromorphone Hcl] Shortness Of Breath, Nausea Only   Patient developed rash, excessive mucous production and chest pain - tolerates morphine   Hydrocodone Itching      Medication List    STOP taking these medications   azithromycin 250 MG tablet Commonly known as:  ZITHROMAX   naproxen sodium 220 MG tablet Commonly known as:  ALEVE     TAKE these medications   aspirin EC 325 MG tablet Take 1 tablet (325 mg total) by mouth 2 (two) times daily.   methocarbamol 500 MG tablet Commonly known as:  ROBAXIN Take 1 tablet (500 mg total) by mouth every 8 (eight) hours as needed for muscle spasms.   oxyCODONE 5 MG immediate release tablet Commonly known as:  ROXICODONE Take 1 tablet (5 mg total) by mouth every 4 (four) hours as needed for up to 7 days.   rosuvastatin 20 MG tablet Commonly known as:  CRESTOR Take 1 tablet (20 mg total) by mouth daily. What changed:  when to take this            Durable Medical Equipment  (From admission, onward)        Start     Ordered   12/17/17 1047  For home use only DME 3 n 1  Once     12/17/17 1046   12/17/17 1047  For home use only DME Gilford Rile  rolling  Once    Question:  Patient needs a walker to treat with the following condition  Answer:  S/P total knee arthroplasty, left   12/17/17 1046     Follow-up Information    Franchot Gallo, MD.   Specialty:  Urology Why:  Please contact the office for a f/u appointment for 2-3 weeks from discharge.   Contact information: [address] [place] [address] [phone]        [facility] Follow up.   Specialty:  Home Health Services Why:  Home Health Physical Therapy- agency will call to arrange initial visit Contact information: [place] [address] [phone]           Signed: Lajean Manes 12/17/2017, 4:28 PM

## 2017-12-18 ENCOUNTER — Encounter (HOSPITAL_COMMUNITY): Payer: Self-pay | Admitting: Specialist

## 2017-12-18 LAB — CBC
HCT: 40.4 % (ref 39.0–52.0)
Hemoglobin: 13.9 g/dL (ref 13.0–17.0)
MCH: 32 pg (ref 26.0–34.0)
MCHC: 34.4 g/dL (ref 30.0–36.0)
MCV: 92.9 fL (ref 78.0–100.0)
Platelets: 187 10*3/uL (ref 150–400)
RBC: 4.35 MIL/uL (ref 4.22–5.81)
RDW: 13.1 % (ref 11.5–15.5)
WBC: 11 10*3/uL — AB (ref 4.0–10.5)

## 2017-12-18 MED ORDER — PROMETHAZINE HCL 25 MG/ML IJ SOLN
25.0000 mg | Freq: Four times a day (QID) | INTRAMUSCULAR | Status: DC | PRN
  Administered 2017-12-18: 25 mg via INTRAVENOUS
  Filled 2017-12-18: qty 1

## 2017-12-18 MED ORDER — BISACODYL 10 MG RE SUPP
10.0000 mg | Freq: Once | RECTAL | Status: AC
  Administered 2017-12-18: 10 mg via RECTAL
  Filled 2017-12-18: qty 1

## 2017-12-18 MED ORDER — BISACODYL 10 MG RE SUPP: 10.0000 mg | Freq: Once | RECTAL | Status: DC

## 2017-12-18 MED ORDER — POLYETHYLENE GLYCOL 3350 17 G PO PACK
17.0000 g | PACK | Freq: Every day | ORAL | Status: DC
  Administered 2017-12-18: 17 g via ORAL
  Filled 2017-12-18: qty 1

## 2017-12-18 NOTE — Progress Notes (Addendum)
Physical Therapy Treatment Patient Details Name: Harry Carroll MRN: [id number] DOB: 02/11/1961 Today's Date: 12/18/2017    History of Present Illness Pt s/p L TKR    PT Comments    POD #3 assisted patient with supine to sit, sit to stand, gait, and stairs. Patient was sleeping upon arrival and complained of sleepiness due to meds throughout the treatment. Educated patient and spouse on single step up/dn with RW and 2 steps up/dn with singled crutch. Had patient's spouse supervise therex and he was able to correctly perform all exercises. Patient reported a slight increase in pain after therapy.   Follow Up Recommendations  Follow surgeon's recommendation for DC plan and follow-up therapies     Equipment Recommendations  Rolling walker with 5" wheels    Recommendations for Other Services       Precautions / Restrictions Precautions Precautions: Knee;Fall Precaution Comments: able to perform SLR Required Braces or Orthoses: Knee Immobilizer - Left Knee Immobilizer - Left: Discontinue once straight leg raise with < 10 degree lag Restrictions Weight Bearing Restrictions: No LLE Weight Bearing: Weight bearing as tolerated    Mobility  Bed Mobility Overal bed mobility: Needs Assistance Bed Mobility: Supine to Sit     Supine to sit: Supervision        Transfers Overall transfer level: Needs assistance Equipment used: Rolling walker (2 wheeled) Transfers: Sit to/from Stand Sit to Stand: Min guard            Ambulation/Gait Ambulation/Gait assistance: Min guard Gait Distance (Feet): 120 Feet Assistive device: Rolling walker (2 wheeled) Gait Pattern/deviations: Step-through pattern;Decreased stride length;Antalgic Gait velocity: decreased       Stairs Stairs: Yes Stairs assistance: Min guard Stair Management: Step to pattern;Forwards;With crutches;One rail Right;With walker(1 step RW; 2 step single crutch) Number of Stairs: 2     Wheelchair Mobility     Modified Rankin (Stroke Patients Only)       Balance                                            Cognition Arousal/Alertness: Awake/alert Behavior During Therapy: WFL for tasks assessed/performed Overall Cognitive Status: Within Functional Limits for tasks assessed                                 General Comments: Patient c/o sleepiness      Exercises Total Joint Exercises Ankle Circles/Pumps: AROM;Both;Seated;5 reps Quad Sets: AROM;Left;5 reps;Seated Towel Squeeze: AROM;Both;5 reps Short Arc Quad: AROM;Left;Seated;5 reps Heel Slides: AROM;Left;5 reps;Seated Hip ABduction/ADduction: AROM;Left;5 reps;Seated Straight Leg Raises: AROM;Left;5 reps;Seated    General Comments        Pertinent Vitals/Pain Pain Assessment: 0-10 Pain Score: 2  Pain Descriptors / Indicators: Aching;Sore    Home Living                      Prior Function            PT Goals (current goals can now be found in the care plan section) Progress towards PT goals: Progressing toward goals    Frequency    7X/week      PT Plan Current plan remains appropriate    Co-evaluation              AM-PAC PT "6 Clicks" Daily Activity  Outcome  Measure  Difficulty turning over in bed (including adjusting bedclothes, sheets and blankets)?: A Little Difficulty moving from lying on back to sitting on the side of the bed? : A Little Difficulty sitting down on and standing up from a chair with arms (e.g., wheelchair, bedside commode, etc,.)?: A Little Help needed moving to and from a bed to chair (including a wheelchair)?: A Little Help needed walking in hospital room?: A Little Help needed climbing 3-5 steps with a railing? : A Little 6 Click Score: 18    End of Session Equipment Utilized During Treatment: Gait belt Activity Tolerance: Patient tolerated treatment well Patient left: in chair;with call bell/phone within reach;with family/visitor  present Nurse Communication: Mobility status PT Visit Diagnosis: Difficulty in walking, not elsewhere classified (R26.2)     Time: 4742-5956 PT Time Calculation (min) (ACUTE ONLY): 39 min  Charges:  $Gait Training: 8-22 mins $Therapeutic Exercise: 8-22 mins $Therapeutic Activity: 8-22 mins                    G Codes:       Rachel Bo. SPTA 12/18/2017, 12:53 PM  dierct supervision and agree with above  Rica Koyanagi  PTA [facility]  Acute  Rehab Pager      [phone]

## 2017-12-18 NOTE — Progress Notes (Signed)
Subjective: 3 Days Post-Op Procedure(s) (LRB): LEFT TOTAL KNEE ARTHROPLASTY (Left) CYSTOSCOPY WITH URETHRAL DILATATION (N/A) Patient reports pain as mild to knee.  Limited appetite. Vomiting resolved. Denies CP or SOB. Progress with PT.    Objective: Vital signs in last 24 hours: Temp:  [98.4 F (36.9 C)-99.7 F (37.6 C)] 98.4 F (36.9 C) (07/08 0622) Pulse Rate:  [96-106] 96 (07/08 0622) Resp:  [15-16] 16 (07/08 0622) BP: (151-169)/(90-100) 160/100 (07/08 0622) SpO2:  [94 %-97 %] 97 % (07/08 0622)  Intake/Output from previous day: 07/07 0701 - 07/08 0700 In: 3070 [P.O.:120; I.V.:2950] Out: 2850 [Urine:2850] Intake/Output this shift: No intake/output data recorded.  Recent Labs    12/16/17 0502 12/17/17 0503 12/18/17 0520  HGB 13.8 13.1 13.9   Recent Labs    12/17/17 0503 12/18/17 0520  WBC 11.1* 11.0*  RBC 4.08* 4.35  HCT 38.1* 40.4  PLT 192 187   Recent Labs    12/16/17 0502  NA 136  K 4.5  CL 101  CO2 26  BUN 19  CREATININE 1.13  GLUCOSE 191*  CALCIUM 8.8*   No results for input(s): LABPT, INR in the last 72 hours.  Alert and oriented x3. RRR, Lungs clear, BS x4. Left Calf soft and non tender. L knee dressing C/D/I. No DVT signs. No signs of infection or compartment syndrome. LLE grossly neurovascularly intact.   Anticipated LOS equal to or greater than 2 midnights due to - Age 33 and older with one or more of the following:  - Obesity  - Expected need for hospital services (PT, OT, Nursing) required for safe  discharge  - Anticipated need for postoperative skilled nursing care or inpatient rehab   OR   - Unanticipated findings during/Post Surgery: None  - Patient is a high risk of re-admission due to: None   Assessment/Plan: 3 Days Post-Op Procedure(s) (LRB): LEFT TOTAL KNEE ARTHROPLASTY (Left) CYSTOSCOPY WITH URETHRAL DILATATION (N/A) Advance diet Up with therapy Discharge home with home health   family updated Will Discuss to F/c with  urology. plan to d/c today.     Anders Hohmann L 12/18/2017, 7:42 AM

## 2017-12-19 ENCOUNTER — Ambulatory Visit: Payer: Self-pay

## 2017-12-19 NOTE — Telephone Encounter (Signed)
Pt. reports he has not had a BM since 12/14/17. Had Total knee surgery and was discharged yesterday. He had Miralax and suppository in the hospital. No results. Is taking Colace - encouraged to continue with Colace, especially with narcotic pain medications. Instructed to drink 6-8 glasses of water daily. Will try prune juice and Dulcolax. Instructed if he has not had a BM in 24 hours to call back. Verbalizes understanding.

## 2017-12-19 NOTE — Telephone Encounter (Signed)
Pt. Reports he has not had aBM since Reason for Disposition . Mild constipation  Answer Assessment - Initial Assessment Questions 1. STOOL PATTERN OR FREQUENCY: "How often do you pass bowel movements (BMs)?"  (Normal range: tid to q 3 days)  "When was the last BM passed?"       July 4 2. STRAINING: "Do you have to strain to have a BM?"      Yes 3. RECTAL PAIN: "Does your rectum hurt when the stool comes out?" If so, ask: "Do you have hemorrhoids? How bad is the pain?"  (Scale 1-10; or mild, moderate, severe)     No 4. STOOL COMPOSITION: "Are the stools hard?"      Unsure 5. BLOOD ON STOOLS: "Has there been any blood on the toilet tissue or on the surface of the BM?" If so, ask: "When was the last time?"      No 6. CHRONIC CONSTIPATION: "Is this a new problem for you?"  If no, ask: "How long have you had this problem?" (days, weeks, months)      Yes 7. CHANGES IN DIET: "Have there been any recent changes in your diet?"      No 8. MEDICATIONS: "Have you been taking any new medications?"     Colace, Miralax 9. LAXATIVES: "Have you been using any laxatives or enemas?"  If yes, ask "What, how often, and when was the last time?"     Suppository 10. CAUSE: "What do you think is causing the constipation?"        Unsure 11. OTHER SYMPTOMS: "Do you have any other symptoms?" (e.g., abdominal pain, fever, vomiting)       No 12. PREGNANCY: "Is there any chance you are pregnant?" "When was your last menstrual period?"       N/A  Protocols used: CONSTIPATION-A-AH

## 2017-12-20 DIAGNOSIS — G4733 Obstructive sleep apnea (adult) (pediatric): Secondary | ICD-10-CM | POA: Diagnosis not present

## 2017-12-20 DIAGNOSIS — Z471 Aftercare following joint replacement surgery: Secondary | ICD-10-CM | POA: Diagnosis not present

## 2017-12-22 DIAGNOSIS — Z471 Aftercare following joint replacement surgery: Secondary | ICD-10-CM | POA: Diagnosis not present

## 2017-12-22 DIAGNOSIS — G4733 Obstructive sleep apnea (adult) (pediatric): Secondary | ICD-10-CM | POA: Diagnosis not present

## 2017-12-25 DIAGNOSIS — G4733 Obstructive sleep apnea (adult) (pediatric): Secondary | ICD-10-CM | POA: Diagnosis not present

## 2017-12-25 DIAGNOSIS — Z471 Aftercare following joint replacement surgery: Secondary | ICD-10-CM | POA: Diagnosis not present

## 2017-12-26 DIAGNOSIS — G4733 Obstructive sleep apnea (adult) (pediatric): Secondary | ICD-10-CM | POA: Diagnosis not present

## 2017-12-26 DIAGNOSIS — Z471 Aftercare following joint replacement surgery: Secondary | ICD-10-CM | POA: Diagnosis not present

## 2017-12-27 DIAGNOSIS — Z96652 Presence of left artificial knee joint: Secondary | ICD-10-CM | POA: Diagnosis not present

## 2017-12-27 DIAGNOSIS — Z471 Aftercare following joint replacement surgery: Secondary | ICD-10-CM | POA: Diagnosis not present

## 2017-12-29 DIAGNOSIS — Z471 Aftercare following joint replacement surgery: Secondary | ICD-10-CM | POA: Diagnosis not present

## 2017-12-29 DIAGNOSIS — G4733 Obstructive sleep apnea (adult) (pediatric): Secondary | ICD-10-CM | POA: Diagnosis not present

## 2018-01-02 ENCOUNTER — Encounter: Payer: Self-pay | Admitting: Internal Medicine

## 2018-01-03 DIAGNOSIS — M25562 Pain in left knee: Secondary | ICD-10-CM | POA: Diagnosis not present

## 2018-01-05 DIAGNOSIS — M25562 Pain in left knee: Secondary | ICD-10-CM | POA: Diagnosis not present

## 2018-01-09 DIAGNOSIS — M25562 Pain in left knee: Secondary | ICD-10-CM | POA: Diagnosis not present

## 2018-01-10 DIAGNOSIS — N35011 Post-traumatic bulbous urethral stricture: Secondary | ICD-10-CM | POA: Diagnosis not present

## 2018-01-11 DIAGNOSIS — M25562 Pain in left knee: Secondary | ICD-10-CM | POA: Diagnosis not present

## 2018-01-16 DIAGNOSIS — M25562 Pain in left knee: Secondary | ICD-10-CM | POA: Diagnosis not present

## 2018-01-19 DIAGNOSIS — M25562 Pain in left knee: Secondary | ICD-10-CM | POA: Diagnosis not present

## 2018-01-23 DIAGNOSIS — M25562 Pain in left knee: Secondary | ICD-10-CM | POA: Diagnosis not present

## 2018-01-25 DIAGNOSIS — M25562 Pain in left knee: Secondary | ICD-10-CM | POA: Diagnosis not present

## 2018-01-30 DIAGNOSIS — M25562 Pain in left knee: Secondary | ICD-10-CM | POA: Diagnosis not present

## 2018-02-01 DIAGNOSIS — M25562 Pain in left knee: Secondary | ICD-10-CM | POA: Diagnosis not present

## 2018-02-21 DIAGNOSIS — H524 Presbyopia: Secondary | ICD-10-CM | POA: Diagnosis not present

## 2018-02-21 DIAGNOSIS — H52203 Unspecified astigmatism, bilateral: Secondary | ICD-10-CM | POA: Diagnosis not present

## 2018-02-21 DIAGNOSIS — H5203 Hypermetropia, bilateral: Secondary | ICD-10-CM | POA: Diagnosis not present

## 2018-03-07 DIAGNOSIS — Z471 Aftercare following joint replacement surgery: Secondary | ICD-10-CM | POA: Diagnosis not present

## 2018-03-07 DIAGNOSIS — Z96652 Presence of left artificial knee joint: Secondary | ICD-10-CM | POA: Diagnosis not present

## 2018-03-08 ENCOUNTER — Telehealth: Payer: Self-pay | Admitting: Internal Medicine

## 2018-03-08 NOTE — Telephone Encounter (Signed)
Patient has dropped off surgery clearance form.  I am placing in dr. Gwynn Burly box.

## 2018-03-09 NOTE — Telephone Encounter (Signed)
This is done.

## 2018-03-09 NOTE — Telephone Encounter (Signed)
Called pt no answer [facility] faxed to Mickel Baas @ [phone], also will leave copy for pick-up.Marland KitchenJohny Carroll

## 2018-04-17 ENCOUNTER — Telehealth: Payer: 59 | Admitting: Physician Assistant

## 2018-04-17 DIAGNOSIS — J22 Unspecified acute lower respiratory infection: Secondary | ICD-10-CM

## 2018-04-17 MED ORDER — AZITHROMYCIN 250 MG PO TABS: ORAL_TABLET | ORAL | 0 refills | Status: AC

## 2018-04-17 NOTE — Progress Notes (Signed)
We are sorry that you are not feeling well.  Here is how we plan to help!  Based on your presentation I believe you most likely have A cough due to bacteria.  When patients have a fever and a productive cough with a change in color or increased sputum production, we are concerned about bacterial bronchitis.  If left untreated it can progress to pneumonia.  If your symptoms do not improve with your treatment plan it is important that you contact your provider.   I have prescribed Azithromyin 250 mg: two tablets now and then one tablet daily for 4 additonal days      From your responses in the eVisit questionnaire you describe inflammation in the upper respiratory tract which is causing a significant cough.  This is commonly called Bronchitis and has four common causes:   Allergies Viral Infections Acid Reflux Bacterial Infection Allergies, viruses and acid reflux are treated by controlling symptoms or eliminating the cause. An example might be a cough caused by taking certain blood pressure medications. You stop the cough by changing the medication. Another example might be a cough caused by acid reflux. Controlling the reflux helps control the cough.  USE OF BRONCHODILATOR ("RESCUE") INHALERS: There is a risk from using your bronchodilator too frequently.  The risk is that over-reliance on a medication which only relaxes the muscles surrounding the breathing tubes can reduce the effectiveness of medications prescribed to reduce swelling and congestion of the tubes themselves.  Although you feel brief relief from the bronchodilator inhaler, your asthma may actually be worsening with the tubes becoming more swollen and filled with mucus.  This can delay other crucial treatments, such as oral steroid medications. If you need to use a bronchodilator inhaler daily, several times per day, you should discuss this with your provider.  There are probably better treatments that could be used to keep your asthma  under control.     HOME CARE Only take medications as instructed by your medical team. Complete the entire course of an antibiotic. Drink plenty of fluids and get plenty of rest. Avoid close contacts especially the very young and the elderly Cover your mouth if you cough or cough into your sleeve. Always remember to wash your hands A steam or ultrasonic humidifier can help congestion.   GET HELP RIGHT AWAY IF: You develop worsening fever. You become short of breath You cough up blood. Your symptoms persist after you have completed your treatment plan MAKE SURE YOU  Understand these instructions. Will watch your condition. Will get help right away if you are not doing well or get worse.  Your e-visit answers were reviewed by a board certified advanced clinical practitioner to complete your personal care plan.  Depending on the condition, your plan could have included both over the counter or prescription medications. If there is a problem please reply once you have received a response from your provider. Your safety is important to [place].  If you have drug allergies check your prescription carefully.    You can use MyChart to ask questions about today's visit, request a non-urgent call back, or ask for a work or school excuse for 24 hours related to this e-Visit. If it has been greater than 24 hours you will need to follow up with your provider, or enter a new e-Visit to address those concerns. You will get an e-mail in the next two days asking about your experience.  I hope that your e-visit has been valuable  and will speed your recovery. Thank you for using e-visits.

## 2018-04-18 ENCOUNTER — Ambulatory Visit: Payer: Self-pay | Admitting: Orthopedic Surgery

## 2018-04-23 DIAGNOSIS — Z5189 Encounter for other specified aftercare: Secondary | ICD-10-CM | POA: Insufficient documentation

## 2018-04-23 NOTE — H&P (Signed)
TOTAL KNEE ADMISSION H&P  Patient is being admitted for right total knee arthroplasty.  Subjective:  Chief Complaint:right knee pain.  HPI: Harry Carroll, 57 y.o. male, has a history of pain and functional disability in the right knee due to arthritis and has failed non-surgical conservative treatments for greater than 12 weeks to includecorticosteriod injections, flexibility and strengthening excercises, supervised PT with diminished ADL's post treatment and activity modification.  Onset of symptoms was gradual, starting 7 years ago with gradually worsening course since that time. The patient noted no past surgery on the right knee(s).  Patient currently rates pain in the right knee(s) at 8 out of 10 with activity. Patient has worsening of pain with activity and weight bearing, pain that interferes with activities of daily living, pain with passive range of motion and joint swelling.  Patient has evidence of subchondral sclerosis, periarticular osteophytes and joint space narrowing by imaging studies. There is no active infection.  Patient Active Problem List   Diagnosis Date Noted  . 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 Hyperglycemia 06/22/2017  . Numbness and tingling in right hand 01/04/2017  . Low testosterone 10/03/2016  . Obesity 03/28/2016  . Bilateral wrist pain 12/25/2015  . Acute diverticulitis 10/21/2015  . Numbness and tingling of both upper extremities while sleeping 08/27/2015  . Routine general medical examination at a health care facility 03/24/2015  . Bipolar I disorder, most recent episode (or current) unspecified 02/26/2015  . Rib contusion 11/13/2014  . Ganglion cyst of wrist 06/26/2014  . History of colonic polyps 04/02/2014  . Diverticulitis of colon (without mention of hemorrhage)(562.11) 01/17/2014  . Chest pain 09/04/2013  . Abdominal pain, left lower quadrant 02/18/2013  . Dizziness 03/29/2012  . Diverticulitis-post resection July 2013 11/27/2011  . OSA  (obstructive sleep apnea) 11/17/2011  . Fatigue 10/11/2011  . Personal history of colonic polyps 02/18/2011  . HYPERTRIGLYCERIDEMIA 02/20/2009  . BIPOLAR DISORDER UNSPECIFIED 11/29/2007  . INSOMNIA 11/29/2007  . LIVER FUNCTION TESTS, ABNORMAL, HX OF 11/29/2007  . DEPRESSION 07/07/2007  . Hyperlipidemia 04/16/2007   Past Medical History:  Diagnosis Date  . Arthritis   . Bipolar 1 disorder (HCC)    Dr Clovis Pu , pt denies  . Carotid stenosis   . Carpal tunnel syndrome    saw Dr Kristie Cowman , pt unaware  . Cervical spinal stenosis   . Colon polyps    adenomatous  . Diverticulitis   . Diverticulosis   . Heart murmur    pt denies  . History of hiatal hernia    resolved  . Hyperlipemia   . Insomnia    related to pain  . Normal cardiac stress test    CP 01-2009---Nl ECHO and stress test neg  . PVC (premature ventricular contraction)    history of PVC noted while in [employer]  . Restless leg   . Sleep apnea     Past Surgical History:  Procedure Laterality Date  . COLONOSCOPY WITH PROPOFOL N/A 04/02/2014   Procedure: COLONOSCOPY WITH PROPOFOL;  Surgeon: Inda Castle, MD;  Location: [facility];  Service: Endoscopy;  Laterality: N/A;  . CYSTOSCOPY WITH URETHRAL DILATATION  12/20/2011   Procedure: CYSTOSCOPY WITH URETHRAL DILATATION;  Surgeon: Earnstine Regal, MD;  Location: [facility];  Service: General;;  with foley insertion  . CYSTOSCOPY WITH URETHRAL DILATATION  12/20/2011   Procedure: CYSTOSCOPY WITH URETHRAL DILATATION;  Surgeon: Franchot Gallo, MD;  Location: [facility];  Service: Urology;;  . Consuela Mimes WITH URETHRAL DILATATION N/A 12/15/2017  Procedure: CYSTOSCOPY WITH URETHRAL DILATATION;  Surgeon: Irine Seal, MD;  Location: [facility];  Service: Urology;  Laterality: N/A;  . HOT HEMOSTASIS N/A 04/02/2014   Procedure: HOT HEMOSTASIS (ARGON PLASMA COAGULATION/BICAP);  Surgeon: Inda Castle, MD;  Location: [facility];  Service: Endoscopy;  Laterality: N/A;  . maxillofacial surgery     . PARTIAL COLECTOMY  12/20/2011   Procedure: PARTIAL COLECTOMY;  Surgeon: Earnstine Regal, MD;  Location: [facility];  Service: General;  Laterality: N/A;  Sigmoid colectomy  . right knee arthroscopy     2007 x2  . right shoulder surgery     X2  . SHOULDER SURGERY     LEFT  . TOTAL KNEE ARTHROPLASTY Left 12/15/2017   Procedure: LEFT TOTAL KNEE ARTHROPLASTY;  Surgeon: Sydnee Cabal, MD;  Location: [facility];  Service: Orthopedics;  Laterality: Left;  Adductor Block    No current facility-administered medications for this encounter.    Current Outpatient Medications  Medication Sig Dispense Refill Last Dose  . aspirin EC 325 MG tablet Take 1 tablet (325 mg total) by mouth 2 (two) times daily. 60 tablet 0   . methocarbamol (ROBAXIN) 500 MG tablet Take 1 tablet (500 mg total) by mouth every 8 (eight) hours as needed for muscle spasms. 50 tablet 2   . rosuvastatin (CRESTOR) 20 MG tablet Take 1 tablet (20 mg total) by mouth daily. (Patient taking differently: Take 20 mg by mouth every evening. ) 90 tablet 3 12/13/2017   Allergies  Allergen Reactions  . Ciprofloxacin Shortness Of Breath, Nausea And Vomiting, Nausea Only, Rash and Other (See Comments)    Marked mucous production  . Dilaudid [Hydromorphone Hcl] Shortness Of Breath and Nausea Only    Patient developed rash, excessive mucous production and chest pain - tolerates morphine  . Hydrocodone Itching    Social History   Tobacco Use  . Smoking status: Current Some Day Smoker    Types: Cigars  . Smokeless tobacco: Current User    Types: Snuff  . Tobacco comment: 1 per week  Substance Use Topics  . Alcohol use: Yes    Comment:  a couple beers a few times a week     Family History  Problem Relation Age of Onset  . Prostate cancer Paternal Grandfather   . Cancer Paternal Aunt        lymph cancer  . Alcohol abuse Father   . Alcohol abuse Sister   . Colon cancer Neg Hx   . CAD Neg Hx   . Pancreatic cancer Neg Hx   . Rectal cancer Neg  Hx   . Stomach cancer Neg Hx   . Drug abuse Neg Hx   . Anxiety disorder Neg Hx   . Bipolar disorder Neg Hx   . Depression Neg Hx      Review of Systems  Constitutional: Negative.   HENT: Negative.   Eyes: Negative.   Respiratory: Negative.   Cardiovascular: Negative.   Gastrointestinal: Negative.   Genitourinary: Negative.   Musculoskeletal: Positive for joint pain.  Skin: Negative.   Neurological: Negative.   Endo/Heme/Allergies: Negative.   Psychiatric/Behavioral: Negative.     Objective:  Physical Exam  Constitutional: He is oriented to person, place, and time. He appears well-developed.  HENT:  Head: Normocephalic.  Eyes: EOM are normal.  Neck: Normal range of motion.  Cardiovascular: Normal rate.  Respiratory: Effort normal.  GI: Soft.  Genitourinary:  Genitourinary Comments: deferred  Musculoskeletal:  Limited ROM and good strength. Calf  soft and non tender.  Neurological: He is alert and oriented to person, place, and time.  Skin: Skin is warm and dry.  Psychiatric: His behavior is normal.    Vital signs in last 24 hours: BP: ()/()  Arterial Line BP: ()/()   Labs:   Estimated body mass index is 38.69 kg/m as calculated from the following:   Height as of 12/15/17: 5\' 9"  (1.753 m).   Weight as of 12/15/17: 118.8 kg.   Imaging Review Plain radiographs demonstrate moderate degenerative joint disease of the right knee(s). The overall alignment ismild varus. The bone quality appears to be good for age and reported activity level.   Preoperative templating of the joint replacement has been completed, documented, and submitted to the Operating Room personnel in order to optimize intra-operative equipment management.   Anticipated LOS equal to or greater than 2 midnights due to - Age 60 and older with one or more of the following:  - Obesity  - Expected need for hospital services (PT, OT, Nursing) required for safe  discharge  - Anticipated need for  postoperative skilled nursing care or inpatient rehab  - Active co-morbidities: None OR   - Unanticipated findings during/Post Surgery: None  - Patient is a high risk of re-admission due to: None     Assessment/Plan:  End stage arthritis, right knee   The patient history, physical examination, clinical judgment of the provider and imaging studies are consistent with end stage degenerative joint disease of the right knee(s) and total knee arthroplasty is deemed medically necessary. The treatment options including medical management, injection therapy arthroscopy and arthroplasty were discussed at length. The risks and benefits of total knee arthroplasty were presented and reviewed. The risks due to aseptic loosening, infection, stiffness, patella tracking problems, thromboembolic complications and other imponderables were discussed. The patient acknowledged the explanation, agreed to proceed with the plan and consent was signed. Patient is being admitted for inpatient treatment for surgery, pain control, PT, OT, prophylactic antibiotics, VTE prophylaxis, progressive ambulation and ADL's and discharge planning. The patient is planning to be discharged home with home health services.  Will use IV tranexamic acid. Contraindications and adverse affects of Tranexamic acid discussed in detail. Patient denies any of these at this time and understands the risks and benefits.

## 2018-05-03 ENCOUNTER — Other Ambulatory Visit (HOSPITAL_COMMUNITY): Payer: Self-pay | Admitting: *Deleted

## 2018-05-03 NOTE — Patient Instructions (Addendum)
Harry Carroll  05/03/2018   Your procedure is scheduled on: 05-18-18  Report to [facility]  Entrance  Report to admitting at 730 AM  bring cpap mask and tubing  Call this number if you have problems the morning of surgery [phone]   Remember: Do not eat food or drink liquids :After Midnight. BRUSH YOUR TEETH MORNING OF SURGERY AND RINSE YOUR MOUTH OUT, NO CHEWING GUM CANDY OR MINTS.     Take these medicines the morning of surgery with A SIP OF WATER: es tylenol if needed                               You may not have any metal on your body including hair pins and              piercings  Do not wear jewelry, make-up, lotions, powders or perfumes, deodorant             Do not wear nail polish.  Do not shave  48 hours prior to surgery.              Men may shave face and neck.   Do not bring valuables to the hospital. Harry Carroll.  Contacts, dentures or bridgework may not be worn into surgery.  Leave suitcase in the car. After surgery it may be brought to your room.                  Please read over the following fact sheets you were given: _____________________________________________________________________             [facility] - Preparing for Surgery Before surgery, you can play an important role.  Because skin is not sterile, your skin needs to be as free of germs as possible.  You can reduce the number of germs on your skin by washing with CHG (chlorahexidine gluconate) soap before surgery.  CHG is an antiseptic cleaner which kills germs and bonds with the skin to continue killing germs even after washing. Please DO NOT use if you have an allergy to CHG or antibacterial soaps.  If your skin becomes reddened/irritated stop using the CHG and inform your nurse when you arrive at Short Stay. Do not shave (including legs and underarms) for at least 48 hours prior to the first CHG shower.  You may  shave your face/neck. Please follow these instructions carefully:  1.  Shower with CHG Soap the night before surgery and the  morning of Surgery.  2.  If you choose to wash your hair, wash your hair first as usual with your  normal  shampoo.  3.  After you shampoo, rinse your hair and body thoroughly to remove the  shampoo.                           4.  Use CHG as you would any other liquid soap.  You can apply chg directly  to the skin and wash                       Gently with a scrungie or clean washcloth.  5.  Apply the CHG Soap to your  body ONLY FROM THE NECK DOWN.   Do not use on face/ open                           Wound or open sores. Avoid contact with eyes, ears mouth and genitals (private parts).                       Wash face,  Genitals (private parts) with your normal soap.             6.  Wash thoroughly, paying special attention to the area where your surgery  will be performed.  7.  Thoroughly rinse your body with warm water from the neck down.  8.  DO NOT shower/wash with your normal soap after using and rinsing off  the CHG Soap.                9.  Pat yourself dry with a clean towel.            10.  Wear clean pajamas.            11.  Place clean sheets on your bed the night of your first shower and do not  sleep with pets. Day of Surgery : Do not apply any lotions/deodorants the morning of surgery.  Please wear clean clothes to the hospital/surgery center.  FAILURE TO FOLLOW THESE INSTRUCTIONS MAY RESULT IN THE CANCELLATION OF YOUR SURGERY PATIENT SIGNATURE_________________________________  NURSE SIGNATURE__________________________________  ________________________________________________________________________   Harry Carroll  An incentive spirometer is a tool that can help keep your lungs clear and active. This tool measures how well you are filling your lungs with each breath. Taking long deep breaths may help reverse or decrease the chance of developing  breathing (pulmonary) problems (especially infection) following:  A long period of time when you are unable to move or be active. BEFORE THE PROCEDURE   If the spirometer includes an indicator to show your best effort, your nurse or respiratory therapist will set it to a desired goal.  If possible, sit up straight or lean slightly forward. Try not to slouch.  Hold the incentive spirometer in an upright position. INSTRUCTIONS FOR USE  1. Sit on the edge of your bed if possible, or sit up as far as you can in bed or on a chair. 2. Hold the incentive spirometer in an upright position. 3. Breathe out normally. 4. Place the mouthpiece in your mouth and seal your lips tightly around it. 5. Breathe in slowly and as deeply as possible, raising the piston or the ball toward the top of the column. 6. Hold your breath for 3-5 seconds or for as long as possible. Allow the piston or ball to fall to the bottom of the column. 7. Remove the mouthpiece from your mouth and breathe out normally. 8. Rest for a few seconds and repeat Steps 1 through 7 at least 10 times every 1-2 hours when you are awake. Take your time and take a few normal breaths between deep breaths. 9. The spirometer may include an indicator to show your best effort. Use the indicator as a goal to work toward during each repetition. 10. After each set of 10 deep breaths, practice coughing to be sure your lungs are clear. If you have an incision (the cut made at the time of surgery), support your incision when coughing by placing a pillow or rolled up towels firmly against it.  Once you are able to get out of bed, walk around indoors and cough well. You may stop using the incentive spirometer when instructed by your caregiver.  RISKS AND COMPLICATIONS  Take your time so you do not get dizzy or light-headed.  If you are in pain, you may need to take or ask for pain medication before doing incentive spirometry. It is harder to take a deep  breath if you are having pain. AFTER USE  Rest and breathe slowly and easily.  It can be helpful to keep track of a log of your progress. Your caregiver can provide you with a simple table to help with this. If you are using the spirometer at home, follow these instructions: [place] IF:   You are having difficultly using the spirometer.  You have trouble using the spirometer as often as instructed.  Your pain medication is not giving enough relief while using the spirometer.  You develop fever of 100.5 F (38.1 C) or higher. SEEK IMMEDIATE MEDICAL CARE IF:   You cough up bloody sputum that had not been present before.  You develop fever of 102 F (38.9 C) or greater.  You develop worsening pain at or near the incision site. MAKE SURE YOU:   Understand these instructions.  Will watch your condition.  Will get help right away if you are not doing well or get worse. Document Released: 10/10/2006 Document Revised: 08/22/2011 Document Reviewed: 12/11/2006 ExitCare Patient Information 2014 Exit[employer], [place].   ________________________________________________________________________  WHAT IS A BLOOD TRANSFUSION? Blood Transfusion Information  A transfusion is the replacement of blood or some of its parts. Blood is made up of multiple cells which provide different functions.  Red blood cells carry oxygen and are used for blood loss replacement.  White blood cells fight against infection.  Platelets control bleeding.  Plasma helps clot blood.  Other blood products are available for specialized needs, such as hemophilia or other clotting disorders. BEFORE THE TRANSFUSION  Who gives blood for transfusions?   Healthy volunteers who are fully evaluated to make sure their blood is safe. This is blood bank blood. Transfusion therapy is the safest it has ever been in the practice of medicine. Before blood is taken from a donor, a complete history is taken to make sure  that person has no history of diseases nor engages in risky social behavior (examples are intravenous drug use or sexual activity with multiple partners). The donor's travel history is screened to minimize risk of transmitting infections, such as malaria. The donated blood is tested for signs of infectious diseases, such as HIV and hepatitis. The blood is then tested to be sure it is compatible with you in order to minimize the chance of a transfusion reaction. If you or a relative donates blood, this is often done in anticipation of surgery and is not appropriate for emergency situations. It takes many days to process the donated blood. RISKS AND COMPLICATIONS Although transfusion therapy is very safe and saves many lives, the main dangers of transfusion include:   Getting an infectious disease.  Developing a transfusion reaction. This is an allergic reaction to something in the blood you were given. Every precaution is taken to prevent this. The decision to have a blood transfusion has been considered carefully by your caregiver before blood is given. Blood is not given unless the benefits outweigh the risks. AFTER THE TRANSFUSION  Right after receiving a blood transfusion, you will usually feel much better and more energetic.  This is especially true if your red blood cells have gotten low (anemic). The transfusion raises the level of the red blood cells which carry oxygen, and this usually causes an energy increase.  The nurse administering the transfusion will monitor you carefully for complications. HOME CARE INSTRUCTIONS  No special instructions are needed after a transfusion. You may find your energy is better. Speak with your caregiver about any limitations on activity for underlying diseases you may have. SEEK MEDICAL CARE IF:   Your condition is not improving after your transfusion.  You develop redness or irritation at the intravenous (IV) site. SEEK IMMEDIATE MEDICAL CARE IF:  Any of  the following symptoms occur over the next 12 hours:  Shaking chills.  You have a temperature by mouth above 102 F (38.9 C), not controlled by medicine.  Chest, back, or muscle pain.  People around you feel you are not acting correctly or are confused.  Shortness of breath or difficulty breathing.  Dizziness and fainting.  You get a rash or develop hives.  You have a decrease in urine output.  Your urine turns a dark color or changes to pink, red, or brown. Any of the following symptoms occur over the next 10 days:  You have a temperature by mouth above 102 F (38.9 C), not controlled by medicine.  Shortness of breath.  Weakness after normal activity.  The white part of the eye turns yellow (jaundice).  You have a decrease in the amount of urine or are urinating less often.  Your urine turns a dark color or changes to pink, red, or brown. Document Released: 05/27/2000 Document Revised: 08/22/2011 Document Reviewed: 01/14/2008 [facility] Patient Information 2014 [place], [place].  _______________________________________________________________________

## 2018-05-03 NOTE — Progress Notes (Signed)
LOV DR CRENSHAW 05-09-18 Epic STRESS ECHO 05-31-17 Epic VAS US CAROTID12-17-18 Epic MEDICAL CLEARANCE NOTE DR Cathlean Cower 03-04-18 EPIC

## 2018-05-07 ENCOUNTER — Encounter (HOSPITAL_COMMUNITY): Payer: Self-pay

## 2018-05-07 ENCOUNTER — Other Ambulatory Visit: Payer: Self-pay

## 2018-05-07 ENCOUNTER — Encounter (HOSPITAL_COMMUNITY)
Admission: RE | Admit: 2018-05-07 | Discharge: 2018-05-07 | Disposition: A | Discharge status: Home / Self Care | Payer: 59 | Source: Ambulatory Visit | Attending: Specialist | Admitting: Specialist

## 2018-05-07 DIAGNOSIS — R9431 Abnormal electrocardiogram [ECG] [EKG]: Secondary | ICD-10-CM | POA: Insufficient documentation

## 2018-05-07 DIAGNOSIS — Z01818 Encounter for other preprocedural examination: Secondary | ICD-10-CM | POA: Insufficient documentation

## 2018-05-07 HISTORY — DX: Anesthesia of skin: R20.0

## 2018-05-07 HISTORY — DX: Adverse effect of unspecified anesthetic, initial encounter: T41.45XA

## 2018-05-07 HISTORY — DX: Other complications of anesthesia, initial encounter: T88.59XA

## 2018-05-07 LAB — URINALYSIS, ROUTINE W REFLEX MICROSCOPIC
Bilirubin Urine: NEGATIVE
GLUCOSE, UA: NEGATIVE mg/dL
Hgb urine dipstick: NEGATIVE
KETONES UR: NEGATIVE mg/dL
LEUKOCYTES UA: NEGATIVE
NITRITE: NEGATIVE
PH: 6 (ref 5.0–8.0)
PROTEIN: NEGATIVE mg/dL
Specific Gravity, Urine: 1.021 (ref 1.005–1.030)

## 2018-05-07 LAB — BASIC METABOLIC PANEL
ANION GAP: 9 (ref 5–15)
BUN: 17 mg/dL (ref 6–20)
CHLORIDE: 103 mmol/L (ref 98–111)
CO2: 24 mmol/L (ref 22–32)
Calcium: 9.8 mg/dL (ref 8.9–10.3)
Creatinine, Ser: 0.97 mg/dL (ref 0.61–1.24)
GFR calc Af Amer: >60 mL/min (ref >60)
GFR calc non Af Amer: >60 mL/min (ref >60)
GLUCOSE: 109 mg/dL — AB (ref 70–99)
POTASSIUM: 4.5 mmol/L (ref 3.5–5.1)
Sodium: 136 mmol/L (ref 135–145)

## 2018-05-07 LAB — CBC
HEMATOCRIT: 44.7 % (ref 39.0–52.0)
HEMOGLOBIN: 15.6 g/dL (ref 13.0–17.0)
MCH: 31.4 pg (ref 26.0–34.0)
MCHC: 34.9 g/dL (ref 30.0–36.0)
MCV: 89.9 fL (ref 80.0–100.0)
Platelets: 211 10*3/uL (ref 150–400)
RBC: 4.97 MIL/uL (ref 4.22–5.81)
RDW: 13.1 % (ref 11.5–15.5)
WBC: 5.1 10*3/uL (ref 4.0–10.5)
nRBC: 0 % (ref 0.0–0.2)

## 2018-05-07 LAB — TYPE AND SCREEN
ABO/RH(D): A POS
Antibody Screen: NEGATIVE

## 2018-05-07 LAB — SURGICAL PCR SCREEN
MRSA, PCR: NEGATIVE
STAPHYLOCOCCUS AUREUS: NEGATIVE

## 2018-05-07 LAB — APTT: aPTT: 26 seconds (ref 24–36)

## 2018-05-07 LAB — PROTIME-INR
INR: 0.91
Prothrombin Time: 12.2 seconds (ref 11.4–15.2)

## 2018-05-18 ENCOUNTER — Other Ambulatory Visit: Payer: Self-pay

## 2018-05-18 ENCOUNTER — Inpatient Hospital Stay (HOSPITAL_COMMUNITY)
Admission: RE | Admit: 2018-05-18 | Discharge: 2018-05-20 | DRG: 470 | Disposition: A | Discharge status: Home Health | Payer: 59 | Attending: Specialist | Admitting: Specialist

## 2018-05-18 ENCOUNTER — Inpatient Hospital Stay (HOSPITAL_COMMUNITY): Payer: 59 | Admitting: Anesthesiology

## 2018-05-18 ENCOUNTER — Encounter (HOSPITAL_COMMUNITY): Payer: Self-pay

## 2018-05-18 ENCOUNTER — Encounter (HOSPITAL_COMMUNITY)
Admission: RE | Disposition: A | Discharge status: Home Health | Payer: Self-pay | Source: Home / Self Care | Attending: Specialist

## 2018-05-18 DIAGNOSIS — E785 Hyperlipidemia, unspecified: Secondary | ICD-10-CM | POA: Diagnosis present

## 2018-05-18 DIAGNOSIS — Z811 Family history of alcohol abuse and dependence: Secondary | ICD-10-CM | POA: Diagnosis not present

## 2018-05-18 DIAGNOSIS — F17290 Nicotine dependence, other tobacco product, uncomplicated: Secondary | ICD-10-CM | POA: Diagnosis present

## 2018-05-18 DIAGNOSIS — N35912 Unspecified bulbous urethral stricture, male: Secondary | ICD-10-CM | POA: Diagnosis present

## 2018-05-18 DIAGNOSIS — Z96651 Presence of right artificial knee joint: Secondary | ICD-10-CM

## 2018-05-18 DIAGNOSIS — Z6836 Body mass index (BMI) 36.0-36.9, adult: Secondary | ICD-10-CM

## 2018-05-18 DIAGNOSIS — Z885 Allergy status to narcotic agent status: Secondary | ICD-10-CM | POA: Diagnosis not present

## 2018-05-18 DIAGNOSIS — Z9049 Acquired absence of other specified parts of digestive tract: Secondary | ICD-10-CM

## 2018-05-18 DIAGNOSIS — E669 Obesity, unspecified: Secondary | ICD-10-CM | POA: Diagnosis present

## 2018-05-18 DIAGNOSIS — Z8042 Family history of malignant neoplasm of prostate: Secondary | ICD-10-CM | POA: Diagnosis not present

## 2018-05-18 DIAGNOSIS — Z96652 Presence of left artificial knee joint: Secondary | ICD-10-CM | POA: Diagnosis present

## 2018-05-18 DIAGNOSIS — Z79899 Other long term (current) drug therapy: Secondary | ICD-10-CM

## 2018-05-18 DIAGNOSIS — Z96659 Presence of unspecified artificial knee joint: Secondary | ICD-10-CM

## 2018-05-18 DIAGNOSIS — M1711 Unilateral primary osteoarthritis, right knee: Secondary | ICD-10-CM | POA: Diagnosis not present

## 2018-05-18 DIAGNOSIS — Z7982 Long term (current) use of aspirin: Secondary | ICD-10-CM

## 2018-05-18 DIAGNOSIS — Z881 Allergy status to other antibiotic agents status: Secondary | ICD-10-CM | POA: Diagnosis not present

## 2018-05-18 DIAGNOSIS — G8918 Other acute postprocedural pain: Secondary | ICD-10-CM | POA: Diagnosis not present

## 2018-05-18 DIAGNOSIS — E781 Pure hyperglyceridemia: Secondary | ICD-10-CM | POA: Diagnosis not present

## 2018-05-18 DIAGNOSIS — G4733 Obstructive sleep apnea (adult) (pediatric): Secondary | ICD-10-CM | POA: Diagnosis present

## 2018-05-18 DIAGNOSIS — N35011 Post-traumatic bulbous urethral stricture: Secondary | ICD-10-CM | POA: Diagnosis not present

## 2018-05-18 HISTORY — PX: TOTAL KNEE ARTHROPLASTY: SHX125

## 2018-05-18 SURGERY — ARTHROPLASTY, KNEE, TOTAL
Anesthesia: Spinal | Site: Urethra | Laterality: Right

## 2018-05-18 MED ORDER — CEFAZOLIN SODIUM-DEXTROSE 2-4 GM/100ML-% IV SOLN
2.0000 g | Freq: Four times a day (QID) | INTRAVENOUS | Status: AC
  Administered 2018-05-18 (×2): 2 g via INTRAVENOUS
  Filled 2018-05-18 (×2): qty 100

## 2018-05-18 MED ORDER — DEXAMETHASONE SODIUM PHOSPHATE 10 MG/ML IJ SOLN: 10.0000 mg | Freq: Once | INTRAMUSCULAR | Status: DC

## 2018-05-18 MED ORDER — TRANEXAMIC ACID-NACL 1000-0.7 MG/100ML-% IV SOLN
1000.0000 mg | INTRAVENOUS | Status: AC
  Administered 2018-05-18: 1000 mg via INTRAVENOUS

## 2018-05-18 MED ORDER — PROPOFOL 10 MG/ML IV BOLUS
INTRAVENOUS | Status: AC
  Filled 2018-05-18: qty 20

## 2018-05-18 MED ORDER — LACTATED RINGERS IV SOLN
INTRAVENOUS | Status: AC
  Administered 2018-05-18: 13:00:00 via INTRAVENOUS
  Administered 2018-05-18: 1000 mL via INTRAVENOUS

## 2018-05-18 MED ORDER — ONDANSETRON HCL 4 MG/2ML IJ SOLN
INTRAMUSCULAR | Status: DC | PRN
  Administered 2018-05-18: 4 mg via INTRAVENOUS

## 2018-05-18 MED ORDER — 0.9 % SODIUM CHLORIDE (POUR BTL) OPTIME
TOPICAL | Status: DC | PRN
  Administered 2018-05-18: 1000 mL

## 2018-05-18 MED ORDER — FENTANYL CITRATE (PF) 100 MCG/2ML IJ SOLN
50.0000 ug | INTRAMUSCULAR | Status: DC
  Administered 2018-05-18 (×2): 50 ug via INTRAVENOUS

## 2018-05-18 MED ORDER — MORPHINE SULFATE (PF) 4 MG/ML IV SOLN: 2.0000 mg | INTRAVENOUS | Status: DC | PRN

## 2018-05-18 MED ORDER — STERILE WATER FOR IRRIGATION IR SOLN
Status: DC | PRN
  Administered 2018-05-18: 2000 mL

## 2018-05-18 MED ORDER — CHLORHEXIDINE GLUCONATE 4 % EX LIQD: 60.0000 mL | Freq: Once | CUTANEOUS | Status: DC

## 2018-05-18 MED ORDER — METOCLOPRAMIDE HCL 5 MG/ML IJ SOLN: 5.0000 mg | Freq: Three times a day (TID) | INTRAMUSCULAR | Status: DC | PRN

## 2018-05-18 MED ORDER — SODIUM CHLORIDE (PF) 0.9 % IJ SOLN
INTRAMUSCULAR | Status: AC
  Filled 2018-05-18: qty 50

## 2018-05-18 MED ORDER — PROPOFOL 500 MG/50ML IV EMUL
INTRAVENOUS | Status: DC | PRN
  Administered 2018-05-18: 40 ug/kg/min via INTRAVENOUS

## 2018-05-18 MED ORDER — DEXAMETHASONE SODIUM PHOSPHATE 10 MG/ML IJ SOLN
10.0000 mg | Freq: Once | INTRAMUSCULAR | Status: AC
  Administered 2018-05-18: 8 mg via INTRAVENOUS

## 2018-05-18 MED ORDER — FENTANYL CITRATE (PF) 100 MCG/2ML IJ SOLN
INTRAMUSCULAR | Status: AC
  Filled 2018-05-18: qty 2

## 2018-05-18 MED ORDER — TRANEXAMIC ACID-NACL 1000-0.7 MG/100ML-% IV SOLN
INTRAVENOUS | Status: AC
  Filled 2018-05-18: qty 100

## 2018-05-18 MED ORDER — OXYCODONE HCL 5 MG PO TABS
10.0000 mg | ORAL_TABLET | ORAL | Status: DC | PRN
  Administered 2018-05-19 – 2018-05-20 (×2): 10 mg via ORAL
  Filled 2018-05-18 (×2): qty 2

## 2018-05-18 MED ORDER — METHOCARBAMOL 1000 MG/10ML IJ SOLN
500.0000 mg | Freq: Four times a day (QID) | INTRAVENOUS | Status: DC | PRN
  Filled 2018-05-18: qty 5

## 2018-05-18 MED ORDER — PHENYLEPHRINE 40 MCG/ML (10ML) SYRINGE FOR IV PUSH (FOR BLOOD PRESSURE SUPPORT)
PREFILLED_SYRINGE | INTRAVENOUS | Status: AC
  Filled 2018-05-18: qty 10

## 2018-05-18 MED ORDER — SODIUM CHLORIDE 0.9 % IV SOLN
INTRAVENOUS | Status: DC
  Administered 2018-05-18: 17:00:00 via INTRAVENOUS

## 2018-05-18 MED ORDER — METHOCARBAMOL 500 MG PO TABS: 500.0000 mg | ORAL_TABLET | Freq: Three times a day (TID) | ORAL | 0 refills | Status: DC | PRN

## 2018-05-18 MED ORDER — MIDAZOLAM HCL 2 MG/2ML IJ SOLN
INTRAMUSCULAR | Status: AC
  Administered 2018-05-18: 1 mg via INTRAVENOUS
  Filled 2018-05-18: qty 2

## 2018-05-18 MED ORDER — BUPIVACAINE IN DEXTROSE 0.75-8.25 % IT SOLN
INTRATHECAL | Status: DC | PRN
  Administered 2018-05-18: 1.7 mL via INTRATHECAL

## 2018-05-18 MED ORDER — FENTANYL CITRATE (PF) 100 MCG/2ML IJ SOLN
25.0000 ug | INTRAMUSCULAR | Status: DC | PRN
  Administered 2018-05-18: 50 ug via INTRAVENOUS

## 2018-05-18 MED ORDER — ONDANSETRON HCL 4 MG/2ML IJ SOLN
INTRAMUSCULAR | Status: AC
  Filled 2018-05-18: qty 2

## 2018-05-18 MED ORDER — BUPIVACAINE-EPINEPHRINE 0.25% -1:200000 IJ SOLN
INTRAMUSCULAR | Status: DC | PRN
  Administered 2018-05-18: 30 mL

## 2018-05-18 MED ORDER — CEFAZOLIN SODIUM-DEXTROSE 2-4 GM/100ML-% IV SOLN
INTRAVENOUS | Status: AC
  Filled 2018-05-18: qty 100

## 2018-05-18 MED ORDER — DEXAMETHASONE SODIUM PHOSPHATE 10 MG/ML IJ SOLN
INTRAMUSCULAR | Status: AC
  Filled 2018-05-18: qty 1

## 2018-05-18 MED ORDER — FERROUS SULFATE 325 (65 FE) MG PO TABS
325.0000 mg | ORAL_TABLET | Freq: Three times a day (TID) | ORAL | Status: DC
  Administered 2018-05-19 – 2018-05-20 (×4): 325 mg via ORAL
  Filled 2018-05-18 (×4): qty 1

## 2018-05-18 MED ORDER — PHENOL 1.4 % MT LIQD: 1.0000 | OROMUCOSAL | Status: DC | PRN

## 2018-05-18 MED ORDER — CEFAZOLIN SODIUM-DEXTROSE 2-4 GM/100ML-% IV SOLN
2.0000 g | INTRAVENOUS | Status: AC
  Administered 2018-05-18: 2 g via INTRAVENOUS

## 2018-05-18 MED ORDER — ROSUVASTATIN CALCIUM 20 MG PO TABS
20.0000 mg | ORAL_TABLET | Freq: Every day | ORAL | Status: DC
  Administered 2018-05-19 – 2018-05-20 (×2): 20 mg via ORAL
  Filled 2018-05-18 (×2): qty 1

## 2018-05-18 MED ORDER — KETOROLAC TROMETHAMINE 30 MG/ML IJ SOLN
INTRAMUSCULAR | Status: DC | PRN
  Administered 2018-05-18: 30 mg

## 2018-05-18 MED ORDER — ONDANSETRON HCL 4 MG PO TABS: 4.0000 mg | ORAL_TABLET | Freq: Four times a day (QID) | ORAL | Status: DC | PRN

## 2018-05-18 MED ORDER — POLYETHYLENE GLYCOL 3350 17 G PO PACK
17.0000 g | PACK | Freq: Every day | ORAL | Status: DC | PRN
  Administered 2018-05-19: 17 g via ORAL
  Filled 2018-05-18: qty 1

## 2018-05-18 MED ORDER — FENTANYL CITRATE (PF) 100 MCG/2ML IJ SOLN
INTRAMUSCULAR | Status: AC
  Administered 2018-05-18: 50 ug via INTRAVENOUS
  Filled 2018-05-18: qty 2

## 2018-05-18 MED ORDER — PHENYLEPHRINE 40 MCG/ML (10ML) SYRINGE FOR IV PUSH (FOR BLOOD PRESSURE SUPPORT)
PREFILLED_SYRINGE | INTRAVENOUS | Status: DC | PRN
  Administered 2018-05-18 (×2): 80 ug via INTRAVENOUS

## 2018-05-18 MED ORDER — MENTHOL 3 MG MT LOZG: 1.0000 | LOZENGE | OROMUCOSAL | Status: DC | PRN

## 2018-05-18 MED ORDER — METHOCARBAMOL 500 MG PO TABS
500.0000 mg | ORAL_TABLET | Freq: Four times a day (QID) | ORAL | Status: DC | PRN
  Administered 2018-05-18 – 2018-05-20 (×3): 500 mg via ORAL
  Filled 2018-05-18 (×3): qty 1

## 2018-05-18 MED ORDER — BISACODYL 5 MG PO TBEC: 5.0000 mg | DELAYED_RELEASE_TABLET | Freq: Every day | ORAL | Status: DC | PRN

## 2018-05-18 MED ORDER — ACETAMINOPHEN 10 MG/ML IV SOLN: 1000.0000 mg | Freq: Once | INTRAVENOUS | Status: DC | PRN

## 2018-05-18 MED ORDER — ACETAMINOPHEN 325 MG PO TABS: 325.0000 mg | ORAL_TABLET | Freq: Four times a day (QID) | ORAL | Status: DC | PRN

## 2018-05-18 MED ORDER — OXYCODONE HCL 5 MG PO TABS: 5.0000 mg | ORAL_TABLET | ORAL | 0 refills | Status: AC | PRN

## 2018-05-18 MED ORDER — PROPOFOL 10 MG/ML IV BOLUS
INTRAVENOUS | Status: AC
  Filled 2018-05-18: qty 40

## 2018-05-18 MED ORDER — ENOXAPARIN SODIUM 30 MG/0.3ML ~~LOC~~ SOLN
30.0000 mg | Freq: Two times a day (BID) | SUBCUTANEOUS | Status: DC
  Administered 2018-05-19 – 2018-05-20 (×3): 30 mg via SUBCUTANEOUS
  Filled 2018-05-18 (×3): qty 0.3

## 2018-05-18 MED ORDER — ALUM & MAG HYDROXIDE-SIMETH 200-200-20 MG/5ML PO SUSP: 30.0000 mL | ORAL | Status: DC | PRN

## 2018-05-18 MED ORDER — DOCUSATE SODIUM 100 MG PO CAPS
100.0000 mg | ORAL_CAPSULE | Freq: Two times a day (BID) | ORAL | Status: DC
  Administered 2018-05-18 – 2018-05-20 (×4): 100 mg via ORAL
  Filled 2018-05-18 (×4): qty 1

## 2018-05-18 MED ORDER — MIDAZOLAM HCL 2 MG/2ML IJ SOLN
0.5000 mg | INTRAMUSCULAR | Status: DC
  Administered 2018-05-18: 1 mg via INTRAVENOUS

## 2018-05-18 MED ORDER — ONDANSETRON HCL 4 MG/2ML IJ SOLN: 4.0000 mg | Freq: Four times a day (QID) | INTRAMUSCULAR | Status: DC | PRN

## 2018-05-18 MED ORDER — BUPIVACAINE-EPINEPHRINE (PF) 0.5% -1:200000 IJ SOLN
INTRAMUSCULAR | Status: DC | PRN
  Administered 2018-05-18: 20 mL via PERINEURAL

## 2018-05-18 MED ORDER — ACETAMINOPHEN 500 MG PO TABS
1000.0000 mg | ORAL_TABLET | Freq: Four times a day (QID) | ORAL | Status: AC
  Administered 2018-05-18 – 2018-05-19 (×4): 1000 mg via ORAL
  Filled 2018-05-18 (×3): qty 2

## 2018-05-18 MED ORDER — PROMETHAZINE HCL 25 MG/ML IJ SOLN: 6.2500 mg | INTRAMUSCULAR | Status: DC | PRN

## 2018-05-18 MED ORDER — KETOROLAC TROMETHAMINE 30 MG/ML IJ SOLN
INTRAMUSCULAR | Status: AC
  Filled 2018-05-18: qty 1

## 2018-05-18 MED ORDER — ASPIRIN EC 325 MG PO TBEC: 325.0000 mg | DELAYED_RELEASE_TABLET | Freq: Two times a day (BID) | ORAL | 0 refills | Status: DC

## 2018-05-18 MED ORDER — SODIUM CHLORIDE 0.9 % IR SOLN
Status: DC | PRN
  Administered 2018-05-18: 1000 mL

## 2018-05-18 MED ORDER — MAGNESIUM CITRATE PO SOLN: 1.0000 | Freq: Once | ORAL | Status: DC | PRN

## 2018-05-18 MED ORDER — SODIUM CHLORIDE (PF) 0.9 % IJ SOLN
INTRAMUSCULAR | Status: DC | PRN
  Administered 2018-05-18: 30 mL

## 2018-05-18 MED ORDER — FENTANYL CITRATE (PF) 100 MCG/2ML IJ SOLN: 50.0000 ug | INTRAMUSCULAR | Status: DC

## 2018-05-18 MED ORDER — OXYCODONE HCL 5 MG PO TABS
5.0000 mg | ORAL_TABLET | ORAL | Status: DC | PRN
  Administered 2018-05-18 – 2018-05-19 (×3): 5 mg via ORAL
  Administered 2018-05-19 (×2): 10 mg via ORAL
  Administered 2018-05-19: 5 mg via ORAL
  Administered 2018-05-20: 10 mg via ORAL
  Filled 2018-05-18 (×3): qty 1
  Filled 2018-05-18 (×4): qty 2

## 2018-05-18 MED ORDER — DIPHENHYDRAMINE HCL 12.5 MG/5ML PO ELIX
12.5000 mg | ORAL_SOLUTION | ORAL | Status: DC | PRN
  Administered 2018-05-20: 12.5 mg via ORAL
  Filled 2018-05-18: qty 5

## 2018-05-18 MED ORDER — METOCLOPRAMIDE HCL 5 MG PO TABS: 5.0000 mg | ORAL_TABLET | Freq: Three times a day (TID) | ORAL | Status: DC | PRN

## 2018-05-18 MED ORDER — BUPIVACAINE-EPINEPHRINE (PF) 0.25% -1:200000 IJ SOLN
INTRAMUSCULAR | Status: AC
  Filled 2018-05-18: qty 30

## 2018-05-18 SURGICAL SUPPLY — 67 items
ADH SKN CLS APL DERMABOND .7 (GAUZE/BANDAGES/DRESSINGS) ×1
BAG DECANTER FOR FLEXI CONT (MISCELLANEOUS) IMPLANT
BAG SPEC THK2 15X12 ZIP CLS (MISCELLANEOUS) ×2
BAG ZIPLOCK 12X15 (MISCELLANEOUS) ×8 IMPLANT
BANDAGE ACE 4X5 VEL STRL LF (GAUZE/BANDAGES/DRESSINGS) ×4 IMPLANT
BANDAGE ACE 6X5 VEL STRL LF (GAUZE/BANDAGES/DRESSINGS) ×4 IMPLANT
BANDAGE ELASTIC 4 VELCRO ST LF (GAUZE/BANDAGES/DRESSINGS) ×4 IMPLANT
BANDAGE ELASTIC 6 VELCRO ST LF (GAUZE/BANDAGES/DRESSINGS) ×4 IMPLANT
BLADE SAG 18X100X1.27 (BLADE) ×4 IMPLANT
BLADE SAW SGTL 13.0X1.19X90.0M (BLADE) ×4 IMPLANT
BLADE SURG SZ10 CARB STEEL (BLADE) ×8 IMPLANT
BOWL SMART MIX CTS (DISPOSABLE) ×4 IMPLANT
CATH TIEMANN FOLEY 18FR 5CC (CATHETERS) ×4 IMPLANT
CEMENT HV SMART SET (Cement) ×8 IMPLANT
CEMENT TIBIA MBT SIZE 5 (Knees) ×2 IMPLANT
COVER SURGICAL LIGHT HANDLE (MISCELLANEOUS) ×4 IMPLANT
COVER WAND RF STERILE (DRAPES) ×4 IMPLANT
CUFF TOURN SGL QUICK 34 (TOURNIQUET CUFF) ×4
CUFF TRNQT CYL 34X4X40X1 (TOURNIQUET CUFF) ×2 IMPLANT
DECANTER SPIKE VIAL GLASS SM (MISCELLANEOUS) ×8 IMPLANT
DERMABOND ADVANCED (GAUZE/BANDAGES/DRESSINGS) ×2
DERMABOND ADVANCED .7 DNX12 (GAUZE/BANDAGES/DRESSINGS) ×2 IMPLANT
DRAPE U-SHAPE 47X51 STRL (DRAPES) ×4 IMPLANT
DRSG AQUACEL AG ADV 3.5X10 (GAUZE/BANDAGES/DRESSINGS) ×4 IMPLANT
DRSG TEGADERM 4X4.75 (GAUZE/BANDAGES/DRESSINGS) ×4 IMPLANT
DURAPREP 26ML APPLICATOR (WOUND CARE) ×8 IMPLANT
ELECT REM PT RETURN 15FT ADLT (MISCELLANEOUS) ×4 IMPLANT
EVACUATOR 1/8 PVC DRAIN (DRAIN) ×4 IMPLANT
FEMUR SIGMA PS SZ 5.0 R (Femur) ×4 IMPLANT
GAUZE SPONGE 2X2 8PLY STRL LF (GAUZE/BANDAGES/DRESSINGS) ×2 IMPLANT
GLOVE BIO SURGEON STRL SZ7.5 (GLOVE) ×8 IMPLANT
GLOVE BIOGEL PI IND STRL 8 (GLOVE) ×4 IMPLANT
GLOVE BIOGEL PI INDICATOR 8 (GLOVE) ×4
GLOVE ECLIPSE 8.0 STRL XLNG CF (GLOVE) ×8 IMPLANT
GLOVE SURG ORTHO 9.0 STRL STRW (GLOVE) ×4 IMPLANT
GOWN STRL REUS W/TWL XL LVL3 (GOWN DISPOSABLE) ×8 IMPLANT
HANDPIECE INTERPULSE COAX TIP (DISPOSABLE) ×4
HOLDER FOLEY CATH W/STRAP (MISCELLANEOUS) ×4 IMPLANT
IMMOBILIZER KNEE 20 (SOFTGOODS) ×4
IMMOBILIZER KNEE 20 THIGH 36 (SOFTGOODS) ×2 IMPLANT
NS IRRIG 1000ML POUR BTL (IV SOLUTION) ×4 IMPLANT
PACK TOTAL KNEE CUSTOM (KITS) ×4 IMPLANT
PATELLA DOME PFC 41MM (Knees) ×4 IMPLANT
PIN STEINMAN FIXATION KNEE (PIN) ×4 IMPLANT
PLATE ROT INSERT 10MM SIZE 5 (Plate) ×4 IMPLANT
PROTECTOR NERVE ULNAR (MISCELLANEOUS) ×4 IMPLANT
SET HNDPC FAN SPRY TIP SCT (DISPOSABLE) ×2 IMPLANT
SET PAD KNEE POSITIONER (MISCELLANEOUS) ×4 IMPLANT
SPONGE GAUZE 2X2 STER 10/PKG (GAUZE/BANDAGES/DRESSINGS) ×2
SPONGE LAP 18X18 RF (DISPOSABLE) IMPLANT
SPONGE SURGIFOAM ABS GEL 100 (HEMOSTASIS) ×4 IMPLANT
STOCKINETTE 6  STRL (DRAPES) ×2
STOCKINETTE 6 STRL (DRAPES) ×2 IMPLANT
SUT BONE WAX W31G (SUTURE) IMPLANT
SUT MNCRL AB 3-0 PS2 18 (SUTURE) ×4 IMPLANT
SUT VIC AB 1 CT1 27 (SUTURE) ×8
SUT VIC AB 1 CT1 27XBRD ANTBC (SUTURE) ×8 IMPLANT
SUT VIC AB 2-0 CT1 27 (SUTURE) ×6
SUT VIC AB 2-0 CT1 TAPERPNT 27 (SUTURE) ×4 IMPLANT
SUT VLOC 180 0 24IN GS25 (SUTURE) ×4 IMPLANT
SYR 50ML LL SCALE MARK (SYRINGE) IMPLANT
TAPE STRIPS DRAPE STRL (GAUZE/BANDAGES/DRESSINGS) ×4 IMPLANT
TIBIA MBT CEMENT SIZE 5 (Knees) ×4 IMPLANT
TRAY FOLEY MTR SLVR 16FR STAT (SET/KITS/TRAYS/PACK) ×4 IMPLANT
WATER STERILE IRR 1000ML POUR (IV SOLUTION) ×8 IMPLANT
WRAP KNEE MAXI GEL POST OP (GAUZE/BANDAGES/DRESSINGS) ×4 IMPLANT
YANKAUER SUCT BULB TIP 10FT TU (MISCELLANEOUS) ×4 IMPLANT

## 2018-05-18 NOTE — Anesthesia Preprocedure Evaluation (Addendum)
Anesthesia Evaluation  Patient identified by MRN, date of birth, ID band Patient awake    Reviewed: Allergy & Precautions, NPO status , Patient's Chart, lab work & pertinent test results  History of Anesthesia Complications Negative for: history of anesthetic complications  Airway Mallampati: II  TM Distance: >3 FB Neck ROM: Full    Dental  (+) Teeth Intact   Pulmonary sleep apnea , Current Smoker,    Pulmonary exam normal breath sounds clear to auscultation       Cardiovascular negative cardio ROS Normal cardiovascular exam Rhythm:Regular Rate:Normal     Neuro/Psych Depression Bipolar Disorder negative neurological ROS     GI/Hepatic negative GI ROS, Neg liver ROS,   Endo/Other  negative endocrine ROSObese, BMI 37.7  Renal/GU negative Renal ROS     Musculoskeletal  (+) Arthritis , Osteoarthritis,    Abdominal   Peds  Hematology negative hematology ROS (+)   Anesthesia Other Findings Day of surgery medications reviewed with the patient.  Reproductive/Obstetrics                            Anesthesia Physical Anesthesia Plan  ASA: II  Anesthesia Plan: Spinal   Post-op Pain Management:  Regional for Post-op pain   Induction:   PONV Risk Score and Plan: Treatment may vary due to age or medical condition, Propofol infusion and Ondansetron  Airway Management Planned: Natural Airway and Nasal Cannula  Additional Equipment:   Intra-op Plan:   Post-operative Plan:   Informed Consent: I have reviewed the patients History and Physical, chart, labs and discussed the procedure including the risks, benefits and alternatives for the proposed anesthesia with the patient or authorized representative who has indicated his/her understanding and acceptance.     Plan Discussed with: CRNA  Anesthesia Plan Comments:        Anesthesia Quick Evaluation

## 2018-05-18 NOTE — Progress Notes (Signed)
Pt. set up/placed on CPAP for h/s in Auto mode per home regimen, remains on room air, aware to notify if needed, family member remains at bedside.

## 2018-05-18 NOTE — Anesthesia Procedure Notes (Signed)
Spinal  Patient location during procedure: OR Start time: 05/18/2018 10:36 AM End time: 05/18/2018 10:39 AM Staffing Anesthesiologist: Brennan Bailey, MD Performed: anesthesiologist  Preanesthetic Checklist Completed: patient identified, surgical consent, pre-op evaluation, timeout performed, IV checked, risks and benefits discussed and monitors and equipment checked Spinal Block Patient position: sitting Prep: site prepped and draped and DuraPrep Patient monitoring: cardiac monitor, continuous pulse ox and blood pressure Approach: midline Location: L3-4 Injection technique: single-shot Needle Needle type: Pencan and Whitacre  Needle gauge: 22 G Needle length: 9 cm Additional Notes Risks, benefits, and alternative discussed. Patient gave consent to procedure. Prepped and draped in sitting position. First attempt with Pencan 24g, persistent os encountered at L3-4. Second attempt with Whitacre 22g, clear CSF on first pass. Positive terminal aspiration. No pain or paraesthesias with injection. Patient tolerated procedure well. Vital signs stable. Tawny Asal, MD

## 2018-05-18 NOTE — Anesthesia Postprocedure Evaluation (Signed)
Anesthesia Post Note  Patient: Harry Carroll  Procedure(s) Performed: TOTAL KNEE ARTHROPLASTY (Right Knee) URETERAL DILITATION with foley insertion (N/A Urethra)     Patient location during evaluation: PACU Anesthesia Type: Spinal Level of consciousness: awake and alert Pain management: pain level controlled Vital Signs Assessment: post-procedure vital signs reviewed and stable Respiratory status: spontaneous breathing, nonlabored ventilation and respiratory function stable Cardiovascular status: blood pressure returned to baseline and stable Postop Assessment: no apparent nausea or vomiting and spinal receding Anesthetic complications: no    Last Vitals:  Vitals:   05/18/18 1330 05/18/18 1345  BP: (!) 127/96 (!) 142/85  Pulse: 89 81  Resp: (!) 24 16  Temp:    SpO2: 97% 98%    Last Pain:  Vitals:   05/18/18 1345  TempSrc:   PainSc: [place]

## 2018-05-18 NOTE — Evaluation (Signed)
Physical Therapy Evaluation Patient Details Name: Harry Carroll MRN: [id number] DOB: 1960/08/23 Today's Date: 05/18/2018   History of Present Illness  Pt s/p R TKR and with hx of L TKR 7/19 and bipolar  Clinical Impression  Pt s/p R TKR and presents with decreased R LE strength/ROM and post op pain limiting functional mobility.  Pt should progress to dc home with family assist.    Follow Up Recommendations Follow surgeon's recommendation for DC plan and follow-up therapies    Equipment Recommendations  None recommended by PT    Recommendations for Other Services       Precautions / Restrictions Precautions Precautions: Knee;Fall Required Braces or Orthoses: Knee Immobilizer - Right Knee Immobilizer - Right: Discontinue once straight leg raise with < 10 degree lag Restrictions Weight Bearing Restrictions: No Other Position/Activity Restrictions: WBAT      Mobility  Bed Mobility Overal bed mobility: Needs Assistance Bed Mobility: Supine to Sit     Supine to sit: Min guard     General bed mobility comments: min cues for sequence and use of L LE to self assist  Transfers Overall transfer level: Needs assistance Equipment used: Rolling walker (2 wheeled) Transfers: Sit to/from Stand Sit to Stand: Min guard         General transfer comment: cues for LE management and use of UEs to self assist  Ambulation/Gait Ambulation/Gait assistance: Min assist;Min guard Gait Distance (Feet): 80 Feet Assistive device: Rolling walker (2 wheeled) Gait Pattern/deviations: Step-to pattern;Decreased step length - right;Decreased step length - left;Shuffle Gait velocity: decr   General Gait Details: cues for sequence, posture and position from [employer]            Wheelchair Mobility    Modified Rankin (Stroke Patients Only)       Balance Overall balance assessment: Mild deficits observed, not formally tested                                            Pertinent Vitals/Pain Pain Assessment: 0-10 Pain Score: 4  Pain Location: R knee Pain Descriptors / Indicators: Aching;Sore Pain Intervention(s): Limited activity within patient's tolerance;Monitored during session;Premedicated before session;Ice applied    Home Living Family/patient expects to be discharged to:: Private residence Living Arrangements: Spouse/significant other Available Help at Discharge: Family Type of Home: House Home Access: Stairs to enter Entrance Stairs-Rails: Right Entrance Stairs-Number of Steps: 3 Home Layout: Two level [place] - single point;Crutches;Other (comment);Walker - 2 wheels;Bedside commode      Prior Function Level of Independence: Independent               Hand Dominance        Extremity/Trunk Assessment   Upper Extremity Assessment Upper Extremity Assessment: Overall WFL for tasks assessed    Lower Extremity Assessment Lower Extremity Assessment: RLE deficits/detail    Cervical / Trunk Assessment Cervical / Trunk Assessment: Normal  Communication   Communication: No difficulties  Cognition Arousal/Alertness: Awake/alert Behavior During Therapy: WFL for tasks assessed/performed Overall Cognitive Status: Within Functional Limits for tasks assessed                                        General Comments      Exercises Total Joint Exercises Ankle Circles/Pumps: AROM;Both;15 reps;Supine  Assessment/Plan    PT Assessment Patient needs continued PT services  PT Problem List Decreased strength;Decreased range of motion;Decreased activity tolerance;Decreased mobility;Decreased knowledge of use of DME;Pain       PT Treatment Interventions DME instruction;Gait training;Stair training;Functional mobility training;Therapeutic activities;Therapeutic exercise;Patient/family education    PT Goals (Current goals can be found in the Care Plan section)  Acute Rehab PT Goals Patient Stated  Goal: Regain IND PT Goal Formulation: With patient Time For Goal Achievement: 05/25/18 Potential to Achieve Goals: Good    Frequency 7X/week   Barriers to discharge        Co-evaluation               AM-PAC PT "6 Clicks" Mobility  Outcome Measure Help needed turning from your back to your side while in a flat bed without using bedrails?: A Little Help needed moving from lying on your back to sitting on the side of a flat bed without using bedrails?: A Little Help needed moving to and from a bed to a chair (including a wheelchair)?: A Little Help needed standing up from a chair using your arms (e.g., wheelchair or bedside chair)?: A Little Help needed to walk in hospital room?: A Little Help needed climbing 3-5 steps with a railing? : A Little 6 Click Score: 18    End of Session Equipment Utilized During Treatment: Gait belt;Right knee immobilizer Activity Tolerance: Patient tolerated treatment well Patient left: in chair;with call bell/phone within reach;with family/visitor present Nurse Communication: Mobility status PT Visit Diagnosis: Difficulty in walking, not elsewhere classified (R26.2)    Time: 3716-9678 PT Time Calculation (min) (ACUTE ONLY): 22 min   Charges:   PT Evaluation $PT Eval Low Complexity: 1 Low          [place] Pager [phone] Office [phone]   Makylah Bossard 05/18/2018, 6:04 PM

## 2018-05-18 NOTE — Interval H&P Note (Signed)
History and Physical Interval Note:  05/18/2018 7:41 AM  Harry Carroll  has presented today for surgery, with the diagnosis of Right knee osteoarthritis  The various methods of treatment have been discussed with the patient and family. After consideration of risks, benefits and other options for treatment, the patient has consented to  Procedure(s): TOTAL KNEE ARTHROPLASTY (Right) as a surgical intervention .  The patient's history has been reviewed, patient examined, no change in status, stable for surgery.  I have reviewed the patient's chart and labs.  Questions were answered to the patient's satisfaction.     Lidia Clavijo ANDREW

## 2018-05-18 NOTE — Anesthesia Procedure Notes (Addendum)
Anesthesia Regional Block: Adductor canal block   Pre-Anesthetic Checklist: ,, timeout performed, Correct Patient, Correct Site, Correct Laterality, Correct Procedure, Correct Position, site marked, Risks and benefits discussed, pre-op evaluation,  At surgeon's request and post-op pain management  Laterality: Right  Prep: Maximum Sterile Barrier Precautions used, chloraprep       Needles:  Injection technique: Single-shot  Needle Type: Echogenic Stimulator Needle     Needle Length: 9cm  Needle Gauge: 22     Additional Needles:   Procedures:,,,, ultrasound used (permanent image in chart),,,,  Narrative:  Start time: 05/18/2018 9:37 AM End time: 05/18/2018 9:39 AM Injection made incrementally with aspirations every 5 mL.  Performed by: Personally  Anesthesiologist: Brennan Bailey, MD  Additional Notes: Risks, benefits, and alternative discussed. Patient gave consent for procedure. Patient prepped and draped in sterile fashion. Sedation administered, patient remains easily responsive to voice. Relevant anatomy identified with ultrasound guidance. Local anesthetic given in 5cc increments with no signs or symptoms of intravascular injection. No pain or paraesthesias with injection. Patient monitored throughout procedure with signs of LAST or immediate complications. Tolerated well. Ultrasound image placed in chart.  Tawny Asal, MD

## 2018-05-18 NOTE — Interval H&P Note (Signed)
History and Physical Interval Note:  05/18/2018 7:42 AM  Harry Carroll  has presented today for surgery, with the diagnosis of Right knee osteoarthritis  The various methods of treatment have been discussed with the patient and family. After consideration of risks, benefits and other options for treatment, the patient has consented to  Procedure(s): TOTAL KNEE ARTHROPLASTY (Right) as a surgical intervention .  The patient's history has been reviewed, patient examined, no change in status, stable for surgery.  I have reviewed the patient's chart and labs.  Questions were answered to the patient's satisfaction.     Kort Stettler ANDREW

## 2018-05-18 NOTE — Transfer of Care (Signed)
Immediate Anesthesia Transfer of Care Note  Patient: Harry Carroll  Procedure(s) Performed: TOTAL KNEE ARTHROPLASTY (Right Knee) URETERAL DILITATION with foley insertion (N/A Urethra)  Patient Location: PACU  Anesthesia Type:MAC and Spinal  Level of Consciousness: awake, alert , oriented and patient cooperative  Airway & Oxygen Therapy: Patient Spontanous Breathing and Patient connected to face mask oxygen  Post-op Assessment: Report given to RN, Post -op Vital signs reviewed and stable and Patient moving all extremities  Post vital signs: Reviewed and stable  Last Vitals:  Vitals Value Taken Time  BP 137/84 05/18/2018  1:09 PM  Temp    Pulse 86 05/18/2018  1:11 PM  Resp 20 05/18/2018  1:11 PM  SpO2 98 % 05/18/2018  1:11 PM  Vitals shown include unvalidated device data.  Last Pain:  Vitals:   05/18/18 0950  TempSrc:   PainSc: 0-No pain      Patients Stated Pain Goal: 4 (50/35/46 [id number])  Complications: No apparent anesthesia complications

## 2018-05-18 NOTE — Progress Notes (Signed)
Assisted Dr. Howze with right, ultrasound guided, adductor canal block. Side rails up, monitors on throughout procedure. See vital signs in flow sheet. Tolerated Procedure well.  

## 2018-05-18 NOTE — Op Note (Signed)
DATE OF SURGERY:  05/18/2018  TIME: 12:52 PM  PATIENT NAME:  Harry Carroll    AGE: 57 y.o.   PRE-OPERATIVE DIAGNOSIS:  Right knee osteoarthritis  POST-OPERATIVE DIAGNOSIS:  Right knee osteoarthritis  PROCEDURE:  Procedure(s): TOTAL KNEE ARTHROPLASTY URETERAL DILITATION with foley insertion  SURGEON:  Ayomide Purdy ANDREW  ASSISTANT:  Bryson Stilwell, PA-C, present and scrubbed throughout the case, critical for assistance with exposure, retraction, instrumentation, and closure.  OPERATIVE IMPLANTS: Depuy PFC Sigma Rotating Platform.  Femur size 5, Tibia size 5, Patella size 42 3-peg oval button, with a 10 mm polyethylene insert.   PREOPERATIVE INDICATIONS:   Harry Carroll is a 57 y.o. year old male with end stage bone on bone arthritis of the knee who failed conservative treatment and elected for Total Knee Arthroplasty.   The risks, benefits, and alternatives were discussed at length including but not limited to the risks of infection, bleeding, nerve injury, stiffness, blood clots, the need for revision surgery, cardiopulmonary complications, among others, and they were willing to proceed.  OPERATIVE DESCRIPTION:  The patient was brought to the operative room and placed in a supine position.  Spinal anesthesia was administered.  IV antibiotics were given.  The lower extremity was prepped and draped in the usual sterile fashion.  Time out was performed.  The leg was elevated and exsanguinated and the tourniquet was inflated.  Anterior quadriceps tendon splitting approach was performed.  The patella was retracted and osteophytes were removed.  The anterior horn of the medial and lateral meniscus was removed and cruciate ligaments resected.   The distal femur was opened with the drill and the intramedullary distal femoral cutting jig was utilized, set at 5 degrees resecting 10 mm off the distal femur.  Care was taken to protect the collateral ligaments.  The distal femoral  sizing jig was applied, taking care to avoid notching.  Then the 4-in-1 cutting jig was applied and the anterior and posterior femur was cut, along with the chamfer cuts.    Then the extramedullary tibial cutting jig was utilized making the appropriate cut using the anterior tibial crest as a reference building in appropriate posterior slope.  Care was taken during the cut to protect the medial and collateral ligaments.  The proximal tibia was removed along with the posterior horns of the menisci.   The posterior medial femoral osteophytes and posterior lateral femoral osteophytes were removed.    The flexion gap was then measured and was symmetric with the extension gap, measured at 10.  I completed the distal femoral preparation using the appropriate jig to prepare the box.  The patella was then measured, and cut with the saw.    The proximal tibia sized and prepared accordingly with the reamer and the punch, and then all components were trialed with the trial insert.  The knee was found to have excellent balance and full motion.    The above named components were then cemented into place and all excess cement was removed.  The trial polyethylene component was in place during cementation, and then was exchanged for the real polyethylene component.    The knee was easily taken through a range of motion and the patella tracked well and the knee irrigated copiously and the parapatellar and subcutaneous tissue closed with vicryl, and monocryl with steri strips for the skin.  The arthrotomy was closed at 90 of flexion. The wounds were dressed with sterile gauze and the tourniquet released and the patient was awakened and  returned to the PACU in stable and satisfactory condition.  There were no complications.  Total tourniquet time was 75  minutes.

## 2018-05-18 NOTE — Op Note (Signed)
Preoperative diagnosis: urethral stricture  Postoperative diagnosis: Same  Procedure: 1. Urethral dilation from 8 french to 20 french 2. Foley catheter placement  Attending: Nicolette Bang  Anesthesia: General  History of blood loss: Minimal  Antibiotics: Ancef  Specimens: none  Drains: 18 french silicone catheter  Findings: dense bulbar urethral stricture.  No masses/lesions in the bladder.  Indications: Patient is a 57 year old male with a history of urethral stricture who is undergoing knee replacement and nursing was unable to place a foley catheter. Urology was consulted for foley catheter placement.  Procedure in detail: His genitalia was then prepped and draped in usual sterile fashion. Using the Hagar dilators the urethra was dilated to 20 french. We then placed an 18 French foley catheter.  This then concluded the procedure which was well tolerated by the patient.  Complications: None  Condition: Stable  Plan:  He is to follow up in 1 week for voiding trial.

## 2018-05-19 LAB — BASIC METABOLIC PANEL
ANION GAP: 8 (ref 5–15)
BUN: 17 mg/dL (ref 6–20)
CALCIUM: 8.7 mg/dL — AB (ref 8.9–10.3)
CO2: 23 mmol/L (ref 22–32)
Chloride: 106 mmol/L (ref 98–111)
Creatinine, Ser: 0.96 mg/dL (ref 0.61–1.24)
GFR calc Af Amer: >60 mL/min (ref >60)
Glucose, Bld: 178 mg/dL — ABNORMAL HIGH (ref 70–99)
Potassium: 4.3 mmol/L (ref 3.5–5.1)
Sodium: 137 mmol/L (ref 135–145)

## 2018-05-19 LAB — CBC
HCT: 38.6 % — ABNORMAL LOW (ref 39.0–52.0)
Hemoglobin: 13.5 g/dL (ref 13.0–17.0)
MCH: 31.3 pg (ref 26.0–34.0)
MCHC: 35 g/dL (ref 30.0–36.0)
MCV: 89.6 fL (ref 80.0–100.0)
NRBC: 0 % (ref 0.0–0.2)
Platelets: 179 10*3/uL (ref 150–400)
RBC: 4.31 MIL/uL (ref 4.22–5.81)
RDW: 13 % (ref 11.5–15.5)
WBC: 12.8 10*3/uL — ABNORMAL HIGH (ref 4.0–10.5)

## 2018-05-19 NOTE — Progress Notes (Addendum)
Subjective: 1 Day Post-Op Procedure(s) (LRB): TOTAL KNEE ARTHROPLASTY (Right) URETERAL DILITATION with foley insertion (N/A) Patient reports pain as 3 on 0-10 scale.    Objective: Vital signs in last 24 hours: Temp:  [97.4 F (36.3 C)-98.8 F (37.1 C)] 97.9 F (36.6 C) (12/07 0552) Pulse Rate:  [73-99] 79 (12/07 0552) Resp:  [9-24] 18 (12/07 0552) BP: (117-165)/(82-107) 136/87 (12/07 0552) SpO2:  [95 %-100 %] 97 % (12/07 0552) Weight:  [113.4 kg] 113.4 kg (12/06 0833)  Intake/Output from previous day: 12/06 0701 - 12/07 0700 In: 4360.7 [P.O.:480; I.V.:3584; IV Piggyback:296.7] Out: 2497 [Urine:2425; Drains:22; Blood:50] Intake/Output this shift: Total I/O In: 1025.9 [I.V.:1025.9] Out: -   Recent Labs    05/19/18 0336  HGB 13.5   Recent Labs    05/19/18 0336  WBC 12.8*  RBC 4.31  HCT 38.6*  PLT 179   Recent Labs    05/19/18 0336  NA 137  K 4.3  CL 106  CO2 23  BUN 17  CREATININE 0.96  GLUCOSE 178*  CALCIUM 8.7*   No results for input(s): LABPT, INR in the last 72 hours.  Neurologically intact ABD soft Neurovascular intact Sensation intact distally Intact pulses distally Dorsiflexion/Plantar flexion intact Incision: dressing C/D/I and no drainage hemovac d/c'd tip intact. compt soft. Moderate swelling No DVT  Assessment/Plan: 1 Day Post-Op Procedure(s) (LRB): TOTAL KNEE ARTHROPLASTY (Right) URETERAL DILITATION with foley insertion (N/A) Advance diet Up with therapy D/C IV fluids Plan for discharge tomorrow  elevatuion Low carb diet. Gluc up this AM  Anticipated LOS equal to or greater than 2 midnights due to - Age 59 and older with one or more of the following:  - Obesity  - Expected need for hospital services (PT, OT, Nursing) required for safe  discharge  - Anticipated need for postoperative skilled nursing care or inpatient rehab  - Active co-morbidities: sleep apnrea, obesity    Harry Carroll 05/19/2018, 8:04 AM

## 2018-05-19 NOTE — Care Management Note (Signed)
Case Management Note  Patient Details  Name: Harry Carroll MRN: [id number] Date of Birth: 26-Jan-1961  Subjective/Objective:     S/p R TKR               Action/Plan: NCM spoke to pt and offered choice for HH/CMS list provided/placed on chart. Pt states he has RW and 3n1 bedside commode from previous surgery. Wife will be at home to assist with care.   Expected Discharge Date:  05/18/18               Expected Discharge Plan:  [place]  In-House Referral:  NA  Discharge planning Services  CM Consult  Post Acute Care Choice:  Home Health Choice offered to:  Patient  DME Arranged:  N/A DME Agency:  NA  HH Arranged:  PT [place] Agency:  [facility] (formerly [employer])  Status of Service:  Completed, signed off  If discussed at [employer], dates discussed:    Additional Comments:  Erenest Rasher, RN 05/19/2018, 4:30 PM

## 2018-05-19 NOTE — Progress Notes (Signed)
Physical Therapy Treatment Patient Details Name: Harry Carroll MRN: [id number] DOB: February 21, 1961 Today's Date: 05/19/2018    History of Present Illness Pt s/p R TKR and with hx of L TKR 7/19 and bipolar    PT Comments    Pt motivated and progressing very well with ROM, strengthening and mobility.   Follow Up Recommendations  Follow surgeon's recommendation for DC plan and follow-up therapies     Equipment Recommendations  None recommended by PT    Recommendations for Other Services       Precautions / Restrictions Precautions Precautions: Knee;Fall Required Braces or Orthoses: Knee Immobilizer - Right Knee Immobilizer - Right: Discontinue once straight leg raise with < 10 degree lag Restrictions Weight Bearing Restrictions: No Other Position/Activity Restrictions: WBAT    Mobility  Bed Mobility Overal bed mobility: Needs Assistance Bed Mobility: Supine to Sit     Supine to sit: Supervision     General bed mobility comments: min cues for sequence and use of L LE to self assist  Transfers Overall transfer level: Needs assistance Equipment used: Rolling walker (2 wheeled) Transfers: Sit to/from Stand Sit to Stand: Supervision         General transfer comment: cues for LE management and use of UEs to self assist  Ambulation/Gait Ambulation/Gait assistance: Min guard;Supervision Gait Distance (Feet): 300 Feet Assistive device: Rolling walker (2 wheeled) Gait Pattern/deviations: Step-to pattern;Decreased step length - right;Decreased step length - left;Shuffle     General Gait Details: min cues for sequence, posture and position from RW   Stairs Stairs: Yes Stairs assistance: Min guard Stair Management: One rail Right;Step to pattern;Forwards;With crutches Number of Stairs: 4 General stair comments: min cues for sequence and foot/crutch placement   Wheelchair Mobility    Modified Rankin (Stroke Patients Only)       Balance Overall balance  assessment: Mild deficits observed, not formally tested                                          Cognition Arousal/Alertness: Awake/alert Behavior During Therapy: WFL for tasks assessed/performed Overall Cognitive Status: Within Functional Limits for tasks assessed                                        Exercises Total Joint Exercises Ankle Circles/Pumps: AROM;Both;15 reps;Supine Quad Sets: AROM;Both;10 reps;Supine Heel Slides: AAROM;Right;15 reps;Supine Straight Leg Raises: AAROM;AROM;Right;15 reps;Supine Long Arc Quad: AROM;Right;10 reps;Supine Knee Flexion: AROM;AAROM;Right;10 reps;Seated    General Comments        Pertinent Vitals/Pain Pain Assessment: 0-10 Pain Score: 4  Pain Location: R knee Pain Descriptors / Indicators: Aching;Sore Pain Intervention(s): Limited activity within patient's tolerance;Monitored during session;Premedicated before session;Ice applied    Home Living                      Prior Function            PT Goals (current goals can now be found in the care plan section) Acute Rehab PT Goals Patient Stated Goal: Regain IND PT Goal Formulation: With patient Time For Goal Achievement: 05/25/18 Potential to Achieve Goals: Good Progress towards PT goals: Progressing toward goals    Frequency    7X/week      PT Plan Current plan remains appropriate  Co-evaluation              AM-PAC PT "6 Clicks" Mobility   Outcome Measure  Help needed turning from your back to your side while in a flat bed without using bedrails?: None Help needed moving from lying on your back to sitting on the side of a flat bed without using bedrails?: None Help needed moving to and from a bed to a chair (including a wheelchair)?: A Little Help needed standing up from a chair using your arms (e.g., wheelchair or bedside chair)?: A Little Help needed to walk in hospital room?: A Little Help needed climbing 3-5  steps with a railing? : A Little 6 Click Score: 20    End of Session Equipment Utilized During Treatment: Gait belt Activity Tolerance: Patient tolerated treatment well Patient left: in chair;with call bell/phone within reach;with family/visitor present Nurse Communication: Mobility status PT Visit Diagnosis: Difficulty in walking, not elsewhere classified (R26.2)     Time: 8502-7741 PT Time Calculation (min) (ACUTE ONLY): 29 min  Charges:  $Gait Training: 8-22 mins $Therapeutic Exercise: 8-22 mins                     [place] Pager [phone] Office [phone]    Harry Carroll 05/19/2018, 4:32 PM

## 2018-05-19 NOTE — Progress Notes (Signed)
Physical Therapy Treatment Patient Details Name: Harry Carroll MRN: [id number] DOB: [birth year] Today's Date: 05/19/2018    History of Present Illness Pt s/p R TKR and with hx of L TKR 7/19 and bipolar    PT Comments    Pt motivated and progressing well with mobility.     Follow Up Recommendations  Follow surgeon's recommendation for DC plan and follow-up therapies     Equipment Recommendations  None recommended by PT    Recommendations for Other Services       Precautions / Restrictions Precautions Precautions: Knee;Fall Required Braces or Orthoses: Knee Immobilizer - Right Knee Immobilizer - Right: Discontinue once straight leg raise with < 10 degree lag(Pt performed IND SLR this am) Restrictions Weight Bearing Restrictions: No Other Position/Activity Restrictions: WBAT    Mobility  Bed Mobility Overal bed mobility: Needs Assistance Bed Mobility: Supine to Sit     Supine to sit: Supervision     General bed mobility comments: min cues for sequence and use of L LE to self assist  Transfers Overall transfer level: Needs assistance Equipment used: Rolling walker (2 wheeled) Transfers: Sit to/from Stand Sit to Stand: Min guard;Supervision         General transfer comment: cues for LE management and use of UEs to self assist  Ambulation/Gait Ambulation/Gait assistance: Min guard Gait Distance (Feet): 200 Feet Assistive device: Rolling walker (2 wheeled) Gait Pattern/deviations: Step-to pattern;Decreased step length - right;Decreased step length - left;Shuffle Gait velocity: decr   General Gait Details: min cues for sequence, posture and position from [employer]             Wheelchair Mobility    Modified Rankin (Stroke Patients Only)       Balance Overall balance assessment: Mild deficits observed, not formally tested                                          Cognition Arousal/Alertness: Awake/alert Behavior During  Therapy: WFL for tasks assessed/performed Overall Cognitive Status: Within Functional Limits for tasks assessed                                        Exercises Total Joint Exercises Ankle Circles/Pumps: AROM;Both;15 reps;Supine Quad Sets: AROM;Both;10 reps;Supine Heel Slides: AAROM;Right;15 reps;Supine Straight Leg Raises: AAROM;AROM;Right;15 reps;Supine Goniometric ROM: AAROM R knee -10 - 75    General Comments        Pertinent Vitals/Pain Pain Assessment: 0-10 Pain Score: 4  Pain Location: R knee Pain Descriptors / Indicators: Aching;Sore Pain Intervention(s): Limited activity within patient's tolerance;Monitored during session;Premedicated before session;Ice applied    Home Living                      Prior Function            PT Goals (current goals can now be found in the care plan section) Acute Rehab PT Goals Patient Stated Goal: Regain IND PT Goal Formulation: With patient Time For Goal Achievement: 05/25/18 Potential to Achieve Goals: Good Progress towards PT goals: Progressing toward goals    Frequency    7X/week      PT Plan Current plan remains appropriate    Co-evaluation  AM-PAC PT "6 Clicks" Mobility   Outcome Measure  Help needed turning from your back to your side while in a flat bed without using bedrails?: None Help needed moving from lying on your back to sitting on the side of a flat bed without using bedrails?: None Help needed moving to and from a bed to a chair (including a wheelchair)?: A Little Help needed standing up from a chair using your arms (e.g., wheelchair or bedside chair)?: A Little Help needed to walk in hospital room?: A Little Help needed climbing 3-5 steps with a railing? : A Little 6 Click Score: 20    End of Session Equipment Utilized During Treatment: Gait belt Activity Tolerance: Patient tolerated treatment well Patient left: in chair;with call bell/phone within  reach;with family/visitor present Nurse Communication: Mobility status PT Visit Diagnosis: Difficulty in walking, not elsewhere classified (R26.2)     Time: 8206-0156 PT Time Calculation (min) (ACUTE ONLY): 26 min  Charges:  $Gait Training: 8-22 mins $Therapeutic Exercise: 8-22 mins                     [place] Pager [phone] Office [phone]    Kregg Cihlar 05/19/2018, 1:02 PM

## 2018-05-20 LAB — CBC
HCT: 36.5 % — ABNORMAL LOW (ref 39.0–52.0)
Hemoglobin: 12.4 g/dL — ABNORMAL LOW (ref 13.0–17.0)
MCH: 31.4 pg (ref 26.0–34.0)
MCHC: 34 g/dL (ref 30.0–36.0)
MCV: 92.4 fL (ref 80.0–100.0)
PLATELETS: 178 10*3/uL (ref 150–400)
RBC: 3.95 MIL/uL — ABNORMAL LOW (ref 4.22–5.81)
RDW: 13.7 % (ref 11.5–15.5)
WBC: 10.8 10*3/uL — ABNORMAL HIGH (ref 4.0–10.5)
nRBC: 0 % (ref 0.0–0.2)

## 2018-05-20 NOTE — Discharge Summary (Signed)
Physician Discharge Summary  Patient ID: Harry Carroll MRN: [id number] DOB/AGE: 18-Jul-1960 57 y.o.  Admit date: 05/18/2018 Discharge date: 05/20/2018  Admission Diagnoses: R knee osteoarthritis; hx of hyperlipidemia, bipolar, depression, insomnia, abnormal LFTs, fatigue, sleep apnea, diverticulitis, dizziness, abdominal pain, chest pain, and obesity  Discharge Diagnoses:  Active Problems:   S/P TKR (total knee replacement) using cement, right   S/P knee replacement same as above  Discharged Condition: stable  Hospital Course: Patient presented to [facility] on 05/18/18 for elective R TKR by Dr. Theda Sers.  The patient tolerated the procedure well and was admitted to the hospital.  The patient worked well with therapy.  He tolerated his stay without complication.  Patient to be discharged home on 05/20/18.  Consults: PT/OT  Significant Diagnostic Studies: N/A   Treatments: IV hydration, antibiotics: Ancef, analgesia: acetaminophen, Vicodin, Morphine and fentanyl, anticoagulation: lovenox and surgery: as stated above  Discharge Exam: Blood pressure (!) 148/90, pulse 70, temperature 98.3 F (36.8 C), temperature source Oral, resp. rate 16, height 5\' 9"  (1.753 m), weight 113.4 kg, SpO2 95 %. General: WDWN patient in NAD. Psych:  Appropriate mood and affect. Neuro:  A&O x 3, Moving all extremities, sensation intact to light touch HEENT:  EOMs intact Chest:  Even non-labored respirations Skin: Dressing C/D/I, no rashes or lesions Extremities: warm/dry, mild edema, no visible erythema or echymosis.  No lymphadenopathy. Pulses: Popliteus 2+ MSK:  ROM: lacks 5 degrees of TKE, MMT: able to perform quad set, (-) Homan's   Disposition: Discharge disposition: 01-Home or Self Care       Discharge Instructions    Call MD / Call 911   Complete by:  As directed    If you experience chest pain or shortness of breath, CALL 911 and be transported to the hospital emergency room.  If you  develope a fever above 101 F, pus (white drainage) or increased drainage or redness at the wound, or calf pain, call your surgeon's office.   Call MD / Call 911   Complete by:  As directed    If you experience chest pain or shortness of breath, CALL 911 and be transported to the hospital emergency room.  If you develope a fever above 101 F, pus (white drainage) or increased drainage or redness at the wound, or calf pain, call your surgeon's office.   Constipation Prevention   Complete by:  As directed    Drink plenty of fluids.  Prune juice may be helpful.  You may use a stool softener, such as Colace (over the counter) 100 mg twice a day.  Use MiraLax (over the counter) for constipation as needed.   Constipation Prevention   Complete by:  As directed    Drink plenty of fluids.  Prune juice may be helpful.  You may use a stool softener, such as Colace (over the counter) 100 mg twice a day.  Use MiraLax (over the counter) for constipation as needed.   Diet - low sodium heart healthy   Complete by:  As directed    Diet - low sodium heart healthy   Complete by:  As directed    Discharge instructions   Complete by:  As directed    INSTRUCTIONS AFTER JOINT REPLACEMENT   Remove items at home which could result in a fall. This includes throw rugs or furniture in walking pathways ICE to the affected joint every three hours while awake for 30 minutes at a time, for at least the first 3-5  days, and then as needed for pain and swelling.  Continue to use ice for pain and swelling. You may notice swelling that will progress down to the foot and ankle.  This is normal after surgery.  Elevate your leg when you are not up walking on it.   Continue to use the breathing machine you got in the hospital (incentive spirometer) which will help keep your temperature down.  It is common for your temperature to cycle up and down following surgery, especially at night when you are not up moving around and exerting  yourself.  The breathing machine keeps your lungs expanded and your temperature down.   DIET:  As you were doing prior to hospitalization, we recommend a well-balanced diet.  DRESSING / WOUND CARE / SHOWERING  Keep the surgical dressing until follow up.  The dressing is water proof, so you can shower without any extra covering.  IF THE DRESSING FALLS OFF or the wound gets wet inside, change the dressing with sterile gauze.  Please use good hand washing techniques before changing the dressing.  Do not use any lotions or creams on the incision until instructed by your surgeon.    ACTIVITY  Increase activity slowly as tolerated, but follow the weight bearing instructions below.   No driving for 6 weeks or until further direction given by your physician.  You cannot drive while taking narcotics.  No lifting or carrying greater than 10 lbs. until further directed by your surgeon. Avoid periods of inactivity such as sitting longer than an hour when not asleep. This helps prevent blood clots.  You may return to work once you are authorized by your doctor.     WEIGHT BEARING   Weight bearing as tolerated with assist device (walker, cane, etc) as directed, use it as long as suggested by your surgeon or therapist, typically at least 4-6 weeks.   EXERCISES  Results after joint replacement surgery are often greatly improved when you follow the exercise, range of motion and muscle strengthening exercises prescribed by your doctor. Safety measures are also important to protect the joint from further injury. Any time any of these exercises cause you to have increased pain or swelling, decrease what you are doing until you are comfortable again and then slowly increase them. If you have problems or questions, call your caregiver or physical therapist for advice.   Rehabilitation is important following a joint replacement. After just a few days of immobilization, the muscles of the leg can become weakened  and shrink (atrophy).  These exercises are designed to build up the tone and strength of the thigh and leg muscles and to improve motion. Often times heat used for twenty to thirty minutes before working out will loosen up your tissues and help with improving the range of motion but do not use heat for the first two weeks following surgery (sometimes heat can increase post-operative swelling).   These exercises can be done on a training (exercise) mat, on the floor, on a table or on a bed. Use whatever works the best and is most comfortable for you.    Use music or television while you are exercising so that the exercises are a pleasant break in your day. This will make your life better with the exercises acting as a break in your routine that you can look forward to.   Perform all exercises about fifteen times, three times per day or as directed.  You should exercise both the operative leg  and the other leg as well.   Exercises include:   Quad Sets - Tighten up the muscle on the front of the thigh (Quad) and hold for 5-10 seconds.   Straight Leg Raises - With your knee straight (if you were given a brace, keep it on), lift the leg to 60 degrees, hold for 3 seconds, and slowly lower the leg.  Perform this exercise against resistance later as your leg gets stronger.  Leg Slides: Lying on your back, slowly slide your foot toward your buttocks, bending your knee up off the floor (only go as far as is comfortable). Then slowly slide your foot back down until your leg is flat on the floor again.  Angel Wings: Lying on your back spread your legs to the side as far apart as you can without causing discomfort.  Hamstring Strength:  Lying on your back, push your heel against the floor with your leg straight by tightening up the muscles of your buttocks.  Repeat, but this time bend your knee to a comfortable angle, and push your heel against the floor.  You may put a pillow under the heel to make it more comfortable  if necessary.   A rehabilitation program following joint replacement surgery can speed recovery and prevent re-injury in the future due to weakened muscles. Contact your doctor or a physical therapist for more information on knee rehabilitation.    CONSTIPATION  Constipation is defined medically as fewer than three stools per week and severe constipation as less than one stool per week.  Even if you have a regular bowel pattern at home, your normal regimen is likely to be disrupted due to multiple reasons following surgery.  Combination of anesthesia, postoperative narcotics, change in appetite and fluid intake all can affect your bowels.   YOU MUST use at least one of the following options; they are listed in order of increasing strength to get the job done.  They are all available over the counter, and you may need to use some, POSSIBLY even all of these options:    Drink plenty of fluids (prune juice may be helpful) and high fiber foods Colace 100 mg by mouth twice a day  Senokot for constipation as directed and as needed Dulcolax (bisacodyl), take with full glass of water  Miralax (polyethylene glycol) once or twice a day as needed.  If you have tried all these things and are unable to have a bowel movement in the first 3-4 days after surgery call either your surgeon or your primary doctor.    If you experience loose stools or diarrhea, hold the medications until you stool forms back up.  If your symptoms do not get better within 1 week or if they get worse, check with your doctor.  If you experience "the worst abdominal pain ever" or develop nausea or vomiting, please contact the office immediately for further recommendations for treatment.   ITCHING:  If you experience itching with your medications, try taking only a single pain pill, or even half a pain pill at a time.  You can also use Benadryl over the counter for itching or also to help with sleep.   TED HOSE STOCKINGS:  Use  stockings on both legs until for at least 2 weeks or as directed by physician office. They may be removed at night for sleeping.  MEDICATIONS:  See your medication summary on the "After Visit Summary" that nursing will review with you.  You may have some home  medications which will be placed on hold until you complete the course of blood thinner medication.  It is important for you to complete the blood thinner medication as prescribed.  PRECAUTIONS:  If you experience chest pain or shortness of breath - call 911 immediately for transfer to the hospital emergency department.   If you develop a fever greater that 101 F, purulent drainage from wound, increased redness or drainage from wound, foul odor from the wound/dressing, or calf pain - CONTACT YOUR SURGEON.                                                   FOLLOW-UP APPOINTMENTS:  If you do not already have a post-op appointment, please call the office for an appointment to be seen by your surgeon.  Guidelines for how soon to be seen are listed in your "After Visit Summary", but are typically between 1-4 weeks after surgery.  OTHER INSTRUCTIONS:   Knee Replacement:  Do not place pillow under knee, focus on keeping the knee straight while resting. CPM instructions: 0-90 degrees, 2 hours in the morning, 2 hours in the afternoon, and 2 hours in the evening. Place foam block, curve side up under heel at all times except when in CPM or when walking.  DO NOT modify, tear, cut, or change the foam block in any way.  MAKE SURE YOU:  Understand these instructions.  Get help right away if you are not doing well or get worse.    Thank you for letting us be a part of your medical care team.  It is a privilege we respect greatly.  We hope these instructions will help you stay on track for a fast and full recovery!   Increase activity slowly as tolerated   Complete by:  As directed    Increase activity slowly as tolerated   Complete by:  As directed       Allergies as of 05/20/2018      Reactions   Ciprofloxacin Shortness Of Breath, Nausea And Vomiting, Nausea Only, Rash, Other (See Comments)   Marked mucous production   Dilaudid [hydromorphone Hcl] Shortness Of Breath, Nausea Only, Other (See Comments)   Patient developed rash, excessive mucous production and chest pain - tolerates morphine   Hydrocodone Itching      Medication List    STOP taking these medications   DUEXIS 800-26.6 MG Tabs Generic drug:  Ibuprofen-Famotidine     TAKE these medications   acetaminophen 500 MG tablet Commonly known as:  TYLENOL Take 1,000 mg by mouth every 6 (six) hours as needed.   aspirin EC 325 MG tablet Take 1 tablet (325 mg total) by mouth 2 (two) times daily.   methocarbamol 500 MG tablet Commonly known as:  ROBAXIN Take 1 tablet (500 mg total) by mouth every 8 (eight) hours as needed for muscle spasms.   oxyCODONE 5 MG immediate release tablet Commonly known as:  Oxy IR/ROXICODONE Take 1 tablet (5 mg total) by mouth every 4 (four) hours as needed for up to 7 days.   rosuvastatin 20 MG tablet Commonly known as:  CRESTOR Take 1 tablet (20 mg total) by mouth daily. What changed:  when to take this      Follow-up Information    [facility] Follow up.   Specialty:  Home Health Services Why:  [facility] will call to arrange initial visit Contact information: [address] [place] [address] [phone]           Signed: Mohammed Kindle Office:  [phone]

## 2018-05-20 NOTE — Progress Notes (Signed)
Subjective: 2 Days Post-Op Procedure(s) (LRB): TOTAL KNEE ARTHROPLASTY (Right) URETERAL DILITATION with foley insertion (N/A)  Patient reports pain as mild to moderate.  Tolerating POs well.  Admits to flatus.  Denies fever, chills, N/V, CP, SOB.  Spouse at bedside.   Reports that he is ready to go home.  Objective:   VITALS:  Temp:  [98.3 F (36.8 C)-98.7 F (37.1 C)] 98.3 F (36.8 C) (12/08 0503) Pulse Rate:  [70-94] 70 (12/08 0503) Resp:  [12-17] 16 (12/08 0503) BP: (128-148)/(75-90) 148/90 (12/08 0503) SpO2:  [95 %-99 %] 95 % (12/08 0503)  General: WDWN patient in NAD. Psych:  Appropriate mood and affect. Neuro:  A&O x 3, Moving all extremities, sensation intact to light touch HEENT:  EOMs intact Chest:  Even non-labored respirations Skin:  Dressing C/D/I, no rashes or lesions Extremities: warm/dry, mild edema, no visible erythema or echymosis.  No lymphadenopathy. Pulses: Popliteus 2+ MSK:  ROM: EHL/FHL intact, MMT: able to perform quad set, (-) Homan's    LABS Recent Labs    05/19/18 0336 05/20/18 0434  HGB 13.5 12.4*  WBC 12.8* 10.8*  PLT 179 178   Recent Labs    05/19/18 0336  NA 137  K 4.3  CL 106  CO2 23  BUN 17  CREATININE 0.96  GLUCOSE 178*   No results for input(s): LABPT, INR in the last 72 hours.   Assessment/Plan: 2 Days Post-Op Procedure(s) (LRB): TOTAL KNEE ARTHROPLASTY (Right) URETERAL DILITATION with foley insertion (N/A)  Patient seen in rounds for Dr. Theda Sers Up with therapy WBAT R LE D/C home Scripts on chart. Plan on outpatient post-op visit with Dr. Theda Sers.  Mechele Claude PA-C [facility] Office:  [phone]

## 2018-05-20 NOTE — Progress Notes (Signed)
Physical Therapy Treatment Patient Details Name: Harry Carroll MRN: [id number] DOB: [birth year] Today's Date: 05/20/2018    History of Present Illness Pt s/p R TKR and with hx of L TKR 7/19 and bipolar    PT Comments    Pt motivated and progressing well with mobility but with increased pain this date.  Pt eager for dc home   Follow Up Recommendations  Follow surgeon's recommendation for DC plan and follow-up therapies     Equipment Recommendations  None recommended by PT    Recommendations for Other Services       Precautions / Restrictions Precautions Precautions: Knee;Fall Required Braces or Orthoses: Knee Immobilizer - Right Knee Immobilizer - Right: Discontinue once straight leg raise with < 10 degree lag Restrictions Weight Bearing Restrictions: No Other Position/Activity Restrictions: WBAT    Mobility  Bed Mobility               General bed mobility comments: NT - pt up in chair and requests back to same  Transfers Overall transfer level: Needs assistance Equipment used: Rolling walker (2 wheeled) Transfers: Sit to/from Stand Sit to Stand: Supervision         General transfer comment: cues for LE management and use of UEs to self assist  Ambulation/Gait Ambulation/Gait assistance: Supervision Gait Distance (Feet): 200 Feet Assistive device: Rolling walker (2 wheeled) Gait Pattern/deviations: Step-to pattern;Decreased step length - right;Decreased step length - left;Shuffle     General Gait Details: min cues for position from [employer]             Wheelchair Mobility    Modified Rankin (Stroke Patients Only)       Balance Overall balance assessment: Mild deficits observed, not formally tested                                          Cognition Arousal/Alertness: Awake/alert Behavior During Therapy: WFL for tasks assessed/performed Overall Cognitive Status: Within Functional Limits for tasks assessed                                         Exercises Total Joint Exercises Ankle Circles/Pumps: AROM;Both;15 reps;Supine Quad Sets: AROM;Both;Supine;15 reps Heel Slides: AAROM;Right;Supine;15 reps Straight Leg Raises: AAROM;AROM;Right;Supine;20 reps Goniometric ROM: AAROM R knee -10 - 70 - pain limited    General Comments        Pertinent Vitals/Pain Pain Assessment: 0-10 Pain Score: 6  Pain Location: R knee Pain Descriptors / Indicators: Aching;Sore Pain Intervention(s): Limited activity within patient's tolerance;Monitored during session;Premedicated before session;Ice applied    Home Living                      Prior Function            PT Goals (current goals can now be found in the care plan section) Acute Rehab PT Goals Patient Stated Goal: Regain IND PT Goal Formulation: With patient Time For Goal Achievement: 05/25/18 Potential to Achieve Goals: Good Progress towards PT goals: Progressing toward goals    Frequency    7X/week      PT Plan Current plan remains appropriate    Co-evaluation              AM-PAC PT "6 Clicks" Mobility  Outcome Measure  Help needed turning from your back to your side while in a flat bed without using bedrails?: None Help needed moving from lying on your back to sitting on the side of a flat bed without using bedrails?: None Help needed moving to and from a bed to a chair (including a wheelchair)?: None Help needed standing up from a chair using your arms (e.g., wheelchair or bedside chair)?: None Help needed to walk in hospital room?: None Help needed climbing 3-5 steps with a railing? : A Little 6 Click Score: 23    End of Session Equipment Utilized During Treatment: Gait belt Activity Tolerance: Patient tolerated treatment well Patient left: in chair;with call bell/phone within reach;with family/visitor present Nurse Communication: Mobility status PT Visit Diagnosis: Difficulty in walking, not  elsewhere classified (R26.2)     Time: 6759-1638 PT Time Calculation (min) (ACUTE ONLY): 29 min  Charges:  $Gait Training: 8-22 mins $Therapeutic Exercise: 8-22 mins                     [place] Pager [phone] Office [phone]    Bradlee Heitman 05/20/2018, 12:48 PM

## 2018-05-21 ENCOUNTER — Telehealth: Payer: Self-pay | Admitting: *Deleted

## 2018-05-21 ENCOUNTER — Encounter (HOSPITAL_COMMUNITY): Payer: Self-pay | Admitting: Specialist

## 2018-05-21 NOTE — Addendum Note (Signed)
Addendum  created 05/21/18 1207 by Brennan Bailey, MD   Intraprocedure Blocks edited, Sign clinical note

## 2018-05-21 NOTE — Telephone Encounter (Signed)
Pt was on TCM report admitted 05/18/18 for R knee osteoarthritis. Pt proceeded to have TKR (total knee replacement) using cement. Pt tolerated procedure well and was d/c 05/20/18. Pt will follow-up w/specialist in 2 weeks.Marland KitchenJohny Chess

## 2018-05-22 DIAGNOSIS — G4733 Obstructive sleep apnea (adult) (pediatric): Secondary | ICD-10-CM | POA: Diagnosis not present

## 2018-05-22 DIAGNOSIS — Z471 Aftercare following joint replacement surgery: Secondary | ICD-10-CM | POA: Diagnosis not present

## 2018-05-23 DIAGNOSIS — Z471 Aftercare following joint replacement surgery: Secondary | ICD-10-CM | POA: Diagnosis not present

## 2018-05-23 DIAGNOSIS — G4733 Obstructive sleep apnea (adult) (pediatric): Secondary | ICD-10-CM | POA: Diagnosis not present

## 2018-05-25 DIAGNOSIS — Z471 Aftercare following joint replacement surgery: Secondary | ICD-10-CM | POA: Diagnosis not present

## 2018-05-25 DIAGNOSIS — G4733 Obstructive sleep apnea (adult) (pediatric): Secondary | ICD-10-CM | POA: Diagnosis not present

## 2018-05-28 DIAGNOSIS — G4733 Obstructive sleep apnea (adult) (pediatric): Secondary | ICD-10-CM | POA: Diagnosis not present

## 2018-05-28 DIAGNOSIS — Z471 Aftercare following joint replacement surgery: Secondary | ICD-10-CM | POA: Diagnosis not present

## 2018-05-30 DIAGNOSIS — Z471 Aftercare following joint replacement surgery: Secondary | ICD-10-CM | POA: Diagnosis not present

## 2018-05-30 DIAGNOSIS — G4733 Obstructive sleep apnea (adult) (pediatric): Secondary | ICD-10-CM | POA: Diagnosis not present

## 2018-05-31 DIAGNOSIS — Z471 Aftercare following joint replacement surgery: Secondary | ICD-10-CM | POA: Diagnosis not present

## 2018-05-31 DIAGNOSIS — G4733 Obstructive sleep apnea (adult) (pediatric): Secondary | ICD-10-CM | POA: Diagnosis not present

## 2018-06-01 DIAGNOSIS — Z471 Aftercare following joint replacement surgery: Secondary | ICD-10-CM | POA: Diagnosis not present

## 2018-06-01 DIAGNOSIS — Z96651 Presence of right artificial knee joint: Secondary | ICD-10-CM | POA: Diagnosis not present

## 2018-06-11 DIAGNOSIS — M25561 Pain in right knee: Secondary | ICD-10-CM | POA: Diagnosis not present

## 2018-06-14 DIAGNOSIS — M25561 Pain in right knee: Secondary | ICD-10-CM | POA: Diagnosis not present

## 2018-06-14 NOTE — Progress Notes (Signed)
HPI: FU CP. Echocardiogram August 2010 showed normal LV systolic function, mild biatrial enlargement and mild right ventricular enlargement. Carotid Dopplers 12/18 showed 1-39% bilateral stenosis. Stress echo 12/18 technically difficult but felt to likely be normal. Since last seen, there is no dyspnea, chest pain, palpitations or syncope.  Current Outpatient Medications  Medication Sig Dispense Refill  . acetaminophen (TYLENOL) 500 MG tablet Take 1,000 mg by mouth every 6 (six) hours as needed.    Marland Kitchen aspirin EC 325 MG tablet Take 1 tablet (325 mg total) by mouth 2 (two) times daily. 60 tablet 0  . methocarbamol (ROBAXIN) 500 MG tablet Take 1 tablet (500 mg total) by mouth every 8 (eight) hours as needed for muscle spasms. 50 tablet 0  . rosuvastatin (CRESTOR) 20 MG tablet Take 1 tablet (20 mg total) by mouth daily. (Patient taking differently: Take 20 mg by mouth every evening. ) 90 tablet 3   No current facility-administered medications for this visit.      Past Medical History:  Diagnosis Date  . Arthritis   . Bipolar 1 disorder (HCC)    Dr Clovis Pu , pt denies  . Carotid stenosis   . Carpal tunnel syndrome    saw Dr Kristie Cowman , pt unaware  . Cervical spinal stenosis   . Colon polyps    adenomatous  . Complication of anesthesia    trouble placing catheter with last surgeery, had to get urologist  . Diverticulitis   . Diverticulosis   . Heart murmur    pt denies  . History of hiatal hernia    resolved  . Hyperlipemia   . Insomnia    related to pain  . Normal cardiac stress test    CP 01-2009---Nl ECHO and stress test neg  . Numbness of fingers of both hands    when sleeps, right hand worse than left  . PVC (premature ventricular contraction)    history of PVC noted while in [employer]  . Restless leg   . Sleep apnea    does not use cpap due to does not like    Past Surgical History:  Procedure Laterality Date  . COLONOSCOPY WITH PROPOFOL N/A 04/02/2014   Procedure: COLONOSCOPY WITH PROPOFOL;  Surgeon: Inda Castle, MD;  Location: [facility];  Service: Endoscopy;  Laterality: N/A;  . CYSTOSCOPY WITH URETHRAL DILATATION  12/20/2011   Procedure: CYSTOSCOPY WITH URETHRAL DILATATION;  Surgeon: Earnstine Regal, MD;  Location: [facility];  Service: General;;  with foley insertion  . CYSTOSCOPY WITH URETHRAL DILATATION  12/20/2011   Procedure: CYSTOSCOPY WITH URETHRAL DILATATION;  Surgeon: Franchot Gallo, MD;  Location: [facility];  Service: Urology;;  . Consuela Mimes WITH URETHRAL DILATATION N/A 12/15/2017   Procedure: CYSTOSCOPY WITH URETHRAL DILATATION;  Surgeon: Irine Seal, MD;  Location: [facility];  Service: Urology;  Laterality: N/A;  . HOT HEMOSTASIS N/A 04/02/2014   Procedure: HOT HEMOSTASIS (ARGON PLASMA COAGULATION/BICAP);  Surgeon: Inda Castle, MD;  Location: [facility];  Service: Endoscopy;  Laterality: N/A;  . maxillofacial surgery    . PARTIAL COLECTOMY  12/20/2011   Procedure: PARTIAL COLECTOMY;  Surgeon: Earnstine Regal, MD;  Location: [facility];  Service: General;  Laterality: N/A;  Sigmoid colectomy  . right knee arthroscopy     2007 x2  . right shoulder surgery     X2  . SHOULDER SURGERY     LEFT  . TOTAL KNEE ARTHROPLASTY Left 12/15/2017   Procedure: LEFT TOTAL KNEE ARTHROPLASTY;  Surgeon: Sydnee Cabal, MD;  Location: [facility];  Service: Orthopedics;  Laterality: Left;  Adductor Block  . TOTAL KNEE ARTHROPLASTY Right 05/18/2018   Procedure: TOTAL KNEE ARTHROPLASTY;  Surgeon: Sydnee Cabal, MD;  Location: [facility];  Service: Orthopedics;  Laterality: Right;  with block    Social History   Socioeconomic History  . Marital status: Married    Spouse name: Not on file  . Number of children: 4  . Years of education: 40  . Highest education level: Not on file  Occupational History  . Occupation: Health visitor: [employer]  . Financial resource strain: Not on file  . Food insecurity:    Worry: Not on file     Inability: Not on file  . Transportation needs:    Medical: Not on file    Non-medical: Not on file  Tobacco Use  . Smoking status: Current Some Day Smoker    Types: Cigars  . Smokeless tobacco: Current User    Types: Snuff  . Tobacco comment: 1 per week  Substance and Sexual Activity  . Alcohol use: Yes    Comment:  a couple beers a few times a week   . Drug use: No    Comment: denies THC, cocainse, stimulants, pain meds, benzos, synthetic drugs  . Sexual activity: Never  Lifestyle  . Physical activity:    Days per week: Not on file    Minutes per session: Not on file  . Stress: Not on file  Relationships  . Social connections:    Talks on phone: Not on file    Gets together: Not on file    Attends religious service: Not on file    Active member of club or organization: Not on file    Attends meetings of clubs or organizations: Not on file    Relationship status: Not on file  . Intimate partner violence:    Fear of current or ex partner: Not on file    Emotionally abused: Not on file    Physically abused: Not on file    Forced sexual activity: Not on file  Other Topics Concern  . Not on file  Social History Narrative   Born in [place] and grew up in [place]. Currently resides in a house with his wife. 1 dog, 1 snake, 1 cat. 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    Denies religious beliefs that would effect health care.     Family History  Problem Relation Age of Onset  . Prostate cancer Paternal Grandfather   . Cancer Paternal Aunt        lymph cancer  . Alcohol abuse Father   . Alcohol abuse Sister   . Colon cancer Neg Hx   . CAD Neg Hx   . Pancreatic cancer Neg Hx   . Rectal cancer Neg Hx   . Stomach cancer Neg Hx   . Drug abuse Neg Hx   . Anxiety disorder Neg Hx   . Bipolar disorder Neg Hx   . Depression Neg Hx     ROS: no fevers or chills, productive cough, hemoptysis, dysphasia, odynophagia, melena, hematochezia, dysuria, hematuria, rash, seizure  activity, orthopnea, PND, pedal edema, claudication. Remaining systems are negative.  Physical Exam: Well-developed well-nourished in no acute distress.  Skin is warm and dry.  HEENT is normal.  Neck is supple.  Chest is clear to auscultation with normal expansion.  Cardiovascular exam is regular rate and rhythm.  Abdominal exam nontender or distended. No masses palpated. Extremities show no edema. neuro grossly intact  ECG-May 07, 2018-sinus rhythm, left ventricular hypertrophy.  Personally reviewed  A/P  1 carotid artery disease-mild on most recent carotid Dopplers.  Continue aspirin and statin.  2 hyperlipidemia-continue statin.  Check lipids and liver.  3 tobacco abuse-he occasionally smokes cigars.  I discussed importance of avoiding.  Kirk Ruths, MD

## 2018-06-18 DIAGNOSIS — M25561 Pain in right knee: Secondary | ICD-10-CM | POA: Diagnosis not present

## 2018-06-20 DIAGNOSIS — M25561 Pain in right knee: Secondary | ICD-10-CM | POA: Diagnosis not present

## 2018-06-22 DIAGNOSIS — M25561 Pain in right knee: Secondary | ICD-10-CM | POA: Diagnosis not present

## 2018-06-25 ENCOUNTER — Encounter: Payer: Self-pay | Admitting: Cardiology

## 2018-06-25 ENCOUNTER — Ambulatory Visit: Payer: 59 | Admitting: Cardiology

## 2018-06-25 VITALS — BP 118/66 | HR 89 | Ht 69.0 in | Wt 253.0 lb

## 2018-06-25 DIAGNOSIS — E7800 Pure hypercholesterolemia, unspecified: Secondary | ICD-10-CM | POA: Diagnosis not present

## 2018-06-25 DIAGNOSIS — I6523 Occlusion and stenosis of bilateral carotid arteries: Secondary | ICD-10-CM | POA: Diagnosis not present

## 2018-06-25 DIAGNOSIS — M25561 Pain in right knee: Secondary | ICD-10-CM | POA: Diagnosis not present

## 2018-06-25 NOTE — Patient Instructions (Signed)
Medication Instructions:  NO CHANGE If you need a refill on your cardiac medications before your next appointment, please call your pharmacy.   Lab work: Your physician recommends that you return for lab work PRIOR TO EATING If you have labs (blood work) drawn today and your tests are completely normal, you will receive your results only by: . MyChart Message (if you have MyChart) OR . A paper copy in the mail If you have any lab test that is abnormal or we need to change your treatment, we will call you to review the results.  Follow-Up: At [facility], you and your health needs are our priority.  As part of our continuing mission to provide you with exceptional heart care, we have created designated Provider Care Teams.  These Care Teams include your primary Cardiologist (physician) and Advanced Practice Providers (APPs -  Physician Assistants and Nurse Practitioners) who all work together to provide you with the care you need, when you need it. You will need a follow up appointment in 12 months.  Please call our office 2 months in advance to schedule this appointment.  You may see Brian Crenshaw, MD or one of the following Advanced Practice Providers on your designated Care Team:   Luke Kilroy, PA-C Krista Kroeger, PA-C . Callie Goodrich, PA-C     

## 2018-06-27 DIAGNOSIS — M25561 Pain in right knee: Secondary | ICD-10-CM | POA: Diagnosis not present

## 2018-06-29 DIAGNOSIS — M25561 Pain in right knee: Secondary | ICD-10-CM | POA: Diagnosis not present

## 2018-07-04 DIAGNOSIS — M25561 Pain in right knee: Secondary | ICD-10-CM | POA: Diagnosis not present

## 2018-07-09 DIAGNOSIS — M25561 Pain in right knee: Secondary | ICD-10-CM | POA: Diagnosis not present

## 2018-07-11 DIAGNOSIS — M25561 Pain in right knee: Secondary | ICD-10-CM | POA: Diagnosis not present

## 2018-07-13 DIAGNOSIS — M25561 Pain in right knee: Secondary | ICD-10-CM | POA: Diagnosis not present

## 2018-07-24 DIAGNOSIS — M25561 Pain in right knee: Secondary | ICD-10-CM | POA: Diagnosis not present

## 2018-07-27 DIAGNOSIS — M25561 Pain in right knee: Secondary | ICD-10-CM | POA: Diagnosis not present

## 2018-07-30 DIAGNOSIS — M25561 Pain in right knee: Secondary | ICD-10-CM | POA: Diagnosis not present

## 2018-07-31 ENCOUNTER — Ambulatory Visit: Payer: 59 | Admitting: Internal Medicine

## 2018-07-31 ENCOUNTER — Encounter: Payer: Self-pay | Admitting: Internal Medicine

## 2018-07-31 VITALS — BP 138/84 | HR 95 | Temp 97.9°F | Ht 69.0 in | Wt 261.0 lb

## 2018-07-31 DIAGNOSIS — M654 Radial styloid tenosynovitis [de Quervain]: Secondary | ICD-10-CM | POA: Diagnosis not present

## 2018-07-31 DIAGNOSIS — Z0000 Encounter for general adult medical examination without abnormal findings: Secondary | ICD-10-CM

## 2018-07-31 NOTE — Patient Instructions (Signed)
Today by exam you have:  De Quervain's tenosynovitis, right  OK to take advil as needed  Please make appt with Sports Medicine in this office for a cortisone shot  Please continue all other medications as before, and refills have been done if requested.  Please have the pharmacy call with any other refills you may need.  Please continue your efforts at being more active, low cholesterol diet, and weight control.  You are otherwise up to date with prevention measures today.  Please keep your appointments with your specialists as you may have planned  Please go to the LAB in the Basement (turn left off the elevator) for the tests to be done at your convenience  You will be contacted by phone if any changes need to be made immediately.  Otherwise, you will receive a letter about your results with an explanation, but please check with MyChart first.  Please remember to sign up for MyChart if you have not done so, as this will be important to you in the future with finding out test results, communicating by private email, and scheduling acute appointments online when needed.  Please return in 1 year for your yearly visit, or sooner if needed, with Lab testing done 3-5 days before

## 2018-07-31 NOTE — Assessment & Plan Note (Signed)
For advil prn, to see Dr Tamala Julian sports med for cortisone., avoid mouse trackball use if possible

## 2018-07-31 NOTE — Assessment & Plan Note (Signed)

## 2018-07-31 NOTE — Progress Notes (Signed)
Subjective:    Patient ID: Harry Carroll, male    DOB: 1961-01-19, 58 y.o.   MRN: [id number]  HPI  Here for wellness and f/u;  Overall doing ok;  Pt denies Chest pain, worsening SOB, DOE, wheezing, orthopnea, PND, worsening LE edema, palpitations, dizziness or syncope.  Pt denies neurological change such as new headache, facial or extremity weakness.  Pt denies polydipsia, polyuria, or low sugar symptoms. Pt states overall good compliance with treatment and medications, good tolerability, and has been trying to follow appropriate diet.  Pt denies worsening depressive symptoms, suicidal ideation or panic. No fever, night sweats, wt loss, loss of appetite, or other constitutional symptoms.  Pt states good ability with ADL's, has low fall risk, home safety reviewed and adequate, no other significant changes in hearing or vision, and only occasionally active with exercise.  S/p left knee TKR July 2019, then right knee TKR dec 2019.  Saw Dr Stanford Breed cards  Also right arm soreness x2-3 wks, severe with thumb use and grasping, "steel rod like", worse to make the hand move side to side, nothing makes better, uses as trackball mouse on computer for most of the work hours.  Plans to move on from current job in about 1 yr.   Past Medical History:  Diagnosis Date  . Arthritis   . Bipolar 1 disorder (HCC)    Dr Clovis Pu , pt denies  . Carotid stenosis   . Carpal tunnel syndrome    saw Dr Kristie Cowman , pt unaware  . Cervical spinal stenosis   . Colon polyps    adenomatous  . Complication of anesthesia    trouble placing catheter with last surgeery, had to get urologist  . Diverticulitis   . Diverticulosis   . Heart murmur    pt denies  . History of hiatal hernia    resolved  . Hyperlipemia   . Insomnia    related to pain  . Normal cardiac stress test    CP 01-2009---Nl ECHO and stress test neg  . Numbness of fingers of both hands    when sleeps, right hand worse than left  . PVC (premature ventricular  contraction)    history of PVC noted while in [employer]  . Restless leg   . Sleep apnea    does not use cpap due to does not like   Past Surgical History:  Procedure Laterality Date  . COLONOSCOPY WITH PROPOFOL N/A 04/02/2014   Procedure: COLONOSCOPY WITH PROPOFOL;  Surgeon: Inda Castle, MD;  Location: [facility];  Service: Endoscopy;  Laterality: N/A;  . CYSTOSCOPY WITH URETHRAL DILATATION  12/20/2011   Procedure: CYSTOSCOPY WITH URETHRAL DILATATION;  Surgeon: Earnstine Regal, MD;  Location: [facility];  Service: General;;  with foley insertion  . CYSTOSCOPY WITH URETHRAL DILATATION  12/20/2011   Procedure: CYSTOSCOPY WITH URETHRAL DILATATION;  Surgeon: Franchot Gallo, MD;  Location: [facility];  Service: Urology;;  . Consuela Mimes WITH URETHRAL DILATATION N/A 12/15/2017   Procedure: CYSTOSCOPY WITH URETHRAL DILATATION;  Surgeon: Irine Seal, MD;  Location: [facility];  Service: Urology;  Laterality: N/A;  . HOT HEMOSTASIS N/A 04/02/2014   Procedure: HOT HEMOSTASIS (ARGON PLASMA COAGULATION/BICAP);  Surgeon: Inda Castle, MD;  Location: [facility];  Service: Endoscopy;  Laterality: N/A;  . maxillofacial surgery    . PARTIAL COLECTOMY  12/20/2011   Procedure: PARTIAL COLECTOMY;  Surgeon: Earnstine Regal, MD;  Location: [facility];  Service: General;  Laterality: N/A;  Sigmoid colectomy  .  right knee arthroscopy     2007 x2  . right shoulder surgery     X2  . SHOULDER SURGERY     LEFT  . TOTAL KNEE ARTHROPLASTY Left 12/15/2017   Procedure: LEFT TOTAL KNEE ARTHROPLASTY;  Surgeon: Sydnee Cabal, MD;  Location: [facility];  Service: Orthopedics;  Laterality: Left;  Adductor Block  . TOTAL KNEE ARTHROPLASTY Right 05/18/2018   Procedure: TOTAL KNEE ARTHROPLASTY;  Surgeon: Sydnee Cabal, MD;  Location: [facility];  Service: Orthopedics;  Laterality: Right;  with block    reports that he has been smoking cigars. His smokeless tobacco use includes snuff. He reports current alcohol use. He reports that he does not use  drugs. family history includes Alcohol abuse in his father and sister; Cancer in his paternal aunt; Prostate cancer in his paternal grandfather. Allergies  Allergen Reactions  . Ciprofloxacin Shortness Of Breath, Nausea And Vomiting, Nausea Only, Rash and Other (See Comments)    Marked mucous production  . Dilaudid [Hydromorphone Hcl] Shortness Of Breath, Nausea Only and Other (See Comments)    Patient developed rash, excessive mucous production and chest pain - tolerates morphine  . Hydrocodone Itching   Current Outpatient Medications on File Prior to Visit  Medication Sig Dispense Refill  . acetaminophen (TYLENOL) 500 MG tablet Take 1,000 mg by mouth every 6 (six) hours as needed.    . IBUPROFEN PO Take by mouth.    . OXYCODONE ER PO Take 5 mg by mouth as needed.    . rosuvastatin (CRESTOR) 20 MG tablet Take 1 tablet (20 mg total) by mouth daily. (Patient taking differently: Take 20 mg by mouth every evening. ) 90 tablet 3   No current facility-administered medications on file prior to visit.    Review of Systems Constitutional: Negative for other unusual diaphoresis, sweats, appetite or weight changes HENT: Negative for other worsening hearing loss, ear pain, facial swelling, mouth sores or neck stiffness.   Eyes: Negative for other worsening pain, redness or other visual disturbance.  Respiratory: Negative for other stridor or swelling Cardiovascular: Negative for other palpitations or other chest pain  Gastrointestinal: Negative for worsening diarrhea or loose stools, blood in stool, distention or other pain Genitourinary: Negative for hematuria, flank pain or other change in urine volume.  Musculoskeletal: Negative for myalgias or other joint swelling.  Skin: Negative for other color change, or other wound or worsening drainage.  Neurological: Negative for other syncope or numbness. Hematological: Negative for other adenopathy or swelling Psychiatric/Behavioral: Negative for  hallucinations, other worsening agitation, SI, self-injury, or new decreased concentration All other system neg per pt    Objective:   Physical Exam BP 138/84   Pulse 95   Temp 97.9 F (36.6 C) (Oral)   Ht 5\' 9"  (1.753 m)   Wt 261 lb (118.4 kg)   SpO2 100%   BMI 38.54 kg/m  VS noted,  Constitutional: Pt is oriented to person, place, and time. Appears well-developed and well-nourished, in no significant distress and comfortable Head: Normocephalic and atraumatic  Eyes: Conjunctivae and EOM are normal. Pupils are equal, round, and reactive to light Right Ear: External ear normal without discharge Left Ear: External ear normal without discharge Nose: Nose without discharge or deformity Mouth/Throat: Oropharynx is without other ulcerations and moist  Neck: Normal range of motion. Neck supple. No JVD present. No tracheal deviation present or significant neck LA or mass Cardiovascular: Normal rate, regular rhythm, normal heart sounds and intact distal pulses.   Pulmonary/Chest: WOB  normal and breath sounds without rales or wheezing  Abdominal: Soft. Bowel sounds are normal. NT. No HSM  Musculoskeletal: Normal range of motion. Exhibits no edema, has tenderness to [facility] with bony change but also tender 1st tendon to near the elbow Lymphadenopathy: Has no other cervical adenopathy.  Neurological: Pt is alert and oriented to person, place, and time. Pt has normal reflexes. No cranial nerve deficit. Motor grossly intact, Gait intact Skin: Skin is warm and dry. No rash noted or new ulcerations Psychiatric:  Has normal mood and affect. Behavior is normal without agitation No other exam findings Lab Results  Component Value Date   WBC 10.8 (H) 05/20/2018   HGB 12.4 (L) 05/20/2018   HCT 36.5 (L) 05/20/2018   PLT 178 05/20/2018   GLUCOSE 178 (H) 05/19/2018   CHOL 232 (H) 06/22/2017   TRIG 181.0 (H) 06/22/2017   HDL 45.60 06/22/2017   LDLDIRECT 172.9 08/31/2012   LDLCALC 150 (H) 06/22/2017    ALT 94 (H) 06/22/2017   AST 43 (H) 06/22/2017   NA 137 05/19/2018   K 4.3 05/19/2018   CL 106 05/19/2018   CREATININE 0.96 05/19/2018   BUN 17 05/19/2018   CO2 23 05/19/2018   TSH 2.51 06/22/2017   PSA 2.19 06/22/2017   INR 0.91 05/07/2018   HGBA1C 5.8 06/22/2017         Assessment & Plan:

## 2018-08-01 ENCOUNTER — Other Ambulatory Visit (INDEPENDENT_AMBULATORY_CARE_PROVIDER_SITE_OTHER): Payer: 59

## 2018-08-01 DIAGNOSIS — Z0000 Encounter for general adult medical examination without abnormal findings: Secondary | ICD-10-CM | POA: Diagnosis not present

## 2018-08-01 LAB — LIPID PANEL
Cholesterol: 148 mg/dL (ref 0–200)
HDL: 48.9 mg/dL (ref >39.00)
LDL Cholesterol: 68 mg/dL (ref 0–99)
NONHDL: 98.7
Total CHOL/HDL Ratio: 3
Triglycerides: 153 mg/dL — ABNORMAL HIGH (ref 0.0–149.0)
VLDL: 30.6 mg/dL (ref 0.0–40.0)

## 2018-08-01 LAB — HEPATIC FUNCTION PANEL
ALT: 39 U/L (ref 0–53)
AST: 27 U/L (ref 0–37)
Albumin: 4.6 g/dL (ref 3.5–5.2)
Alkaline Phosphatase: 51 U/L (ref 39–117)
Bilirubin, Direct: 0.2 mg/dL (ref 0.0–0.3)
Total Bilirubin: 0.9 mg/dL (ref 0.2–1.2)
Total Protein: 6.7 g/dL (ref 6.0–8.3)

## 2018-08-01 LAB — CBC WITH DIFFERENTIAL/PLATELET
Basophils Absolute: 0.1 10*3/uL (ref 0.0–0.1)
Basophils Relative: 1 % (ref 0.0–3.0)
Eosinophils Absolute: 0.1 10*3/uL (ref 0.0–0.7)
Eosinophils Relative: 2.6 % (ref 0.0–5.0)
HCT: 43.7 % (ref 39.0–52.0)
Hemoglobin: 15.4 g/dL (ref 13.0–17.0)
Lymphocytes Relative: 27.4 % (ref 12.0–46.0)
Lymphs Abs: 1.5 10*3/uL (ref 0.7–4.0)
MCHC: 35.3 g/dL (ref 30.0–36.0)
MCV: 90.1 fl (ref 78.0–100.0)
Monocytes Absolute: 0.3 10*3/uL (ref 0.1–1.0)
Monocytes Relative: 6.2 % (ref 3.0–12.0)
Neutro Abs: 3.4 10*3/uL (ref 1.4–7.7)
Neutrophils Relative %: 62.8 % (ref 43.0–77.0)
Platelets: 220 10*3/uL (ref 150.0–400.0)
RBC: 4.84 Mil/uL (ref 4.22–5.81)
RDW: 12.9 % (ref 11.5–15.5)
WBC: 5.4 10*3/uL (ref 4.0–10.5)

## 2018-08-01 LAB — URINALYSIS, ROUTINE W REFLEX MICROSCOPIC
Bilirubin Urine: NEGATIVE
Hgb urine dipstick: NEGATIVE
Ketones, ur: NEGATIVE
Leukocytes,Ua: NEGATIVE
Nitrite: NEGATIVE
Specific Gravity, Urine: 1.015 (ref 1.000–1.030)
Total Protein, Urine: NEGATIVE
UROBILINOGEN UA: 0.2 (ref 0.0–1.0)
Urine Glucose: NEGATIVE
pH: 6 (ref 5.0–8.0)

## 2018-08-01 LAB — BASIC METABOLIC PANEL
BUN: 17 mg/dL (ref 6–23)
CO2: 23 mEq/L (ref 19–32)
Calcium: 9.5 mg/dL (ref 8.4–10.5)
Chloride: 103 mEq/L (ref 96–112)
Creatinine, Ser: 0.97 mg/dL (ref 0.40–1.50)
GFR: 79.16 mL/min (ref >60.00)
GLUCOSE: 101 mg/dL — AB (ref 70–99)
POTASSIUM: 4.4 meq/L (ref 3.5–5.1)
Sodium: 137 mEq/L (ref 135–145)

## 2018-08-01 LAB — PSA: PSA: 1.26 ng/mL (ref 0.10–4.00)

## 2018-08-01 LAB — TSH: TSH: 2.22 u[IU]/mL (ref 0.35–4.50)

## 2018-08-03 ENCOUNTER — Ambulatory Visit: Payer: Self-pay | Admitting: Family Medicine

## 2018-08-03 ENCOUNTER — Ambulatory Visit: Payer: Self-pay

## 2018-08-03 ENCOUNTER — Ambulatory Visit: Payer: 59 | Admitting: Family Medicine

## 2018-08-03 ENCOUNTER — Encounter: Payer: Self-pay | Admitting: Family Medicine

## 2018-08-03 VITALS — BP 164/90 | HR 88 | Resp 16 | Ht 69.0 in | Wt 261.0 lb

## 2018-08-03 DIAGNOSIS — M654 Radial styloid tenosynovitis [de Quervain]: Secondary | ICD-10-CM | POA: Diagnosis not present

## 2018-08-03 DIAGNOSIS — M25561 Pain in right knee: Secondary | ICD-10-CM | POA: Diagnosis not present

## 2018-08-03 MED ORDER — DICLOFENAC SODIUM 2 % TD SOLN: 1.0000 "application " | Freq: Two times a day (BID) | TRANSDERMAL | 3 refills | Status: DC

## 2018-08-03 NOTE — Progress Notes (Signed)
Harry Carroll - 58 y.o. male MRN [id number]  Date of birth: [birth year]  SUBJECTIVE:  Including CC & ROS.  No chief complaint on file.   Harry Carroll is a 58 y.o. male that is presenting with right wrist pain.  He feels this over the radial aspect of the wrist with some extension into the thumb.  He is felt this for a few weeks.  He was diagnosed with de Quervain's.  He uses a tracker ball in his mouse which seems to exacerbate the symptoms.  Pain is intermittent in nature.  It is moderate to severe.  Denies any injury.  Nothing seems to be improving his symptoms..    Review of Systems  Constitutional: Negative for fever.  HENT: Negative for congestion.   Respiratory: Negative for cough.   Cardiovascular: Negative for chest pain.  Gastrointestinal: Negative for abdominal pain.  Musculoskeletal: Negative for gait problem.  Skin: Negative for color change.  Neurological: Negative for dizziness.  Hematological: Negative for adenopathy.  Psychiatric/Behavioral: Negative for agitation.    HISTORY: Past Medical, Surgical, Social, and Family History Reviewed & Updated per EMR.   Pertinent Historical Findings include:  Past Medical History:  Diagnosis Date  . Arthritis   . Bipolar 1 disorder (HCC)    Dr Clovis Pu , pt denies  . Carotid stenosis   . Carpal tunnel syndrome    saw Dr Kristie Cowman , pt unaware  . Cervical spinal stenosis   . Colon polyps    adenomatous  . Complication of anesthesia    trouble placing catheter with last surgeery, had to get urologist  . Diverticulitis   . Diverticulosis   . Heart murmur    pt denies  . History of hiatal hernia    resolved  . Hyperlipemia   . Insomnia    related to pain  . Normal cardiac stress test    CP 01-2009---Nl ECHO and stress test neg  . Numbness of fingers of both hands    when sleeps, right hand worse than left  . PVC (premature ventricular contraction)    history of PVC noted while in [employer]  . Restless leg   . Sleep  apnea    does not use cpap due to does not like    Past Surgical History:  Procedure Laterality Date  . COLONOSCOPY WITH PROPOFOL N/A 04/02/2014   Procedure: COLONOSCOPY WITH PROPOFOL;  Surgeon: Inda Castle, MD;  Location: [facility];  Service: Endoscopy;  Laterality: N/A;  . CYSTOSCOPY WITH URETHRAL DILATATION  12/20/2011   Procedure: CYSTOSCOPY WITH URETHRAL DILATATION;  Surgeon: Earnstine Regal, MD;  Location: [facility];  Service: General;;  with foley insertion  . CYSTOSCOPY WITH URETHRAL DILATATION  12/20/2011   Procedure: CYSTOSCOPY WITH URETHRAL DILATATION;  Surgeon: Franchot Gallo, MD;  Location: [facility];  Service: Urology;;  . Consuela Mimes WITH URETHRAL DILATATION N/A 12/15/2017   Procedure: CYSTOSCOPY WITH URETHRAL DILATATION;  Surgeon: Irine Seal, MD;  Location: [facility];  Service: Urology;  Laterality: N/A;  . HOT HEMOSTASIS N/A 04/02/2014   Procedure: HOT HEMOSTASIS (ARGON PLASMA COAGULATION/BICAP);  Surgeon: Inda Castle, MD;  Location: [facility];  Service: Endoscopy;  Laterality: N/A;  . maxillofacial surgery    . PARTIAL COLECTOMY  12/20/2011   Procedure: PARTIAL COLECTOMY;  Surgeon: Earnstine Regal, MD;  Location: [facility];  Service: General;  Laterality: N/A;  Sigmoid colectomy  . right knee arthroscopy     2007 x2  . right shoulder surgery  X2  . SHOULDER SURGERY     LEFT  . TOTAL KNEE ARTHROPLASTY Left 12/15/2017   Procedure: LEFT TOTAL KNEE ARTHROPLASTY;  Surgeon: Sydnee Cabal, MD;  Location: [facility];  Service: Orthopedics;  Laterality: Left;  Adductor Block  . TOTAL KNEE ARTHROPLASTY Right 05/18/2018   Procedure: TOTAL KNEE ARTHROPLASTY;  Surgeon: Sydnee Cabal, MD;  Location: [facility];  Service: Orthopedics;  Laterality: Right;  with block    Allergies  Allergen Reactions  . Ciprofloxacin Shortness Of Breath, Nausea And Vomiting, Nausea Only, Rash and Other (See Comments)    Marked mucous production  . Dilaudid [Hydromorphone Hcl] Shortness Of Breath, Nausea Only  and Other (See Comments)    Patient developed rash, excessive mucous production and chest pain - tolerates morphine  . Hydrocodone Itching    Family History  Problem Relation Age of Onset  . Prostate cancer Paternal Grandfather   . Cancer Paternal Aunt        lymph cancer  . Alcohol abuse Father   . Alcohol abuse Sister   . Colon cancer Neg Hx   . CAD Neg Hx   . Pancreatic cancer Neg Hx   . Rectal cancer Neg Hx   . Stomach cancer Neg Hx   . Drug abuse Neg Hx   . Anxiety disorder Neg Hx   . Bipolar disorder Neg Hx   . Depression Neg Hx      Social History   Socioeconomic History  . Marital status: Married    Spouse name: Not on file  . Number of children: 4  . Years of education: 49  . Highest education level: Not on file  Occupational History  . Occupation: Health visitor: [employer]  . Financial resource strain: Not on file  . Food insecurity:    Worry: Not on file    Inability: Not on file  . Transportation needs:    Medical: Not on file    Non-medical: Not on file  Tobacco Use  . Smoking status: Current Some Day Smoker    Types: Cigars  . Smokeless tobacco: Current User    Types: Snuff  . Tobacco comment: 1 per week  Substance and Sexual Activity  . Alcohol use: Yes    Comment:  a couple beers a few times a week   . Drug use: No    Comment: denies THC, cocainse, stimulants, pain meds, benzos, synthetic drugs  . Sexual activity: Never  Lifestyle  . Physical activity:    Days per week: Not on file    Minutes per session: Not on file  . Stress: Not on file  Relationships  . Social connections:    Talks on phone: Not on file    Gets together: Not on file    Attends religious service: Not on file    Active member of club or organization: Not on file    Attends meetings of clubs or organizations: Not on file    Relationship status: Not on file  . Intimate partner violence:    Fear of current or ex partner: Not on file     Emotionally abused: Not on file    Physically abused: Not on file    Forced sexual activity: Not on file  Other Topics Concern  . Not on file  Social History Narrative   Born in [place] and grew up in [place]. Currently resides in a house with his wife. 1 dog, 1 snake, 1 cat. 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    Denies religious beliefs that would effect health care.      PHYSICAL EXAM:  VS: BP (!) 164/90   Pulse 88   Resp 16   Ht 5\' 9"  (1.753 m)   Wt 261 lb (118.4 kg)   SpO2 98%   BMI 38.54 kg/m  Physical Exam Gen: NAD, alert, cooperative with exam, well-appearing ENT: normal lips, normal nasal mucosa,  Eye: normal EOM, normal conjunctiva and lids CV:  no edema, +2 pedal pulses   Resp: no accessory muscle use, non-labored,  Skin: no rashes, no areas of induration  Neuro: normal tone, normal sensation to touch Psych:  normal insight, alert and oriented MSK:  Right wrist/hand: No ecchymosis or swelling. Normal wrist range of motion. Pain with resistance to thumb hyperextension. Positive Finkelstein's test. Tenderness to palpation over the radial sided wrist. Normal grip strength. Neurovascularly intact  Limited ultrasound: Right wrist:  CMC joint with minimal to little no degenerative changes. First dorsal compartment with thickening and mucus changes of the tendons with mild effusion surrounding the tendons at the distal radius. Normal-appearing second dorsal compartment. No significant degenerative changes within the carpal bones  Summary: Findings suggestive de Quervain's tenosynovitis.  Ultrasound and interpretation by Clearance Coots, MD      ASSESSMENT & PLAN:   Harriet Pho tenosynovitis, right Seems to be exacerbated by using the tract while at his work.  Findings consistent on clinical exam and ultrasound. -Provided Pennsaid. -Placed in a thumb spica splint -Counseled on home exercise therapy and supportive care. -If no improvement can  consider injection and imaging.

## 2018-08-03 NOTE — Patient Instructions (Addendum)
Nice to meet you  Please try the pennsaid  Please try the brace and exercises  Please follow up with me in 3-4 weeks if no better.

## 2018-08-06 DIAGNOSIS — M25561 Pain in right knee: Secondary | ICD-10-CM | POA: Diagnosis not present

## 2018-08-06 NOTE — Assessment & Plan Note (Signed)
Seems to be exacerbated by using the tract while at his work.  Findings consistent on clinical exam and ultrasound. -Provided Pennsaid. -Placed in a thumb spica splint -Counseled on home exercise therapy and supportive care. -If no improvement can consider injection and imaging.

## 2018-08-10 DIAGNOSIS — M25561 Pain in right knee: Secondary | ICD-10-CM | POA: Diagnosis not present

## 2018-08-13 DIAGNOSIS — M25561 Pain in right knee: Secondary | ICD-10-CM | POA: Diagnosis not present

## 2018-08-13 DIAGNOSIS — Z96652 Presence of left artificial knee joint: Secondary | ICD-10-CM | POA: Diagnosis not present

## 2018-08-13 DIAGNOSIS — Z96653 Presence of artificial knee joint, bilateral: Secondary | ICD-10-CM | POA: Diagnosis not present

## 2018-08-13 DIAGNOSIS — Z471 Aftercare following joint replacement surgery: Secondary | ICD-10-CM | POA: Diagnosis not present

## 2018-08-21 DIAGNOSIS — M25531 Pain in right wrist: Secondary | ICD-10-CM | POA: Diagnosis not present

## 2018-08-21 DIAGNOSIS — M654 Radial styloid tenosynovitis [de Quervain]: Secondary | ICD-10-CM | POA: Diagnosis not present

## 2018-08-27 ENCOUNTER — Other Ambulatory Visit: Payer: Self-pay | Admitting: Internal Medicine

## 2018-10-02 DIAGNOSIS — M654 Radial styloid tenosynovitis [de Quervain]: Secondary | ICD-10-CM | POA: Diagnosis not present

## 2018-10-09 DIAGNOSIS — M654 Radial styloid tenosynovitis [de Quervain]: Secondary | ICD-10-CM | POA: Diagnosis not present

## 2018-12-10 ENCOUNTER — Telehealth: Payer: Self-pay | Admitting: *Deleted

## 2018-12-10 ENCOUNTER — Telehealth: Payer: Self-pay

## 2018-12-10 ENCOUNTER — Encounter: Payer: Self-pay | Admitting: Internal Medicine

## 2018-12-10 DIAGNOSIS — Z20822 Contact with and (suspected) exposure to covid-19: Secondary | ICD-10-CM

## 2018-12-10 NOTE — Telephone Encounter (Signed)
Patient scheduled at [facility] 12/11/2018 at 3:30 pm.  Testing protocol reviewed.

## 2018-12-10 NOTE — Telephone Encounter (Signed)
sihrron to refer for COVID testing please

## 2018-12-10 NOTE — Telephone Encounter (Signed)
Please schedule pt for covid19 testing per PCP. Thanks!

## 2018-12-10 NOTE — Telephone Encounter (Signed)
Scheduled patient for appointment at [facility] at 3:30 pm for COVID 19 test.  Testing protocol reviewed.

## 2018-12-11 ENCOUNTER — Other Ambulatory Visit: Payer: Self-pay

## 2018-12-11 DIAGNOSIS — Z20822 Contact with and (suspected) exposure to covid-19: Secondary | ICD-10-CM

## 2018-12-14 ENCOUNTER — Telehealth: Payer: Self-pay | Admitting: Internal Medicine

## 2018-12-14 ENCOUNTER — Encounter: Payer: Self-pay | Admitting: Internal Medicine

## 2018-12-14 NOTE — Telephone Encounter (Signed)
Patient is waiting for covid test, called to stating he is still feeling bad.  He does have a dry cough, no shortness of breath.  Patient has a fever from 6/26 (99.8-101) Patient wanted PCP to call in some type of medication.   Call back # [phone]

## 2018-12-17 ENCOUNTER — Encounter: Payer: Self-pay | Admitting: Internal Medicine

## 2018-12-17 ENCOUNTER — Encounter: Payer: Self-pay | Admitting: Family

## 2018-12-17 ENCOUNTER — Ambulatory Visit (INDEPENDENT_AMBULATORY_CARE_PROVIDER_SITE_OTHER): Payer: 59 | Admitting: Family

## 2018-12-17 ENCOUNTER — Telehealth: Payer: Self-pay

## 2018-12-17 DIAGNOSIS — Z20828 Contact with and (suspected) exposure to other viral communicable diseases: Secondary | ICD-10-CM

## 2018-12-17 DIAGNOSIS — Z20822 Contact with and (suspected) exposure to covid-19: Secondary | ICD-10-CM

## 2018-12-17 DIAGNOSIS — R6889 Other general symptoms and signs: Secondary | ICD-10-CM

## 2018-12-17 LAB — NOVEL CORONAVIRUS, NAA: SARS-CoV-2, NAA: DETECTED — AB

## 2018-12-17 NOTE — Progress Notes (Signed)
Harry Carroll is a 58 y.o. male with the following history as recorded in EpicCare:  Patient Active Problem List   Diagnosis Date Noted  . De Quervain's tenosynovitis, right 07/31/2018  . S/P knee replacement 05/18/2018  . 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 Hyperglycemia 06/22/2017  . Numbness and tingling in right hand 01/04/2017  . Low testosterone 10/03/2016  . Obesity 03/28/2016  . Bilateral wrist pain 12/25/2015  . Acute diverticulitis 10/21/2015  . Numbness and tingling of both upper extremities while sleeping 08/27/2015  . Routine general medical examination at a health care facility 03/24/2015  . Bipolar I disorder, most recent episode (or current) unspecified 02/26/2015  . Rib contusion 11/13/2014  . Ganglion cyst of wrist 06/26/2014  . History of colonic polyps 04/02/2014  . Diverticulitis of colon (without mention of hemorrhage)(562.11) 01/17/2014  . Chest pain 09/04/2013  . Abdominal pain, left lower quadrant 02/18/2013  . Dizziness 03/29/2012  . Diverticulitis-post resection July 2013 11/27/2011  . OSA (obstructive sleep apnea) 11/17/2011  . Fatigue 10/11/2011  . Personal history of colonic polyps 02/18/2011  . HYPERTRIGLYCERIDEMIA 02/20/2009  . BIPOLAR DISORDER UNSPECIFIED 11/29/2007  . INSOMNIA 11/29/2007  . LIVER FUNCTION TESTS, ABNORMAL, HX OF 11/29/2007  . DEPRESSION 07/07/2007  . Hyperlipidemia 04/16/2007    Current Outpatient Medications  Medication Sig Dispense Refill  . acetaminophen (TYLENOL) 500 MG tablet Take 1,000 mg by mouth every 6 (six) hours as needed.    . Diclofenac Sodium (PENNSAID) 2 % SOLN Place 1 application onto the skin 2 (two) times daily. 1 Bottle 3  . IBUPROFEN PO Take by mouth.    . OXYCODONE ER PO Take 5 mg by mouth as needed.    . rosuvastatin (CRESTOR) 20 MG tablet TAKE 1 TABLET BY MOUTH EVERY DAY 90 tablet 3   No current facility-administered medications for this visit.     Allergies:  Ciprofloxacin, Dilaudid [hydromorphone hcl], and Hydrocodone  Past Medical History:  Diagnosis Date  . Arthritis   . Bipolar 1 disorder (HCC)    Dr Clovis Pu , pt denies  . Carotid stenosis   . Carpal tunnel syndrome    saw Dr Kristie Cowman , pt unaware  . Cervical spinal stenosis   . Colon polyps    adenomatous  . Complication of anesthesia    trouble placing catheter with last surgeery, had to get urologist  . Diverticulitis   . Diverticulosis   . Heart murmur    pt denies  . History of hiatal hernia    resolved  . Hyperlipemia   . Insomnia    related to pain  . Normal cardiac stress test    CP 01-2009---Nl ECHO and stress test neg  . Numbness of fingers of both hands    when sleeps, right hand worse than left  . PVC (premature ventricular contraction)    history of PVC noted while in [employer]  . Restless leg   . Sleep apnea    does not use cpap due to does not like    Past Surgical History:  Procedure Laterality Date  . COLONOSCOPY WITH PROPOFOL N/A 04/02/2014   Procedure: COLONOSCOPY WITH PROPOFOL;  Surgeon: Inda Castle, MD;  Location: [facility];  Service: Endoscopy;  Laterality: N/A;  . CYSTOSCOPY WITH URETHRAL DILATATION  12/20/2011   Procedure: CYSTOSCOPY WITH URETHRAL DILATATION;  Surgeon: Earnstine Regal, MD;  Location: [facility];  Service: General;;  with foley insertion  . CYSTOSCOPY WITH URETHRAL DILATATION  12/20/2011   Procedure: CYSTOSCOPY WITH URETHRAL DILATATION;  Surgeon: Franchot Gallo, MD;  [facility];  Service: Urology;;  . Consuela Mimes WITH URETHRAL DILATATION N/A 12/15/2017   Procedure: CYSTOSCOPY WITH URETHRAL DILATATION;  Surgeon: Irine Seal, MD;  Location[facility];  Service: Urology;  Laterality: N/A;  . HOT HEMOSTASIS N/A 04/02/2014   Procedure: HOT HEMOSTASIS (ARGON PLASMA COAGULATION/BICAP);  Surgeon: Inda Castle, MD;  Location: [facility];  Service: Endoscopy;  Laterality: N/A;  . maxillofacial surgery    . PARTIAL COLECTOMY  12/20/2011    Procedure: PARTIAL COLECTOMY;  Surgeon: Earnstine Regal, MD;  Location: [facility];  Service: General;  Laterality: N/A;  Sigmoid colectomy  . right knee arthroscopy     2007 x2  . right shoulder surgery     X2  . SHOULDER SURGERY     LEFT  . TOTAL KNEE ARTHROPLASTY Left 12/15/2017   Procedure: LEFT TOTAL KNEE ARTHROPLASTY;  Surgeon: Sydnee Cabal, MD;  Location: [facility];  Service: Orthopedics;  Laterality: Left;  Adductor Block  . TOTAL KNEE ARTHROPLASTY Right 05/18/2018   Procedure: TOTAL KNEE ARTHROPLASTY;  Surgeon: Sydnee Cabal, MD;  Location: [facility];  Service: Orthopedics;  Laterality: Right;  with block    Family History  Problem Relation Age of Onset  . Prostate cancer Paternal Grandfather   . Cancer Paternal Aunt        lymph cancer  . Alcohol abuse Father   . Alcohol abuse Sister   . Colon cancer Neg Hx   . CAD Neg Hx   . Pancreatic cancer Neg Hx   . Rectal cancer Neg Hx   . Stomach cancer Neg Hx   . Drug abuse Neg Hx   . Anxiety disorder Neg Hx   . Bipolar disorder Neg Hx   . Depression Neg Hx     Social History   Tobacco Use  . Smoking status: Current Some Day Smoker    Types: Cigars  . Smokeless tobacco: Current User    Types: Snuff  . Tobacco comment: 1 per week  Substance Use Topics  . Alcohol use: Yes    Comment:  a couple beers a few times a week     Subjective:    I connected with Harry Carroll on 12/17/18 at  9:00 AM EDT by a video enabled telemedicine application and verified that I am speaking with the correct person using two identifiers.   I discussed the limitations of evaluation and management by telemedicine and the availability of in person appointments. The patient expressed understanding and agreed to proceed.  Patient was tested for COVID last week; is still awaiting his results; is requesting Z-pak in the interim "because he feels so bad." Denies any shortness of breath or difficulty breathing; has decreased sense of taste and smell/ body  aches; wife is sick with similar symptoms and currently awaiting her COVID tests as well; symptoms have been present x 9-10 days;     Objective:  There were no vitals filed for this visit.  General: Well developed, well nourished, in no acute distress  Head: Normocephalic and atraumatic  Lungs: Respirations unlabored;  Neurologic: Alert and oriented; speech intact; face symmetrical;   Assessment:  1. Suspected Covid-19 Virus Infection       Plan:  Do not feel that antibiotic is appropriate; test results are pending and need to get results before starting antibiotic; encouraged rest and fluids; strict ER precautions discussed; follow-up to be determined.   No  follow-ups on file.  No orders of the defined types were placed in this encounter.   Requested Prescriptions    No prescriptions requested or ordered in this encounter

## 2018-12-17 NOTE — Telephone Encounter (Signed)
Pt has viewed results via MyChart Pt has been conversing with PCP via [place] in regard.

## 2018-12-17 NOTE — Telephone Encounter (Signed)
Schedule patient an appointment this morning with Mickel Baas.

## 2018-12-19 NOTE — Telephone Encounter (Signed)
Patient's wife called following up on this message that was sent. Please advise.

## 2018-12-20 ENCOUNTER — Encounter (HOSPITAL_COMMUNITY): Payer: Self-pay

## 2018-12-20 ENCOUNTER — Emergency Department (HOSPITAL_COMMUNITY): Payer: 59

## 2018-12-20 ENCOUNTER — Inpatient Hospital Stay (HOSPITAL_COMMUNITY)
Admission: EM | Admit: 2018-12-20 | Discharge: 2018-12-26 | DRG: 177 | Disposition: A | Discharge status: Home / Self Care | Payer: 59 | Attending: Internal Medicine | Admitting: Internal Medicine

## 2018-12-20 ENCOUNTER — Other Ambulatory Visit: Payer: Self-pay

## 2018-12-20 DIAGNOSIS — Z8719 Personal history of other diseases of the digestive system: Secondary | ICD-10-CM | POA: Diagnosis not present

## 2018-12-20 DIAGNOSIS — Z79899 Other long term (current) drug therapy: Secondary | ICD-10-CM | POA: Diagnosis not present

## 2018-12-20 DIAGNOSIS — Z860100 Personal history of colon polyps, unspecified: Secondary | ICD-10-CM

## 2018-12-20 DIAGNOSIS — Z6837 Body mass index (BMI) 37.0-37.9, adult: Secondary | ICD-10-CM | POA: Diagnosis not present

## 2018-12-20 DIAGNOSIS — J189 Pneumonia, unspecified organism: Secondary | ICD-10-CM | POA: Diagnosis not present

## 2018-12-20 DIAGNOSIS — U071 COVID-19: Principal | ICD-10-CM | POA: Diagnosis present

## 2018-12-20 DIAGNOSIS — F17290 Nicotine dependence, other tobacco product, uncomplicated: Secondary | ICD-10-CM | POA: Diagnosis present

## 2018-12-20 DIAGNOSIS — R0602 Shortness of breath: Secondary | ICD-10-CM | POA: Diagnosis present

## 2018-12-20 DIAGNOSIS — E785 Hyperlipidemia, unspecified: Secondary | ICD-10-CM | POA: Diagnosis present

## 2018-12-20 DIAGNOSIS — Z8042 Family history of malignant neoplasm of prostate: Secondary | ICD-10-CM

## 2018-12-20 DIAGNOSIS — G4700 Insomnia, unspecified: Secondary | ICD-10-CM | POA: Diagnosis not present

## 2018-12-20 DIAGNOSIS — J9601 Acute respiratory failure with hypoxia: Secondary | ICD-10-CM | POA: Diagnosis present

## 2018-12-20 DIAGNOSIS — F32A Depression, unspecified: Secondary | ICD-10-CM | POA: Diagnosis present

## 2018-12-20 DIAGNOSIS — Z881 Allergy status to other antibiotic agents status: Secondary | ICD-10-CM | POA: Diagnosis not present

## 2018-12-20 DIAGNOSIS — F320 Major depressive disorder, single episode, mild: Secondary | ICD-10-CM | POA: Diagnosis not present

## 2018-12-20 DIAGNOSIS — J1289 Other viral pneumonia: Secondary | ICD-10-CM | POA: Diagnosis present

## 2018-12-20 DIAGNOSIS — G4733 Obstructive sleep apnea (adult) (pediatric): Secondary | ICD-10-CM | POA: Diagnosis present

## 2018-12-20 DIAGNOSIS — E669 Obesity, unspecified: Secondary | ICD-10-CM | POA: Diagnosis not present

## 2018-12-20 DIAGNOSIS — Z885 Allergy status to narcotic agent status: Secondary | ICD-10-CM

## 2018-12-20 DIAGNOSIS — M1990 Unspecified osteoarthritis, unspecified site: Secondary | ICD-10-CM | POA: Diagnosis present

## 2018-12-20 DIAGNOSIS — M4802 Spinal stenosis, cervical region: Secondary | ICD-10-CM | POA: Diagnosis present

## 2018-12-20 DIAGNOSIS — F329 Major depressive disorder, single episode, unspecified: Secondary | ICD-10-CM | POA: Diagnosis present

## 2018-12-20 DIAGNOSIS — F5101 Primary insomnia: Secondary | ICD-10-CM | POA: Diagnosis not present

## 2018-12-20 DIAGNOSIS — F319 Bipolar disorder, unspecified: Secondary | ICD-10-CM | POA: Diagnosis present

## 2018-12-20 DIAGNOSIS — G2581 Restless legs syndrome: Secondary | ICD-10-CM | POA: Diagnosis present

## 2018-12-20 DIAGNOSIS — Z807 Family history of other malignant neoplasms of lymphoid, hematopoietic and related tissues: Secondary | ICD-10-CM | POA: Diagnosis not present

## 2018-12-20 DIAGNOSIS — Z9049 Acquired absence of other specified parts of digestive tract: Secondary | ICD-10-CM

## 2018-12-20 DIAGNOSIS — F3111 Bipolar disorder, current episode manic without psychotic features, mild: Secondary | ICD-10-CM | POA: Diagnosis not present

## 2018-12-20 DIAGNOSIS — F310 Bipolar disorder, current episode hypomanic: Secondary | ICD-10-CM | POA: Diagnosis not present

## 2018-12-20 DIAGNOSIS — Z96653 Presence of artificial knee joint, bilateral: Secondary | ICD-10-CM | POA: Diagnosis not present

## 2018-12-20 LAB — CBC WITH DIFFERENTIAL/PLATELET
Abs Immature Granulocytes: 0 10*3/uL (ref 0.00–0.07)
Basophils Absolute: 0.1 10*3/uL (ref 0.0–0.1)
Basophils Relative: 1 %
Eosinophils Absolute: 0.1 10*3/uL (ref 0.0–0.5)
Eosinophils Relative: 1 %
HCT: 43.9 % (ref 39.0–52.0)
Hemoglobin: 15.2 g/dL (ref 13.0–17.0)
Lymphocytes Relative: 7 %
Lymphs Abs: 0.6 10*3/uL — ABNORMAL LOW (ref 0.7–4.0)
MCH: 31.3 pg (ref 26.0–34.0)
MCHC: 34.6 g/dL (ref 30.0–36.0)
MCV: 90.3 fL (ref 80.0–100.0)
Monocytes Absolute: 0.3 10*3/uL (ref 0.1–1.0)
Monocytes Relative: 4 %
Neutro Abs: 7 10*3/uL (ref 1.7–7.7)
Neutrophils Relative %: 87 %
Platelets: 233 10*3/uL (ref 150–400)
RBC: 4.86 MIL/uL (ref 4.22–5.81)
RDW: 13.1 % (ref 11.5–15.5)
WBC: 8 10*3/uL (ref 4.0–10.5)
nRBC: 0 % (ref 0.0–0.2)
nRBC: 0 /100 WBC

## 2018-12-20 LAB — BASIC METABOLIC PANEL
Anion gap: 13 (ref 5–15)
BUN: 17 mg/dL (ref 6–20)
CO2: 22 mmol/L (ref 22–32)
Calcium: 9.1 mg/dL (ref 8.9–10.3)
Chloride: 98 mmol/L (ref 98–111)
Creatinine, Ser: 1.02 mg/dL (ref 0.61–1.24)
GFR calc Af Amer: >60 mL/min (ref >60)
GFR calc non Af Amer: >60 mL/min (ref >60)
Glucose, Bld: 93 mg/dL (ref 70–99)
Potassium: 3.8 mmol/L (ref 3.5–5.1)
Sodium: 133 mmol/L — ABNORMAL LOW (ref 135–145)

## 2018-12-20 LAB — LACTIC ACID, PLASMA: Lactic Acid, Venous: 1.6 mmol/L (ref 0.5–1.9)

## 2018-12-20 MED ORDER — ALBUTEROL SULFATE HFA 108 (90 BASE) MCG/ACT IN AERS
2.0000 | INHALATION_SPRAY | Freq: Once | RESPIRATORY_TRACT | Status: AC
  Administered 2018-12-20: 23:00:00 2 via RESPIRATORY_TRACT
  Filled 2018-12-20: qty 6.7

## 2018-12-20 MED ORDER — ACETAMINOPHEN 325 MG PO TABS
650.0000 mg | ORAL_TABLET | Freq: Once | ORAL | Status: AC
  Administered 2018-12-20: 650 mg via ORAL
  Filled 2018-12-20: qty 2

## 2018-12-20 MED ORDER — SODIUM CHLORIDE 0.9 % IV SOLN
1.0000 g | Freq: Once | INTRAVENOUS | Status: AC
  Administered 2018-12-20: 1 g via INTRAVENOUS
  Filled 2018-12-20: qty 10

## 2018-12-20 MED ORDER — SODIUM CHLORIDE 0.9 % IV SOLN
500.0000 mg | Freq: Once | INTRAVENOUS | Status: AC
  Administered 2018-12-21: 500 mg via INTRAVENOUS
  Filled 2018-12-20: qty 500

## 2018-12-20 NOTE — ED Notes (Signed)
Pt ambulated around the room for approx 45 seconds. Oxygen saturations dropped to 88% when he sat down on the stretcher. Pt had to be placed on 2L [place] to increase saturation to 93%.

## 2018-12-20 NOTE — ED Notes (Signed)
Pt placed on 2L [place]

## 2018-12-20 NOTE — ED Triage Notes (Signed)
Pt reports he was COVID + 13 days ago, c.o fever, cough, diarrhea which has all improved but his SOB has gotten worse. His PCP recommended he come here for blood work and chest xray. Pt 88% on room air, 2L [place] applied. Pt in no acute respiratory distress at this time.

## 2018-12-20 NOTE — ED Notes (Signed)
ED TO INPATIENT HANDOFF REPORT  ED Nurse Name and Phone #:  [phone]  S Name/Age/Gender Harry Carroll 58 y.o. male Room/Bed: 035C/035C  Code Status   Code Status: Prior  Home/SNF/Other Home Patient oriented to: self, place, time and situation Is this baseline? Yes   Triage Complete: Triage complete  Chief Complaint Per doc COVID+,SOB  Triage Note Pt reports he was COVID + 13 days ago, c.o fever, cough, diarrhea which has all improved but his SOB has gotten worse. His PCP recommended he come here for blood work and chest xray. Pt 88% on room air, 2L [place] applied. Pt in no acute respiratory distress at this time.   Allergies Allergies  Allergen Reactions  . Ciprofloxacin Shortness Of Breath, Nausea And Vomiting, Nausea Only, Rash and Other (See Comments)    Marked mucous production  . Dilaudid [Hydromorphone Hcl] Shortness Of Breath, Nausea Only and Other (See Comments)    Patient developed rash, excessive mucous production and chest pain - tolerates morphine  . Hydrocodone Itching    Level of Care/Admitting Diagnosis ED Disposition    ED Disposition Condition [facility] Area: [place] [100101]  Level of Care: Telemetry [5]  Covid Evaluation: Confirmed COVID Positive  Diagnosis: Pneumonia due to COVID-19 virus [[id number]]  Admitting Physician: Rise Patience [phone]  Attending Physician: Rise Patience [phone]  Estimated length of stay: past midnight tomorrow  Certification:: I certify this patient will need inpatient services for at least 2 midnights  PT Class (Do Not Modify): Inpatient [101]  PT Acc Code (Do Not Modify): Private [1]       B Medical/Surgery History Past Medical History:  Diagnosis Date  . Arthritis   . Bipolar 1 disorder (HCC)    Dr Clovis Pu , pt denies  . Carotid stenosis   . Carpal tunnel syndrome    saw Dr Kristie Cowman , pt unaware  . Cervical spinal stenosis   . Colon polyps    adenomatous  .  Complication of anesthesia    trouble placing catheter with last surgeery, had to get urologist  . Diverticulitis   . Diverticulosis   . Heart murmur    pt denies  . History of hiatal hernia    resolved  . Hyperlipemia   . Insomnia    related to pain  . Normal cardiac stress test    CP 01-2009---Nl ECHO and stress test neg  . Numbness of fingers of both hands    when sleeps, right hand worse than left  . PVC (premature ventricular contraction)    history of PVC noted while in [employer]  . Restless leg   . Sleep apnea    does not use cpap due to does not like   Past Surgical History:  Procedure Laterality Date  . COLONOSCOPY WITH PROPOFOL N/A 04/02/2014   Procedure: COLONOSCOPY WITH PROPOFOL;  Surgeon: Inda Castle, MD;  [facility]: [facility];  Service: Endoscopy;  Laterality: N/A;  . CYSTOSCOPY WITH URETHRAL DILATATION  12/20/2011   Procedure: CYSTOSCOPY WITH URETHRAL DILATATION;  Surgeon: Earnstine Regal, MD;  [facility];  Service: General;;  with foley insertion  . CYSTOSCOPY WITH URETHRAL DILATATION  12/20/2011   Procedure: CYSTOSCOPY WITH URETHRAL DILATATION;  Surgeon: Franchot Gallo, MD;  [facility];  Service: Urology;;  . Consuela Mimes WITH URETHRAL DILATATION N/A 12/15/2017   Procedure: CYSTOSCOPY WITH URETHRAL DILATATION;  Surgeon: Irine Seal, MD;  [facility];  Service: Urology;  Laterality: N/A;  . HOT HEMOSTASIS N/A 04/02/2014   Procedure: HOT HEMOSTASIS (ARGON PLASMA COAGULATION/BICAP);  Surgeon: Inda Castle, MD;  Location: [facility];  Service: Endoscopy;  Laterality: N/A;  . maxillofacial surgery    . PARTIAL COLECTOMY  12/20/2011   Procedure: PARTIAL COLECTOMY;  Surgeon: Earnstine Regal, MD;  [facility];  Service: General;  Laterality: N/A;  Sigmoid colectomy  . right knee arthroscopy     2007 x2  . right shoulder surgery     X2  . SHOULDER SURGERY     LEFT  . TOTAL KNEE ARTHROPLASTY Left 12/15/2017   Procedure: LEFT TOTAL KNEE ARTHROPLASTY;   Surgeon: Sydnee Cabal, MD;  [facility];  Service: Orthopedics;  Laterality: Left;  Adductor Block  . TOTAL KNEE ARTHROPLASTY Right 05/18/2018   Procedure: TOTAL KNEE ARTHROPLASTY;  Surgeon: Sydnee Cabal, MD;  [facility];  Service: Orthopedics;  Laterality: Right;  with block     A IV Location/Drains/Wounds Patient Lines/Drains/Airways Status   Active Line/Drains/Airways    Name:   Placement date:   Placement time:   Site:   Days:   Peripheral IV 12/20/18 Left Antecubital   12/20/18    2116    Antecubital   less than 1   Incision (Closed) 05/18/18 Knee   05/18/18    1245     216          Intake/Output Last 24 hours No intake or output data in the 24 hours ending 12/20/18 2349  Labs/Imaging Results for orders placed or performed during the hospital encounter of 12/20/18 (from the past 48 hour(s))  Basic metabolic panel     Status: Abnormal   Collection Time: 12/20/18  9:22 PM  Result Value Ref Range   Sodium 133 (L) 135 - 145 mmol/L   Potassium 3.8 3.5 - 5.1 mmol/L   Chloride 98 98 - 111 mmol/L   CO2 22 22 - 32 mmol/L   Glucose, Bld 93 70 - 99 mg/dL   BUN 17 6 - 20 mg/dL   Creatinine, Ser 1.02 0.61 - 1.24 mg/dL   Calcium 9.1 8.9 - 10.3 mg/dL   GFR calc non Af Amer >60 >60 mL/min   GFR calc Af Amer >60 >60 mL/min   Anion gap 13 5 - 15    Comment: Performed at [facility], [place] [address] [place], [place] [address]  CBC with Differential     Status: Abnormal   Collection Time: 12/20/18  9:22 PM  Result Value Ref Range   WBC 8.0 4.0 - 10.5 K/uL   RBC 4.86 4.22 - 5.81 MIL/uL   Hemoglobin 15.2 13.0 - 17.0 g/dL   HCT 43.9 39.0 - 52.0 %   MCV 90.3 80.0 - 100.0 fL   MCH 31.3 26.0 - 34.0 pg   MCHC 34.6 30.0 - 36.0 g/dL   RDW 13.1 11.5 - 15.5 %   Platelets 233 150 - 400 K/uL   nRBC 0.0 0.0 - 0.2 %   Neutrophils Relative % 87 %   Neutro Abs 7.0 1.7 - 7.7 K/uL   Lymphocytes Relative 7 %   Lymphs Abs 0.6 (L) 0.7 - 4.0 K/uL   Monocytes Relative 4 %    Monocytes Absolute 0.3 0.1 - 1.0 K/uL   Eosinophils Relative 1 %   Eosinophils Absolute 0.1 0.0 - 0.5 K/uL   Basophils Relative 1 %   Basophils Absolute 0.1 0.0 - 0.1 K/uL   nRBC 0 0 /100  WBC   Abs Immature Granulocytes 0.00 0.00 - 0.07 K/uL    Comment: Performed at [facility], [place] [address] [place], [place] [address]  Lactic acid, plasma     Status: None   Collection Time: 12/20/18  9:22 PM  Result Value Ref Range   Lactic Acid, Venous 1.6 0.5 - 1.9 mmol/L    Comment: Performed at [facility] [address], [place], [place] [address]   Dg Chest Portable 1 View  Result Date: 12/20/2018 CLINICAL DATA:  58 year old who tested positive for COVID-19 approximately 2 weeks ago, presenting with fever, cough and diarrhea which have shown some improvement, though shortness of breath has worsened. Hypoxemia. EXAM: PORTABLE CHEST 1 VIEW COMPARISON:  04/17/2017 and earlier. FINDINGS: Cardiac silhouette normal in size for AP portable technique. Patchy ground-glass airspace opacities in both lungs, LEFT greater than RIGHT. Pulmonary vascularity normal. No visible pleural effusions. IMPRESSION: Bilateral pneumonia, LEFT greater than RIGHT, likely viral. Electronically Signed   By: Evangeline Dakin M.D.   On: 12/20/2018 21:26    Pending Labs Unresulted Labs (From admission, onward)   None      Vitals/Pain Today's Vitals   12/20/18 2300 12/20/18 2315 12/20/18 2330 12/20/18 2345  BP: (!) 145/79 140/79 (!) 125/98 (!) 148/90  Pulse: 85 86 88 96  Resp: 10 (!) 21 15 17   Temp:      SpO2: 92% 93% 93% 92%  PainSc:        Isolation Precautions No active isolations  Medications Medications  cefTRIAXone (ROCEPHIN) 1 g in sodium chloride 0.9 % 100 mL IVPB (1 g Intravenous New Bag/Given 12/20/18 2325)  azithromycin (ZITHROMAX) 500 mg in sodium chloride 0.9 % 250 mL IVPB (has no administration in time range)  albuterol (VENTOLIN HFA) 108 (90 Base) MCG/ACT inhaler 2 puff (2 puffs  Inhalation Given 12/20/18 2324)  acetaminophen (TYLENOL) tablet 650 mg (650 mg Oral Given 12/20/18 2324)    Mobility walks Low fall risk   Focused Assessments Pulmonary Assessment Handoff:  Lung sounds: Bilateral Breath Sounds: Diminished O2 Device: Nasal Cannula O2 Flow Rate (L/min): 2 L/min      R Recommendations: See Admitting Provider Note  Report given to:   Additional Notes:

## 2018-12-20 NOTE — ED Provider Notes (Signed)
[facility] EMERGENCY DEPARTMENT Provider Note   CSN: [id number] Arrival date & time: 12/20/18  2013    History   Chief Complaint Chief Complaint  Patient presents with  . Shortness of Breath  . COVID +    HPI Harry Carroll is a 58 y.o. male with history of hyperlipidemia, diverticulitis who presents with worsening shortness of breath over the past 2 weeks.  Patient was diagnosed with COVID-19 9 days ago.  Patient reports he initially had cough, diarrhea, fever, all of which have resolved except for low-grade fever.  Patient reports worsening shortness of breath on exertion.  He was sent by his doctor for further work-up.  He denies any significant chest pain, abdominal pain, nausea, vomiting.      Shortness of Breath Associated symptoms: fever   Associated symptoms: no abdominal pain, no chest pain, no headaches, no rash, no sore throat and no vomiting     Past Medical History:  Diagnosis Date  . Arthritis   . Bipolar 1 disorder (HCC)    Dr Clovis Pu , pt denies  . Carotid stenosis   . Carpal tunnel syndrome    saw Dr Kristie Cowman , pt unaware  . Cervical spinal stenosis   . Colon polyps    adenomatous  . Complication of anesthesia    trouble placing catheter with last surgeery, had to get urologist  . Diverticulitis   . Diverticulosis   . Heart murmur    pt denies  . History of hiatal hernia    resolved  . Hyperlipemia   . Insomnia    related to pain  . Normal cardiac stress test    CP 01-2009---Nl ECHO and stress test neg  . Numbness of fingers of both hands    when sleeps, right hand worse than left  . PVC (premature ventricular contraction)    history of PVC noted while in [employer]  . Restless leg   . Sleep apnea    does not use cpap due to does not like    Patient Active Problem List   Diagnosis Date Noted  . Pneumonia due to COVID-19 virus 12/20/2018  . De Quervain's tenosynovitis, right 07/31/2018  . S/P knee replacement 05/18/2018  .  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 Hyperglycemia 06/22/2017  . Numbness and tingling in right hand 01/04/2017  . Low testosterone 10/03/2016  . Obesity 03/28/2016  . Bilateral wrist pain 12/25/2015  . Acute diverticulitis 10/21/2015  . Numbness and tingling of both upper extremities while sleeping 08/27/2015  . Routine general medical examination at a health care facility 03/24/2015  . Bipolar I disorder, most recent episode (or current) unspecified 02/26/2015  . Rib contusion 11/13/2014  . Ganglion cyst of wrist 06/26/2014  . History of colonic polyps 04/02/2014  . Diverticulitis of colon (without mention of hemorrhage)(562.11) 01/17/2014  . Chest pain 09/04/2013  . Abdominal pain, left lower quadrant 02/18/2013  . Dizziness 03/29/2012  . Diverticulitis-post resection July 2013 11/27/2011  . OSA (obstructive sleep apnea) 11/17/2011  . Fatigue 10/11/2011  . Personal history of colonic polyps 02/18/2011  . HYPERTRIGLYCERIDEMIA 02/20/2009  . BIPOLAR DISORDER UNSPECIFIED 11/29/2007  . INSOMNIA 11/29/2007  . LIVER FUNCTION TESTS, ABNORMAL, HX OF 11/29/2007  . DEPRESSION 07/07/2007  . Hyperlipidemia 04/16/2007    Past Surgical History:  Procedure Laterality Date  . COLONOSCOPY WITH PROPOFOL N/A 04/02/2014   Procedure: COLONOSCOPY WITH PROPOFOL;  Surgeon: Inda Castle, MD;  [facility];  Service: Endoscopy;  Laterality: N/A;  . CYSTOSCOPY WITH URETHRAL DILATATION  12/20/2011   Procedure: CYSTOSCOPY WITH URETHRAL DILATATION;  Surgeon: Earnstine Regal, MD;  [facility];  Service: General;;  with foley insertion  . CYSTOSCOPY WITH URETHRAL DILATATION  12/20/2011   Procedure: CYSTOSCOPY WITH URETHRAL DILATATION;  Surgeon: Franchot Gallo, MD;  [facility];  Service: Urology;;  . Consuela Mimes WITH URETHRAL DILATATION N/A 12/15/2017   Procedure: CYSTOSCOPY WITH URETHRAL DILATATION;  Surgeon: Irine Seal, MD;  [facility];  Service:  Urology;  Laterality: N/A;  . HOT HEMOSTASIS N/A 04/02/2014   Procedure: HOT HEMOSTASIS (ARGON PLASMA COAGULATION/BICAP);  Surgeon: Inda Castle, MD;  Location[facility];  Service: Endoscopy;  Laterality: N/A;  . maxillofacial surgery    . PARTIAL COLECTOMY  12/20/2011   Procedure: PARTIAL COLECTOMY;  Surgeon: Earnstine Regal, MD;  [facility];  Service: General;  Laterality: N/A;  Sigmoid colectomy  . right knee arthroscopy     2007 x2  . right shoulder surgery     X2  . SHOULDER SURGERY     LEFT  . TOTAL KNEE ARTHROPLASTY Left 12/15/2017   Procedure: LEFT TOTAL KNEE ARTHROPLASTY;  Surgeon: Sydnee Cabal, MD;  [facility];  Service: Orthopedics;  Laterality: Left;  Adductor Block  . TOTAL KNEE ARTHROPLASTY Right 05/18/2018   Procedure: TOTAL KNEE ARTHROPLASTY;  Surgeon: Sydnee Cabal, MD;  [facility];  Service: Orthopedics;  Laterality: Right;  with block        Home Medications    Prior to Admission medications   Medication Sig Start Date End Date Taking? Authorizing Provider  acetaminophen (TYLENOL) 500 MG tablet Take 1,000 mg by mouth every 6 (six) hours as needed for mild pain or fever.    Yes [provider]  rosuvastatin (CRESTOR) 20 MG tablet TAKE 1 TABLET BY MOUTH EVERY DAY Patient taking differently: Take 20 mg by mouth at bedtime.  08/27/18  Yes Biagio Borg, MD  Diclofenac Sodium (PENNSAID) 2 % SOLN Place 1 application onto the skin 2 (two) times daily. Patient not taking: Reported on 12/20/2018 08/03/18   Rosemarie Ax, MD    Family History Family History  Problem Relation Age of Onset  . Prostate cancer Paternal Grandfather   . Cancer Paternal Aunt        lymph cancer  . Alcohol abuse Father   . Alcohol abuse Sister   . Colon cancer Neg Hx   . CAD Neg Hx   . Pancreatic cancer Neg Hx   . Rectal cancer Neg Hx   . Stomach cancer Neg Hx   . Drug abuse Neg Hx   . Anxiety disorder Neg Hx   . Bipolar disorder Neg Hx   . Depression  Neg Hx     Social History Social History   Tobacco Use  . Smoking status: Current Some Day Smoker    Types: Cigars  . Smokeless tobacco: Current User    Types: Snuff  . Tobacco comment: 1 per week  Substance Use Topics  . Alcohol use: Yes    Comment:  a couple beers a few times a week   . Drug use: No    Comment: denies THC, cocainse, stimulants, pain meds, benzos, synthetic drugs     Allergies   Ciprofloxacin, Dilaudid [hydromorphone hcl], and Hydrocodone   Review of Systems Review of Systems  Constitutional: Positive for fever. Negative for chills.  HENT: Negative for facial swelling and sore throat.  Respiratory: Positive for shortness of breath.   Cardiovascular: Negative for chest pain.  Gastrointestinal: Negative for abdominal pain, nausea and vomiting.  Genitourinary: Negative for dysuria.  Musculoskeletal: Negative for back pain.  Skin: Negative for rash and wound.  Neurological: Negative for headaches.  Psychiatric/Behavioral: The patient is not nervous/anxious.      Physical Exam Updated Vital Signs BP (!) 148/90   Pulse 96   Temp (!) 100.4 F (38 C)   Resp 17   SpO2 92%   Physical Exam Vitals signs and nursing note reviewed.  Constitutional:      General: He is not in acute distress.    Appearance: He is well-developed. He is not diaphoretic.  HENT:     Head: Normocephalic and atraumatic.     Mouth/Throat:     Pharynx: No oropharyngeal exudate.  Eyes:     General: No scleral icterus.       Right eye: No discharge.        Left eye: No discharge.     Conjunctiva/sclera: Conjunctivae normal.     Pupils: Pupils are equal, round, and reactive to light.  Neck:     Musculoskeletal: Normal range of motion and neck supple.     Thyroid: No thyromegaly.  Cardiovascular:     Rate and Rhythm: Normal rate and regular rhythm.     Heart sounds: Normal heart sounds. No murmur. No friction rub. No gallop.   Pulmonary:     Effort: Pulmonary effort is  normal. No respiratory distress.     Breath sounds: Normal breath sounds. No stridor. No wheezing or rales.     Comments: Hypoxic to 88% on room air Abdominal:     General: Bowel sounds are normal. There is no distension.     Palpations: Abdomen is soft.     Tenderness: There is no abdominal tenderness. There is no guarding or rebound.  Lymphadenopathy:     Cervical: No cervical adenopathy.  Skin:    General: Skin is warm and dry.     Coloration: Skin is not pale.     Findings: No rash.  Neurological:     Mental Status: He is alert.     Coordination: Coordination normal.      ED Treatments / Results  Labs (all labs ordered are listed, but only abnormal results are displayed) Labs Reviewed  BASIC METABOLIC PANEL - Abnormal; Notable for the following components:      Result Value   Sodium 133 (*)    All other components within normal limits  CBC WITH DIFFERENTIAL/PLATELET - Abnormal; Notable for the following components:   Lymphs Abs 0.6 (*)    All other components within normal limits  LACTIC ACID, PLASMA    EKG None  Radiology Dg Chest Portable 1 View  Result Date: 12/20/2018 CLINICAL DATA:  58 year old who tested positive for COVID-19 approximately 2 weeks ago, presenting with fever, cough and diarrhea which have shown some improvement, though shortness of breath has worsened. Hypoxemia. EXAM: PORTABLE CHEST 1 VIEW COMPARISON:  04/17/2017 and earlier. FINDINGS: Cardiac silhouette normal in size for AP portable technique. Patchy ground-glass airspace opacities in both lungs, LEFT greater than RIGHT. Pulmonary vascularity normal. No visible pleural effusions. IMPRESSION: Bilateral pneumonia, LEFT greater than RIGHT, likely viral. Electronically Signed   By: Evangeline Dakin M.D.   On: 12/20/2018 21:26    Procedures Procedures (including critical care time)  Medications Ordered in ED Medications  azithromycin (ZITHROMAX) 500 mg in sodium chloride 0.9 % 250  mL IVPB (500 mg  Intravenous New Bag/Given 12/21/18 0023)  albuterol (VENTOLIN HFA) 108 (90 Base) MCG/ACT inhaler 2 puff (2 puffs Inhalation Given 12/20/18 2324)  cefTRIAXone (ROCEPHIN) 1 g in sodium chloride 0.9 % 100 mL IVPB (1 g Intravenous New Bag/Given 12/20/18 2325)  acetaminophen (TYLENOL) tablet 650 mg (650 mg Oral Given 12/20/18 2324)     Initial Impression / Assessment and Plan / ED Course  I have reviewed the triage vital signs and the nursing notes.  Pertinent labs & imaging results that were available during my care of the patient were reviewed by me and considered in my medical decision making (see chart for details).  Clinical Course as of Dec 21 23  Thu Dec 20, 5510  4990 58 year old male with recent Covid admission at [facility] here with increased shortness of breath.  Chest x-ray now showing bilateral infiltrates.  Will need admission back to [facility] for further management of Covid.   [MB]    Clinical Course User Index [MB] Hayden Rasmussen, MD       Patient presenting with COVID-19 infection with bilateral lower pneumonia, likely viral.  However, considering other symptoms of COVID have resolved, will cover with Rocephin and azithromycin for possible secondary bacterial infection.  Labs are unremarkable including lactic acid.  Patient found with temperature of 100.4.  Tylenol given.  Patient is hypoxic at rest and with ambulation into the high 80s.  Do not feel outpatient treatment is indicated at this time considering patient's new oxygen requirement.  He is saturating in the low to mid 90s at 2 L.  He is in no acute distress.  I discussed patient case with Dr. Hal Hope with [facility] who accepts patient for admission.  I appreciate his assistance with the patient. I discussed patient case with my attending, Dr. Melina Copa, who guided the patient's management and agrees with plan.  ROEY COOPMAN was evaluated in Emergency Department on 12/21/2018 for the symptoms described in the history of  present illness. He was evaluated in the context of the global COVID-19 pandemic, which necessitated consideration that the patient might be at risk for infection with the SARS-CoV-2 virus that causes COVID-19. Institutional protocols and algorithms that pertain to the evaluation of patients at risk for COVID-19 are in a state of rapid change based on information released by regulatory bodies including the CDC and federal and state organizations. These policies and algorithms were followed during the patient's care in the ED.    Final Clinical Impressions(s) / ED Diagnoses   Final diagnoses:  COVID-19 virus infection  Shortness of breath  Community acquired pneumonia, unspecified laterality    ED Discharge Orders    None       Frederica Kuster, PA-C 12/21/18 0025    Hayden Rasmussen, MD 12/21/18 1010

## 2018-12-21 ENCOUNTER — Encounter (HOSPITAL_COMMUNITY): Payer: Self-pay | Admitting: Internal Medicine

## 2018-12-21 ENCOUNTER — Other Ambulatory Visit: Payer: Self-pay

## 2018-12-21 DIAGNOSIS — J9601 Acute respiratory failure with hypoxia: Secondary | ICD-10-CM | POA: Diagnosis present

## 2018-12-21 DIAGNOSIS — G4733 Obstructive sleep apnea (adult) (pediatric): Secondary | ICD-10-CM

## 2018-12-21 DIAGNOSIS — F320 Major depressive disorder, single episode, mild: Secondary | ICD-10-CM

## 2018-12-21 DIAGNOSIS — F32A Depression, unspecified: Secondary | ICD-10-CM | POA: Diagnosis present

## 2018-12-21 DIAGNOSIS — F319 Bipolar disorder, unspecified: Secondary | ICD-10-CM

## 2018-12-21 DIAGNOSIS — J1289 Other viral pneumonia: Secondary | ICD-10-CM

## 2018-12-21 DIAGNOSIS — J189 Pneumonia, unspecified organism: Secondary | ICD-10-CM

## 2018-12-21 DIAGNOSIS — U071 COVID-19: Principal | ICD-10-CM

## 2018-12-21 DIAGNOSIS — F329 Major depressive disorder, single episode, unspecified: Secondary | ICD-10-CM | POA: Diagnosis present

## 2018-12-21 LAB — CBC WITH DIFFERENTIAL/PLATELET
Abs Immature Granulocytes: 0.05 10*3/uL (ref 0.00–0.07)
Basophils Absolute: 0 10*3/uL (ref 0.0–0.1)
Basophils Relative: 0 %
Eosinophils Absolute: 0 10*3/uL (ref 0.0–0.5)
Eosinophils Relative: 0 %
HCT: 43.4 % (ref 39.0–52.0)
Hemoglobin: 14.9 g/dL (ref 13.0–17.0)
Immature Granulocytes: 1 %
Lymphocytes Relative: 9 %
Lymphs Abs: 0.5 10*3/uL — ABNORMAL LOW (ref 0.7–4.0)
MCH: 31 pg (ref 26.0–34.0)
MCHC: 34.3 g/dL (ref 30.0–36.0)
MCV: 90.4 fL (ref 80.0–100.0)
Monocytes Absolute: 0.1 10*3/uL (ref 0.1–1.0)
Monocytes Relative: 2 %
Neutro Abs: 4.8 10*3/uL (ref 1.7–7.7)
Neutrophils Relative %: 88 %
Platelets: 267 10*3/uL (ref 150–400)
RBC: 4.8 MIL/uL (ref 4.22–5.81)
RDW: 13.3 % (ref 11.5–15.5)
WBC: 5.4 10*3/uL (ref 4.0–10.5)
nRBC: 0 % (ref 0.0–0.2)

## 2018-12-21 LAB — COMPREHENSIVE METABOLIC PANEL
ALT: 63 U/L — ABNORMAL HIGH (ref 0–44)
AST: 67 U/L — ABNORMAL HIGH (ref 15–41)
Albumin: 3.4 g/dL — ABNORMAL LOW (ref 3.5–5.0)
Alkaline Phosphatase: 55 U/L (ref 38–126)
Anion gap: 16 — ABNORMAL HIGH (ref 5–15)
BUN: 20 mg/dL (ref 6–20)
CO2: 20 mmol/L — ABNORMAL LOW (ref 22–32)
Calcium: 9 mg/dL (ref 8.9–10.3)
Chloride: 97 mmol/L — ABNORMAL LOW (ref 98–111)
Creatinine, Ser: 0.84 mg/dL (ref 0.61–1.24)
GFR calc Af Amer: >60 mL/min (ref >60)
GFR calc non Af Amer: >60 mL/min (ref >60)
Glucose, Bld: 251 mg/dL — ABNORMAL HIGH (ref 70–99)
Potassium: 4.2 mmol/L (ref 3.5–5.1)
Sodium: 133 mmol/L — ABNORMAL LOW (ref 135–145)
Total Bilirubin: 0.5 mg/dL (ref 0.3–1.2)
Total Protein: 7.4 g/dL (ref 6.5–8.1)

## 2018-12-21 LAB — TYPE AND SCREEN
ABO/RH(D): A POS
Antibody Screen: NEGATIVE

## 2018-12-21 LAB — BRAIN NATRIURETIC PEPTIDE: B Natriuretic Peptide: 32.3 pg/mL (ref 0.0–100.0)

## 2018-12-21 LAB — FERRITIN: Ferritin: 1423 ng/mL — ABNORMAL HIGH (ref 24–336)

## 2018-12-21 LAB — C-REACTIVE PROTEIN: CRP: 22.8 mg/dL — ABNORMAL HIGH (ref <1.0)

## 2018-12-21 LAB — FIBRINOGEN: Fibrinogen: >800 mg/dL — ABNORMAL HIGH (ref 210–475)

## 2018-12-21 LAB — TROPONIN I (HIGH SENSITIVITY)
Troponin I (High Sensitivity): 4 ng/L (ref <18)
Troponin I (High Sensitivity): 3 ng/L (ref <18)

## 2018-12-21 LAB — MAGNESIUM: Magnesium: 2.2 mg/dL (ref 1.7–2.4)

## 2018-12-21 LAB — LACTATE DEHYDROGENASE: LDH: 479 U/L — ABNORMAL HIGH (ref 98–192)

## 2018-12-21 LAB — ABO/RH: ABO/RH(D): A POS

## 2018-12-21 LAB — PROCALCITONIN: Procalcitonin: 0.28 ng/mL

## 2018-12-21 LAB — D-DIMER, QUANTITATIVE: D-Dimer, Quant: 2.74 ug/mL-FEU — ABNORMAL HIGH (ref 0.00–0.50)

## 2018-12-21 LAB — SEDIMENTATION RATE: Sed Rate: 84 mm/hr — ABNORMAL HIGH (ref 0–16)

## 2018-12-21 MED ORDER — VITAMIN C 500 MG PO TABS
500.0000 mg | ORAL_TABLET | Freq: Every day | ORAL | Status: DC
  Administered 2018-12-21 – 2018-12-26 (×6): 500 mg via ORAL
  Filled 2018-12-21 (×6): qty 1

## 2018-12-21 MED ORDER — ENOXAPARIN SODIUM 40 MG/0.4ML ~~LOC~~ SOLN
40.0000 mg | SUBCUTANEOUS | Status: DC
  Administered 2018-12-21 – 2018-12-25 (×5): 40 mg via SUBCUTANEOUS
  Filled 2018-12-21 (×5): qty 0.4

## 2018-12-21 MED ORDER — ACETAMINOPHEN 325 MG PO TABS: 650.0000 mg | ORAL_TABLET | Freq: Four times a day (QID) | ORAL | Status: DC | PRN

## 2018-12-21 MED ORDER — ONDANSETRON HCL 4 MG/2ML IJ SOLN: 4.0000 mg | Freq: Four times a day (QID) | INTRAMUSCULAR | Status: DC | PRN

## 2018-12-21 MED ORDER — SODIUM CHLORIDE 0.9 % IV SOLN
200.0000 mg | Freq: Once | INTRAVENOUS | Status: AC
  Administered 2018-12-21: 200 mg via INTRAVENOUS
  Filled 2018-12-21: qty 40

## 2018-12-21 MED ORDER — SODIUM CHLORIDE 0.9 % IV SOLN
100.0000 mg | INTRAVENOUS | Status: AC
  Administered 2018-12-22 – 2018-12-25 (×4): 100 mg via INTRAVENOUS
  Filled 2018-12-21 (×4): qty 20

## 2018-12-21 MED ORDER — IPRATROPIUM-ALBUTEROL 20-100 MCG/ACT IN AERS
1.0000 | INHALATION_SPRAY | Freq: Four times a day (QID) | RESPIRATORY_TRACT | Status: DC
  Administered 2018-12-21 – 2018-12-26 (×20): 1 via RESPIRATORY_TRACT
  Filled 2018-12-21: qty 4

## 2018-12-21 MED ORDER — ORAL CARE MOUTH RINSE
15.0000 mL | Freq: Two times a day (BID) | OROMUCOSAL | Status: DC
  Administered 2018-12-21 – 2018-12-26 (×9): 15 mL via OROMUCOSAL

## 2018-12-21 MED ORDER — ONDANSETRON HCL 4 MG PO TABS: 4.0000 mg | ORAL_TABLET | Freq: Four times a day (QID) | ORAL | Status: DC | PRN

## 2018-12-21 MED ORDER — FOLIC ACID 1 MG PO TABS
1.0000 mg | ORAL_TABLET | Freq: Every day | ORAL | Status: DC
  Administered 2018-12-21 – 2018-12-26 (×6): 1 mg via ORAL
  Filled 2018-12-21 (×6): qty 1

## 2018-12-21 MED ORDER — METHYLPREDNISOLONE SODIUM SUCC 125 MG IJ SOLR
60.0000 mg | Freq: Two times a day (BID) | INTRAMUSCULAR | Status: DC
  Administered 2018-12-21: 02:00:00 60 mg via INTRAVENOUS
  Filled 2018-12-21: qty 2

## 2018-12-21 MED ORDER — ACETAMINOPHEN 650 MG RE SUPP: 650.0000 mg | Freq: Four times a day (QID) | RECTAL | Status: DC | PRN

## 2018-12-21 MED ORDER — VITAMIN B-1 100 MG PO TABS
100.0000 mg | ORAL_TABLET | Freq: Every day | ORAL | Status: DC
  Administered 2018-12-21 – 2018-12-26 (×6): 100 mg via ORAL
  Filled 2018-12-21 (×6): qty 1

## 2018-12-21 MED ORDER — ZINC SULFATE 220 (50 ZN) MG PO CAPS
220.0000 mg | ORAL_CAPSULE | Freq: Every day | ORAL | Status: DC
  Administered 2018-12-21 – 2018-12-26 (×6): 220 mg via ORAL
  Filled 2018-12-21 (×5): qty 1

## 2018-12-21 MED ORDER — METHYLPREDNISOLONE SODIUM SUCC 40 MG IJ SOLR
40.0000 mg | Freq: Three times a day (TID) | INTRAMUSCULAR | Status: DC
  Administered 2018-12-21 – 2018-12-25 (×13): 40 mg via INTRAVENOUS
  Filled 2018-12-21 (×13): qty 1

## 2018-12-21 MED ORDER — ROSUVASTATIN CALCIUM 20 MG PO TABS
20.0000 mg | ORAL_TABLET | Freq: Every day | ORAL | Status: DC
  Administered 2018-12-21 – 2018-12-25 (×5): 20 mg via ORAL
  Filled 2018-12-21 (×5): qty 1

## 2018-12-21 NOTE — Progress Notes (Signed)
Pharmacy Brief Note  O:  ALT: 63 CXR: consistent with B/L PNA SpO2: 92 % on 2 L [place]  A/P:  Patient meets criteria for remdesevir therapy. Will initiate remdesivir 200 mg iv once followed by 100 mg iv daily x 4 days. Will f/u ALT.   Napoleon Form, [facility] 12/21/18 2:17 PM

## 2018-12-21 NOTE — ED Notes (Signed)
Called [facility] for transport to [facility] 

## 2018-12-21 NOTE — Progress Notes (Signed)
Inpatient Diabetes Program Recommendations  AACE/ADA: New Consensus Statement on Inpatient Glycemic Control (2015)  Target Ranges:  Prepandial:   less than 140 mg/dL      Peak postprandial:   less than 180 mg/dL (1-2 hours)      Critically ill patients:  140 - 180 mg/dL   Lab Results  Component Value Date   GLUCAP 95 10/23/2015   HGBA1C 5.8 06/22/2017   Results for PARIS, CHIRIBOGA (MRN [id number]) as of 12/21/2018 14:39  Ref. Range 12/20/2018 21:22 12/21/2018 10:40  Glucose Latest Ref Range: 70 - 99 mg/dL 93 251 (H)   Review of Glycemic Control  Diabetes history: None  Current orders for Inpatient glycemic control: None  Inpatient Diabetes Program Recommendations:    Lab glucose elevated in 200's. IV Solumedrol 40 mg tid. Please consider CBGs and if elevated, Novolog correction scale.  Thanks,  Tama Headings RN, MSN, BC-ADM Inpatient Diabetes Coordinator Team Pager [phone] (8a-5p)

## 2018-12-21 NOTE — ED Notes (Signed)
Breakfast tray ordered 

## 2018-12-21 NOTE — Progress Notes (Signed)
PROGRESS NOTE    Harry Carroll  IWP:[id number] DOB: Nov 01, 1960 DOA: 12/20/2018 PCP: Biagio Borg, MD   Brief Narrative:  58 y.WM  PMHx depression, bipolar disorder unspecified , H LD, OSA, diverticulitis, Hx colonic polyps, insomnia, obesity  Presents to the ER because of shortness of breath.  Patient states he was diagnosed with COVID-19 about a week ago.  Patient symptoms started about 2 weeks ago with fever chills productive cough diarrhea and subsequently since symptoms persisted patient had COVID-19 test done which came positive.  Following which patient symptoms have greatly resolved but over the last 24 to 48 hours patient started becoming short of breath on minimal exertion.  Denies any swelling of the extremities chest pain.  ED Course: In the ER chest x-ray shows bilateral infiltrates concerning for pneumonia.  Patient was febrile with temperature around 100.4 F.  Labs show COVID-19 test was positive again.  WBC is 8 hemoglobin 15 creatinine 1.  Patient admitted for acute respiratory failure secondary to COVID pneumonia.    Subjective: A/O x4, positive S OB, negative CP, negative abdominal pain.  States took a trip down to [place] with wife and daughter, now daughter and wife both COVID positive.   Assessment & Plan:   Principal Problem:   Acute respiratory failure with hypoxia (HCC) Active Problems:   BIPOLAR DISORDER UNSPECIFIED   INSOMNIA   OSA (obstructive sleep apnea)   History of colonic polyps   Obesity   COVID-19 virus infection   Depression  Acute respiratory failure with hypoxia/COVID 19 pneumonia - Solu-Medrol 40 mg 3 times daily - Patient meets criteria for Remdesivir requiring 2 L O2 via [place] SPO2 89%, - Remdesivir per pharmacy protocol -Patient counseled that if his respiratory status deteriorates or certain inflammatory markers are elevated will start Actemra, patient in agreement with treatment plan. - COVID 19 inflammatory markers pending -  Titrate O2 to maintain SPO2> 93% - Currently patient not requiring pronating however if patient's respiratory status begins to decline prone patient aggressively 2 to 3 hours every 12 hours - Flutter valve  OSA -Required at night use high flow nasal cannula to maintain SPO2> 93%  Depression/bipolar unspecified -Patient not on medication for depression or bipolar disease.  Stable  HLD - Crestor 20 mg daily - Lipid panel pending  Obesity     DVT prophylaxis: Lovenox Code Status: Full Family Communication: None Disposition Plan: TBD   Consultants:    Procedures/Significant Events:  7/9 PCXR bilateral patchy opacification LEFT>> RIGHT.  Consistent with pneumonia most likely viral   I have personally reviewed and interpreted all radiology studies and my findings are as above.  VENTILATOR SETTINGS:    Cultures 6/30 Novel coronavirus positive    Antimicrobials: Anti-infectives (From admission, onward)   Start     Stop   12/22/18 1530  remdesivir 100 mg in sodium chloride 0.9 % 250 mL IVPB     12/26/18 1529   12/21/18 1530  remdesivir 200 mg in sodium chloride 0.9 % 250 mL IVPB         12/20/18 2230  cefTRIAXone (ROCEPHIN) 1 g in sodium chloride 0.9 % 100 mL IVPB     12/20/18 2325   12/20/18 2230  azithromycin (ZITHROMAX) 500 mg in sodium chloride 0.9 % 250 mL IVPB     12/21/18 0146       Devices    LINES / TUBES:      Continuous Infusions:   Objective: Vitals:   12/21/18 0400 12/21/18  0500 12/21/18 0600 12/21/18 0700  BP: 126/82 139/89 135/83 (!) 142/87  Pulse: 74 74 66 73  Resp: 13 (!) 21 20 (!) 21  Temp:      TempSrc:      SpO2: 91% 94% 90% 92%   No intake or output data in the 24 hours ending 12/21/18 0931 There were no vitals filed for this visit.  Examination:  General: A/O x4, positive acute respiratory distress Eyes: negative scleral hemorrhage, negative anisocoria, negative icterus ENT: Negative Runny nose, negative gingival  bleeding, Neck:  Negative scars, masses, torticollis, lymphadenopathy, JVD Lungs: Clear to auscultation bilaterally without wheezes or crackles Cardiovascular: Regular rate and rhythm without murmur gallop or rub normal S1 and S2 Abdomen: negative abdominal pain, nondistended, positive soft, bowel sounds, no rebound, no ascites, no appreciable mass Extremities: No significant cyanosis, clubbing, or edema bilateral lower extremities Skin: Negative rashes, lesions, ulcers Psychiatric:  Negative depression, negative anxiety, negative fatigue, negative mania  Central nervous system:  Cranial nerves II through XII intact, tongue/uvula midline, all extremities muscle strength 5/5, sensation intact throughout, negative dysarthria, negative expressive aphasia, negative receptive aphasia.  .     Data Reviewed: Care during the described time interval was provided by me .  I have reviewed this patient's available data, including medical history, events of note, physical examination, and all test results as part of my evaluation.   CBC: Recent Labs  Lab 12/20/18 2122  WBC 8.0  NEUTROABS 7.0  HGB 15.2  HCT 43.9  MCV 90.3  PLT 449   Basic Metabolic Panel: Recent Labs  Lab 12/20/18 2122  NA 133*  K 3.8  CL 98  CO2 22  GLUCOSE 93  BUN 17  CREATININE 1.02  CALCIUM 9.1   GFR: CrCl cannot be calculated (Unknown ideal weight.). Liver Function Tests: No results for input(s): AST, ALT, ALKPHOS, BILITOT, PROT, ALBUMIN in the last 168 hours. No results for input(s): LIPASE, AMYLASE in the last 168 hours. No results for input(s): AMMONIA in the last 168 hours. Coagulation Profile: No results for input(s): INR, PROTIME in the last 168 hours. Cardiac Enzymes: No results for input(s): CKTOTAL, CKMB, CKMBINDEX, TROPONINI in the last 168 hours. BNP (last 3 results) No results for input(s): PROBNP in the last 8760 hours. HbA1C: No results for input(s): HGBA1C in the last 72 hours. CBG: No  results for input(s): GLUCAP in the last 168 hours. Lipid Profile: No results for input(s): CHOL, HDL, LDLCALC, TRIG, CHOLHDL, LDLDIRECT in the last 72 hours. Thyroid Function Tests: No results for input(s): TSH, T4TOTAL, FREET4, T3FREE, THYROIDAB in the last 72 hours. Anemia Panel: No results for input(s): VITAMINB12, FOLATE, FERRITIN, TIBC, IRON, RETICCTPCT in the last 72 hours. Urine analysis:    Component Value Date/Time   COLORURINE YELLOW 08/01/2018 0811   APPEARANCEUR CLEAR 08/01/2018 0811   LABSPEC 1.015 08/01/2018 0811   PHURINE 6.0 08/01/2018 0811   GLUCOSEU NEGATIVE 08/01/2018 0811   HGBUR NEGATIVE 08/01/2018 0811   BILIRUBINUR NEGATIVE 08/01/2018 0811   KETONESUR NEGATIVE 08/01/2018 0811   PROTEINUR NEGATIVE 05/07/2018 0912   UROBILINOGEN 0.2 08/01/2018 0811   NITRITE NEGATIVE 08/01/2018 0811   LEUKOCYTESUR NEGATIVE 08/01/2018 0811   Sepsis Labs: @LABRCNTIP (procalcitonin:4,lacticidven:4)  ) Recent Results (from the past 240 hour(s))  Novel Coronavirus, NAA ([facility])     Status: Abnormal   Collection Time: 12/11/18  3:25 PM  Result Value Ref Range Status   SARS-CoV-2, NAA Detected (A) Not Detected Final    Comment: Testing was performed using  the cobas(R) SARS-CoV-2 test. This test was developed and its performance characteristics determined by [employer]. This test has not been FDA cleared or approved. This test has been authorized by FDA under an Emergency Use Authorization (EUA). This test is only authorized for the duration of time the declaration that circumstances exist justifying the authorization of the emergency use of in vitro diagnostic tests for detection of SARS-CoV-2 virus and/or diagnosis of COVID-19 infection under section 564(b)(1) of the Act, 21 U.S.C. 628MNO-1(R)(7), unless the authorization is terminated or revoked sooner. When diagnostic testing is negative, the possibility of a false negative result should be considered in the  context of a patient's recent exposures and the presence of clinical signs and symptoms consistent with COVID-19. An individual without symptoms of COVID-19 and who is not shedding SARS-CoV-2 virus would expect to have a negati ve (not detected) result in this assay.          Radiology Studies: Dg Chest Portable 1 View  Result Date: 12/20/2018 CLINICAL DATA:  58 year old who tested positive for COVID-19 approximately 2 weeks ago, presenting with fever, cough and diarrhea which have shown some improvement, though shortness of breath has worsened. Hypoxemia. EXAM: PORTABLE CHEST 1 VIEW COMPARISON:  04/17/2017 and earlier. FINDINGS: Cardiac silhouette normal in size for AP portable technique. Patchy ground-glass airspace opacities in both lungs, LEFT greater than RIGHT. Pulmonary vascularity normal. No visible pleural effusions. IMPRESSION: Bilateral pneumonia, LEFT greater than RIGHT, likely viral. Electronically Signed   By: Evangeline Dakin M.D.   On: 12/20/2018 21:26        Scheduled Meds: . enoxaparin (LOVENOX) injection  40 mg Subcutaneous Q24H  . methylPREDNISolone (SOLU-MEDROL) injection  60 mg Intravenous Q12H  . rosuvastatin  20 mg Oral QHS   Continuous Infusions:   LOS: 1 day   The patient is critically ill with multiple organ systems failure and requires high complexity decision making for assessment and support, frequent evaluation and titration of therapies, application of advanced monitoring technologies and extensive interpretation of multiple databases. Critical Care Time devoted to patient care services described in this note  Time spent: 40 minutes     WOODS, Geraldo Docker, MD [facility] Pager [phone]  If 7PM-7AM, please contact night-coverage [email or web address] Password [facility] 12/21/2018, 9:31 AM

## 2018-12-21 NOTE — Progress Notes (Addendum)
Patient arrived to unit, A&Ox4, vitals stable remains on 2L, no c/o pain. Incentive spirometry of 2000. Pt ambulates in room, sitting in chair.  Discussed with patient sitting in chair for meals and most of day, sleeping in prone position.  Will continue to monitor.  Denies breakfast tray, ensure drink given.  Verified contact information for wife, Wife updated on transfer to [facility],

## 2018-12-21 NOTE — H&P (Signed)
History and Physical    Harry Carroll WGN:[id number] DOB: 01/12/1961 DOA: 12/20/2018  PCP: Biagio Borg, MD  Patient coming from: Home.  Chief Complaint: Shortness of breath.  HPI: Harry Carroll is a 58 y.o. male with history of hyperlipidemia presents to the ER because of shortness of breath.  Patient states he was diagnosed with COVID-19 about a week ago.  Patient symptoms started about 2 weeks ago with fever chills productive cough diarrhea and subsequently since symptoms persisted patient had COVID-19 test done which came positive.  Following which patient symptoms have greatly resolved but over the last 24 to 48 hours patient started becoming short of breath on minimal exertion.  Denies any swelling of the extremities chest pain.  ED Course: In the ER chest x-ray shows bilateral infiltrates concerning for pneumonia.  Patient was febrile with temperature around 100.4 F.  Labs show COVID-19 test was positive again.  WBC is 8 hemoglobin 15 creatinine 1.  Patient admitted for acute respiratory failure secondary to COVID pneumonia.  Review of Systems: As per HPI, rest all negative.   Past Medical History:  Diagnosis Date  . Arthritis   . Bipolar 1 disorder (HCC)    Dr Clovis Pu , pt denies  . Carotid stenosis   . Carpal tunnel syndrome    saw Dr Kristie Cowman , pt unaware  . Cervical spinal stenosis   . Colon polyps    adenomatous  . Complication of anesthesia    trouble placing catheter with last surgeery, had to get urologist  . Diverticulitis   . Diverticulosis   . Heart murmur    pt denies  . History of hiatal hernia    resolved  . Hyperlipemia   . Insomnia    related to pain  . Normal cardiac stress test    CP 01-2009---Nl ECHO and stress test neg  . Numbness of fingers of both hands    when sleeps, right hand worse than left  . PVC (premature ventricular contraction)    history of PVC noted while in [employer]  . Restless leg   . Sleep apnea    does not use cpap due to  does not like    Past Surgical History:  Procedure Laterality Date  . COLONOSCOPY WITH PROPOFOL N/A 04/02/2014   Procedure: COLONOSCOPY WITH PROPOFOL;  Surgeon: Inda Castle, MD;  Location: [facility];  Service: Endoscopy;  Laterality: N/A;  . CYSTOSCOPY WITH URETHRAL DILATATION  12/20/2011   Procedure: CYSTOSCOPY WITH URETHRAL DILATATION;  Surgeon: Earnstine Regal, MD;  Location: [facility];  Service: General;;  with foley insertion  . CYSTOSCOPY WITH URETHRAL DILATATION  12/20/2011   Procedure: CYSTOSCOPY WITH URETHRAL DILATATION;  Surgeon: Franchot Gallo, MD;  Location: [facility];  Service: Urology;;  . Consuela Mimes WITH URETHRAL DILATATION N/A 12/15/2017   Procedure: CYSTOSCOPY WITH URETHRAL DILATATION;  Surgeon: Irine Seal, MD;  Location: [facility];  Service: Urology;  Laterality: N/A;  . HOT HEMOSTASIS N/A 04/02/2014   Procedure: HOT HEMOSTASIS (ARGON PLASMA COAGULATION/BICAP);  Surgeon: Inda Castle, MD;  Location: [facility];  Service: Endoscopy;  Laterality: N/A;  . maxillofacial surgery    . PARTIAL COLECTOMY  12/20/2011   Procedure: PARTIAL COLECTOMY;  Surgeon: Earnstine Regal, MD;  Location: [facility];  Service: General;  Laterality: N/A;  Sigmoid colectomy  . right knee arthroscopy     2007 x2  . right shoulder surgery     X2  . SHOULDER SURGERY  LEFT  . TOTAL KNEE ARTHROPLASTY Left 12/15/2017   Procedure: LEFT TOTAL KNEE ARTHROPLASTY;  Surgeon: Sydnee Cabal, MD;  Location: [facility];  Service: Orthopedics;  Laterality: Left;  Adductor Block  . TOTAL KNEE ARTHROPLASTY Right 05/18/2018   Procedure: TOTAL KNEE ARTHROPLASTY;  Surgeon: Sydnee Cabal, MD;  Location: [facility];  Service: Orthopedics;  Laterality: Right;  with block     reports that he has been smoking cigars. His smokeless tobacco use includes snuff. He reports current alcohol use. He reports that he does not use drugs.  Allergies  Allergen Reactions  . Ciprofloxacin Shortness Of Breath, Nausea And Vomiting, Nausea Only,  Rash and Other (See Comments)    Marked mucous production  . Dilaudid [Hydromorphone Hcl] Shortness Of Breath, Nausea Only and Other (See Comments)    Patient developed rash, excessive mucous production and chest pain - tolerates morphine  . Hydrocodone Itching    Family History  Problem Relation Age of Onset  . Prostate cancer Paternal Grandfather   . Cancer Paternal Aunt        lymph cancer  . Alcohol abuse Father   . Alcohol abuse Sister   . Colon cancer Neg Hx   . CAD Neg Hx   . Pancreatic cancer Neg Hx   . Rectal cancer Neg Hx   . Stomach cancer Neg Hx   . Drug abuse Neg Hx   . Anxiety disorder Neg Hx   . Bipolar disorder Neg Hx   . Depression Neg Hx     Prior to Admission medications   Medication Sig Start Date End Date Taking? Authorizing Provider  acetaminophen (TYLENOL) 500 MG tablet Take 1,000 mg by mouth every 6 (six) hours as needed for mild pain or fever.    Yes [provider]  rosuvastatin (CRESTOR) 20 MG tablet TAKE 1 TABLET BY MOUTH EVERY DAY Patient taking differently: Take 20 mg by mouth at bedtime.  08/27/18  Yes Biagio Borg, MD  Diclofenac Sodium (PENNSAID) 2 % SOLN Place 1 application onto the skin 2 (two) times daily. Patient not taking: Reported on 12/20/2018 08/03/18   Rosemarie Ax, MD    Physical Exam: Constitutional: Moderately built and nourished. Vitals:   12/20/18 2330 12/20/18 2345 12/21/18 0000 12/21/18 0015  BP: (!) 125/98 (!) 148/90 136/86 130/85  Pulse: 88 96 92 96  Resp: 15 17 16  (!) 22  Temp:      SpO2: 93% 92% 93% 94%   Eyes: Anicteric no pallor. ENMT: No discharge from the ears eyes nose or mouth. Neck: No mass felt.  No neck rigidity. Respiratory: No rhonchi or crepitations. Cardiovascular: S1-S2 heard. Abdomen: Soft nontender bowel sounds present. Musculoskeletal: No edema.  No joint effusion. Skin: No rash. Neurologic: Alert awake oriented to time place and person.  Moves all extremities. Psychiatric: Appears  normal.  Normal affect.   Labs on Admission: I have personally reviewed following labs and imaging studies  CBC: Recent Labs  Lab 12/20/18 2122  WBC 8.0  NEUTROABS 7.0  HGB 15.2  HCT 43.9  MCV 90.3  PLT 379   Basic Metabolic Panel: Recent Labs  Lab 12/20/18 2122  NA 133*  K 3.8  CL 98  CO2 22  GLUCOSE 93  BUN 17  CREATININE 1.02  CALCIUM 9.1   GFR: CrCl cannot be calculated (Unknown ideal weight.). Liver Function Tests: No results for input(s): AST, ALT, ALKPHOS, BILITOT, PROT, ALBUMIN in the last 168 hours. No results for input(s): LIPASE, AMYLASE in  the last 168 hours. No results for input(s): AMMONIA in the last 168 hours. Coagulation Profile: No results for input(s): INR, PROTIME in the last 168 hours. Cardiac Enzymes: No results for input(s): CKTOTAL, CKMB, CKMBINDEX, TROPONINI in the last 168 hours. BNP (last 3 results) No results for input(s): PROBNP in the last 8760 hours. HbA1C: No results for input(s): HGBA1C in the last 72 hours. CBG: No results for input(s): GLUCAP in the last 168 hours. Lipid Profile: No results for input(s): CHOL, HDL, LDLCALC, TRIG, CHOLHDL, LDLDIRECT in the last 72 hours. Thyroid Function Tests: No results for input(s): TSH, T4TOTAL, FREET4, T3FREE, THYROIDAB in the last 72 hours. Anemia Panel: No results for input(s): VITAMINB12, FOLATE, FERRITIN, TIBC, IRON, RETICCTPCT in the last 72 hours. Urine analysis:    Component Value Date/Time   COLORURINE YELLOW 08/01/2018 0811   APPEARANCEUR CLEAR 08/01/2018 0811   LABSPEC 1.015 08/01/2018 0811   PHURINE 6.0 08/01/2018 0811   GLUCOSEU NEGATIVE 08/01/2018 0811   HGBUR NEGATIVE 08/01/2018 0811   BILIRUBINUR NEGATIVE 08/01/2018 0811   KETONESUR NEGATIVE 08/01/2018 0811   PROTEINUR NEGATIVE 05/07/2018 0912   UROBILINOGEN 0.2 08/01/2018 0811   NITRITE NEGATIVE 08/01/2018 0811   LEUKOCYTESUR NEGATIVE 08/01/2018 0811   Sepsis Labs: @LABRCNTIP (procalcitonin:4,lacticidven:4) )  Recent Results (from the past 240 hour(s))  Novel Coronavirus, NAA ([facility])     Status: Abnormal   Collection Time: 12/11/18  3:25 PM  Result Value Ref Range Status   SARS-CoV-2, NAA Detected (A) Not Detected Final    Comment: Testing was performed using the cobas(R) SARS-CoV-2 test. This test was developed and its performance characteristics determined by [employer]. This test has not been FDA cleared or approved. This test has been authorized by FDA under an Emergency Use Authorization (EUA). This test is only authorized for the duration of time the declaration that circumstances exist justifying the authorization of the emergency use of in vitro diagnostic tests for detection of SARS-CoV-2 virus and/or diagnosis of COVID-19 infection under section 564(b)(1) of the Act, 21 U.S.C. 025ENI-7(P)(8), unless the authorization is terminated or revoked sooner. When diagnostic testing is negative, the possibility of a false negative result should be considered in the context of a patient's recent exposures and the presence of clinical signs and symptoms consistent with COVID-19. An individual without symptoms of COVID-19 and who is not shedding SARS-CoV-2 virus would expect to have a negati ve (not detected) result in this assay.      Radiological Exams on Admission: Dg Chest Portable 1 View  Result Date: 12/20/2018 CLINICAL DATA:  58 year old who tested positive for COVID-19 approximately 2 weeks ago, presenting with fever, cough and diarrhea which have shown some improvement, though shortness of breath has worsened. Hypoxemia. EXAM: PORTABLE CHEST 1 VIEW COMPARISON:  04/17/2017 and earlier. FINDINGS: Cardiac silhouette normal in size for AP portable technique. Patchy ground-glass airspace opacities in both lungs, LEFT greater than RIGHT. Pulmonary vascularity normal. No visible pleural effusions. IMPRESSION: Bilateral pneumonia, LEFT greater than RIGHT, likely viral. Electronically  Signed   By: Evangeline Dakin M.D.   On: 12/20/2018 21:26     Assessment/Plan Principal Problem:   Acute respiratory failure with hypoxia (HCC) Active Problems:   COVID-19 virus infection    1. Acute respiratory failure with hypoxia secondary to COVID-19 pneumonia.  I have started patient on Solu-Medrol.  Patient also received antibiotics in the ER for community-acquired pneumonia.  Will check procalcitonin if elevated may continue antibiotics follow cultures.  Will discuss with pharmacy about considering Remdesivir.  2. Hyperlipidemia on statins.  Given the complexity of the condition including persistent hypoxia with minimal exertion and patient turning her to be COVID-19 positive will need inpatient status.   DVT prophylaxis: Lovenox. Code Status: Full code. Family Communication: Discussed with patient.  Patient states he is already informed his wife. Disposition Plan: Home. Consults called: None. Admission status: Inpatient.   Rise Patience MD [facility] Pager [phone].  If 7PM-7AM, please contact night-coverage [email or web address] Password [facility]  12/21/2018, 12:53 AM

## 2018-12-22 DIAGNOSIS — F3111 Bipolar disorder, current episode manic without psychotic features, mild: Secondary | ICD-10-CM

## 2018-12-22 DIAGNOSIS — R0602 Shortness of breath: Secondary | ICD-10-CM

## 2018-12-22 LAB — COMPREHENSIVE METABOLIC PANEL
ALT: 72 U/L — ABNORMAL HIGH (ref 0–44)
AST: 61 U/L — ABNORMAL HIGH (ref 15–41)
Albumin: 3.4 g/dL — ABNORMAL LOW (ref 3.5–5.0)
Alkaline Phosphatase: 51 U/L (ref 38–126)
Anion gap: 12 (ref 5–15)
BUN: 25 mg/dL — ABNORMAL HIGH (ref 6–20)
CO2: 24 mmol/L (ref 22–32)
Calcium: 9.3 mg/dL (ref 8.9–10.3)
Chloride: 99 mmol/L (ref 98–111)
Creatinine, Ser: 0.82 mg/dL (ref 0.61–1.24)
GFR calc Af Amer: >60 mL/min (ref >60)
GFR calc non Af Amer: >60 mL/min (ref >60)
Glucose, Bld: 154 mg/dL — ABNORMAL HIGH (ref 70–99)
Potassium: 4.2 mmol/L (ref 3.5–5.1)
Sodium: 135 mmol/L (ref 135–145)
Total Bilirubin: 0.5 mg/dL (ref 0.3–1.2)
Total Protein: 7.3 g/dL (ref 6.5–8.1)

## 2018-12-22 LAB — RESPIRATORY PANEL BY PCR

## 2018-12-22 LAB — CBC WITH DIFFERENTIAL/PLATELET
Abs Immature Granulocytes: 0.16 10*3/uL — ABNORMAL HIGH (ref 0.00–0.07)
Basophils Absolute: 0.1 10*3/uL (ref 0.0–0.1)
Basophils Relative: 0 %
Eosinophils Absolute: 0 10*3/uL (ref 0.0–0.5)
Eosinophils Relative: 0 %
HCT: 42.1 % (ref 39.0–52.0)
Hemoglobin: 14.4 g/dL (ref 13.0–17.0)
Immature Granulocytes: 1 %
Lymphocytes Relative: 8 %
Lymphs Abs: 1.1 10*3/uL (ref 0.7–4.0)
MCH: 31 pg (ref 26.0–34.0)
MCHC: 34.2 g/dL (ref 30.0–36.0)
MCV: 90.5 fL (ref 80.0–100.0)
Monocytes Absolute: 0.6 10*3/uL (ref 0.1–1.0)
Monocytes Relative: 4 %
Neutro Abs: 12.1 10*3/uL — ABNORMAL HIGH (ref 1.7–7.7)
Neutrophils Relative %: 87 %
Platelets: 327 10*3/uL (ref 150–400)
RBC: 4.65 MIL/uL (ref 4.22–5.81)
RDW: 13.3 % (ref 11.5–15.5)
WBC: 14 10*3/uL — ABNORMAL HIGH (ref 4.0–10.5)
nRBC: 0 % (ref 0.0–0.2)

## 2018-12-22 LAB — INTERLEUKIN-6, PLASMA: Interleukin-6, Plasma: 6.6 pg/mL (ref 0.0–12.2)

## 2018-12-22 LAB — HIV ANTIBODY (ROUTINE TESTING W REFLEX): HIV Screen 4th Generation wRfx: NONREACTIVE

## 2018-12-22 LAB — LIPID PANEL
Cholesterol: 106 mg/dL (ref 0–200)
HDL: 31 mg/dL — ABNORMAL LOW (ref >40)
LDL Cholesterol: 54 mg/dL (ref 0–99)
Total CHOL/HDL Ratio: 3.4 RATIO
Triglycerides: 104 mg/dL (ref <150)
VLDL: 21 mg/dL (ref 0–40)

## 2018-12-22 LAB — FERRITIN: Ferritin: 1079 ng/mL — ABNORMAL HIGH (ref 24–336)

## 2018-12-22 LAB — C-REACTIVE PROTEIN: CRP: 17.1 mg/dL — ABNORMAL HIGH (ref <1.0)

## 2018-12-22 LAB — D-DIMER, QUANTITATIVE: D-Dimer, Quant: 1.55 ug/mL-FEU — ABNORMAL HIGH (ref 0.00–0.50)

## 2018-12-22 LAB — HEPATITIS B SURFACE ANTIGEN: Hepatitis B Surface Ag: NEGATIVE

## 2018-12-22 LAB — STREP PNEUMONIAE URINARY ANTIGEN: Strep Pneumo Urinary Antigen: NEGATIVE

## 2018-12-22 LAB — MAGNESIUM: Magnesium: 2.4 mg/dL (ref 1.7–2.4)

## 2018-12-22 LAB — CK: Total CK: 76 U/L (ref 49–397)

## 2018-12-22 LAB — PHOSPHORUS: Phosphorus: 4.1 mg/dL (ref 2.5–4.6)

## 2018-12-22 NOTE — Progress Notes (Signed)
PROGRESS NOTE    Harry Carroll  SKA:[id number] DOB: February 19, 1961 DOA: 12/20/2018 PCP: Biagio Borg, MD   Brief Narrative:  58 y.WM  PMHx depression, bipolar disorder unspecified , H LD, OSA, diverticulitis, Hx colonic polyps, insomnia, obesity  Presents to the ER because of shortness of breath.  Patient states he was diagnosed with COVID-19 about a week ago.  Patient symptoms started about 2 weeks ago with fever chills productive cough diarrhea and subsequently since symptoms persisted patient had COVID-19 test done which came positive.  Following which patient symptoms have greatly resolved but over the last 24 to 48 hours patient started becoming short of breath on minimal exertion.  Denies any swelling of the extremities chest pain.  ED Course: In the ER chest x-ray shows bilateral infiltrates concerning for pneumonia.  Patient was febrile with temperature around 100.4 F.  Labs show COVID-19 test was positive again.  WBC is 8 hemoglobin 15 creatinine 1.  Patient admitted for acute respiratory failure secondary to COVID pneumonia.    Subjective: 7/11 A/O x4, positive S OB, negative CP, negative abdominal pain.  States is beginning to feel better   Assessment & Plan:   Principal Problem:   Acute respiratory failure with hypoxia ([place]) Active Problems:   BIPOLAR DISORDER UNSPECIFIED   INSOMNIA   OSA (obstructive sleep apnea)   History of colonic polyps   Obesity   COVID-19 virus infection   Depression  Acute respiratory failure with hypoxia/COVID 19 pneumonia - Solu-Medrol 40 mg 3 times daily - Patient meets criteria for Remdesivir requiring 2 L O2 via [place] SPO2 89%, - Remdesivir per pharmacy protocol -Patient counseled that if his respiratory status deteriorates or certain inflammatory markers are elevated will start Actemra, patient in agreement with treatment plan. - COVID 19 inflammatory markers pending - Titrate O2 to maintain SPO2> 93% - Currently patient not requiring  pronating however if patient's respiratory status begins to decline prone patient aggressively 2 to 3 hours every 12 hours - Flutter valve Recent Labs  Lab 12/21/18 1040 12/22/18 0525  CRP 22.8* 17.1*    OSA -Required at night use high flow nasal cannula to maintain SPO2> 93%  Depression/bipolar unspecified -Patient not on medication for depression or bipolar disease.  Stable  HLD - Crestor 20 mg daily - 7/11 lipid panel within AHA/ADA guidelines  Obesity     DVT prophylaxis: Lovenox Code Status: Full Family Communication: None Disposition Plan: TBD   Consultants:    Procedures/Significant Events:  7/9 PCXR bilateral patchy opacification LEFT>> RIGHT.  Consistent with pneumonia most likely viral   I have personally reviewed and interpreted all radiology studies and my findings are as above.  VENTILATOR SETTINGS:    Cultures 6/30 Novel coronavirus positive    Antimicrobials: Anti-infectives (From admission, onward)   Start     Stop   12/22/18 1530  remdesivir 100 mg in sodium chloride 0.9 % 250 mL IVPB     12/26/18 1529   12/21/18 1530  remdesivir 200 mg in sodium chloride 0.9 % 250 mL IVPB         12/20/18 2230  cefTRIAXone (ROCEPHIN) 1 g in sodium chloride 0.9 % 100 mL IVPB     12/20/18 2325   12/20/18 2230  azithromycin (ZITHROMAX) 500 mg in sodium chloride 0.9 % 250 mL IVPB     12/21/18 0146       Devices    LINES / TUBES:      Continuous Infusions: . remdesivir 100 mg  in NS 250 mL       Objective: Vitals:   12/21/18 1538 12/21/18 1627 12/22/18 0200 12/22/18 0939  BP:  120/86 125/84   Pulse:  97    Resp:  16    Temp: 98.5 F (36.9 C) 98.1 F (36.7 C)  98.3 F (36.8 C)  TempSrc: Oral Oral  Oral  SpO2:  (!) 88%      Intake/Output Summary (Last 24 hours) at 12/22/2018 0946 Last data filed at 12/22/2018 0200 Gross per 24 hour  Intake 1040 ml  Output 1100 ml  Net -60 ml   There were no vitals filed for this visit.   Physical Exam:  General: A/O x4, positive  acute respiratory distress Eyes: negative scleral hemorrhage, negative anisocoria, negative icterus ENT: Negative Runny nose, negative gingival bleeding, Neck:  Negative scars, masses, torticollis, lymphadenopathy, JVD Lungs: Clear to auscultation bilaterally without wheezes or crackles Cardiovascular: Regular rate and rhythm without murmur gallop or rub normal S1 and S2 Abdomen: negative abdominal pain, nondistended, positive soft, bowel sounds, no rebound, no ascites, no appreciable mass Extremities: No significant cyanosis, clubbing, or edema bilateral lower extremities Skin: Negative rashes, lesions, ulcers Psychiatric:  Negative depression, negative anxiety, negative fatigue, negative mania  Central nervous system:  Cranial nerves II through XII intact, tongue/uvula midline, all extremities muscle strength 5/5, sensation intact throughout, negative dysarthria, negative expressive aphasia, negative receptive aphasia.  .     Data Reviewed: Care during the described time interval was provided by me .  I have reviewed this patient's available data, including medical history, events of note, physical examination, and all test results as part of my evaluation.   CBC: Recent Labs  Lab 12/20/18 2122 12/21/18 1040 12/22/18 0525  WBC 8.0 5.4 14.0*  NEUTROABS 7.0 4.8 12.1*  HGB 15.2 14.9 14.4  HCT 43.9 43.4 42.1  MCV 90.3 90.4 90.5  PLT 233 267 353   Basic Metabolic Panel: Recent Labs  Lab 12/20/18 2122 12/21/18 1040 12/22/18 0525  NA 133* 133* 135  K 3.8 4.2 4.2  CL 98 97* 99  CO2 22 20* 24  GLUCOSE 93 251* 154*  BUN 17 20 25*  CREATININE 1.02 0.84 0.82  CALCIUM 9.1 9.0 9.3  MG  --  2.2 2.4  PHOS  --   --  4.1   GFR: CrCl cannot be calculated (Unknown ideal weight.). Liver Function Tests: Recent Labs  Lab 12/21/18 1040 12/22/18 0525  AST 67* 61*  ALT 63* 72*  ALKPHOS 55 51  BILITOT 0.5 0.5  PROT 7.4 7.3  ALBUMIN 3.4*  3.4*   No results for input(s): LIPASE, AMYLASE in the last 168 hours. No results for input(s): AMMONIA in the last 168 hours. Coagulation Profile: No results for input(s): INR, PROTIME in the last 168 hours. Cardiac Enzymes: Recent Labs  Lab 12/22/18 0525  CKTOTAL 76   BNP (last 3 results) No results for input(s): PROBNP in the last 8760 hours. HbA1C: No results for input(s): HGBA1C in the last 72 hours. CBG: No results for input(s): GLUCAP in the last 168 hours. Lipid Profile: Recent Labs    12/22/18 0525  CHOL 106  HDL 31*  LDLCALC 54  TRIG 104  CHOLHDL 3.4   Thyroid Function Tests: No results for input(s): TSH, T4TOTAL, FREET4, T3FREE, THYROIDAB in the last 72 hours. Anemia Panel: Recent Labs    12/21/18 1040 12/22/18 0525  FERRITIN 1,423* 1,079*   Urine analysis:    Component Value Date/Time   COLORURINE  YELLOW 08/01/2018 [place] 08/01/2018 0811   LABSPEC 1.015 08/01/2018 0811   PHURINE 6.0 08/01/2018 0811   GLUCOSEU NEGATIVE 08/01/2018 0811   HGBUR NEGATIVE 08/01/2018 0811   BILIRUBINUR NEGATIVE 08/01/2018 0811   KETONESUR NEGATIVE 08/01/2018 0811   PROTEINUR NEGATIVE 05/07/2018 0912   UROBILINOGEN 0.2 08/01/2018 0811   NITRITE NEGATIVE 08/01/2018 0811   LEUKOCYTESUR NEGATIVE 08/01/2018 0811   Sepsis Labs: @LABRCNTIP (procalcitonin:4,lacticidven:4)  ) Recent Results (from the past 240 hour(s))  Respiratory Panel by PCR     Status: None   Collection Time: 12/21/18  9:40 PM   Specimen: Nasopharyngeal Swab; Respiratory  Result Value Ref Range Status   Adenovirus NOT DETECTED NOT DETECTED Final   Coronavirus 229E NOT DETECTED NOT DETECTED Final    Comment: (NOTE) The Coronavirus on the Respiratory Panel, DOES NOT test for the novel  Coronavirus (2019 nCoV)    Coronavirus HKU1 NOT DETECTED NOT DETECTED Final   Coronavirus NL63 NOT DETECTED NOT DETECTED Final   Coronavirus OC43 NOT DETECTED NOT DETECTED Final   Metapneumovirus  NOT DETECTED NOT DETECTED Final   Rhinovirus / Enterovirus NOT DETECTED NOT DETECTED Final   Influenza A NOT DETECTED NOT DETECTED Final   Influenza B NOT DETECTED NOT DETECTED Final   Parainfluenza Virus 1 NOT DETECTED NOT DETECTED Final   Parainfluenza Virus 2 NOT DETECTED NOT DETECTED Final   Parainfluenza Virus 3 NOT DETECTED NOT DETECTED Final   Parainfluenza Virus 4 NOT DETECTED NOT DETECTED Final   Respiratory Syncytial Virus NOT DETECTED NOT DETECTED Final   Bordetella pertussis NOT DETECTED NOT DETECTED Final   Chlamydophila pneumoniae NOT DETECTED NOT DETECTED Final   Mycoplasma pneumoniae NOT DETECTED NOT DETECTED Final    Comment: Performed at [facility], [place] [address], [place],  [address]         Radiology Studies: Dg Chest Portable 1 View  Result Date: 12/20/2018 CLINICAL DATA:  58 year old who tested positive for COVID-19 approximately 2 weeks ago, presenting with fever, cough and diarrhea which have shown some improvement, though shortness of breath has worsened. Hypoxemia. EXAM: PORTABLE CHEST 1 VIEW COMPARISON:  04/17/2017 and earlier. FINDINGS: Cardiac silhouette normal in size for AP portable technique. Patchy ground-glass airspace opacities in both lungs, LEFT greater than RIGHT. Pulmonary vascularity normal. No visible pleural effusions. IMPRESSION: Bilateral pneumonia, LEFT greater than RIGHT, likely viral. Electronically Signed   By: Evangeline Dakin M.D.   On: 12/20/2018 21:26        Scheduled Meds: . enoxaparin (LOVENOX) injection  40 mg Subcutaneous Q24H  . folic acid  1 mg Oral Daily  . Ipratropium-Albuterol  1 puff Inhalation Q6H  . mouth rinse  15 mL Mouth Rinse BID  . methylPREDNISolone (SOLU-MEDROL) injection  40 mg Intravenous TID  . rosuvastatin  20 mg Oral QHS  . thiamine  100 mg Oral Daily  . vitamin C  500 mg Oral Daily  . zinc sulfate  220 mg Oral Daily   Continuous Infusions: . remdesivir 100 mg in NS 250 mL       LOS:  2 days   The patient is critically ill with multiple organ systems failure and requires high complexity decision making for assessment and support, frequent evaluation and titration of therapies, application of advanced monitoring technologies and extensive interpretation of multiple databases. Critical Care Time devoted to patient care services described in this note  Time spent: 40 minutes     , Geraldo Docker, MD [facility] Pager  [phone]  If 7PM-7AM, please contact night-coverage [email or web address] Password [facility] 12/22/2018, 9:46 AM

## 2018-12-23 DIAGNOSIS — F5101 Primary insomnia: Secondary | ICD-10-CM

## 2018-12-23 DIAGNOSIS — F310 Bipolar disorder, current episode hypomanic: Secondary | ICD-10-CM

## 2018-12-23 DIAGNOSIS — Z86010 Personal history of colonic polyps: Secondary | ICD-10-CM

## 2018-12-23 LAB — CBC WITH DIFFERENTIAL/PLATELET
Abs Immature Granulocytes: 0.44 10*3/uL — ABNORMAL HIGH (ref 0.00–0.07)
Basophils Absolute: 0.1 10*3/uL (ref 0.0–0.1)
Basophils Relative: 0 %
Eosinophils Absolute: 0 10*3/uL (ref 0.0–0.5)
Eosinophils Relative: 0 %
HCT: 41 % (ref 39.0–52.0)
Hemoglobin: 13.7 g/dL (ref 13.0–17.0)
Immature Granulocytes: 2 %
Lymphocytes Relative: 6 %
Lymphs Abs: 1.1 10*3/uL (ref 0.7–4.0)
MCH: 30.6 pg (ref 26.0–34.0)
MCHC: 33.4 g/dL (ref 30.0–36.0)
MCV: 91.7 fL (ref 80.0–100.0)
Monocytes Absolute: 1 10*3/uL (ref 0.1–1.0)
Monocytes Relative: 5 %
Neutro Abs: 17.3 10*3/uL — ABNORMAL HIGH (ref 1.7–7.7)
Neutrophils Relative %: 87 %
Platelets: 401 10*3/uL — ABNORMAL HIGH (ref 150–400)
RBC: 4.47 MIL/uL (ref 4.22–5.81)
RDW: 13.1 % (ref 11.5–15.5)
WBC: 19.9 10*3/uL — ABNORMAL HIGH (ref 4.0–10.5)
nRBC: 0 % (ref 0.0–0.2)

## 2018-12-23 LAB — COMPREHENSIVE METABOLIC PANEL
ALT: 115 U/L — ABNORMAL HIGH (ref 0–44)
AST: 70 U/L — ABNORMAL HIGH (ref 15–41)
Albumin: 3.5 g/dL (ref 3.5–5.0)
Alkaline Phosphatase: 55 U/L (ref 38–126)
Anion gap: 13 (ref 5–15)
BUN: 34 mg/dL — ABNORMAL HIGH (ref 6–20)
CO2: 23 mmol/L (ref 22–32)
Calcium: 9.7 mg/dL (ref 8.9–10.3)
Chloride: 100 mmol/L (ref 98–111)
Creatinine, Ser: 0.88 mg/dL (ref 0.61–1.24)
GFR calc Af Amer: >60 mL/min (ref >60)
GFR calc non Af Amer: >60 mL/min (ref >60)
Glucose, Bld: 161 mg/dL — ABNORMAL HIGH (ref 70–99)
Potassium: 4.3 mmol/L (ref 3.5–5.1)
Sodium: 136 mmol/L (ref 135–145)
Total Bilirubin: 0.4 mg/dL (ref 0.3–1.2)
Total Protein: 7.2 g/dL (ref 6.5–8.1)

## 2018-12-23 LAB — PHOSPHORUS: Phosphorus: 3.6 mg/dL (ref 2.5–4.6)

## 2018-12-23 LAB — MAGNESIUM: Magnesium: 2.3 mg/dL (ref 1.7–2.4)

## 2018-12-23 LAB — CK: Total CK: 57 U/L (ref 49–397)

## 2018-12-23 LAB — C-REACTIVE PROTEIN: CRP: 8.2 mg/dL — ABNORMAL HIGH (ref <1.0)

## 2018-12-23 LAB — FERRITIN: Ferritin: 1301 ng/mL — ABNORMAL HIGH (ref 24–336)

## 2018-12-23 LAB — GLUCOSE 6 PHOSPHATE DEHYDROGENASE
G6PDH: 7.7 U/g{Hb} (ref 4.6–13.5)
Hemoglobin: 15 g/dL (ref 13.0–17.7)

## 2018-12-23 LAB — D-DIMER, QUANTITATIVE: D-Dimer, Quant: 1.15 ug/mL-FEU — ABNORMAL HIGH (ref 0.00–0.50)

## 2018-12-23 MED ORDER — ATROPINE SULFATE 1 MG/10ML IJ SOSY
0.5000 mg | PREFILLED_SYRINGE | Freq: Once | INTRAMUSCULAR | Status: DC
  Filled 2018-12-23: qty 10

## 2018-12-23 NOTE — Plan of Care (Addendum)
Dr. Text to inform of mutiple mini-pauses in HR, plus 1 over 5 seconds.  Patient asymptomatic.  Crash cart outside room with pacer pads and atropine available (if becomes symptomatic - per Dr. Request.)  Charge RN informed.

## 2018-12-23 NOTE — Progress Notes (Signed)
PROGRESS NOTE    Harry Carroll  BDZ:[id number] DOB: Dec 28, 1960 DOA: 12/20/2018 PCP: Biagio Borg, MD   Brief Narrative:  49 y.WM  PMHx depression, bipolar disorder unspecified , H LD, OSA, diverticulitis, Hx colonic polyps, insomnia, obesity  Presents to the ER because of shortness of breath.  Patient states he was diagnosed with COVID-19 about a week ago.  Patient symptoms started about 2 weeks ago with fever chills productive cough diarrhea and subsequently since symptoms persisted patient had COVID-19 test done which came positive.  Following which patient symptoms have greatly resolved but over the last 24 to 48 hours patient started becoming short of breath on minimal exertion.  Denies any swelling of the extremities chest pain.  ED Course: In the ER chest x-ray shows bilateral infiltrates concerning for pneumonia.  Patient was febrile with temperature around 100.4 F.  Labs show COVID-19 test was positive again.  WBC is 8 hemoglobin 15 creatinine 1.  Patient admitted for acute respiratory failure secondary to COVID pneumonia.    Subjective: 7/11 A/O x4, positive S OB, negative CP, negative abdominal pain.  States is beginning to feel better   Assessment & Plan:   Principal Problem:   Acute respiratory failure with hypoxia ([place]) Active Problems:   BIPOLAR DISORDER UNSPECIFIED   INSOMNIA   OSA (obstructive sleep apnea)   History of colonic polyps   Obesity   COVID-19 virus infection   Depression  Acute respiratory failure with hypoxia/COVID 19 pneumonia - Solu-Medrol 40 mg 3 times daily - Patient meets criteria for Remdesivir requiring 2 L O2 via [place] SPO2 89%, - Remdesivir per pharmacy protocol -Patient counseled that if his respiratory status deteriorates or certain inflammatory markers are elevated will start Actemra, patient in agreement with treatment plan. - COVID 19 inflammatory markers pending - Titrate O2 to maintain SPO2> 93% - Currently patient not requiring  pronating however if patient's respiratory status begins to decline prone patient aggressively 2 to 3 hours every 12 hours - Flutter valve Recent Labs  Lab 12/21/18 1040 12/22/18 0525 12/23/18 0357  CRP 22.8* 17.1* 8.2*  -Currently on 2 L O2 via [place]: SPO2 95%  OSA -Required at night use high flow nasal cannula to maintain SPO2> 93%  Bradycardia/pauses  - Atropine to bedside - Patient at bedside  Depression/bipolar unspecified -Patient not on medication for depression or bipolar disease.  Stable  HLD - Crestor 20 mg daily - 7/11 lipid panel within AHA/ADA guidelines  Obesity     DVT prophylaxis: Lovenox Code Status: Full Family Communication: None Disposition Plan: TBD   Consultants:    Procedures/Significant Events:  7/9 PCXR bilateral patchy opacification LEFT>> RIGHT.  Consistent with pneumonia most likely viral   I have personally reviewed and interpreted all radiology studies and my findings are as above.  VENTILATOR SETTINGS:    Cultures 6/30 Novel coronavirus positive    Antimicrobials: Anti-infectives (From admission, onward)   Start     Stop   12/22/18 1530  remdesivir 100 mg in sodium chloride 0.9 % 250 mL IVPB     12/26/18 1529   12/21/18 1530  remdesivir 200 mg in sodium chloride 0.9 % 250 mL IVPB         12/20/18 2230  cefTRIAXone (ROCEPHIN) 1 g in sodium chloride 0.9 % 100 mL IVPB     12/20/18 2325   12/20/18 2230  azithromycin (ZITHROMAX) 500 mg in sodium chloride 0.9 % 250 mL IVPB     12/21/18 0146  Devices    LINES / TUBES:      Continuous Infusions: . remdesivir 100 mg in NS 250 mL Stopped (12/22/18 1930)     Objective: Vitals:   12/22/18 1440 12/22/18 1605 12/22/18 2113 12/23/18 0455  BP:  127/77 (!) 140/91   Pulse:  78 77   Resp:  (!) 22 19   Temp: 99 F (37.2 C) 98.4 F (36.9 C) 98.1 F (36.7 C) 97.6 F (36.4 C)  TempSrc: Oral Oral Oral Oral  SpO2:  94% 95%     Intake/Output Summary (Last 24 hours)  at 12/23/2018 1049 Last data filed at 12/22/2018 2155 Gross per 24 hour  Intake 775.02 ml  Output 1100 ml  Net -324.98 ml   There were no vitals filed for this visit.  Physical Exam:  General: A/O x4, positive no acute respiratory distress Currently on 2 L O2 via [place]: SPO2 95% Eyes: negative scleral hemorrhage, negative anisocoria, negative icterus ENT: Negative Runny nose, negative gingival bleeding, Neck:  Negative scars, masses, torticollis, lymphadenopathy, JVD Lungs: Clear to auscultation bilaterally without wheezes or crackles Cardiovascular: Regular rate and rhythm without murmur gallop or rub normal S1 and S2 Abdomen: negative abdominal pain, nondistended, positive soft, bowel sounds, no rebound, no ascites, no appreciable mass Extremities: No significant cyanosis, clubbing, or edema bilateral lower extremities Skin: Negative rashes, lesions, ulcers Psychiatric:  Negative depression, negative anxiety, negative fatigue, negative mania  Central nervous system:  Cranial nerves II through XII intact, tongue/uvula midline, all extremities muscle strength 5/5, sensation intact throughout, negative dysarthria, negative expressive aphasia, negative receptive aphasia.  .     Data Reviewed: Care during the described time interval was provided by me .  I have reviewed this patient's available data, including medical history, events of note, physical examination, and all test results as part of my evaluation.   CBC: Recent Labs  Lab 12/20/18 2122 12/21/18 1040 12/22/18 0525 12/23/18 0357  WBC 8.0 5.4 14.0* 19.9*  NEUTROABS 7.0 4.8 12.1* 17.3*  HGB 15.2 14.9  15.0 14.4 13.7  HCT 43.9 43.4 42.1 41.0  MCV 90.3 90.4 90.5 91.7  PLT 233 267 327 419*   Basic Metabolic Panel: Recent Labs  Lab 12/20/18 2122 12/21/18 1040 12/22/18 0525 12/23/18 0357  NA 133* 133* 135 136  K 3.8 4.2 4.2 4.3  CL 98 97* 99 100  CO2 22 20* 24 23  GLUCOSE 93 251* 154* 161*  BUN 17 20 25* 34*   CREATININE 1.02 0.84 0.82 0.88  CALCIUM 9.1 9.0 9.3 9.7  MG  --  2.2 2.4 2.3  PHOS  --   --  4.1 3.6   GFR: CrCl cannot be calculated (Unknown ideal weight.). Liver Function Tests: Recent Labs  Lab 12/21/18 1040 12/22/18 0525 12/23/18 0357  AST 67* 61* 70*  ALT 63* 72* 115*  ALKPHOS 55 51 55  BILITOT 0.5 0.5 0.4  PROT 7.4 7.3 7.2  ALBUMIN 3.4* 3.4* 3.5   No results for input(s): LIPASE, AMYLASE in the last 168 hours. No results for input(s): AMMONIA in the last 168 hours. Coagulation Profile: No results for input(s): INR, PROTIME in the last 168 hours. Cardiac Enzymes: Recent Labs  Lab 12/22/18 0525 12/23/18 0357  CKTOTAL 76 57   BNP (last 3 results) No results for input(s): PROBNP in the last 8760 hours. HbA1C: No results for input(s): HGBA1C in the last 72 hours. CBG: No results for input(s): GLUCAP in the last 168 hours. Lipid Profile: Recent Labs  12/22/18 0525  CHOL 106  HDL 31*  LDLCALC 54  TRIG 104  CHOLHDL 3.4   Thyroid Function Tests: No results for input(s): TSH, T4TOTAL, FREET4, T3FREE, THYROIDAB in the last 72 hours. Anemia Panel: Recent Labs    12/22/18 0525 12/23/18 0357  FERRITIN 1,079* 1,301*   Urine analysis:    Component Value Date/Time   COLORURINE YELLOW 08/01/2018 0811   APPEARANCEUR CLEAR 08/01/2018 0811   LABSPEC 1.015 08/01/2018 0811   PHURINE 6.0 08/01/2018 0811   GLUCOSEU NEGATIVE 08/01/2018 0811   HGBUR NEGATIVE 08/01/2018 0811   BILIRUBINUR NEGATIVE 08/01/2018 0811   KETONESUR NEGATIVE 08/01/2018 0811   PROTEINUR NEGATIVE 05/07/2018 0912   UROBILINOGEN 0.2 08/01/2018 0811   NITRITE NEGATIVE 08/01/2018 0811   LEUKOCYTESUR NEGATIVE 08/01/2018 0811   Sepsis Labs: @LABRCNTIP (procalcitonin:4,lacticidven:4)  ) Recent Results (from the past 240 hour(s))  Culture, blood (Routine X 2) w Reflex to ID Panel     Status: None (Preliminary result)   Collection Time: 12/21/18 10:40 AM   Specimen: BLOOD  Result Value Ref  Range Status   Specimen Description   Final    BLOOD RIGHT ARM Performed at [facility], [place] [address], [place], [place] [address]    Special Requests   Final    BOTTLES DRAWN AEROBIC ONLY Blood Culture adequate volume Performed at [place] [address], [place], [place] [address]    Culture   Final    NO GROWTH 1 DAY Performed at [facility], [place] [address] [place], [place] [address]    Report Status PENDING  Incomplete  Culture, blood (Routine X 2) w Reflex to ID Panel     Status: None (Preliminary result)   Collection Time: 12/21/18 10:50 AM   Specimen: BLOOD  Result Value Ref Range Status   Specimen Description   Final    BLOOD RIGHT HAND Performed at [place] [address] [place], [place] [address]    Special Requests   Final    BOTTLES DRAWN AEROBIC ONLY Blood Culture adequate volume Performed at [place] [address], [place], [place] [address]    Culture   Final    NO GROWTH 1 DAY Performed at [facility], [place] [address], [place], [place] [address]    Report Status PENDING  Incomplete  Respiratory Panel by PCR     Status: None   Collection Time: 12/21/18  9:40 PM   Specimen: Nasopharyngeal Swab; Respiratory  Result Value Ref Range Status   Adenovirus NOT DETECTED NOT DETECTED Final   Coronavirus 229E NOT DETECTED NOT DETECTED Final    Comment: (NOTE) The Coronavirus on the Respiratory Panel, DOES NOT test for the novel  Coronavirus (2019 nCoV)    Coronavirus HKU1 NOT DETECTED NOT DETECTED Final   Coronavirus NL63 NOT DETECTED NOT DETECTED Final   Coronavirus OC43 NOT DETECTED NOT DETECTED Final   Metapneumovirus NOT DETECTED NOT DETECTED Final   Rhinovirus / Enterovirus NOT DETECTED NOT DETECTED Final   Influenza A NOT DETECTED NOT DETECTED Final   Influenza B NOT DETECTED NOT DETECTED Final   Parainfluenza Virus 1 NOT DETECTED NOT DETECTED Final    Parainfluenza Virus 2 NOT DETECTED NOT DETECTED Final   Parainfluenza Virus 3 NOT DETECTED NOT DETECTED Final   Parainfluenza Virus 4 NOT DETECTED NOT DETECTED Final   Respiratory Syncytial Virus NOT DETECTED NOT DETECTED Final   Bordetella pertussis NOT DETECTED NOT DETECTED Final  Chlamydophila pneumoniae NOT DETECTED NOT DETECTED Final   Mycoplasma pneumoniae NOT DETECTED NOT DETECTED Final    Comment: Performed at [facility], [place] [address] [place], [place] [address]         Radiology Studies: No results found.      Scheduled Meds: . enoxaparin (LOVENOX) injection  40 mg Subcutaneous Q24H  . folic acid  1 mg Oral Daily  . Ipratropium-Albuterol  1 puff Inhalation Q6H  . mouth rinse  15 mL Mouth Rinse BID  . methylPREDNISolone (SOLU-MEDROL) injection  40 mg Intravenous TID  . rosuvastatin  20 mg Oral QHS  . thiamine  100 mg Oral Daily  . vitamin C  500 mg Oral Daily  . zinc sulfate  220 mg Oral Daily   Continuous Infusions: . remdesivir 100 mg in NS 250 mL Stopped (12/22/18 1930)     LOS: 3 days   The patient is critically ill with multiple organ systems failure and requires high complexity decision making for assessment and support, frequent evaluation and titration of therapies, application of advanced monitoring technologies and extensive interpretation of multiple databases. Critical Care Time devoted to patient care services described in this note  Time spent: 40 minutes     Natania Finigan, Geraldo Docker, MD [facility] Pager [phone]  If 7PM-7AM, please contact night-coverage [email or web address] Password [facility] 12/23/2018, 10:49 AM

## 2018-12-24 LAB — CBC WITH DIFFERENTIAL/PLATELET
Abs Immature Granulocytes: 0.82 10*3/uL — ABNORMAL HIGH (ref 0.00–0.07)
Basophils Absolute: 0.1 10*3/uL (ref 0.0–0.1)
Basophils Relative: 0 %
Eosinophils Absolute: 0 10*3/uL (ref 0.0–0.5)
Eosinophils Relative: 0 %
HCT: 40.6 % (ref 39.0–52.0)
Hemoglobin: 13.2 g/dL (ref 13.0–17.0)
Immature Granulocytes: 5 %
Lymphocytes Relative: 5 %
Lymphs Abs: 0.9 10*3/uL (ref 0.7–4.0)
MCH: 30.3 pg (ref 26.0–34.0)
MCHC: 32.5 g/dL (ref 30.0–36.0)
MCV: 93.3 fL (ref 80.0–100.0)
Monocytes Absolute: 0.7 10*3/uL (ref 0.1–1.0)
Monocytes Relative: 4 %
Neutro Abs: 15 10*3/uL — ABNORMAL HIGH (ref 1.7–7.7)
Neutrophils Relative %: 86 %
Platelets: 392 10*3/uL (ref 150–400)
RBC: 4.35 MIL/uL (ref 4.22–5.81)
RDW: 13.2 % (ref 11.5–15.5)
WBC: 17.5 10*3/uL — ABNORMAL HIGH (ref 4.0–10.5)
nRBC: 0 % (ref 0.0–0.2)

## 2018-12-24 LAB — COMPREHENSIVE METABOLIC PANEL
ALT: 129 U/L — ABNORMAL HIGH (ref 0–44)
AST: 61 U/L — ABNORMAL HIGH (ref 15–41)
Albumin: 3.4 g/dL — ABNORMAL LOW (ref 3.5–5.0)
Alkaline Phosphatase: 64 U/L (ref 38–126)
Anion gap: 13 (ref 5–15)
BUN: 32 mg/dL — ABNORMAL HIGH (ref 6–20)
CO2: 23 mmol/L (ref 22–32)
Calcium: 9.7 mg/dL (ref 8.9–10.3)
Chloride: 101 mmol/L (ref 98–111)
Creatinine, Ser: 0.84 mg/dL (ref 0.61–1.24)
GFR calc Af Amer: >60 mL/min (ref >60)
GFR calc non Af Amer: >60 mL/min (ref >60)
Glucose, Bld: 250 mg/dL — ABNORMAL HIGH (ref 70–99)
Potassium: 5.1 mmol/L (ref 3.5–5.1)
Sodium: 137 mmol/L (ref 135–145)
Total Bilirubin: 0.5 mg/dL (ref 0.3–1.2)
Total Protein: 6.9 g/dL (ref 6.5–8.1)

## 2018-12-24 LAB — PHOSPHORUS: Phosphorus: 4.1 mg/dL (ref 2.5–4.6)

## 2018-12-24 LAB — C-REACTIVE PROTEIN: CRP: 4.5 mg/dL — ABNORMAL HIGH (ref <1.0)

## 2018-12-24 LAB — CK: Total CK: 46 U/L — ABNORMAL LOW (ref 49–397)

## 2018-12-24 LAB — FERRITIN: Ferritin: 982 ng/mL — ABNORMAL HIGH (ref 24–336)

## 2018-12-24 LAB — D-DIMER, QUANTITATIVE: D-Dimer, Quant: 0.84 ug/mL-FEU — ABNORMAL HIGH (ref 0.00–0.50)

## 2018-12-24 LAB — MAGNESIUM: Magnesium: 2.3 mg/dL (ref 1.7–2.4)

## 2018-12-24 NOTE — Progress Notes (Signed)
Overnight: Nate RN solved the bradycardia / pauses mystery.  Patient gets asymptomatic bradycardia when he takes a deep breath in and holds it.  Apparently was able to reproduce bradycardia by having patient do this.  Suspect increased vagal tone.

## 2018-12-24 NOTE — Progress Notes (Signed)
PROGRESS NOTE    Harry Carroll  DZH:[id number] DOB: 1961-02-03 DOA: 12/20/2018 PCP: Biagio Borg, MD   Brief Narrative:  89 y.WM  PMHx depression, bipolar disorder unspecified , H LD, OSA, diverticulitis, Hx colonic polyps, insomnia, obesity  Presents to the ER because of shortness of breath.  Patient states he was diagnosed with COVID-19 about a week ago.  Patient symptoms started about 2 weeks ago with fever chills productive cough diarrhea and subsequently since symptoms persisted patient had COVID-19 test done which came positive.  Following which patient symptoms have greatly resolved but over the last 24 to 48 hours patient started becoming short of breath on minimal exertion.  Denies any swelling of the extremities chest pain.  ED Course: In the ER chest x-ray shows bilateral infiltrates concerning for pneumonia.  Patient was febrile with temperature around 100.4 F.  Labs show COVID-19 test was positive again.  WBC is 8 hemoglobin 15 creatinine 1.  Patient admitted for acute respiratory failure secondary to COVID pneumonia.    Subjective: 7/13 (7/13 currently on 2 L O2 via [place]: SPO2 93%), A/O x4, negative CP, positive S OB, negative abdominal pain.    Assessment & Plan:   Principal Problem:   Acute respiratory failure with hypoxia (HCC) Active Problems:   BIPOLAR DISORDER UNSPECIFIED   INSOMNIA   OSA (obstructive sleep apnea)   History of colonic polyps   Obesity   COVID-19 virus infection   Depression  Acute respiratory failure with hypoxia/COVID 19 pneumonia - Solu-Medrol 40 mg 3 times daily - Patient meets criteria for Remdesivir requiring 2 L O2 via [place] SPO2 89%, - Remdesivir per pharmacy protocol -Patient counseled that if his respiratory status deteriorates or certain inflammatory markers are elevated will start Actemra, patient in agreement with treatment plan. - COVID 19 inflammatory markers pending - Titrate O2 to maintain SPO2> 93% - Currently patient not  requiring pronating however if patient's respiratory status begins to decline prone patient aggressively 2 to 3 hours every 12 hours - Flutter valve Recent Labs  Lab 12/21/18 1040 12/22/18 0525 12/23/18 0357 12/24/18 0135  CRP 22.8* 17.1* 8.2* 4.5*  -7/13 currently on 2 L O2 via [place]: SPO2 93%   OSA -Required at night use high flow nasal cannula to maintain SPO2> 93%  Bradycardia/pauses  - Atropine to bedside - Patient at bedside - 7/13 determined overnight that patient's episode of bradycardia secondary to patient holding his breath i.e. vasovagal.  Patient has been counseled not to hold his breath.  Depression/bipolar unspecified -Patient not on medication for depression or bipolar disease.  Stable  HLD - Crestor 20 mg daily - 7/11 lipid panel within AHA/ADA guidelines  Obesity     DVT prophylaxis: Lovenox Code Status: Full Family Communication: None Disposition Plan: TBD   Consultants:    Procedures/Significant Events:  7/9 PCXR bilateral patchy opacification LEFT>> RIGHT.  Consistent with pneumonia most likely viral   I have personally reviewed and interpreted all radiology studies and my findings are as above.  VENTILATOR SETTINGS:    Cultures 6/30 Novel coronavirus positive    Antimicrobials: Anti-infectives (From admission, onward)   Start     Stop   12/22/18 1530  remdesivir 100 mg in sodium chloride 0.9 % 250 mL IVPB     12/26/18 1529   12/21/18 1530  remdesivir 200 mg in sodium chloride 0.9 % 250 mL IVPB         12/20/18 2230  cefTRIAXone (ROCEPHIN) 1 g in sodium chloride  0.9 % 100 mL IVPB     12/20/18 2325   12/20/18 2230  azithromycin (ZITHROMAX) 500 mg in sodium chloride 0.9 % 250 mL IVPB     12/21/18 0146       Devices    LINES / TUBES:      Continuous Infusions: . remdesivir 100 mg in NS 250 mL 100 mg (12/23/18 1612)     Objective: Vitals:   12/23/18 1400 12/23/18 2000 12/24/18 0507 12/24/18 0754  BP: 132/88 (!) 143/76     Pulse:  67    Resp:  20    Temp: 98.9 F (37.2 C) 98.3 F (36.8 C) 98.3 F (36.8 C) 97.9 F (36.6 C)  TempSrc: Oral Oral Oral Oral  SpO2:  93%      Intake/Output Summary (Last 24 hours) at 12/24/2018 2706 Last data filed at 12/24/2018 2376 Gross per 24 hour  Intake -  Output 575 ml  Net -575 ml   There were no vitals filed for this visit.  Physical Exam:  General: A/O x4, positive no acute respiratory distress currently on 2 L O2 via [place]; SPO2 93%  eyes: negative scleral hemorrhage, negative anisocoria, negative icterus ENT: Negative Runny nose, negative gingival bleeding, Neck:  Negative scars, masses, torticollis, lymphadenopathy, JVD Lungs: Clear to auscultation bilaterally without wheezes or crackles Cardiovascular: Regular rate and rhythm without murmur gallop or rub normal S1 and S2 Abdomen: negative abdominal pain, nondistended, positive soft, bowel sounds, no rebound, no ascites, no appreciable mass Extremities: No significant cyanosis, clubbing, or edema bilateral lower extremities Skin: Negative rashes, lesions, ulcers Psychiatric:  Negative depression, negative anxiety, negative fatigue, negative mania  Central nervous system:  Cranial nerves II through XII intact, tongue/uvula midline, all extremities muscle strength 5/5, sensation intact throughout, negative dysarthria, negative expressive aphasia, negative receptive aphasia.  .     Data Reviewed: Care during the described time interval was provided by me .  I have reviewed this patient's available data, including medical history, events of note, physical examination, and all test results as part of my evaluation.   CBC: Recent Labs  Lab 12/20/18 2122 12/21/18 1040 12/22/18 0525 12/23/18 0357 12/24/18 0135  WBC 8.0 5.4 14.0* 19.9* 17.5*  NEUTROABS 7.0 4.8 12.1* 17.3* 15.0*  HGB 15.2 14.9  15.0 14.4 13.7 13.2  HCT 43.9 43.4 42.1 41.0 40.6  MCV 90.3 90.4 90.5 91.7 93.3  PLT 233 267 327 401* 283    Basic Metabolic Panel: Recent Labs  Lab 12/20/18 2122 12/21/18 1040 12/22/18 0525 12/23/18 0357 12/24/18 0135  NA 133* 133* 135 136 137  K 3.8 4.2 4.2 4.3 5.1  CL 98 97* 99 100 101  CO2 22 20* 24 23 23   GLUCOSE 93 251* 154* 161* 250*  BUN 17 20 25* 34* 32*  CREATININE 1.02 0.84 0.82 0.88 0.84  CALCIUM 9.1 9.0 9.3 9.7 9.7  MG  --  2.2 2.4 2.3 2.3  PHOS  --   --  4.1 3.6 4.1   GFR: CrCl cannot be calculated (Unknown ideal weight.). Liver Function Tests: Recent Labs  Lab 12/21/18 1040 12/22/18 0525 12/23/18 0357 12/24/18 0135  AST 67* 61* 70* 61*  ALT 63* 72* 115* 129*  ALKPHOS 55 51 55 64  BILITOT 0.5 0.5 0.4 0.5  PROT 7.4 7.3 7.2 6.9  ALBUMIN 3.4* 3.4* 3.5 3.4*   No results for input(s): LIPASE, AMYLASE in the last 168 hours. No results for input(s): AMMONIA in the last 168 hours. Coagulation Profile: No  results for input(s): INR, PROTIME in the last 168 hours. Cardiac Enzymes: Recent Labs  Lab 12/22/18 0525 12/23/18 0357 12/24/18 0135  CKTOTAL 76 57 46*   BNP (last 3 results) No results for input(s): PROBNP in the last 8760 hours. HbA1C: No results for input(s): HGBA1C in the last 72 hours. CBG: No results for input(s): GLUCAP in the last 168 hours. Lipid Profile: Recent Labs    12/22/18 0525  CHOL 106  HDL 31*  LDLCALC 54  TRIG 104  CHOLHDL 3.4   Thyroid Function Tests: No results for input(s): TSH, T4TOTAL, FREET4, T3FREE, THYROIDAB in the last 72 hours. Anemia Panel: Recent Labs    12/23/18 0357 12/24/18 0135  FERRITIN 1,301* 982*   Urine analysis:    Component Value Date/Time   COLORURINE YELLOW 08/01/2018 0811   APPEARANCEUR CLEAR 08/01/2018 0811   LABSPEC 1.015 08/01/2018 0811   PHURINE 6.0 08/01/2018 0811   GLUCOSEU NEGATIVE 08/01/2018 0811   HGBUR NEGATIVE 08/01/2018 0811   BILIRUBINUR NEGATIVE 08/01/2018 0811   KETONESUR NEGATIVE 08/01/2018 0811   PROTEINUR NEGATIVE 05/07/2018 0912   UROBILINOGEN 0.2 08/01/2018 0811    NITRITE NEGATIVE 08/01/2018 0811   LEUKOCYTESUR NEGATIVE 08/01/2018 0811   Sepsis Labs: @LABRCNTIP (procalcitonin:4,lacticidven:4)  ) Recent Results (from the past 240 hour(s))  Culture, blood (Routine X 2) w Reflex to ID Panel     Status: None (Preliminary result)   Collection Time: 12/21/18 10:40 AM   Specimen: BLOOD  Result Value Ref Range Status   Specimen Description   Final    BLOOD RIGHT ARM Performed at [facility], [place] [address], [place], [place] [address]    Special Requests   Final    BOTTLES DRAWN AEROBIC ONLY Blood Culture adequate volume Performed at [facility] [address] [place], [place] [address]    Culture   Final    NO GROWTH 2 DAYS Performed at [facility] [address], [place], [place] [address]    Report Status PENDING  Incomplete  Culture, blood (Routine X 2) w Reflex to ID Panel     Status: None (Preliminary result)   Collection Time: 12/21/18 10:50 AM   Specimen: BLOOD  Result Value Ref Range Status   Specimen Description   Final    BLOOD RIGHT HAND Performed at [facility] [address] [place], [place] [address]    Special Requests   Final    BOTTLES DRAWN AEROBIC ONLY Blood Culture adequate volume Performed at [facility] [address] [place], [place] [address]    Culture   Final    NO GROWTH 2 DAYS Performed at [facility] [address], [place],  [address]    Report Status PENDING  Incomplete  Respiratory Panel by PCR     Status: None   Collection Time: 12/21/18  9:40 PM   Specimen: Nasopharyngeal Swab; Respiratory  Result Value Ref Range Status   Adenovirus NOT DETECTED NOT DETECTED Final   Coronavirus 229E NOT DETECTED NOT DETECTED Final    Comment: (NOTE) The Coronavirus on the Respiratory Panel, DOES NOT test for the novel  Coronavirus (2019 nCoV)    Coronavirus HKU1 NOT DETECTED NOT DETECTED Final   Coronavirus  NL63 NOT DETECTED NOT DETECTED Final   Coronavirus OC43 NOT DETECTED NOT DETECTED Final   Metapneumovirus NOT DETECTED NOT DETECTED Final   Rhinovirus / Enterovirus NOT DETECTED NOT DETECTED Final   Influenza A NOT DETECTED  NOT DETECTED Final   Influenza B NOT DETECTED NOT DETECTED Final   Parainfluenza Virus 1 NOT DETECTED NOT DETECTED Final   Parainfluenza Virus 2 NOT DETECTED NOT DETECTED Final   Parainfluenza Virus 3 NOT DETECTED NOT DETECTED Final   Parainfluenza Virus 4 NOT DETECTED NOT DETECTED Final   Respiratory Syncytial Virus NOT DETECTED NOT DETECTED Final   Bordetella pertussis NOT DETECTED NOT DETECTED Final   Chlamydophila pneumoniae NOT DETECTED NOT DETECTED Final   Mycoplasma pneumoniae NOT DETECTED NOT DETECTED Final    Comment: Performed at [facility], [place] [address] [place], [place] [address]         Radiology Studies: No results found.      Scheduled Meds: . atropine  0.5 mg Intravenous Once  . enoxaparin (LOVENOX) injection  40 mg Subcutaneous Q24H  . folic acid  1 mg Oral Daily  . Ipratropium-Albuterol  1 puff Inhalation Q6H  . mouth rinse  15 mL Mouth Rinse BID  . methylPREDNISolone (SOLU-MEDROL) injection  40 mg Intravenous TID  . rosuvastatin  20 mg Oral QHS  . thiamine  100 mg Oral Daily  . vitamin C  500 mg Oral Daily  . zinc sulfate  220 mg Oral Daily   Continuous Infusions: . remdesivir 100 mg in NS 250 mL 100 mg (12/23/18 1612)     LOS: 4 days   The patient is critically ill with multiple organ systems failure and requires high complexity decision making for assessment and support, frequent evaluation and titration of therapies, application of advanced monitoring technologies and extensive interpretation of multiple databases. Critical Care Time devoted to patient care services described in this note  Time spent: 40 minutes     WOODS, Geraldo Docker, MD [facility] Pager [phone]  If 7PM-7AM, please contact  night-coverage [email or web address] Password [facility] 12/24/2018, 8:23 AM

## 2018-12-25 LAB — CBC WITH DIFFERENTIAL/PLATELET
Abs Immature Granulocytes: 1.06 10*3/uL — ABNORMAL HIGH (ref 0.00–0.07)
Basophils Absolute: 0.1 10*3/uL (ref 0.0–0.1)
Basophils Relative: 1 %
Eosinophils Absolute: 0 10*3/uL (ref 0.0–0.5)
Eosinophils Relative: 0 %
HCT: 41 % (ref 39.0–52.0)
Hemoglobin: 13.6 g/dL (ref 13.0–17.0)
Immature Granulocytes: 6 %
Lymphocytes Relative: 6 %
Lymphs Abs: 1.1 10*3/uL (ref 0.7–4.0)
MCH: 31 pg (ref 26.0–34.0)
MCHC: 33.2 g/dL (ref 30.0–36.0)
MCV: 93.4 fL (ref 80.0–100.0)
Monocytes Absolute: 0.9 10*3/uL (ref 0.1–1.0)
Monocytes Relative: 5 %
Neutro Abs: 14.5 10*3/uL — ABNORMAL HIGH (ref 1.7–7.7)
Neutrophils Relative %: 82 %
Platelets: 400 10*3/uL (ref 150–400)
RBC: 4.39 MIL/uL (ref 4.22–5.81)
RDW: 13.1 % (ref 11.5–15.5)
WBC: 17.7 10*3/uL — ABNORMAL HIGH (ref 4.0–10.5)
nRBC: 0.1 % (ref 0.0–0.2)

## 2018-12-25 LAB — MAGNESIUM: Magnesium: 2.3 mg/dL (ref 1.7–2.4)

## 2018-12-25 LAB — COMPREHENSIVE METABOLIC PANEL
ALT: 133 U/L — ABNORMAL HIGH (ref 0–44)
AST: 55 U/L — ABNORMAL HIGH (ref 15–41)
Albumin: 3.4 g/dL — ABNORMAL LOW (ref 3.5–5.0)
Alkaline Phosphatase: 54 U/L (ref 38–126)
Anion gap: 13 (ref 5–15)
BUN: 29 mg/dL — ABNORMAL HIGH (ref 6–20)
CO2: 21 mmol/L — ABNORMAL LOW (ref 22–32)
Calcium: 9.2 mg/dL (ref 8.9–10.3)
Chloride: 100 mmol/L (ref 98–111)
Creatinine, Ser: 0.82 mg/dL (ref 0.61–1.24)
GFR calc Af Amer: >60 mL/min (ref >60)
GFR calc non Af Amer: >60 mL/min (ref >60)
Glucose, Bld: 212 mg/dL — ABNORMAL HIGH (ref 70–99)
Potassium: 4.6 mmol/L (ref 3.5–5.1)
Sodium: 134 mmol/L — ABNORMAL LOW (ref 135–145)
Total Bilirubin: 0.4 mg/dL (ref 0.3–1.2)
Total Protein: 6.6 g/dL (ref 6.5–8.1)

## 2018-12-25 LAB — GLUCOSE, CAPILLARY
Glucose-Capillary: 251 mg/dL — ABNORMAL HIGH (ref 70–99)
Glucose-Capillary: 246 mg/dL — ABNORMAL HIGH (ref 70–99)

## 2018-12-25 LAB — D-DIMER, QUANTITATIVE: D-Dimer, Quant: 1.29 ug/mL-FEU — ABNORMAL HIGH (ref 0.00–0.50)

## 2018-12-25 LAB — HEMOGLOBIN A1C
Hgb A1c MFr Bld: 6.3 % — ABNORMAL HIGH (ref 4.8–5.6)
Mean Plasma Glucose: 134.11 mg/dL

## 2018-12-25 LAB — PHOSPHORUS: Phosphorus: 4 mg/dL (ref 2.5–4.6)

## 2018-12-25 LAB — CK: Total CK: 48 U/L — ABNORMAL LOW (ref 49–397)

## 2018-12-25 LAB — C-REACTIVE PROTEIN: CRP: 2.5 mg/dL — ABNORMAL HIGH (ref <1.0)

## 2018-12-25 LAB — FERRITIN: Ferritin: 849 ng/mL — ABNORMAL HIGH (ref 24–336)

## 2018-12-25 MED ORDER — INSULIN ASPART 100 UNIT/ML ~~LOC~~ SOLN
0.0000 [IU] | SUBCUTANEOUS | Status: DC
  Administered 2018-12-25: 18:00:00 5 [IU] via SUBCUTANEOUS
  Administered 2018-12-25: 20:00:00 3 [IU] via SUBCUTANEOUS
  Administered 2018-12-26: 04:00:00 2 [IU] via SUBCUTANEOUS
  Administered 2018-12-26: 5 [IU] via SUBCUTANEOUS

## 2018-12-25 MED ORDER — ENOXAPARIN SODIUM 60 MG/0.6ML ~~LOC~~ SOLN
60.0000 mg | SUBCUTANEOUS | Status: DC
  Administered 2018-12-26: 60 mg via SUBCUTANEOUS
  Filled 2018-12-25: qty 0.6

## 2018-12-25 MED ORDER — ENOXAPARIN SODIUM 30 MG/0.3ML ~~LOC~~ SOLN
30.0000 mg | Freq: Once | SUBCUTANEOUS | Status: AC
  Administered 2018-12-25: 10:00:00 30 mg via SUBCUTANEOUS
  Filled 2018-12-25: qty 0.3

## 2018-12-25 MED ORDER — METHYLPREDNISOLONE SODIUM SUCC 40 MG IJ SOLR: 40.0000 mg | Freq: Every day | INTRAMUSCULAR | Status: DC

## 2018-12-25 NOTE — Progress Notes (Signed)
SATURATION QUALIFICATIONS: (This note is used to comply with regulatory documentation for home oxygen)  Patient Saturations on Room Air at Rest = 93%  Patient Saturations on Room Air while Ambulating =89%  With rest, patient's saturations on room air improved to 90% during ambulation and returned to 92% once back in room after two large laps around nurses station.  Patient denied shortness of breath during walk and tolerated well.

## 2018-12-25 NOTE — Progress Notes (Signed)
Ok to increase lovenox to 0.5mg /kg/day. He got a dose of 40mg  this AM so we will give a one time dose of Lovenox 30mg  today.  Onnie Boer, PharmD, BCIDP, AAHIVP, CPP Infectious Disease Pharmacist 12/25/2018 9:12 AM

## 2018-12-25 NOTE — Progress Notes (Addendum)
PROGRESS NOTE    Harry Carroll  IFO:[id number] DOB: August 28, 1960 DOA: 12/20/2018 PCP: Biagio Borg, MD   Brief Narrative:  78 y.WM  PMHx depression, bipolar disorder unspecified , H LD, OSA, diverticulitis, Hx colonic polyps, insomnia, obesity  Presents to the ER because of shortness of breath.  Patient states he was diagnosed with COVID-19 about a week ago.  Patient symptoms started about 2 weeks ago with fever chills productive cough diarrhea and subsequently since symptoms persisted patient had COVID-19 test done which came positive.  Following which patient symptoms have greatly resolved but over the last 24 to 48 hours patient started becoming short of breath on minimal exertion.  Denies any swelling of the extremities chest pain.  ED Course: In the ER chest x-ray shows bilateral infiltrates concerning for pneumonia.  Patient was febrile with temperature around 100.4 F.  Labs show COVID-19 test was positive again.  WBC is 8 hemoglobin 15 creatinine 1.  Patient admitted for acute respiratory failure secondary to COVID pneumonia.    Subjective: 7/14 (currently on room air; SPO2 94%), A/O x4, negative CP, positive S OB, negative abdominal pain     Assessment & Plan:   Principal Problem:   Acute respiratory failure with hypoxia (HCC) Active Problems:   BIPOLAR DISORDER UNSPECIFIED   INSOMNIA   OSA (obstructive sleep apnea)   History of colonic polyps   Obesity   COVID-19 virus infection   Depression  Acute respiratory failure with hypoxia/COVID 19 pneumonia - 7/14 decrease Solu-Medrol 40 mg daily - Patient meets criteria for Remdesivir requiring 2 L O2 via [place] SPO2 89%, - Remdesivir per pharmacy protocol -Patient counseled that if his respiratory status deteriorates or certain inflammatory markers are elevated will start Actemra, patient in agreement with treatment plan. - COVID 19 inflammatory markers pending - Titrate O2 to maintain SPO2> 93% - Currently patient not  requiring pronating however if patient's respiratory status begins to decline prone patient aggressively 2 to 3 hours every 12 hours - Flutter valve Recent Labs  Lab 12/21/18 1040 12/22/18 0525 12/23/18 0357 12/24/18 0135 12/25/18 0155  CRP 22.8* 17.1* 8.2* 4.5* 2.5*  -7/14 (currently on room air; SPO2 94%) if remained stable overnight will discharge on 7/15 -7/15 obtain ambulatory SPO2; record findings in appropriate epic note for insurance purposes SATURATION QUALIFICATIONS: (This note is used to comply with regulatory documentation for home oxygen) Patient Saturations on Room Air at Rest = 93% Patient Saturations on Room Air while Ambulating =89% With rest, patient's saturations on room air improved to 90% during ambulation and returned to 92% once back in room after two large laps around nurses station.  Patient denied shortness of breath during walk and tolerated well - Does not meet criteria for home O2  OSA -Required at night use high flow nasal cannula to maintain SPO2> 93%  Bradycardia/pauses  - 7/13 determined overnight that patient's episode of bradycardia secondary to patient holding his breath i.e. vasovagal.  Patient has been counseled not to hold his breath.  Depression/bipolar unspecified -Patient not on medication for depression or bipolar disease.  Stable  HLD - Crestor 20 mg daily - 7/11 lipid panel within AHA/ADA guidelines  Obesity     DVT prophylaxis: Lovenox Code Status: Full Family Communication: None Disposition Plan: TBD   Consultants:    Procedures/Significant Events:  7/9 PCXR bilateral patchy opacification LEFT>> RIGHT.  Consistent with pneumonia most likely viral   I have personally reviewed and interpreted all radiology studies and my findings  are as above.  VENTILATOR SETTINGS:    Cultures 6/30 Novel coronavirus positive    Antimicrobials: Anti-infectives (From admission, onward)   Start     Stop   12/22/18 1530   remdesivir 100 mg in sodium chloride 0.9 % 250 mL IVPB     12/26/18 1529   12/21/18 1530  remdesivir 200 mg in sodium chloride 0.9 % 250 mL IVPB         12/20/18 2230  cefTRIAXone (ROCEPHIN) 1 g in sodium chloride 0.9 % 100 mL IVPB     12/20/18 2325   12/20/18 2230  azithromycin (ZITHROMAX) 500 mg in sodium chloride 0.9 % 250 mL IVPB     12/21/18 0146       Devices    LINES / TUBES:      Continuous Infusions: . remdesivir 100 mg in NS 250 mL 100 mg (12/24/18 1639)     Objective: Vitals:   12/24/18 1724 12/24/18 2043 12/25/18 0611 12/25/18 0817  BP: 121/83 120/81 121/78 126/87  Pulse: 61     Resp: 19     Temp: 98.1 F (36.7 C) 98.1 F (36.7 C) 98 F (36.7 C) 98.3 F (36.8 C)  TempSrc: Oral Oral Oral Oral  SpO2: 94%     Weight:   115 kg   Height:        Intake/Output Summary (Last 24 hours) at 12/25/2018 8366 Last data filed at 12/25/2018 0700 Gross per 24 hour  Intake 360 ml  Output 2175 ml  Net -1815 ml   Filed Weights   12/24/18 0900 12/24/18 0941 12/25/18 0611  Weight: 113.4 kg 115 kg 115 kg   Physical Exam:  General: A/O x4, negative acute respiratory distress ((currently on room air; SPO2 94%) Eyes: negative scleral hemorrhage, negative anisocoria, negative icterus ENT: Negative Runny nose, negative gingival bleeding, Neck:  Negative scars, masses, torticollis, lymphadenopathy, JVD Lungs: Clear to auscultation bilaterally without wheezes or crackles Cardiovascular: Regular rate and rhythm without murmur gallop or rub normal S1 and S2 Abdomen: negative abdominal pain, nondistended, positive soft, bowel sounds, no rebound, no ascites, no appreciable mass Extremities: No significant cyanosis, clubbing, or edema bilateral lower extremities Skin: Negative rashes, lesions, ulcers Psychiatric:  Negative depression, negative anxiety, negative fatigue, negative mania  Central nervous system:  Cranial nerves II through XII intact, tongue/uvula midline, all  extremities muscle strength 5/5, sensation intact throughout,  negative dysarthria, negative expressive aphasia, negative receptive aphasia.      Data Reviewed: Care during the described time interval was provided by me .  I have reviewed this patient's available data, including medical history, events of note, physical examination, and all test results as part of my evaluation.   CBC: Recent Labs  Lab 12/21/18 1040 12/22/18 0525 12/23/18 0357 12/24/18 0135 12/25/18 0155  WBC 5.4 14.0* 19.9* 17.5* 17.7*  NEUTROABS 4.8 12.1* 17.3* 15.0* 14.5*  HGB 14.9  15.0 14.4 13.7 13.2 13.6  HCT 43.4 42.1 41.0 40.6 41.0  MCV 90.4 90.5 91.7 93.3 93.4  PLT 267 327 401* 392 294   Basic Metabolic Panel: Recent Labs  Lab 12/21/18 1040 12/22/18 0525 12/23/18 0357 12/24/18 0135 12/25/18 0155  NA 133* 135 136 137 134*  K 4.2 4.2 4.3 5.1 4.6  CL 97* 99 100 101 100  CO2 20* 24 23 23  21*  GLUCOSE 251* 154* 161* 250* 212*  BUN 20 25* 34* 32* 29*  CREATININE 0.84 0.82 0.88 0.84 0.82  CALCIUM 9.0 9.3 9.7 9.7 9.2  MG  2.2 2.4 2.3 2.3 2.3  PHOS  --  4.1 3.6 4.1 4.0   GFR: Estimated Creatinine Clearance: 121.3 mL/min (by C-G formula based on SCr of 0.82 mg/dL). Liver Function Tests: Recent Labs  Lab 12/21/18 1040 12/22/18 0525 12/23/18 0357 12/24/18 0135 12/25/18 0155  AST 67* 61* 70* 61* 55*  ALT 63* 72* 115* 129* 133*  ALKPHOS 55 51 55 64 54  BILITOT 0.5 0.5 0.4 0.5 0.4  PROT 7.4 7.3 7.2 6.9 6.6  ALBUMIN 3.4* 3.4* 3.5 3.4* 3.4*   No results for input(s): LIPASE, AMYLASE in the last 168 hours. No results for input(s): AMMONIA in the last 168 hours. Coagulation Profile: No results for input(s): INR, PROTIME in the last 168 hours. Cardiac Enzymes: Recent Labs  Lab 12/22/18 0525 12/23/18 0357 12/24/18 0135 12/25/18 0155  CKTOTAL 76 57 46* 48*   BNP (last 3 results) No results for input(s): PROBNP in the last 8760 hours. HbA1C: No results for input(s): HGBA1C in the last 72  hours. CBG: No results for input(s): GLUCAP in the last 168 hours. Lipid Profile: No results for input(s): CHOL, HDL, LDLCALC, TRIG, CHOLHDL, LDLDIRECT in the last 72 hours. Thyroid Function Tests: No results for input(s): TSH, T4TOTAL, FREET4, T3FREE, THYROIDAB in the last 72 hours. Anemia Panel: Recent Labs    12/24/18 0135 12/25/18 0155  FERRITIN 982* 849*   Urine analysis:    Component Value Date/Time   COLORURINE YELLOW 08/01/2018 0811   APPEARANCEUR CLEAR 08/01/2018 0811   LABSPEC 1.015 08/01/2018 0811   PHURINE 6.0 08/01/2018 0811   GLUCOSEU NEGATIVE 08/01/2018 0811   HGBUR NEGATIVE 08/01/2018 0811   BILIRUBINUR NEGATIVE 08/01/2018 0811   KETONESUR NEGATIVE 08/01/2018 0811   PROTEINUR NEGATIVE 05/07/2018 0912   UROBILINOGEN 0.2 08/01/2018 0811   NITRITE NEGATIVE 08/01/2018 0811   LEUKOCYTESUR NEGATIVE 08/01/2018 0811   Sepsis Labs: @LABRCNTIP (procalcitonin:4,lacticidven:4)  ) Recent Results (from the past 240 hour(s))  Culture, blood (Routine X 2) w Reflex to ID Panel     Status: None (Preliminary result)   Collection Time: 12/21/18 10:40 AM   Specimen: BLOOD  Result Value Ref Range Status   Specimen Description   Final    BLOOD RIGHT ARM Performed at [facility], [place] [address], [place], [place] [address]    Special Requests   Final    BOTTLES DRAWN AEROBIC ONLY Blood Culture adequate volume Performed at [facility] [address], [place], [place] [address]    Culture   Final    NO GROWTH 3 DAYS Performed at [facility], [place] [address] [place], [place] [address]    Report Status PENDING  Incomplete  Culture, blood (Routine X 2) w Reflex to ID Panel     Status: None (Preliminary result)   Collection Time: 12/21/18 10:50 AM   Specimen: BLOOD  Result Value Ref Range Status   Specimen Description   Final    BLOOD RIGHT HAND Performed at [facility] [address], [place], [place]  [address]    Special Requests   Final    BOTTLES DRAWN AEROBIC ONLY Blood Culture adequate volume Performed at [facility] [address] [place], [place] [address]    Culture   Final    NO GROWTH 3 DAYS Performed at [facility], [place] [address] [place],  [address]    Report Status PENDING  Incomplete  Respiratory Panel by PCR     Status: None  Collection Time: 12/21/18  9:40 PM   Specimen: Nasopharyngeal Swab; Respiratory  Result Value Ref Range Status   Adenovirus NOT DETECTED NOT DETECTED Final   Coronavirus 229E NOT DETECTED NOT DETECTED Final    Comment: (NOTE) The Coronavirus on the Respiratory Panel, DOES NOT test for the novel  Coronavirus (2019 nCoV)    Coronavirus HKU1 NOT DETECTED NOT DETECTED Final   Coronavirus NL63 NOT DETECTED NOT DETECTED Final   Coronavirus OC43 NOT DETECTED NOT DETECTED Final   Metapneumovirus NOT DETECTED NOT DETECTED Final   Rhinovirus / Enterovirus NOT DETECTED NOT DETECTED Final   Influenza A NOT DETECTED NOT DETECTED Final   Influenza B NOT DETECTED NOT DETECTED Final   Parainfluenza Virus 1 NOT DETECTED NOT DETECTED Final   Parainfluenza Virus 2 NOT DETECTED NOT DETECTED Final   Parainfluenza Virus 3 NOT DETECTED NOT DETECTED Final   Parainfluenza Virus 4 NOT DETECTED NOT DETECTED Final   Respiratory Syncytial Virus NOT DETECTED NOT DETECTED Final   Bordetella pertussis NOT DETECTED NOT DETECTED Final   Chlamydophila pneumoniae NOT DETECTED NOT DETECTED Final   Mycoplasma pneumoniae NOT DETECTED NOT DETECTED Final    Comment: Performed at [facility], [place]. [address], [place], [place] [address]         Radiology Studies: No results found.      Scheduled Meds: . atropine  0.5 mg Intravenous Once  . enoxaparin (LOVENOX) injection  40 mg Subcutaneous Q24H  . folic acid  1 mg Oral Daily  . Ipratropium-Albuterol  1 puff Inhalation Q6H  . mouth rinse  15 mL Mouth Rinse BID  .  methylPREDNISolone (SOLU-MEDROL) injection  40 mg Intravenous TID  . rosuvastatin  20 mg Oral QHS  . thiamine  100 mg Oral Daily  . vitamin C  500 mg Oral Daily  . zinc sulfate  220 mg Oral Daily   Continuous Infusions: . remdesivir 100 mg in NS 250 mL 100 mg (12/24/18 1639)     LOS: 5 days   The patient is critically ill with multiple organ systems failure and requires high complexity decision making for assessment and support, frequent evaluation and titration of therapies, application of advanced monitoring technologies and extensive interpretation of multiple databases. Critical Care Time devoted to patient care services described in this note  Time spent: 40 minutes     [place] Svehla, Geraldo Docker, MD [facility] Pager [phone]  If 7PM-7AM, please contact night-coverage [email or web address] Password [facility] 12/25/2018, 8:42 AM

## 2018-12-25 NOTE — Progress Notes (Signed)
Inpatient Diabetes Program Recommendations  AACE/ADA: New Consensus Statement on Inpatient Glycemic Control (2015)  Target Ranges:  Prepandial:   less than 140 mg/dL      Peak postprandial:   less than 180 mg/dL (1-2 hours)      Critically ill patients:  140 - 180 mg/dL   Lab Results  Component Value Date   GLUCAP 95 10/23/2015   HGBA1C 5.8 06/22/2017    Review of Glycemic Control Results for Harry Carroll, Harry Carroll (MRN [id number]) as of 12/25/2018 14:35  Ref. Range 12/24/2018 01:35 12/25/2018 01:55  Glucose Latest Ref Range: 70 - 99 mg/dL 250 (H) 212 (H)   Inpatient Diabetes Program Recommendations:    While in steroids, please consider Glycemic control order set with sensitive correction tid + hs 0-5 units.  Thank you, Nani Gasser. Sharis Keeran, RN, MSN, CDE  Diabetes Coordinator Inpatient Glycemic Control Team Team Pager [phone] (8am-5pm) 12/25/2018 2:37 PM

## 2018-12-26 DIAGNOSIS — J9601 Acute respiratory failure with hypoxia: Secondary | ICD-10-CM

## 2018-12-26 LAB — CBC WITH DIFFERENTIAL/PLATELET
Abs Immature Granulocytes: 1.2 10*3/uL — ABNORMAL HIGH (ref 0.00–0.07)
Basophils Absolute: 0 10*3/uL (ref 0.0–0.1)
Basophils Relative: 0 %
Eosinophils Absolute: 0 10*3/uL (ref 0.0–0.5)
Eosinophils Relative: 0 %
HCT: 41.2 % (ref 39.0–52.0)
Hemoglobin: 13.9 g/dL (ref 13.0–17.0)
Lymphocytes Relative: 10 %
Lymphs Abs: 1.8 10*3/uL (ref 0.7–4.0)
MCH: 31.3 pg (ref 26.0–34.0)
MCHC: 33.7 g/dL (ref 30.0–36.0)
MCV: 92.8 fL (ref 80.0–100.0)
Metamyelocytes Relative: 1 %
Monocytes Absolute: 0.9 10*3/uL (ref 0.1–1.0)
Monocytes Relative: 5 %
Myelocytes: 6 %
Neutro Abs: 13.8 10*3/uL — ABNORMAL HIGH (ref 1.7–7.7)
Neutrophils Relative %: 78 %
Platelets: 390 10*3/uL (ref 150–400)
RBC: 4.44 MIL/uL (ref 4.22–5.81)
RDW: 13.1 % (ref 11.5–15.5)
WBC: 17.7 10*3/uL — ABNORMAL HIGH (ref 4.0–10.5)
nRBC: 0.2 % (ref 0.0–0.2)

## 2018-12-26 LAB — CULTURE, BLOOD (ROUTINE X 2)
Culture: NO GROWTH
Culture: NO GROWTH
Special Requests: ADEQUATE
Special Requests: ADEQUATE

## 2018-12-26 LAB — D-DIMER, QUANTITATIVE: D-Dimer, Quant: 1.5 ug/mL-FEU — ABNORMAL HIGH (ref 0.00–0.50)

## 2018-12-26 LAB — COMPREHENSIVE METABOLIC PANEL
ALT: 119 U/L — ABNORMAL HIGH (ref 0–44)
AST: 45 U/L — ABNORMAL HIGH (ref 15–41)
Albumin: 3.1 g/dL — ABNORMAL LOW (ref 3.5–5.0)
Alkaline Phosphatase: 56 U/L (ref 38–126)
Anion gap: 12 (ref 5–15)
BUN: 28 mg/dL — ABNORMAL HIGH (ref 6–20)
CO2: 22 mmol/L (ref 22–32)
Calcium: 9.3 mg/dL (ref 8.9–10.3)
Chloride: 102 mmol/L (ref 98–111)
Creatinine, Ser: 0.88 mg/dL (ref 0.61–1.24)
GFR calc Af Amer: >60 mL/min (ref >60)
GFR calc non Af Amer: >60 mL/min (ref >60)
Glucose, Bld: 180 mg/dL — ABNORMAL HIGH (ref 70–99)
Potassium: 4.4 mmol/L (ref 3.5–5.1)
Sodium: 136 mmol/L (ref 135–145)
Total Bilirubin: 0.3 mg/dL (ref 0.3–1.2)
Total Protein: 6.3 g/dL — ABNORMAL LOW (ref 6.5–8.1)

## 2018-12-26 LAB — MAGNESIUM: Magnesium: 2.3 mg/dL (ref 1.7–2.4)

## 2018-12-26 LAB — PHOSPHORUS: Phosphorus: 3.8 mg/dL (ref 2.5–4.6)

## 2018-12-26 LAB — GLUCOSE, CAPILLARY
Glucose-Capillary: 110 mg/dL — ABNORMAL HIGH (ref 70–99)
Glucose-Capillary: 115 mg/dL — ABNORMAL HIGH (ref 70–99)
Glucose-Capillary: 178 mg/dL — ABNORMAL HIGH (ref 70–99)
Glucose-Capillary: 254 mg/dL — ABNORMAL HIGH (ref 70–99)

## 2018-12-26 LAB — FERRITIN: Ferritin: 666 ng/mL — ABNORMAL HIGH (ref 24–336)

## 2018-12-26 LAB — C-REACTIVE PROTEIN: CRP: 1.8 mg/dL — ABNORMAL HIGH (ref <1.0)

## 2018-12-26 LAB — CK: Total CK: 49 U/L (ref 49–397)

## 2018-12-26 MED ORDER — PREDNISONE 10 MG PO TABS: ORAL_TABLET | ORAL | 0 refills | Status: DC

## 2018-12-26 MED ORDER — ZINC SULFATE 220 (50 ZN) MG PO CAPS: 220.0000 mg | ORAL_CAPSULE | Freq: Every day | ORAL | 0 refills | Status: DC

## 2018-12-26 MED ORDER — PREDNISONE 20 MG PO TABS
40.0000 mg | ORAL_TABLET | Freq: Every day | ORAL | Status: DC
  Administered 2018-12-26: 09:00:00 40 mg via ORAL
  Filled 2018-12-26: qty 2

## 2018-12-26 MED ORDER — ASCORBIC ACID 500 MG PO TABS: 500.0000 mg | ORAL_TABLET | Freq: Every day | ORAL | 0 refills | Status: DC

## 2018-12-26 MED ORDER — ALBUTEROL SULFATE HFA 108 (90 BASE) MCG/ACT IN AERS: 2.0000 | INHALATION_SPRAY | Freq: Four times a day (QID) | RESPIRATORY_TRACT | 0 refills | Status: DC | PRN

## 2018-12-26 NOTE — Discharge Summary (Signed)
[facility] Discharge Summary   Patient: Harry Carroll ZHG:[id number]   PCP: Biagio Borg, MD DOB: 1961/03/01   Date of admission: 12/20/2018   Date of discharge:  12/26/2018    Discharge Diagnoses:  Principal Problem:   Acute respiratory failure with hypoxia ([place]) secondary to COVID-19 infection Active Problems:   BIPOLAR DISORDER UNSPECIFIED   INSOMNIA   OSA (obstructive sleep apnea)   History of colonic polyps   Obesity   COVID-19 virus infection   Depression   Admitted From: Home Disposition:  Home   Recommendations for Outpatient Follow-up:  1. Please follow-up with PCP in 1 week  Follow-up Information    Biagio Borg, MD. Schedule an appointment as soon as possible for a visit in 1 week(s).   Specialties: Internal Medicine, Radiology Contact information: [address] [place] [address] [phone]          Diet recommendation: Cardiac diet carb modified  Activity: The patient is advised to gradually reintroduce usual activities,as tolerated .  Discharge Condition: good  Code Status: Full code  History of present illness: As per the H and P dictated on admission, " Harry Carroll is a 58 y.o. male with history of hyperlipidemia presents to the ER because of shortness of breath.  Patient states he was diagnosed with COVID-19 about a week ago.  Patient symptoms started about 2 weeks ago with fever chills productive cough diarrhea and subsequently since symptoms persisted patient had COVID-19 test done which came positive.  Following which patient symptoms have greatly resolved but over the last 24 to 48 hours patient started becoming short of breath on minimal exertion.  Denies any swelling of the extremities chest pain.  ED Course: In the ER chest x-ray shows bilateral infiltrates concerning for pneumonia.  Patient was febrile with temperature around 100.4 F.  Labs show COVID-19 test was positive again.  WBC is 8 hemoglobin 15 creatinine 1.   Patient admitted for acute respiratory failure secondary to COVID pneumonia."  Hospital Course:  Summary of his active problems in the hospital is as following.  Acute respiratory failure with hypoxia  COVID 19 pneumonia  Lab Results  Component Value Date   SARSCOV2NAA Detected (A) 12/11/2018   CXR: hazy bilateral peripheral opacities  Recent Labs    12/24/18 0135 12/25/18 0155 12/26/18 0225  DDIMER 0.84* 1.29* 1.50*  FERRITIN 982* 849* 666*  CRP 4.5* 2.5* 1.8*   Tmax: Afebrile Oxygen requirements: On room air both at rest as well as on exertion  Antibiotics: 1 dose of IV ceftriaxone and azithromycin.  No further antibiotic. Diuretics: none Vitamin C and Zinc: Continue on discharge DVT Prophylaxis: Subcutaneous Lovenox none on discharge Remdesivir: Met criteria on admission.  Completed 5-day treatment course. Steroids: Treated with IV Solu-Medrol 40 3 times daily for 5 days.  Transitioning to oral prednisone taper. Actemra: Not used.  During this encounter: Patient Isolation: Airborne + Droplet + Contact HCP PPE: CAPR, gown. gloves Patient PPE: None  Disposition: Significant improvement in patient's inflammatory markers as well as oxygen requirement currently it is felt that the patient can safely be discharged home with oral steroids and vitamins and zinc.  Recommend outpatient follow-up with PCP. Patient's family is already tested positive for COVID test therefore does not require any specific isolation at home.  Isolation guidelines provided for the patient.  The treatment plan and use of medications and known side effects were discussed with patient/family. It was clearly explained that there  is no proven definitive treatment for COVID-19 infection yet. Any medications used here are based on case reports/anecdotal data which are not peer-reviewed and has not been studied using randomized control trials.  Complete risks and long-term side effects are unknown, however in  the best clinical judgment they seem to be of some clinical benefit rather than medical risks.  Patient/family agree with the treatment plan and want to receive these treatments as indicated.   OSA Required at night Pt was given high flow nasal cannula  Bradycardia/pauses  Overnight patient was seen to have episode of bradycardia During the day patient remains asymptomatic. Appropriate augmentation of heart rate and activity.  No further work-up for now.  Depression/bipolar unspecified Patient not on medication for depression or bipolar disease.  Stable  HLD Crestor 20 mg daily  Obesity Body mass index is 37.44 kg/m.   Bilateral leg edema. Patient reports edema significantly improving. Likely will remain while the patient is recovering from poor weight as well as on steroids.  No calf tenderness.  Recommend continue ambulation at home.  Patient was ambulatory without any assistance. On the day of the discharge the patient's vitals were stable, and no other acute medical condition were reported by patient. the patient was felt safe to be discharge at Home with no therapy needed on discharge.  Consultants: none Procedures: none  DISCHARGE MEDICATION: Allergies as of 12/26/2018      Reactions   Ciprofloxacin Shortness Of Breath, Nausea And Vomiting, Nausea Only, Rash, Other (See Comments)   Marked mucous production   Dilaudid [hydromorphone Hcl] Shortness Of Breath, Nausea Only, Other (See Comments)   Patient developed rash, excessive mucous production and chest pain - tolerates morphine   Hydrocodone Itching      Medication List    STOP taking these medications   Diclofenac Sodium 2 % Soln Commonly known as: Pennsaid     TAKE these medications   acetaminophen 500 MG tablet Commonly known as: TYLENOL Take 1,000 mg by mouth every 6 (six) hours as needed for mild pain or fever.   albuterol 108 (90 Base) MCG/ACT inhaler Commonly known as: VENTOLIN HFA Inhale 2  puffs into the lungs every 6 (six) hours as needed for wheezing or shortness of breath.   ascorbic acid 500 MG tablet Commonly known as: VITAMIN C Take 1 tablet (500 mg total) by mouth daily. Start taking on: December 27, 2018   predniSONE 10 MG tablet Commonly known as: DELTASONE Take 49m daily for 3days,Take 328mdaily for 3days,Take 2059maily for 3days,Take 48m12mily for 3days, then stop   rosuvastatin 20 MG tablet Commonly known as: CRESTOR TAKE 1 TABLET BY MOUTH EVERY DAY What changed: when to take this   zinc sulfate 220 (50 Zn) MG capsule Take 1 capsule (220 mg total) by mouth daily. Start taking on: December 27, 2018      Allergies  Allergen Reactions  . Ciprofloxacin Shortness Of Breath, Nausea And Vomiting, Nausea Only, Rash and Other (See Comments)    Marked mucous production  . Dilaudid [Hydromorphone Hcl] Shortness Of Breath, Nausea Only and Other (See Comments)    Patient developed rash, excessive mucous production and chest pain - tolerates morphine  . Hydrocodone Itching   Discharge Instructions    Diet - low sodium heart healthy   Complete by: As directed    Increase activity slowly   Complete by: As directed      Discharge Exam: Filed Weights   12/24/18 0900 12/24/18 0941 12/25/18  [id number]  Weight: 113.4 kg 115 kg 115 kg   Vitals:   12/26/18 0600 12/26/18 0743  BP: 126/72 107/79  Pulse:  65  Resp:  18  Temp: 98.1 F (36.7 C) 98.1 F (36.7 C)  SpO2:  93%   General: Appear in no distress, no Rash; Oral Mucosa Clear, moist. no Abnormal Mass Or lumps Cardiovascular: S1 and S2 Present, no Murmur, Respiratory: normal respiratory effort, Bilateral Air entry present and Clear to Auscultation, no Crackles, no wheezes Abdomen: Bowel Sound present, Soft and no tenderness, no hernia Extremities: bilateral Pedal edema, no calf tenderness Neurology: alert and oriented to time, place, and person affect appropriate. normal without focal findings, mental status,  speech normal, alert and oriented x3, PERLA, Motor strength 5/5 and symmetric and sensation grossly normal to light touch   The results of significant diagnostics from this hospitalization (including imaging, microbiology, ancillary and laboratory) are listed below for reference.    Significant Diagnostic Studies: Dg Chest Portable 1 View  Result Date: 12/20/2018 CLINICAL DATA:  58 year old who tested positive for COVID-19 approximately 2 weeks ago, presenting with fever, cough and diarrhea which have shown some improvement, though shortness of breath has worsened. Hypoxemia. EXAM: PORTABLE CHEST 1 VIEW COMPARISON:  04/17/2017 and earlier. FINDINGS: Cardiac silhouette normal in size for AP portable technique. Patchy ground-glass airspace opacities in both lungs, LEFT greater than RIGHT. Pulmonary vascularity normal. No visible pleural effusions. IMPRESSION: Bilateral pneumonia, LEFT greater than RIGHT, likely viral. Electronically Signed   By: Evangeline Dakin M.D.   On: 12/20/2018 21:26    Microbiology: Recent Results (from the past 240 hour(s))  Culture, blood (Routine X 2) w Reflex to ID Panel     Status: None (Preliminary result)   Collection Time: 12/21/18 10:40 AM   Specimen: BLOOD  Result Value Ref Range Status   Specimen Description   Final    BLOOD RIGHT ARM Performed at [facility] [address] [place], [place] [address]    Special Requests   Final    BOTTLES DRAWN AEROBIC ONLY Blood Culture adequate volume Performed at [facility] [address], [place], [place] [address]    Culture   Final    NO GROWTH 4 DAYS Performed at [facility], [place] [address] [place], [place] [address]    Report Status PENDING  Incomplete  Culture, blood (Routine X 2) w Reflex to ID Panel     Status: None (Preliminary result)   Collection Time: 12/21/18 10:50 AM   Specimen: BLOOD  Result Value Ref Range Status   Specimen Description   Final     BLOOD RIGHT HAND Performed at [facility] [address], [place], [place] [address]    Special Requests   Final    BOTTLES DRAWN AEROBIC ONLY Blood Culture adequate volume Performed at [facility] [address], [place], [place] [address]    Culture   Final    NO GROWTH 4 DAYS Performed at [facility], [place] [address] [place], [place] [address]    Report Status PENDING  Incomplete  Respiratory Panel by PCR     Status: None   Collection Time: 12/21/18  9:40 PM   Specimen: Nasopharyngeal Swab; Respiratory  Result Value Ref Range Status   Adenovirus NOT DETECTED NOT DETECTED Final   Coronavirus 229E NOT DETECTED NOT DETECTED Final    Comment: (NOTE) The Coronavirus on the Respiratory Panel, DOES NOT test for  the novel  Coronavirus (2019 nCoV)    Coronavirus HKU1 NOT DETECTED NOT DETECTED Final   Coronavirus NL63 NOT DETECTED NOT DETECTED Final   Coronavirus OC43 NOT DETECTED NOT DETECTED Final   Metapneumovirus NOT DETECTED NOT DETECTED Final   Rhinovirus / Enterovirus NOT DETECTED NOT DETECTED Final   Influenza A NOT DETECTED NOT DETECTED Final   Influenza B NOT DETECTED NOT DETECTED Final   Parainfluenza Virus 1 NOT DETECTED NOT DETECTED Final   Parainfluenza Virus 2 NOT DETECTED NOT DETECTED Final   Parainfluenza Virus 3 NOT DETECTED NOT DETECTED Final   Parainfluenza Virus 4 NOT DETECTED NOT DETECTED Final   Respiratory Syncytial Virus NOT DETECTED NOT DETECTED Final   Bordetella pertussis NOT DETECTED NOT DETECTED Final   Chlamydophila pneumoniae NOT DETECTED NOT DETECTED Final   Mycoplasma pneumoniae NOT DETECTED NOT DETECTED Final    Comment: Performed at [facility], [place] [address] [place], [place] [address]     Labs: CBC: Recent Labs  Lab 12/22/18 0525 12/23/18 0357 12/24/18 0135 12/25/18 0155 12/26/18 0225  WBC 14.0* 19.9* 17.5* 17.7* 17.7*  NEUTROABS 12.1* 17.3* 15.0* 14.5* 13.8*  HGB 14.4 13.7 13.2  13.6 13.9  HCT 42.1 41.0 40.6 41.0 41.2  MCV 90.5 91.7 93.3 93.4 92.8  PLT 327 401* 392 400 377   Basic Metabolic Panel: Recent Labs  Lab 12/22/18 0525 12/23/18 0357 12/24/18 0135 12/25/18 0155 12/26/18 0225  NA 135 136 137 134* 136  K 4.2 4.3 5.1 4.6 4.4  CL 99 100 101 100 102  CO2 '24 23 23 ' 21* 22  GLUCOSE 154* 161* 250* 212* 180*  BUN 25* 34* 32* 29* 28*  CREATININE 0.82 0.88 0.84 0.82 0.88  CALCIUM 9.3 9.7 9.7 9.2 9.3  MG 2.4 2.3 2.3 2.3 2.3  PHOS 4.1 3.6 4.1 4.0 3.8   Liver Function Tests: Recent Labs  Lab 12/22/18 0525 12/23/18 0357 12/24/18 0135 12/25/18 0155 12/26/18 0225  AST 61* 70* 61* 55* 45*  ALT 72* 115* 129* 133* 119*  ALKPHOS 51 55 64 54 56  BILITOT 0.5 0.4 0.5 0.4 0.3  PROT 7.3 7.2 6.9 6.6 6.3*  ALBUMIN 3.4* 3.5 3.4* 3.4* 3.1*   No results for input(s): LIPASE, AMYLASE in the last 168 hours. No results for input(s): AMMONIA in the last 168 hours. Cardiac Enzymes: Recent Labs  Lab 12/22/18 0525 12/23/18 0357 12/24/18 0135 12/25/18 0155 12/26/18 0225  CKTOTAL 76 57 46* 48* 49   BNP (last 3 results) Recent Labs    12/21/18 1040  BNP 32.3   CBG: Recent Labs  Lab 12/25/18 1955 12/26/18 0023 12/26/18 0343 12/26/18 0742 12/26/18 1148  GLUCAP 246* 254* 178* 110* 115*   Time spent: 35 minutes  Signed:  Berle Mull  [facility]  12/26/2018

## 2018-12-26 NOTE — Discharge Instructions (Signed)
Person Under Monitoring Name: Harry Carroll  Location: [place] [address]   Infection Prevention Recommendations for Individuals Confirmed to have, or Being Evaluated for, 2019 Novel Coronavirus (COVID-19) Infection Who Receive Care at Home  Individuals who are confirmed to have, or are being evaluated for, COVID-19 should follow the prevention steps below until a healthcare provider or local or state health department says they can return to normal activities.  Stay home except to get medical care You should restrict activities outside your home, except for getting medical care. Do not go to work, school, or public areas, and do not use public transportation or taxis.  Call ahead before visiting your doctor Before your medical appointment, call the healthcare provider and tell them that you have, or are being evaluated for, COVID-19 infection. This will help the healthcare providers office take steps to keep other people from getting infected. Ask your healthcare provider to call the local or state health department.  Monitor your symptoms Seek prompt medical attention if your illness is worsening (e.g., difficulty breathing). Before going to your medical appointment, call the healthcare provider and tell them that you have, or are being evaluated for, COVID-19 infection. Ask your healthcare provider to call the local or state health department.  Wear a facemask You should wear a facemask that covers your nose and mouth when you are in the same room with other people and when you visit a healthcare provider. People who live with or visit you should also wear a facemask while they are in the same room with you.  Separate yourself from other people in your home As much as possible, you should stay in a different room from other people in your home. Also, you should use a separate bathroom, if available.  Avoid sharing household items You should  not share dishes, drinking glasses, cups, eating utensils, towels, bedding, or other items with other people in your home. After using these items, you should wash them thoroughly with soap and water.  Cover your coughs and sneezes Cover your mouth and nose with a tissue when you cough or sneeze, or you can cough or sneeze into your sleeve. Throw used tissues in a lined trash can, and immediately wash your hands with soap and water for at least 20 seconds or use an alcohol-based hand rub.  Wash your [facility] your hands often and thoroughly with soap and water for at least 20 seconds. You can use an alcohol-based hand sanitizer if soap and water are not available and if your hands are not visibly dirty. Avoid touching your eyes, nose, and mouth with unwashed hands.   Prevention Steps for Caregivers and Household Members of Individuals Confirmed to have, or Being Evaluated for, COVID-19 Infection Being Cared for in the Home  If you live with, or provide care at home for, a person confirmed to have, or being evaluated for, COVID-19 infection please follow these guidelines to prevent infection:  Follow healthcare providers instructions Make sure that you understand and can help the patient follow any healthcare provider instructions for all care.  Provide for the patients basic needs You should help the patient with basic needs in the home and provide support for getting groceries, prescriptions, and other personal needs.  Monitor the patients symptoms If they are getting sicker, call his or her medical provider and tell them that the patient has, or is being evaluated for, COVID-19 infection. This will help the healthcare providers  office take steps to keep other people from getting infected. Ask the healthcare provider to call the local or state health department.  Limit the number of people who have contact with the patient  If possible, have only one caregiver for the  patient.  Other household members should stay in another home or place of residence. If this is not possible, they should stay  in another room, or be separated from the patient as much as possible. Use a separate bathroom, if available.  Restrict visitors who do not have an essential need to be in the home.  Keep older adults, very young children, and other sick people away from the patient Keep older adults, very young children, and those who have compromised immune systems or chronic health conditions away from the patient. This includes people with chronic heart, lung, or kidney conditions, diabetes, and cancer.  Ensure good ventilation Make sure that shared spaces in the home have good air flow, such as from an air conditioner or an opened window, weather permitting.  Wash your hands often  Wash your hands often and thoroughly with soap and water for at least 20 seconds. You can use an alcohol based hand sanitizer if soap and water are not available and if your hands are not visibly dirty.  Avoid touching your eyes, nose, and mouth with unwashed hands.  Use disposable paper towels to dry your hands. If not available, use dedicated cloth towels and replace them when they become wet.  Wear a facemask and gloves  Wear a disposable facemask at all times in the room and gloves when you touch or have contact with the patients blood, body fluids, and/or secretions or excretions, such as sweat, saliva, sputum, nasal mucus, vomit, urine, or feces.  Ensure the mask fits over your nose and mouth tightly, and do not touch it during use.  Throw out disposable facemasks and gloves after using them. Do not reuse.  Wash your hands immediately after removing your facemask and gloves.  If your personal clothing becomes contaminated, carefully remove clothing and launder. Wash your hands after handling contaminated clothing.  Place all used disposable facemasks, gloves, and other waste in a lined  container before disposing them with other household waste.  Remove gloves and wash your hands immediately after handling these items.  Do not share dishes, glasses, or other household items with the patient  Avoid sharing household items. You should not share dishes, drinking glasses, cups, eating utensils, towels, bedding, or other items with a patient who is confirmed to have, or being evaluated for, COVID-19 infection.  After the person uses these items, you should wash them thoroughly with soap and water.  Wash laundry thoroughly  Immediately remove and wash clothes or bedding that have blood, body fluids, and/or secretions or excretions, such as sweat, saliva, sputum, nasal mucus, vomit, urine, or feces, on them.  Wear gloves when handling laundry from the patient.  Read and follow directions on labels of laundry or clothing items and detergent. In general, wash and dry with the warmest temperatures recommended on the label.  Clean all areas the individual has used often  Clean all touchable surfaces, such as counters, tabletops, doorknobs, bathroom fixtures, toilets, phones, keyboards, tablets, and bedside tables, every day. Also, clean any surfaces that may have blood, body fluids, and/or secretions or excretions on them.  Wear gloves when cleaning surfaces the patient has come in contact with.  Use a diluted bleach solution (e.g., dilute bleach with 1  part bleach and 10 parts water) or a household disinfectant with a label that says EPA-registered for coronaviruses. To make a bleach solution at home, add 1 tablespoon of bleach to 1 quart (4 cups) of water. For a larger supply, add  cup of bleach to 1 gallon (16 cups) of water.  Read labels of cleaning products and follow recommendations provided on product labels. Labels contain instructions for safe and effective use of the cleaning product including precautions you should take when applying the product, such as wearing gloves or  eye protection and making sure you have good ventilation during use of the product.  Remove gloves and wash hands immediately after cleaning.  Monitor yourself for signs and symptoms of illness Caregivers and household members are considered close contacts, should monitor their health, and will be asked to limit movement outside of the home to the extent possible. Follow the monitoring steps for close contacts listed on the symptom monitoring form.   ? If you have additional questions, contact your local health department or call the epidemiologist on call at [phone] (available 24/7). ? This guidance is subject to change. For the most up-to-date guidance from [facility], please refer to their website: YouBlogs.pl

## 2018-12-27 ENCOUNTER — Telehealth: Payer: Self-pay | Admitting: *Deleted

## 2018-12-27 NOTE — Telephone Encounter (Signed)
Transition Care Management Follow-up Telephone Call   Date discharged? 12/26/18   How have you been since you were released from the hospital? Pt states he is doing pretty good. Trying to stay hydrated, and resting a lot so he can regain his energy back   Do you understand why you were in the hospital? YES, he states the [facility] did an awesome job   Do you understand the discharge instructions? YES   Where were you discharged to? Home   Items Reviewed:  Medications reviewed: YES, currently taking the prednisone taper pack  Allergies reviewed: YES, no changes  Dietary changes reviewed: YES, heart healthy  Referrals reviewed: No referral recommeded   Functional Questionnaire:   Activities of Daily Living (ADLs):   He states he are independent in the following: ambulation, bathing and hygiene, feeding, continence, grooming, toileting and dressing States he doesn't require assistance    Any transportation issues/concerns?: NO   Any patient concerns? NO   Confirmed importance and date/time of follow-up visits scheduled YES, appt (virtual 01/04/19  Provider Appointment booked with Dr. Jenny Reichmann  Confirmed with patient if condition begins to worsen call PCP or go to the ER.  Patient was given the office number and encouraged to call back with question or concerns.  : YES

## 2018-12-31 ENCOUNTER — Telehealth: Payer: Self-pay | Admitting: Internal Medicine

## 2018-12-31 NOTE — Telephone Encounter (Signed)
Ok sounds good

## 2018-12-31 NOTE — Telephone Encounter (Signed)
I have received short term disability forms from [employer] [facility] via fax.   Forms have been completed, I just need a return to work date. Patient has his hospital FU on 7/24, LVM for patient that the doctor will determine at this OV when he returns to work.

## 2019-01-04 ENCOUNTER — Ambulatory Visit (INDEPENDENT_AMBULATORY_CARE_PROVIDER_SITE_OTHER): Payer: 59 | Admitting: Internal Medicine

## 2019-01-04 ENCOUNTER — Encounter: Payer: Self-pay | Admitting: Internal Medicine

## 2019-01-04 DIAGNOSIS — J1282 Pneumonia due to coronavirus disease 2019: Secondary | ICD-10-CM

## 2019-01-04 DIAGNOSIS — J1289 Other viral pneumonia: Secondary | ICD-10-CM | POA: Diagnosis not present

## 2019-01-04 DIAGNOSIS — F320 Major depressive disorder, single episode, mild: Secondary | ICD-10-CM

## 2019-01-04 DIAGNOSIS — J9601 Acute respiratory failure with hypoxia: Secondary | ICD-10-CM

## 2019-01-04 DIAGNOSIS — U071 COVID-19: Secondary | ICD-10-CM

## 2019-01-04 NOTE — Progress Notes (Signed)
Patient ID: Harry Carroll, male   DOB: Sep 23, 1960, 58 y.o.   MRN: [id number]  Virtual Visit via Video Note  I connected with Harry Carroll on 01/04/19 at  1:00 PM EDT by a video enabled telemedicine application and verified that I am speaking with the correct person using two identifiers.  Location: Patient: at home Provider: at office   I discussed the limitations of evaluation and management by telemedicine and the availability of in person appointments. The patient expressed understanding and agreed to proceed.  History of Present Illness: Here to f/u recent hospn wit COVID pna with acute hypoxic resp failure from 7/9 - 7/15; Patient symptoms started about 2 weeks prior with fever chills productive cough diarrhea and subsequently since symptoms persisted patient had COVID-19 test done which came positive. Following which patient symptoms have greatly resolved but over the next 24 to 48 hours patient started becoming short of breath on minimal exertion, found to have low o2 sat in ED.  Chest x-ray shows bilateral infiltrates concerning for pneumonia. Patient was febrile with temperature around 100.4 F. Labs show COVID-19 test was positive again.  Pt initialy tx with o2 and one dose antibx for ? CAP, and afterwards:  Remdesivir: Met criteria on admission.  Completed 5-day treatment course.  Steroids: Treated with IV Solu-Medrol 40 3 times daily for 5 days.  Transitoned to oral prednisone taper and now nearly finished.  O2 sat at home today 93-95% at rest, gets about room to room without significant worsening DOE.  No fever,  Does have some mild right pleuritic CP, last WBC > 17 after steroid tx.  Chief complaint today is fatigue, and just does not think he can manage at work yet.  Last day of work was July 2.  After d/w pt, ok for return to work Aug 10, with plan for f/u cxr and lab f/u aug 5.  Denies worsening depressive symptoms, suicidal ideation, or panic Observations/Objective: Alert, NAD,  appropriate mood and affect, resps normal, cn 2-12 intact, moves all 4s, no visible rash or swelling Lab Results  Component Value Date   WBC 17.7 (H) 12/26/2018   HGB 13.9 12/26/2018   HCT 41.2 12/26/2018   PLT 390 12/26/2018   GLUCOSE 180 (H) 12/26/2018   CHOL 106 12/22/2018   TRIG 104 12/22/2018   HDL 31 (L) 12/22/2018   LDLDIRECT 172.9 08/31/2012   LDLCALC 54 12/22/2018   ALT 119 (H) 12/26/2018   AST 45 (H) 12/26/2018   NA 136 12/26/2018   K 4.4 12/26/2018   CL 102 12/26/2018   CREATININE 0.88 12/26/2018   BUN 28 (H) 12/26/2018   CO2 22 12/26/2018   TSH 2.22 08/01/2018   PSA 1.26 08/01/2018   INR 0.91 05/07/2018   HGBA1C 6.3 (H) 12/25/2018   Assessment and Plan: See notes  Follow Up Instructions: See notes   I discussed the assessment and treatment plan with the patient. The patient was provided an opportunity to ask questions and all were answered. The patient agreed with the plan and demonstrated an understanding of the instructions.   The patient was advised to call back or seek an in-person evaluation if the symptoms worsen or if the condition fails to improve as anticipated.   Cathlean Cower, MD

## 2019-01-05 ENCOUNTER — Encounter: Payer: Self-pay | Admitting: Internal Medicine

## 2019-01-05 NOTE — Assessment & Plan Note (Addendum)
D/w pt no need for repeat testing prior to return to work; for f/u cxr and lab aug 5 as discussed, and return to work Jan 21 2019

## 2019-01-05 NOTE — Patient Instructions (Signed)
Please continue all other medications as before, and refills have been done if requested.  Please have the pharmacy call with any other refills you may need.  Please keep your appointments with your specialists as you may have planned  Please go to the XRAY Department in the Basement (go straight as you get off the elevator) for the x-ray testing on Aug 5  Please go to the LAB in the Basement (turn left off the elevator) for the tests to be done Aug 5  OK for disability form to state return to work date of aug 10

## 2019-01-05 NOTE — Assessment & Plan Note (Signed)
Resolved, to finish prednisone as rx

## 2019-01-05 NOTE — Assessment & Plan Note (Signed)
stable overall by history and exam, recent data reviewed with pt, and pt to continue medical treatment as before,  to f/u any worsening symptoms or concerns  

## 2019-01-08 DIAGNOSIS — Z0279 Encounter for issue of other medical certificate: Secondary | ICD-10-CM

## 2019-01-08 NOTE — Telephone Encounter (Signed)
Returning to work on 01/21/19  Forms have been signed, Faxed to [employer], Copy sent to scan &charged for.   Original has been mailed to patient.

## 2019-01-10 ENCOUNTER — Other Ambulatory Visit: Payer: Self-pay | Admitting: *Deleted

## 2019-01-10 DIAGNOSIS — R0602 Shortness of breath: Secondary | ICD-10-CM

## 2019-01-10 NOTE — Progress Notes (Signed)
echo

## 2019-01-11 ENCOUNTER — Inpatient Hospital Stay: Payer: 59 | Admitting: Internal Medicine

## 2019-01-14 ENCOUNTER — Encounter: Payer: Self-pay | Admitting: Internal Medicine

## 2019-01-16 ENCOUNTER — Encounter: Payer: Self-pay | Admitting: Internal Medicine

## 2019-01-16 ENCOUNTER — Ambulatory Visit (INDEPENDENT_AMBULATORY_CARE_PROVIDER_SITE_OTHER)
Admission: RE | Admit: 2019-01-16 | Discharge: 2019-01-16 | Disposition: A | Discharge status: Home / Self Care | Payer: 59 | Source: Ambulatory Visit | Attending: Internal Medicine | Admitting: Internal Medicine

## 2019-01-16 ENCOUNTER — Other Ambulatory Visit: Payer: Self-pay

## 2019-01-16 ENCOUNTER — Other Ambulatory Visit (INDEPENDENT_AMBULATORY_CARE_PROVIDER_SITE_OTHER): Payer: 59

## 2019-01-16 DIAGNOSIS — U071 COVID-19: Secondary | ICD-10-CM

## 2019-01-16 DIAGNOSIS — J1282 Pneumonia due to coronavirus disease 2019: Secondary | ICD-10-CM

## 2019-01-16 DIAGNOSIS — J1289 Other viral pneumonia: Secondary | ICD-10-CM

## 2019-01-16 LAB — HEPATIC FUNCTION PANEL
ALT: 98 U/L — ABNORMAL HIGH (ref 0–53)
AST: 57 U/L — ABNORMAL HIGH (ref 0–37)
Albumin: 4.3 g/dL (ref 3.5–5.2)
Alkaline Phosphatase: 45 U/L (ref 39–117)
Bilirubin, Direct: 0.2 mg/dL (ref 0.0–0.3)
Total Bilirubin: 0.7 mg/dL (ref 0.2–1.2)
Total Protein: 7.3 g/dL (ref 6.0–8.3)

## 2019-01-16 LAB — CBC WITH DIFFERENTIAL/PLATELET
Basophils Absolute: 0.1 10*3/uL (ref 0.0–0.1)
Basophils Relative: 1.4 % (ref 0.0–3.0)
Eosinophils Absolute: 0.4 10*3/uL (ref 0.0–0.7)
Eosinophils Relative: 7.4 % — ABNORMAL HIGH (ref 0.0–5.0)
HCT: 43.6 % (ref 39.0–52.0)
Hemoglobin: 15 g/dL (ref 13.0–17.0)
Lymphocytes Relative: 30.4 % (ref 12.0–46.0)
Lymphs Abs: 1.5 10*3/uL (ref 0.7–4.0)
MCHC: 34.5 g/dL (ref 30.0–36.0)
MCV: 90.8 fl (ref 78.0–100.0)
Monocytes Absolute: 0.4 10*3/uL (ref 0.1–1.0)
Monocytes Relative: 7.6 % (ref 3.0–12.0)
Neutro Abs: 2.6 10*3/uL (ref 1.4–7.7)
Neutrophils Relative %: 53.2 % (ref 43.0–77.0)
Platelets: 185 10*3/uL (ref 150.0–400.0)
RBC: 4.81 Mil/uL (ref 4.22–5.81)
RDW: 14.1 % (ref 11.5–15.5)
WBC: 4.9 10*3/uL (ref 4.0–10.5)

## 2019-01-16 LAB — BASIC METABOLIC PANEL
BUN: 12 mg/dL (ref 6–23)
CO2: 26 mEq/L (ref 19–32)
Calcium: 9.9 mg/dL (ref 8.4–10.5)
Chloride: 104 mEq/L (ref 96–112)
Creatinine, Ser: 0.88 mg/dL (ref 0.40–1.50)
GFR: 88.44 mL/min (ref >60.00)
Glucose, Bld: 116 mg/dL — ABNORMAL HIGH (ref 70–99)
Potassium: 4.2 mEq/L (ref 3.5–5.1)
Sodium: 137 mEq/L (ref 135–145)

## 2019-01-16 LAB — SEDIMENTATION RATE: Sed Rate: 38 mm/hr — ABNORMAL HIGH (ref 0–20)

## 2019-01-17 ENCOUNTER — Telehealth: Payer: Self-pay | Admitting: Internal Medicine

## 2019-01-17 NOTE — Telephone Encounter (Signed)
LVM with details below 

## 2019-01-17 NOTE — Telephone Encounter (Signed)
Pt. Reports he was recently hospitalized with COVID 19 and has been out of the hospital for about 3 weeks. Would like to know from Dr. Jenny Reichmann if it is ok for him to be out in public, masking of course, and seeing family members. Please advise pt.

## 2019-01-17 NOTE — Telephone Encounter (Signed)
Yes, his chance of infectivity is very low at this point to other persons

## 2019-01-21 ENCOUNTER — Telehealth: Payer: Self-pay

## 2019-01-21 NOTE — Telephone Encounter (Signed)
Patient is calling back [phone]

## 2019-01-21 NOTE — Telephone Encounter (Signed)
Route message to [facility]. Go ahead and hold a spot for tomorrow  and tentativly let the patient know,  but it needs to be Dr. Gustavus Bryant call as he would be seeing the patient. Someone needs to check with Wert first thing in the morning to make sure he is ok with this plan. Thanks

## 2019-01-21 NOTE — Telephone Encounter (Signed)
Spoke with the pt's spouse  Pt has referral for PNA from Dr Jenny Reichmann but no appt scheduled yet  Harry Carroll- this pt had covid and was at [facility]- he tested pos on 12/11/18  He has been home for over 3 wks now and still having cough with large amounts of clear sputum and SOB  She states had recent cxr and was told that the cxr was worse than the first one he had  She wants him seen here ASAP  He has not been tested again, but states that Dr Jenny Reichmann told him that he was no longer contagious and he went to their office and had labs done recently He is not having any fever, chills, n/v/d, loss of taste or smell, body aches, joint pain  Can you please advise if ok to go ahead and schedule with MD  I could possibly get him in with [facility] tomorrow

## 2019-01-21 NOTE — Telephone Encounter (Signed)
Dr Melvyn Novas- your schedule is full now but are you willing to work this pt in? Spouse is very concerned  I could get him in end of the day? Please advise thanks

## 2019-01-21 NOTE — Telephone Encounter (Signed)
Pt was at [facility] 7/9-7/16 due to covid virus, SOB, and pneumonia associated.  Pt's positive covid test was 12/11/2018. On 7/10, pt did have a respiratory panel performed and it shows that the coronavirus was not detected.  Referral in pt's chart is from pt's PCP and it does say in here pneumonia due to covid.   Attempted to call pt's wife Shirlean Mylar but unable to reach. Left message for Shirlean Mylar to return call.

## 2019-01-22 ENCOUNTER — Ambulatory Visit: Payer: 59 | Admitting: Internal Medicine

## 2019-01-22 ENCOUNTER — Encounter: Payer: Self-pay | Admitting: Internal Medicine

## 2019-01-22 ENCOUNTER — Other Ambulatory Visit: Payer: Self-pay

## 2019-01-22 DIAGNOSIS — U071 COVID-19: Secondary | ICD-10-CM

## 2019-01-22 NOTE — Telephone Encounter (Signed)
Called and spoke with pt's wife Harry Carroll letting her know that MW said we could get pt in today for an appt. Robin verbalized understanding. Pt has been scheduled an appt today with MW at 1:30. I provided Harry Carroll our office address and made sure that pt would wear a mask. Harry Carroll said that pt does have a mask that has a filter in it. Harry Carroll is also sending several paperwork with pt to the appt. Nothing further needed.

## 2019-01-22 NOTE — Patient Instructions (Signed)
Check your 02 saturations levels at peak exercise every few days and see if there's a trend.  To get the most out of exercise, you need to be continuously aware that you are short of breath, but never out of breath, for 30 minutes daily. As you improve, it will actually be easier for you to do the same amount of exercise  in  30 minutes so always push to the level where you are short of breath.      Please schedule a follow up office visit in 2 weeks, sooner if needed with cxr on return

## 2019-01-22 NOTE — Telephone Encounter (Signed)
That's fine

## 2019-01-22 NOTE — Progress Notes (Addendum)
[name], male    DOB: February 17, 1961,   MRN: [id number]   Brief patient profile:  58 yowm former cigar smoker with OSA previously under care of [facility] but not seen since 2015 returned from [facility] end of June 2020  with COVID as did his wife and sister and admitted on around day 13 of illness to [place]:  Date of admission: 12/20/2018             Date of discharge:  12/26/2018    Discharge Diagnoses:  Principal Problem:   Acute respiratory failure with hypoxia ([place]) secondary to COVID-19 infection Active Problems:   BIPOLAR DISORDER UNSPECIFIED   INSOMNIA   OSA (obstructive sleep apnea)   History of colonic polyps   Obesity   COVID-19 virus infection   Depression  Activity: The patient is advised to gradually reintroduce usual activities,as tolerated .  Discharge Condition: good  Code Status: Full code  History of present illness: As per the H and P dictated on admission, "Tyrek Lawhorn Trumpis a 58 y.o.malewithhistory of hyperlipidemia presents to the ER because of shortness of breath. Patient states he was diagnosed with COVID-19 about a week ago. Patient symptoms started about 2 weeks pta with fever chills productive cough diarrhea and subsequently since symptoms persisted patient had COVID-19 test done which came positive. Following which patient symptoms have greatly resolved but over the last 24 to 48 hours patient started becoming short of breath on minimal exertion. Denies any swelling of the extremities or chest pain.  ED Course:In the ER chest x-ray shows bilateral infiltrates concerning for pneumonia. Patient was febrile with temperature around 100.4 F. Labs show COVID-19 test was positive again. WBC is 8 hemoglobin 15 creatinine 1. Patient admitted for acute respiratory failure secondary to COVID pneumonia."  Hospital Course:  Summary of his active problems in the hospital is as following.  Acute respiratory failure with hypoxia  COVID 19 pneumonia   Recent Labs       Lab Results  Component Value Date   SARSCOV2NAA Detected (A) 12/11/2018     CXR: hazy bilateral peripheral opacities  Recent Labs (last 2 labs)        Recent Labs    12/24/18 0135 12/25/18 0155 12/26/18 0225  DDIMER 0.84* 1.29* 1.50*  FERRITIN 982* 849* 666*  CRP 4.5* 2.5* 1.8*     Tmax: Afebrile Oxygen requirements: On room air both at rest as well as on exertion  Antibiotics: 1 dose of IV ceftriaxone and azithromycin.  No further antibiotic. Diuretics: none Vitamin C and Zinc: Continue on discharge DVT Prophylaxis: Subcutaneous Lovenox none on discharge Remdesivir: Met criteria on admission.  Completed 5-day treatment course. Steroids: Treated with IV Solu-Medrol 40 3 times daily for 5 days.  Transitioning to oral prednisone taper. Actemra: Not used.  During this encounter: Patient Isolation: Airborne + Droplet + Contact HCP PPE: CAPR, gown. gloves Patient PPE: None  Disposition: Significant improvement in patient's inflammatory markers as well as oxygen requirement currently it is felt that the patient can safely be discharged home with oral steroids and vitamins and zinc.  Recommend outpatient follow-up with PCP. Patient's family is already tested positive for COVID test therefore does not require any specific isolation at home.  Isolation guidelines provided for the patient.  The treatment plan and use of medications and known side effects were discussed with patient/family. It was clearly explained that there is no proven definitive treatment for COVID-19 infection yet. Any medications used here are based  on case reports/anecdotal data which are not peer-reviewed and has not been studied using randomized control trials.  Complete risks and long-term side effects are unknown, however in the best clinical judgment they seem to be of some clinical benefit rather than medical risks.  Patient/family agree with the treatment plan and want to receive  these treatments as indicated.   OSA Required at night Pt was given high flow nasal cannula  Bradycardia/pauses Overnight patient was seen to have episode of bradycardia During the day patient remains asymptomatic. Appropriate augmentation of heart rate and activity.  No further work-up for now.  Depression/bipolar unspecified Patient not on medication for depression or bipolar disease. Stable  HLD Crestor 20 mg daily  Obesity Body mass index is 37.44 kg/m.   Bilateral leg edema. Patient reports edema significantly improving. Likely will remain while the patient is recovering from poor weight as well as on steroids.  No calf tenderness.  Recommend continue ambulation at home.  Patient was ambulatory without any assistance. On the day of the discharge the patient's vitals were stable, and no other acute medical condition were reported by patient. the patient was felt safe to be discharge at Home with no therapy needed on discharge.     History of Present Illness  01/22/2019  Pulmonary/ 1st office eval/Reather Steller  Chief Complaint  Patient presents with  . Pulmonary Consult    Referred by Dr. Jenny Reichmann for eval of PNA. Pt had Covid dxed 12/11/18.  Pt c/o SOB walking up and down stairs and has a discomfort in his chest when he takes a deep breath. He has coughing spells and coughs up white sputum. He is using his albuterol 2 x daily.    Dyspnea:  MMRC3 = can't walk 100 yards even at a slow pace at a flat grade s stopping due to sob  Since discharge with nl activity tol baseline though no aerobics Cough: on deep breath > 1-2 once of white mucus off pred x one week no worse  Sleep: off cpap x 6 m able to lie flat ok SABA use: sets off a cough   No obvious day to day or daytime variability or assoc excess/ purulent sputum or mucus plugs or hemoptysis or cp or chest tightness, subjective wheeze or overt sinus or hb symptoms.   Sleeping fine now  without nocturnal  or early am  exacerbation  of respiratory  c/o's or need for noct saba. Also denies any obvious fluctuation of symptoms with weather or environmental changes or other aggravating or alleviating factors except as outlined above   No unusual exposure hx or h/o childhood pna/ asthma or knowledge of premature birth.  Current Allergies, Complete Past Medical History, Past Surgical History, Family History, and Social History were reviewed in [employer] record.  ROS  The following are not active complaints unless bolded Hoarseness, sore throat, dysphagia, dental problems, itching, sneezing,  nasal congestion or discharge of excess mucus or purulent secretions, ear ache,   fever, chills, sweats, unintended wt loss or wt gain, classically pleuritic or exertional cp,  orthopnea pnd or arm/hand swelling  or leg swelling, presyncope, palpitations, abdominal pain, anorexia, nausea, vomiting, diarrhea  or change in bowel habits or change in bladder habits, change in stools or change in urine, dysuria, hematuria,  rash, arthralgias, visual complaints, headache, numbness, weakness or ataxia or problems with walking or coordination,  change in mood or  memory.           Past  Medical History:  Diagnosis Date  . Arthritis   . Bipolar 1 disorder (HCC)    Dr Clovis Pu , pt denies  . Carotid stenosis   . Carpal tunnel syndrome    saw Dr Kristie Cowman , pt unaware  . Cervical spinal stenosis   . Colon polyps    adenomatous  . Complication of anesthesia    trouble placing catheter with last surgeery, had to get urologist  . Diverticulitis   . Diverticulosis   . Heart murmur    pt denies  . History of hiatal hernia    resolved  . Hyperlipemia   . Insomnia    related to pain  . Normal cardiac stress test    CP 01-2009---Nl ECHO and stress test neg  . Numbness of fingers of both hands    when sleeps, right hand worse than left  . PVC (premature ventricular contraction)    history of PVC noted while in  [employer]  . Restless leg   . Sleep apnea    does not use cpap due to does not like    Outpatient Medications Prior to Visit  Medication Sig Dispense Refill  . acetaminophen (TYLENOL) 500 MG tablet Take 1,000 mg by mouth every 6 (six) hours as needed for mild pain or fever.     Marland Kitchen albuterol (VENTOLIN HFA) 108 (90 Base) MCG/ACT inhaler Inhale 2 puffs into the lungs every 6 (six) hours as needed for wheezing or shortness of breath. 18 g 0  . rosuvastatin (CRESTOR) 20 MG tablet TAKE 1 TABLET BY MOUTH EVERY DAY (Patient taking differently: Take 20 mg by mouth at bedtime. ) 90 tablet 3  . vitamin C (VITAMIN C) 500 MG tablet Take 1 tablet (500 mg total) by mouth daily. 30 tablet 0  . zinc sulfate 220 (50 Zn) MG capsule Take 1 capsule (220 mg total) by mouth daily. 30 capsule 0              Objective:     BP 130/80 (BP Location: Left Arm, Cuff Size: Normal) Comment: on RA  Pulse (!) 105   Temp 98.2 F (36.8 C) (Oral)   Ht 5' 8.5" (1.74 m)   Wt 264 lb (119.7 kg)   SpO2 93% Comment: on RA  BMI 39.56 kg/m   SpO2: 93 %(on RA)   amb obese wm nad  HEENT: nl dentition, turbinates bilaterally, and oropharynx. Nl external ear canals without cough reflex   NECK :  without JVD/Nodes/TM/ nl carotid upstrokes bilaterally   LUNGS: no acc muscle use,  Nl contour chest with very min insp > exp rhonchi bases  bilaterally without cough on insp or exp maneuvers   CV:  RRR  no s3 or murmur or increase in P2, and no edema   ABD:  soft and nontender with nl inspiratory excursion in the supine position. No bruits or organomegaly appreciated, bowel sounds nl  MS:  Nl gait/ ext warm without deformities, calf tenderness, cyanosis or clubbing No obvious joint restrictions   SKIN: warm and dry without lesions    NEURO:  alert, approp, nl sensorium with  no motor or cerebellar deficits apparent.     I personally reviewed images and agree with radiology impression as follows:  CXR:   01/16/19   Worsened appearance of patchy bilateral airspace disease compatible with pneumonia. Atypical/viral infection is possible. My review:  cxr is being compared to admit portable view 12/20/18, not his nadir clinically      Assessment  COVID-19 virus infection Onset of symptoms around December 07 2018 p trip to [facility] > admit 12/20/2018 to  [facility] req 02,  Given solumedrol  and remdesivir and pred taper off  -  01/22/2019   Walked RA  2 laps @ approx 255f each @ avg pace  stopped due to end of study, no desats, mild sob   Clearly better in all regards x for cough and sob that have not worsened since d/c prednisone so at this point I would not start back on pred as the evidence that it helps at any point in this illness is anecdotal and not established  - also no need for bronchodilators as he feels they just make his cough wors at this point  Explained lag time with ALI in terms of allowing the lung to now go thru the normal reparative process and close f/u here to assure he remains on course.    >>> rec regular walking with 02 sats monitoring / recording at peak ex and call if trending down, o/w f/u in 2 weeks with cxr on return.      Total time devoted to counseling  > 50 % of initial 60 min office visit:  reviewed case with pt/ directly observed portions of ambulatory 02 saturation study/  discussion of options/alternatives/ personally creating written customized instructions  in presence of pt  then going over those specific  Instructions directly with the pt including how to use all of the meds but in particular covering each new medication in detail and the difference between the maintenance= "automatic" meds and the prns using an action plan format for the latter (If this problem/symptom => do that organization reading Left to right).  Please see AVS from this visit for a full list of these instructions which I personally wrote for this pt and  are unique to this visit.         MChristinia Gully MD  01/22/2019

## 2019-01-23 ENCOUNTER — Encounter: Payer: Self-pay | Admitting: Internal Medicine

## 2019-01-23 ENCOUNTER — Institutional Professional Consult (permissible substitution): Payer: 59 | Admitting: Internal Medicine

## 2019-01-23 NOTE — Assessment & Plan Note (Signed)
Onset of symptoms around December 07 2018 p trip to [facility] > admit 12/20/2018 to  [facility] req 02,  Given solumedrol  and remdesivir and pred taper off  -  01/22/2019   Walked RA  2 laps @ approx 23ft each @ avg pace  stopped due to end of study, no desats, mild sob   Clearly better in all regards x for cough and sob that have not worsened since d/c prednisone so at this point I would not start back on pred as the evidence that it helps at any point in this illness is anecdotal and not established  - also no need for bronchodilators as he feels they just make his cough wors at this point  Explained lag time with ALI in terms of allowing the lung to now go thru the normal reparative process and close f/u here to assure he remains on course.    >>> rec regular walking with 02 sats monitoring / recording at peak ex and call if trending down, o/w f/u in 2 weeks with cxr on return.   Total time devoted to counseling  > 50 % of initial 60 min office visit:  reviewed case with pt/ directly observed portions of ambulatory 02 saturation study/  discussion of options/alternatives/ personally creating written customized instructions  in presence of pt  then going over those specific  Instructions directly with the pt including how to use all of the meds but in particular covering each new medication in detail and the difference between the maintenance= "automatic" meds and the prns using an action plan format for the latter (If this problem/symptom => do that organization reading Left to right).  Please see AVS from this visit for a full list of these instructions which I personally wrote for this pt and  are unique to this visit.

## 2019-02-05 ENCOUNTER — Ambulatory Visit (INDEPENDENT_AMBULATORY_CARE_PROVIDER_SITE_OTHER): Payer: 59

## 2019-02-05 ENCOUNTER — Other Ambulatory Visit: Payer: Self-pay

## 2019-02-05 ENCOUNTER — Ambulatory Visit: Payer: 59 | Admitting: Internal Medicine

## 2019-02-05 ENCOUNTER — Encounter: Payer: Self-pay | Admitting: Internal Medicine

## 2019-02-05 VITALS — BP 118/72 | HR 90 | Temp 97.6°F | Ht 68.5 in | Wt 267.0 lb

## 2019-02-05 DIAGNOSIS — Z23 Encounter for immunization: Secondary | ICD-10-CM | POA: Diagnosis not present

## 2019-02-05 DIAGNOSIS — U071 COVID-19: Secondary | ICD-10-CM

## 2019-02-05 NOTE — Assessment & Plan Note (Signed)
Onset of symptoms around December 07 2018 p trip to [facility] > admit 12/20/2018 to  [facility] req 02,  Given solumedrol  and remdesivir and pred taper off by end of July 2020 -  01/22/2019   Walked RA  2 laps @ approx 215ft each @ avg pace  stopped due to end of study, no desats, mild sob sats 96% at end   - 02/05/2019   [facility] RA  2 laps @  approx 228ft each @ mod fast pace  stopped due to  End of study, mild sob with sats 93%     Lab Results  Component Value Date   ESRSEDRATE 38 (H) 01/16/2019   ESRSEDRATE 84 (H) 12/21/2018    His sats are a bit low further out from last pred dose but his tolerance and speed walking are much better.  We don't have enough experience to extrapolate re whether there will be significant PF at 6 months as there are is in typical [place] pf but he's making good progress and just needs to keep up the walking and sat monitoring and call if losing any ground  Discussed in detail all the  indications, usual  risks and alternatives  relative to the benefits with patient who agrees to proceed with conservative f/u with cxr and walk in 6 weeks   I had an extended discussion with the patient  reviewing all relevant studies completed to date and  lasting 15 to 20 minutes of a 25 minute visit  which included directly observing ambulatory 02 saturation study documented in a/p section of  today's  office note.  Each maintenance medication was reviewed in detail including most importantly the difference between maintenance and prns and under what circumstances the prns are to be triggered using an action plan format that is not reflected in the computer generated alphabetically organized AVS.     Please see AVS for specific instructions unique to this visit that I personally wrote and verbalized to the the pt in detail and then reviewed with pt  by my nurse highlighting any changes in therapy recommended at today's visit .

## 2019-02-05 NOTE — Progress Notes (Signed)
Harry Carroll, male    DOB: February 17, 1961,   MRN: [id number]   Brief patient profile:  42 yowm former cigar smoker with OSA previously under care of [facility] but not seen since 2015 returned from [facility] end of June 2020  with COVID as did his wife and sister and admitted on around day 13 of illness to [place]:  Date of admission: 12/20/2018             Date of discharge:  12/26/2018    Discharge Diagnoses:  Principal Problem:   Acute respiratory failure with hypoxia ([place]) secondary to COVID-19 infection Active Problems:   BIPOLAR DISORDER UNSPECIFIED   INSOMNIA   OSA (obstructive sleep apnea)   History of colonic polyps   Obesity   COVID-19 virus infection   Depression  Activity: The patient is advised to gradually reintroduce usual activities,as tolerated .  Discharge Condition: good  Code Status: Full code  History of present illness: As per the H and P dictated on admission, "Harry Carroll a 57 y.o.malewithhistory of hyperlipidemia presents to the ER because of shortness of breath. Patient states he was diagnosed with COVID-19 about a week ago. Patient symptoms started about 2 weeks pta with fever chills productive cough diarrhea and subsequently since symptoms persisted patient had COVID-19 test done which came positive. Following which patient symptoms have greatly resolved but over the last 24 to 48 hours patient started becoming short of breath on minimal exertion. Denies any swelling of the extremities or chest pain.  ED Course:In the ER chest x-ray shows bilateral infiltrates concerning for pneumonia. Patient was febrile with temperature around 100.4 F. Labs show COVID-19 test was positive again. WBC is 8 hemoglobin 15 creatinine 1. Patient admitted for acute respiratory failure secondary to COVID pneumonia."  Hospital Course:  Summary of his active problems in the hospital is as following.  Acute respiratory failure with hypoxia  COVID 19 pneumonia   Recent Labs       Lab Results  Component Value Date   SARSCOV2NAA Detected (A) 12/11/2018     CXR: hazy bilateral peripheral opacities  Recent Labs (last 2 labs)        Recent Labs    12/24/18 0135 12/25/18 0155 12/26/18 0225  DDIMER 0.84* 1.29* 1.50*  FERRITIN 982* 849* 666*  CRP 4.5* 2.5* 1.8*     Tmax: Afebrile Oxygen requirements: On room air both at rest as well as on exertion  Antibiotics: 1 dose of IV ceftriaxone and azithromycin.  No further antibiotic. Diuretics: none Vitamin C and Zinc: Continue on discharge DVT Prophylaxis: Subcutaneous Lovenox none on discharge Remdesivir: Met criteria on admission.  Completed 5-day treatment course. Steroids: Treated with IV Solu-Medrol 40 3 times daily for 5 days.  Transitioning to oral prednisone taper. Actemra: Not used.  During this encounter: Patient Isolation: Airborne + Droplet + Contact HCP PPE: CAPR, gown. gloves Patient PPE: None  Disposition: Significant improvement in patient's inflammatory markers as well as oxygen requirement currently it is felt that the patient can safely be discharged home with oral steroids and vitamins and zinc.  Recommend outpatient follow-up with PCP. Patient's family is already tested positive for COVID test therefore does not require any specific isolation at home.  Isolation guidelines provided for the patient.  The treatment plan and use of medications and known side effects were discussed with patient/family. It was clearly explained that there is no proven definitive treatment for COVID-19 infection yet. Any medications used here are based  on case reports/anecdotal data which are not peer-reviewed and has not been studied using randomized control trials.  Complete risks and long-term side effects are unknown, however in the best clinical judgment they seem to be of some clinical benefit rather than medical risks.  Patient/family agree with the treatment plan and want to receive  these treatments as indicated.   OSA Required at night Pt was given high flow nasal cannula  Bradycardia/pauses Overnight patient was seen to have episode of bradycardia During the day patient remains asymptomatic. Appropriate augmentation of heart rate and activity.  No further work-up for now.  Depression/bipolar unspecified Patient not on medication for depression or bipolar disease. Stable  HLD Crestor 20 mg daily  Obesity Body mass index is 37.44 kg/m.   Bilateral leg edema. Patient reports edema significantly improving. Likely will remain while the patient is recovering from poor weight as well as on steroids.  No calf tenderness.  Recommend continue ambulation at home.  Patient was ambulatory without any assistance. On the day of the discharge the patient's vitals were stable, and no other acute medical condition were reported by patient. the patient was felt safe to be discharge at Home with no therapy needed on discharge.     History of Present Illness  01/22/2019  Pulmonary/ 1st office eval/[facility]  Chief Complaint  Patient presents with  . Pulmonary Consult    Referred by Dr. Jenny Reichmann for eval of PNA. Pt had Covid dxed 12/11/18.  Pt c/o SOB walking up and down stairs and has a discomfort in his chest when he takes a deep breath. He has coughing spells and coughs up white sputum. He is using his albuterol 2 x daily.    Dyspnea:  MMRC3 = can't walk 100 yards even at a slow pace at a flat grade s stopping due to sob  Since discharge with nl activity tol baseline though no aerobics Cough: on deep breath > 1-2 once of white mucus off pred x one week no worse  Sleep: off cpap x 6 m able to lie flat ok SABA use: sets off a cough  rec Check your 02 saturations levels at peak exercise every few days and see if there's a trend. To get the most out of exercise, you need to be continuously aware that you are short of breath, but never out of breath, for 30 minutes daily. As  you improve, it will actually be easier for you to do the same amount of exercise  in  30 minutes so always push to the level where you are short of breath      02/05/2019  f/u ov/[facility] re: post covid pf f/u  Chief Complaint  Patient presents with  . Follow-up    CXR repeated today.  Breathing has improved some but not back to his normal baseline. He has not needed his albuterol recently.    Dyspnea:  Walking x 1-2  min on bowflex / nothing outdoors  Cough: no cough / no meds for cough  Sleeping: not on cpap for years but plans to go back on it  SABA use: not using now 02: not using    No obvious day to day or daytime variability or assoc excess/ purulent sputum or mucus plugs or hemoptysis or cp or chest tightness, subjective wheeze or overt sinus or hb symptoms.   sleeping without nocturnal  or early am exacerbation  of respiratory  c/o's or need for noct saba. Also denies any obvious fluctuation of  symptoms with weather or environmental changes or other aggravating or alleviating factors except as outlined above   No unusual exposure hx or h/o childhood pna/ asthma or knowledge of premature birth.  Current Allergies, Complete Past Medical History, Past Surgical History, Family History, and Social History were reviewed in [employer] record.  ROS  The following are not active complaints unless bolded Hoarseness, sore throat, dysphagia, dental problems, itching, sneezing,  nasal congestion or discharge of excess mucus or purulent secretions, ear ache,   fever, chills, sweats, unintended wt loss or wt gain, classically pleuritic or exertional cp,  orthopnea pnd or arm/hand swelling  or leg swelling, presyncope, palpitations, abdominal pain, anorexia, nausea, vomiting, diarrhea  or change in bowel habits or change in bladder habits, change in stools or change in urine, dysuria, hematuria,  rash, arthralgias, visual complaints, headache, numbness, weakness or ataxia or  problems with walking or coordination,  change in mood or  memory.        Current Meds  Medication Sig  . acetaminophen (TYLENOL) 500 MG tablet Take 1,000 mg by mouth every 6 (six) hours as needed for mild pain or fever.   Marland Kitchen albuterol (VENTOLIN HFA) 108 (90 Base) MCG/ACT inhaler Inhale 2 puffs into the lungs every 6 (six) hours as needed for wheezing or shortness of breath.  . rosuvastatin (CRESTOR) 20 MG tablet TAKE 1 TABLET BY MOUTH EVERY DAY (Patient taking differently: Take 20 mg by mouth at bedtime. )  . vitamin C (VITAMIN C) 500 MG tablet Take 1 tablet (500 mg total) by mouth daily.  Marland Kitchen zinc sulfate 220 (50 Zn) MG capsule Take 1 capsule (220 mg total) by mouth daily.             Past Medical History:  Diagnosis Date  . Arthritis   . Bipolar 1 disorder (HCC)    Dr Clovis Pu , pt denies  . Carotid stenosis   . Carpal tunnel syndrome    saw Dr Kristie Cowman , pt unaware  . Cervical spinal stenosis   . Colon polyps    adenomatous  . Complication of anesthesia    trouble placing catheter with last surgeery, had to get urologist  . Diverticulitis   . Diverticulosis   . Heart murmur    pt denies  . History of hiatal hernia    resolved  . Hyperlipemia   . Insomnia    related to pain  . Normal cardiac stress test    CP 01-2009---Nl ECHO and stress test neg  . Numbness of fingers of both hands    when sleeps, right hand worse than left  . PVC (premature ventricular contraction)    history of PVC noted while in [employer]  . Restless leg   . Sleep apnea    does not use cpap due to does not like       Objective:      amb obese wm nad  Wt Readings from Last 3 Encounters:  02/05/19 267 lb (121.1 kg)  01/22/19 264 lb (119.7 kg)  12/25/18 253 lb 8.5 oz (115 kg)     Vital signs reviewed - Note on arrival 02 sats  96% on RA     HEENT: nl dentition, turbinates bilaterally, and oropharynx. Nl external ear canals without cough reflex   NECK :  without JVD/Nodes/TM/ nl carotid  upstrokes bilaterally   LUNGS: no acc muscle use,  Nl contour chest which is clear to A and P bilaterally without cough on  insp or exp maneuvers   CV:  RRR  no s3 or murmur or increase in P2, and no edema   ABD:  soft and nontender with nl inspiratory excursion in the supine position. No bruits or organomegaly appreciated, bowel sounds nl  MS:  Nl gait/ ext warm without deformities, calf tenderness, cyanosis or clubbing No obvious joint restrictions   SKIN: warm and dry without lesions    NEURO:  alert, approp, nl sensorium with  no motor or cerebellar deficits apparent.     CXR PA and Lateral:   02/05/2019 :    I personally reviewed images and   impression as follows:     Mild coarse interstitial markings min change vs priors          Assessment

## 2019-02-05 NOTE — Patient Instructions (Signed)
Check your 02 saturations levels at peak exercise every few days and see if there's a trend - it should gradually improve   To get the most out of exercise, you need to be continuously aware that you are short of breath, but never out of breath, for 30 minutes daily. As you improve, it will actually be easier for you to do the same amount of exercise  in  30 minutes so always push to the level where you are short of breath.      Please schedule a follow up office visit in 6  weeks, sooner if needed with cxr on return

## 2019-02-05 NOTE — Assessment & Plan Note (Signed)
Body mass index is 40.01 kg/m.  -  trending up Lab Results  Component Value Date   TSH 2.22 08/01/2018     Contributing to gerd risk/ doe/reviewed the need and the process to achieve and maintain neg calorie balance > defer f/u primary care including intermittently monitoring thyroid status

## 2019-02-06 ENCOUNTER — Ambulatory Visit (HOSPITAL_COMMUNITY): Payer: 59 | Attending: Cardiovascular Disease

## 2019-02-06 ENCOUNTER — Other Ambulatory Visit: Payer: Self-pay

## 2019-02-06 DIAGNOSIS — R0602 Shortness of breath: Secondary | ICD-10-CM | POA: Diagnosis not present

## 2019-02-12 NOTE — Progress Notes (Signed)
HPI: FU CP. Carotid Dopplers 12/18 showed 1-39% bilateral stenosis. Stress echo 12/18 technically difficult but felt to likely be normal.  Patient developed COVID in June 2020.  Echocardiogram August 2020 showed normal LV function.  Since last seen,  his dyspnea on exertion is slowly improving.  He denies orthopnea, PND, pedal edema, chest pain or syncope.  Current Outpatient Medications  Medication Sig Dispense Refill  . acetaminophen (TYLENOL) 500 MG tablet Take 1,000 mg by mouth every 6 (six) hours as needed for mild pain or fever.     Marland Kitchen albuterol (VENTOLIN HFA) 108 (90 Base) MCG/ACT inhaler Inhale 2 puffs into the lungs every 6 (six) hours as needed for wheezing or shortness of breath. 18 g 0  . rosuvastatin (CRESTOR) 20 MG tablet TAKE 1 TABLET BY MOUTH EVERY DAY (Patient taking differently: Take 20 mg by mouth at bedtime. ) 90 tablet 3  . vitamin C (VITAMIN C) 500 MG tablet Take 1 tablet (500 mg total) by mouth daily. 30 tablet 0  . zinc sulfate 220 (50 Zn) MG capsule Take 1 capsule (220 mg total) by mouth daily. 30 capsule 0   No current facility-administered medications for this visit.      Past Medical History:  Diagnosis Date  . Arthritis   . Bipolar 1 disorder (HCC)    Dr Clovis Pu , pt denies  . Carotid stenosis   . Carpal tunnel syndrome    saw Dr Kristie Cowman , pt unaware  . Cervical spinal stenosis   . Colon polyps    adenomatous  . Complication of anesthesia    trouble placing catheter with last surgeery, had to get urologist  . Diverticulitis   . Diverticulosis   . Heart murmur    pt denies  . History of hiatal hernia    resolved  . Hyperlipemia   . Insomnia    related to pain  . Normal cardiac stress test    CP 01-2009---Nl ECHO and stress test neg  . Numbness of fingers of both hands    when sleeps, right hand worse than left  . PVC (premature ventricular contraction)    history of PVC noted while in [employer]  . Restless leg   . Sleep apnea    does not  use cpap due to does not like    Past Surgical History:  Procedure Laterality Date  . COLONOSCOPY WITH PROPOFOL N/A 04/02/2014   Procedure: COLONOSCOPY WITH PROPOFOL;  Surgeon: Inda Castle, MD;  Location: [facility];  Service: Endoscopy;  Laterality: N/A;  . CYSTOSCOPY WITH URETHRAL DILATATION  12/20/2011   Procedure: CYSTOSCOPY WITH URETHRAL DILATATION;  Surgeon: Earnstine Regal, MD;  Location: [facility];  Service: General;;  with foley insertion  . CYSTOSCOPY WITH URETHRAL DILATATION  12/20/2011   Procedure: CYSTOSCOPY WITH URETHRAL DILATATION;  Surgeon: Franchot Gallo, MD;  Location: [facility];  Service: Urology;;  . Consuela Mimes WITH URETHRAL DILATATION N/A 12/15/2017   Procedure: CYSTOSCOPY WITH URETHRAL DILATATION;  Surgeon: Irine Seal, MD;  Location: [facility];  Service: Urology;  Laterality: N/A;  . HOT HEMOSTASIS N/A 04/02/2014   Procedure: HOT HEMOSTASIS (ARGON PLASMA COAGULATION/BICAP);  Surgeon: Inda Castle, MD;  Location: [facility];  Service: Endoscopy;  Laterality: N/A;  . maxillofacial surgery    . PARTIAL COLECTOMY  12/20/2011   Procedure: PARTIAL COLECTOMY;  Surgeon: Earnstine Regal, MD;  Location: [facility];  Service: General;  Laterality: N/A;  Sigmoid colectomy  . right knee arthroscopy  2007 x2  . right shoulder surgery     X2  . SHOULDER SURGERY     LEFT  . TOTAL KNEE ARTHROPLASTY Left 12/15/2017   Procedure: LEFT TOTAL KNEE ARTHROPLASTY;  Surgeon: Sydnee Cabal, MD;  Location: [facility];  Service: Orthopedics;  Laterality: Left;  Adductor Block  . TOTAL KNEE ARTHROPLASTY Right 05/18/2018   Procedure: TOTAL KNEE ARTHROPLASTY;  Surgeon: Sydnee Cabal, MD;  Location: [facility];  Service: Orthopedics;  Laterality: Right;  with block    Social History   Socioeconomic History  . Marital status: Married    Spouse name: Not on file  . Number of children: 4  . Years of education: 87  . Highest education level: Not on file  Occupational History  . 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 Financial resource strain: Not on file  . Food insecurity    Worry: Not on file    Inability: Not on file  . Transportation needs    Medical: Not on file    Non-medical: Not on file  Tobacco Use  . Smoking status: Former Smoker    Types: Cigars    Quit date: 06/13/2018    Years since quitting: 0.6  . Smokeless tobacco: Former Systems developer    Types: Snuff    Quit date: 12/10/2017  . Tobacco comment: 1 per week  Substance and Sexual Activity  . Alcohol use: Yes    Comment:  a couple beers a few times a week   . Drug use: No    Comment: denies THC, cocainse, stimulants, pain meds, benzos, synthetic drugs  . Sexual activity: Never  Lifestyle  . Physical activity    Days per week: Not on file    Minutes per session: Not on file  . Stress: Not on file  Relationships  . Social Herbalist on phone: Not on file    Gets together: Not on file    Attends religious service: Not on file    Active member of club or organization: Not on file    Attends meetings of clubs or organizations: Not on file    Relationship status: Not on file  . Intimate partner violence    Fear of current or ex partner: Not on file    Emotionally abused: Not on file    Physically abused: Not on file    Forced sexual activity: Not on file  Other Topics Concern  . Not on file  Social History Narrative   Born in [place] and grew up in [place]. Currently resides in a house with his wife. 1 dog, 1 snake, 1 cat. 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    Denies religious beliefs that would effect health care.     Family History  Problem Relation Age of Onset  . Prostate cancer Paternal Grandfather   . Cancer Paternal Aunt        lymph cancer  . Alcohol abuse Father   . Alcohol abuse Sister   . Colon cancer Neg Hx   . CAD Neg Hx   . Pancreatic cancer Neg Hx   . Rectal cancer Neg Hx   . Stomach cancer Neg Hx   . Drug abuse Neg Hx   . Anxiety disorder Neg Hx    . Bipolar disorder Neg Hx   . Depression Neg Hx     ROS: no fevers or chills, productive cough, hemoptysis, dysphasia, odynophagia, melena, hematochezia, dysuria, hematuria,  rash, seizure activity, orthopnea, PND, pedal edema, claudication. Remaining systems are negative.  Physical Exam: Well-developed well-nourished in no acute distress.  Skin is warm and dry.  HEENT is normal.  Neck is supple.  Chest is clear to auscultation with normal expansion.  Cardiovascular exam is regular rate and rhythm.  Abdominal exam nontender or distended. No masses palpated. Extremities show no edema. neuro grossly intact  ECG-sinus rhythm at a rate of 93, left axis deviation, left ventricular hypertrophy, cannot rule out septal infarct.  Personally reviewed  A/P  1 carotid artery disease-mild on most recent Dopplers.  Plan to continue with aspirin and statin.  2 hyperlipidemia-continue statin.  Check lipids and liver in 8 weeks.  3 history of tobacco abuse-he has discontinued since his recent illness.  4 recent COVID infection-follow-up echocardiogram showed normal LV function.  Symptoms have resolved.  Kirk Ruths, MD

## 2019-02-13 ENCOUNTER — Ambulatory Visit: Payer: 59 | Admitting: Cardiology

## 2019-02-13 ENCOUNTER — Other Ambulatory Visit: Payer: Self-pay

## 2019-02-13 ENCOUNTER — Encounter: Payer: Self-pay | Admitting: Cardiology

## 2019-02-13 VITALS — BP 122/86 | HR 93 | Temp 97.6°F | Ht 69.0 in | Wt 268.0 lb

## 2019-02-13 DIAGNOSIS — I6523 Occlusion and stenosis of bilateral carotid arteries: Secondary | ICD-10-CM

## 2019-02-13 DIAGNOSIS — R0602 Shortness of breath: Secondary | ICD-10-CM | POA: Diagnosis not present

## 2019-02-13 DIAGNOSIS — E7800 Pure hypercholesterolemia, unspecified: Secondary | ICD-10-CM

## 2019-02-13 NOTE — Patient Instructions (Signed)
Medication Instructions:  NO CHANGE If you need a refill on your cardiac medications before your next appointment, please call your pharmacy.   Lab work: Your physician recommends that you return for lab work in: [place]  If you have labs (blood work) drawn today and your tests are completely normal, you will receive your results only by: Marland Kitchen MyChart Message (if you have MyChart) OR . A paper copy in the mail If you have any lab test that is abnormal or we need to change your treatment, we will call you to review the results.  Follow-Up: At [facility], you and your health needs are our priority.  As part of our continuing mission to provide you with exceptional heart care, we have created designated Provider Care Teams.  These Care Teams include your primary Cardiologist (physician) and Advanced Practice Providers (APPs -  Physician Assistants and Nurse Practitioners) who all work together to provide you with the care you need, when you need it. You will need a follow up appointment in 12 months.  Please call our office 2 months in advance to schedule this appointment.  You may see Kirk Ruths, MD or one of the following Advanced Practice Providers on your designated Care Team:   Kerin Ransom, PA-C Roby Lofts, [place] . Sande Rives, PA-C

## 2019-03-14 ENCOUNTER — Encounter: Payer: Self-pay | Admitting: Gastroenterology

## 2019-03-14 ENCOUNTER — Encounter: Payer: Self-pay | Admitting: Internal Medicine

## 2019-03-19 ENCOUNTER — Encounter: Payer: Self-pay | Admitting: Internal Medicine

## 2019-03-19 ENCOUNTER — Other Ambulatory Visit: Payer: Self-pay

## 2019-03-19 ENCOUNTER — Ambulatory Visit (INDEPENDENT_AMBULATORY_CARE_PROVIDER_SITE_OTHER): Payer: 59 | Admitting: Internal Medicine

## 2019-03-19 ENCOUNTER — Ambulatory Visit: Payer: 59 | Admitting: Internal Medicine

## 2019-03-19 DIAGNOSIS — F1099 Alcohol use, unspecified with unspecified alcohol-induced disorder: Secondary | ICD-10-CM

## 2019-03-19 DIAGNOSIS — U071 COVID-19: Secondary | ICD-10-CM | POA: Diagnosis not present

## 2019-03-19 NOTE — Assessment & Plan Note (Signed)
Onset of symptoms around December 07 2018 p trip to [facility] > admit 12/20/2018 to  [facility] req 02,  Given solumedrol  and remdesivir and pred taper off by end of July 2020 -  01/22/2019   Walked RA  2 laps @ approx 256ft each @ avg pace  stopped due to end of study, no desats, mild sob sats 96% at end   - 02/05/2019   [facility] RA  2 laps @  approx 240ft each @ mod fast pace  stopped due to  End of study, mild sob with sats 93%  - 03/19/2019 reporting brink walks daily sats no lower than 97% at peak levels  Also reports resolution of chest discomfort with deep breathing and no need for saba so course is c/w resolving covid pneumonitis/ ALI and f/u can be with final cxr in 3 months.  In meantime advised to check sats once a week at peak intensity.   Each maintenance medication was reviewed in detail including most importantly the difference between maintenance and as needed and under what circumstances the prns are to be used.  Please see AVS for specific  Instructions which are unique to this visit and I personally typed out  which were reviewed in detail over the phone with the patient and a copy provided via MyChart

## 2019-03-19 NOTE — Patient Instructions (Signed)
Keep up the exercise and monitor your sats at peak levels of intensity once a week to see if maintaining where they are now   Please schedule a follow up visit in 3 months but call sooner if needed with cxr on return planned  Pt has MyChart

## 2019-03-19 NOTE — Progress Notes (Signed)
Harry Carroll, male    DOB: February 17, 1961,   MRN: [id number]   Brief patient profile:  42 yowm former cigar smoker with OSA previously under care of [facility] but not seen since 2015 returned from [facility] end of June 2020  with COVID as did his wife and sister and admitted on around day 13 of illness to [place]:  Date of admission: 12/20/2018             Date of discharge:  12/26/2018    Discharge Diagnoses:  Principal Problem:   Acute respiratory failure with hypoxia ([place]) secondary to COVID-19 infection Active Problems:   BIPOLAR DISORDER UNSPECIFIED   INSOMNIA   OSA (obstructive sleep apnea)   History of colonic polyps   Obesity   COVID-19 virus infection   Depression  Activity: The patient is advised to gradually reintroduce usual activities,as tolerated .  Discharge Condition: good  Code Status: Full code  History of present illness: As per the H and P dictated on admission, "Harry Carroll a 57 y.o.malewithhistory of hyperlipidemia presents to the ER because of shortness of breath. Patient states he was diagnosed with COVID-19 about a week ago. Patient symptoms started about 2 weeks pta with fever chills productive cough diarrhea and subsequently since symptoms persisted patient had COVID-19 test done which came positive. Following which patient symptoms have greatly resolved but over the last 24 to 48 hours patient started becoming short of breath on minimal exertion. Denies any swelling of the extremities or chest pain.  ED Course:In the ER chest x-ray shows bilateral infiltrates concerning for pneumonia. Patient was febrile with temperature around 100.4 F. Labs show COVID-19 test was positive again. WBC is 8 hemoglobin 15 creatinine 1. Patient admitted for acute respiratory failure secondary to COVID pneumonia."  Hospital Course:  Summary of his active problems in the hospital is as following.  Acute respiratory failure with hypoxia  COVID 19 pneumonia   Recent Labs       Lab Results  Component Value Date   SARSCOV2NAA Detected (A) 12/11/2018     CXR: hazy bilateral peripheral opacities  Recent Labs (last 2 labs)        Recent Labs    12/24/18 0135 12/25/18 0155 12/26/18 0225  DDIMER 0.84* 1.29* 1.50*  FERRITIN 982* 849* 666*  CRP 4.5* 2.5* 1.8*     Tmax: Afebrile Oxygen requirements: On room air both at rest as well as on exertion  Antibiotics: 1 dose of IV ceftriaxone and azithromycin.  No further antibiotic. Diuretics: none Vitamin C and Zinc: Continue on discharge DVT Prophylaxis: Subcutaneous Lovenox none on discharge Remdesivir: Met criteria on admission.  Completed 5-day treatment course. Steroids: Treated with IV Solu-Medrol 40 3 times daily for 5 days.  Transitioning to oral prednisone taper. Actemra: Not used.  During this encounter: Patient Isolation: Airborne + Droplet + Contact HCP PPE: CAPR, gown. gloves Patient PPE: None  Disposition: Significant improvement in patient's inflammatory markers as well as oxygen requirement currently it is felt that the patient can safely be discharged home with oral steroids and vitamins and zinc.  Recommend outpatient follow-up with PCP. Patient's family is already tested positive for COVID test therefore does not require any specific isolation at home.  Isolation guidelines provided for the patient.  The treatment plan and use of medications and known side effects were discussed with patient/family. It was clearly explained that there is no proven definitive treatment for COVID-19 infection yet. Any medications used here are based  on case reports/anecdotal data which are not peer-reviewed and has not been studied using randomized control trials.  Complete risks and long-term side effects are unknown, however in the best clinical judgment they seem to be of some clinical benefit rather than medical risks.  Patient/family agree with the treatment plan and want to receive  these treatments as indicated.   OSA Required at night Pt was given high flow nasal cannula  Bradycardia/pauses Overnight patient was seen to have episode of bradycardia During the day patient remains asymptomatic. Appropriate augmentation of heart rate and activity.  No further work-up for now.  Depression/bipolar unspecified Patient not on medication for depression or bipolar disease. Stable  HLD Crestor 20 mg daily  Obesity Body mass index is 37.44 kg/m.   Bilateral leg edema. Patient reports edema significantly improving. Likely will remain while the patient is recovering from poor weight as well as on steroids.  No calf tenderness.  Recommend continue ambulation at home.  Patient was ambulatory without any assistance. On the day of the discharge the patient's vitals were stable, and no other acute medical condition were reported by patient. the patient was felt safe to be discharge at Home with no therapy needed on discharge.     History of Present Illness  01/22/2019  Pulmonary/ 1st office eval/[facility]  Chief Complaint  Patient presents with  . Pulmonary Consult    Referred by Dr. Jenny Reichmann for eval of PNA. Pt had Covid dxed 12/11/18.  Pt c/o SOB walking up and down stairs and has a discomfort in his chest when he takes a deep breath. He has coughing spells and coughs up white sputum. He is using his albuterol 2 x daily.    Dyspnea:  MMRC3 = can't walk 100 yards even at a slow pace at a flat grade s stopping due to sob  Since discharge with nl activity tol baseline though no aerobics Cough: on deep breath > 1-2 once of white mucus off pred x one week no worse  Sleep: off cpap x 6 m able to lie flat ok SABA use: sets off a cough  rec Check your 02 saturations levels at peak exercise every few days and see if there's a trend. To get the most out of exercise, you need to be continuously aware that you are short of breath, but never out of breath, for 30 minutes daily. As  you improve, it will actually be easier for you to do the same amount of exercise  in  30 minutes so always push to the level where you are short of breath      02/05/2019  f/u ov/[facility] re: post covid pf f/u  Chief Complaint  Patient presents with  . Follow-up    CXR repeated today.  Breathing has improved some but not back to his normal baseline. He has not needed his albuterol recently.    Dyspnea:  Walking x 1-2  min on bowflex / nothing outdoors  Cough: no cough / no meds for cough  Sleeping: not on cpap for years but plans to go back on it  SABA use: not using now 02: not using  rec Check your 02 saturations levels at peak exercise every few days and see if there's a trend - it should gradually improve   To get the most out of exercise, you need to be continuously aware that you are short of breath, but never out of breath, for 30 minutes daily. As you improve, it will actually  be easier for you to do the same amount of exercise  in  30 minutes so always push to the level where you are short of breath.      Please schedule a follow up office visit in 6  weeks, sooner if needed with cxr on return     Virtual Visit via Telephone Note 03/19/2019   I connected with Harry Carroll on 03/19/19 at  1:30 PM EDT by telephone and verified that I am speaking with the correct person using two identifiers.   I discussed the limitations, risks, security and privacy concerns of performing an evaluation and management service by telephone and the availability of in person appointments. I also discussed with the patient that there may be a patient responsible charge related to this service. The patient expressed understanding and agreed to proceed.   History of Present Illness: post covid 3 months  Dyspnea:  Walking outdoor x 30 min s stopping / slt inclines ok with sats 97% Cough: none Sleeping: no resp cc's  SABA use: none needed  02: none  Overall 95% plus, no more cps', nl echo 02/06/2019     No obvious day to day or daytime variability or assoc excess/ purulent sputum or mucus plugs or hemoptysis or cp or chest tightness, subjective wheeze or overt sinus or hb symptoms.    Also denies any obvious fluctuation of symptoms with weather or environmental changes or other aggravating or alleviating factors except as outlined above.   Meds reviewed/ med reconciliation completed     Past Medical History:  Diagnosis Date  . Arthritis   . Bipolar 1 disorder (HCC)    Dr Clovis Pu , pt denies  . Carotid stenosis   . Carpal tunnel syndrome    saw Dr Kristie Cowman , pt unaware  . Cervical spinal stenosis   . Colon polyps    adenomatous  . Complication of anesthesia    trouble placing catheter with last surgeery, had to get urologist  . Diverticulitis   . Diverticulosis   . Heart murmur    pt denies  . History of hiatal hernia    resolved  . Hyperlipemia   . Insomnia    related to pain  . Normal cardiac stress test    CP 01-2009---Nl ECHO and stress test neg  . Numbness of fingers of both hands    when sleeps, right hand worse than left  . PVC (premature ventricular contraction)    history of PVC noted while in [employer]  . Restless leg   . Sleep apnea    does not use cpap due to does not like                Observations/Objective: Sounds great, very upbeat, no wob, no cough, good phonation     Assessment and Plan: See problem list for active a/p's   Follow Up Instructions: See avs for instructions unique to this ov which includes revised/ updated med list     I discussed the assessment and treatment plan with the patient. The patient was provided an opportunity to ask questions and all were answered. The patient agreed with the plan and demonstrated an understanding of the instructions.   The patient was advised to call back or seek an in-person evaluation if the symptoms worsen or if the condition fails to improve as anticipated.  I provided 15  minutes of  non-face-to-face time during this encounter.   Christinia Gully, MD

## 2019-04-18 ENCOUNTER — Encounter: Payer: Self-pay | Admitting: Internal Medicine

## 2019-04-18 NOTE — Telephone Encounter (Signed)
Staff to contact pt -   [place] for nurse visit for Tdap

## 2019-04-19 NOTE — Telephone Encounter (Signed)
Left patient vm to call back to schedule.  °

## 2019-04-24 LAB — LIPID PANEL
Chol/HDL Ratio: 4.5 ratio (ref 0.0–5.0)
Cholesterol, Total: 215 mg/dL — ABNORMAL HIGH (ref 100–199)
HDL: 48 mg/dL (ref >39)
LDL Chol Calc (NIH): 137 mg/dL — ABNORMAL HIGH (ref 0–99)
Triglycerides: 170 mg/dL — ABNORMAL HIGH (ref 0–149)
VLDL Cholesterol Cal: 30 mg/dL (ref 5–40)

## 2019-04-24 LAB — HEPATIC FUNCTION PANEL
ALT: 131 IU/L — ABNORMAL HIGH (ref 0–44)
AST: 132 IU/L — ABNORMAL HIGH (ref 0–40)
Albumin: 4.7 g/dL (ref 3.8–4.9)
Alkaline Phosphatase: 56 IU/L (ref 39–117)
Bilirubin Total: 0.7 mg/dL (ref 0.0–1.2)
Bilirubin, Direct: 0.24 mg/dL (ref 0.00–0.40)
Total Protein: 7.1 g/dL (ref 6.0–8.5)

## 2019-04-25 ENCOUNTER — Telehealth: Payer: Self-pay | Admitting: *Deleted

## 2019-04-25 DIAGNOSIS — R7989 Other specified abnormal findings of blood chemistry: Secondary | ICD-10-CM

## 2019-04-25 DIAGNOSIS — E7800 Pure hypercholesterolemia, unspecified: Secondary | ICD-10-CM

## 2019-04-25 NOTE — Telephone Encounter (Signed)
-----   Message from Lelon Perla, MD sent at 04/24/2019  7:21 AM EST ----- DC crestor; refer to lipid clinic; may need repatha; fu LFTs 8 weeks Kirk [facility]s

## 2019-04-26 ENCOUNTER — Telehealth: Payer: Self-pay | Admitting: Cardiology

## 2019-04-26 NOTE — Telephone Encounter (Signed)
° ° ° ° °  I called pt and left a message for pt to call and schedule an appt with our Pharmacist at [facility] Per Dr Stanford Breed.

## 2019-05-20 NOTE — Telephone Encounter (Signed)
Left message with appointment time change for 05/28/19---New appointment time is 11:30 am---requested return confirmation call from patient

## 2019-05-28 ENCOUNTER — Ambulatory Visit (INDEPENDENT_AMBULATORY_CARE_PROVIDER_SITE_OTHER): Payer: 59 | Admitting: Pharmacist Clinician (PhC)/ Clinical Pharmacy Specialist

## 2019-05-28 ENCOUNTER — Other Ambulatory Visit: Payer: Self-pay

## 2019-05-28 DIAGNOSIS — E7800 Pure hypercholesterolemia, unspecified: Secondary | ICD-10-CM

## 2019-05-28 DIAGNOSIS — R7989 Other specified abnormal findings of blood chemistry: Secondary | ICD-10-CM | POA: Diagnosis not present

## 2019-05-28 NOTE — Patient Instructions (Addendum)
Your Results:             Your most recent labs Goal  Total Cholesterol 215 < 200  Triglycerides 170 < 150  HDL (happy/good cholesterol) 48 > 40  LDL (lousy/bad cholesterol 137 < 70   < 100   Medication changes:  Start Nexlizet 180/10 once daily.  If you have any problems with this please give [place] a call.  (Ladarrion Telfair/Raquel at [phone])   Lab orders:  Get you liver function tests repeated today.  Patient Assistance:  The [employer] foundation offers assistance to help pay for medication copays.  They will cover copays for all cholesterol lowering meds, including statins, fibrates, omega-3 oils, ezetimibe, Repatha, Praluent, Nexletol, Nexlizet.  The cards are usually good for $2,500 or 12 months, whichever comes first. 1. Go to healthwellfoundation.org 2. Click on "Apply Now" 3. Answer questions as to whom is applying (patient or representative) 4. Your disease fund will be "hypercholesterolemia - Medicare access" 5. They will ask questions about finances and which medications you are taking for cholesterol 6. When you submit, the approval is usually within minutes.  You will need to print the card information from the site 7. You will need to show this information to your pharmacy, they will bill your Medicare Part D plan first -then bill [employer] --for the copay.   You can also call them at [phone], although the hold times can be quite long.   Thank you for choosing [facility]

## 2019-05-28 NOTE — Progress Notes (Signed)
05/30/2019 Harry Carroll 1961/01/03 [id number]   HPI:  Harry Carroll is a 58 y.o. male patient of Dr Harry Carroll, who presents today for a lipid clinic evaluation.  See pertinent past medical history below.  Patient saw Dr. Stanford Carroll last in September, was doing well with rosuvastatin 20 mg daily, however most recent lipid panel showed an elevation in LFTs to at or above 3x UNL.  Rosuvastatin was discontinued and he is here today to discuss other options.    In July patient spent several days at [facility] and upon return was tested positive for COVID.  He ended up hospitalized for about a week and notes a slow recovery after that.  States that it was 3-4 months to get his lung capacity back.  While in the hospital he was given remdesivir.   Since recovering, he also has had surgery for carpal tunnel syndrome.  He took some oxycodone, acetaminophen and ibuprofen for several days after.     Started eating [employer] home boxed meals,  Using plant based diet   Past Medical History: Abnormal LFTs AST 132, ALT 131 (3x UNL)  ASCVD Bilateral carotid stenosis < 50%  obesity BMI today at  [facility]   COVID June 2020    Current Medications: none  Cholesterol Goals: LDL , 70   Intolerant/previously tried: rosuvastatin 20 mg caused increase in LFT's (04/2019)  Diet: has recently been trying to shift to a plant based diet, also getting a few meals a week from [employer]  Exercise:  Unable to do much during second half of year due to covid/breathing issues  Labs:  04/2019:  TC 215, TG 170, HDL 48, LDL 137  AST 132, ALT 131   Current Outpatient Medications  Medication Sig Dispense Refill  . Bempedoic Acid-Ezetimibe (NEXLIZET) 180-10 MG TABS Take 1 tablet by mouth daily. 90 tablet 3   No current facility-administered medications for this visit.    Allergies  Allergen Reactions  . Ciprofloxacin Shortness Of Breath, Nausea And Vomiting, Nausea Only, Rash and Other (See Comments)    Marked  mucous production  . Dilaudid [Hydromorphone Hcl] Shortness Of Breath, Nausea Only and Other (See Comments)    Patient developed rash, excessive mucous production and chest pain - tolerates morphine  . Hydrocodone Itching    Past Medical History:  Diagnosis Date  . Arthritis   . Bipolar 1 disorder (HCC)    Dr Harry Carroll , pt denies  . Carotid stenosis   . Carpal tunnel syndrome    saw Dr Harry Carroll , pt unaware  . Cervical spinal stenosis   . Colon polyps    adenomatous  . Complication of anesthesia    trouble placing catheter with last surgeery, had to get urologist  . Diverticulitis   . Diverticulosis   . Heart murmur    pt denies  . History of hiatal hernia    resolved  . Hyperlipemia   . Insomnia    related to pain  . Normal cardiac stress test    CP 01-2009---Nl ECHO and stress test neg  . Numbness of fingers of both hands    when sleeps, right hand worse than left  . PVC (premature ventricular contraction)    history of PVC noted while in [employer]  . Restless leg   . Sleep apnea    does not use cpap due to does not like    There were no vitals taken for this visit.   Hyperlipidemia Patient with hyperlipidemia, currently  not controlled.  Patient had to discontinue rosuvastatin 20 mg due to recent elevated liver enzymes.  Of note, when pt had COVID he was treated with remdesivir, which has been known to cause transient increases in enzyme levels.  Not sure if this is the cause, or maybe the combination of remdesivir and rosuvastatin.  For now we will have him stay off statins and monitor for return to normal enzyme levels.  Reviewed options for Repatha or Nexletol/Nexlizet and patient will start Nexlizet 180/10 mg once daily.  He will repeat labs in 3 months if no issues before then.     Tommy Medal PharmD CPP [facility] [address] [place], [place] [address] [phone]

## 2019-05-29 LAB — HEPATIC FUNCTION PANEL
ALT: 91 IU/L — ABNORMAL HIGH (ref 0–44)
AST: 52 IU/L — ABNORMAL HIGH (ref 0–40)
Albumin: 4.8 g/dL (ref 3.8–4.9)
Alkaline Phosphatase: 54 IU/L (ref 39–117)
Bilirubin Total: 0.9 mg/dL (ref 0.0–1.2)
Bilirubin, Direct: 0.23 mg/dL (ref 0.00–0.40)
Total Protein: 6.9 g/dL (ref 6.0–8.5)

## 2019-05-30 MED ORDER — NEXLIZET 180-10 MG PO TABS: 1.0000 | ORAL_TABLET | Freq: Every day | ORAL | 3 refills | Status: DC

## 2019-05-30 NOTE — Assessment & Plan Note (Signed)
Patient with hyperlipidemia, currently not controlled.  Patient had to discontinue rosuvastatin 20 mg due to recent elevated liver enzymes.  Of note, when pt had COVID he was treated with remdesivir, which has been known to cause transient increases in enzyme levels.  Not sure if this is the cause, or maybe the combination of remdesivir and rosuvastatin.  For now we will have him stay off statins and monitor for return to normal enzyme levels.  Reviewed options for Repatha or Nexletol/Nexlizet and patient will start Nexlizet 180/10 mg once daily.  He will repeat labs in 3 months if no issues before then.

## 2019-06-11 ENCOUNTER — Encounter: Payer: Self-pay | Admitting: Internal Medicine

## 2019-06-11 NOTE — Telephone Encounter (Signed)
Sent to [facility] to schedule for an appointment

## 2019-06-12 ENCOUNTER — Other Ambulatory Visit: Payer: Self-pay

## 2019-06-12 ENCOUNTER — Encounter: Payer: Self-pay | Admitting: Internal Medicine

## 2019-06-12 ENCOUNTER — Ambulatory Visit (INDEPENDENT_AMBULATORY_CARE_PROVIDER_SITE_OTHER): Payer: 59 | Admitting: Internal Medicine

## 2019-06-12 VITALS — BP 132/92 | HR 76 | Temp 99.2°F | Resp 12 | Ht 69.0 in | Wt 275.0 lb

## 2019-06-12 DIAGNOSIS — R7989 Other specified abnormal findings of blood chemistry: Secondary | ICD-10-CM | POA: Insufficient documentation

## 2019-06-12 DIAGNOSIS — R945 Abnormal results of liver function studies: Secondary | ICD-10-CM | POA: Diagnosis not present

## 2019-06-12 DIAGNOSIS — E7800 Pure hypercholesterolemia, unspecified: Secondary | ICD-10-CM

## 2019-06-12 DIAGNOSIS — F1099 Alcohol use, unspecified with unspecified alcohol-induced disorder: Secondary | ICD-10-CM | POA: Diagnosis not present

## 2019-06-12 HISTORY — DX: Other specified abnormal findings of blood chemistry: R79.89

## 2019-06-12 LAB — CBC WITH DIFFERENTIAL/PLATELET
Basophils Absolute: 0.1 10*3/uL (ref 0.0–0.1)
Basophils Relative: 0.9 % (ref 0.0–3.0)
Eosinophils Absolute: 0.2 10*3/uL (ref 0.0–0.7)
Eosinophils Relative: 2.7 % (ref 0.0–5.0)
HCT: 47.1 % (ref 39.0–52.0)
Hemoglobin: 16 g/dL (ref 13.0–17.0)
Lymphocytes Relative: 25.6 % (ref 12.0–46.0)
Lymphs Abs: 1.7 10*3/uL (ref 0.7–4.0)
MCHC: 34 g/dL (ref 30.0–36.0)
MCV: 90 fl (ref 78.0–100.0)
Monocytes Absolute: 0.4 10*3/uL (ref 0.1–1.0)
Monocytes Relative: 6.7 % (ref 3.0–12.0)
Neutro Abs: 4.3 10*3/uL (ref 1.4–7.7)
Neutrophils Relative %: 64.1 % (ref 43.0–77.0)
Platelets: 198 10*3/uL (ref 150.0–400.0)
RBC: 5.23 Mil/uL (ref 4.22–5.81)
RDW: 13.4 % (ref 11.5–15.5)
WBC: 6.6 10*3/uL (ref 4.0–10.5)

## 2019-06-12 NOTE — Patient Instructions (Signed)
Please continue all other medications as before, and refills have been done if requested.  Please have the pharmacy call with any other refills you may need.  Please continue your efforts at being more active, low cholesterol diet, and weight control.  Please keep your appointments with your specialists as you may have planned  You will be contacted regarding the referral for: abdomen ultrasound  Please go to the LAB at the blood drawing area for the tests to be done  You will be contacted by phone if any changes need to be made immediately.  Otherwise, you will receive a letter about your results with an explanation, but please check with MyChart first.  Please remember to sign up for MyChart if you have not done so, as this will be important to you in the future with finding out test results, communicating by private email, and scheduling acute appointments online when needed.  Please return in 6 months, or sooner if needed

## 2019-06-12 NOTE — Assessment & Plan Note (Signed)
stable overall by history and exam, recent data reviewed with pt, and pt to continue medical treatment as before,  to f/u any worsening symptoms or concerns  

## 2019-06-12 NOTE — Progress Notes (Signed)
Subjective:    Patient ID: Harry Carroll, male    DOB: 11-21-1960, 58 y.o.   MRN: [id number]  HPI  Here to f/u abnormal lfts, has ben higher and lower but still mild elevated x 4 mo despite start and stop anti-lipid med.  Has hx of ETOH use but states recently no more than 6 pack per wk for several months.  Denies worsening reflux, abd pain, dysphagia, n/v, bowel change or blood. Pt denies chest pain, increased sob or doe, wheezing, orthopnea, PND, increased LE swelling, palpitations, dizziness or syncope.  Pt denies new neurological symptoms such as new headache, or facial or extremity weakness or numbness   Pt denies polydipsia, polyuria.   Pt denies fever, wt loss, night sweats, loss of appetite, or other constitutional symptoms, though did have covid infection about July 2020, now finally feels over this, has final f/u with pulm Dr Melvyn Novas next month. Noted ferritin elevated with covid   Takes no other meds. Has gained several lbs with the pandemic and illness and steroid tx.Very little excerice. No prior hx of other viral hepatitis or other chronic illness to cause elevated lfts such as wilsons, etc   S/p bilat knee TKR June 2019 then dec 2019. Also s/o CTS bilateral surgury about 12 then 4 wks ago.  Had hydrocodone and duexis for a short time, but none without pain med. Wt Readings from Last 3 Encounters:  06/12/19 275 lb (124.7 kg)  02/13/19 268 lb (121.6 kg)  02/05/19 267 lb (121.1 kg)   Past Medical History:  Diagnosis Date  . Arthritis   . Bipolar 1 disorder (HCC)    Dr Clovis Pu , pt denies  . Carotid stenosis   . Carpal tunnel syndrome    saw Dr Kristie Cowman , pt unaware  . Cervical spinal stenosis   . Colon polyps    adenomatous  . Complication of anesthesia    trouble placing catheter with last surgeery, had to get urologist  . Diverticulitis   . Diverticulosis   . Heart murmur    pt denies  . History of hiatal hernia    resolved  . Hyperlipemia   . Insomnia    related to pain   . Normal cardiac stress test    CP 01-2009---Nl ECHO and stress test neg  . Numbness of fingers of both hands    when sleeps, right hand worse than left  . PVC (premature ventricular contraction)    history of PVC noted while in [employer]  . Restless leg   . Sleep apnea    does not use cpap due to does not like   Past Surgical History:  Procedure Laterality Date  . COLONOSCOPY WITH PROPOFOL N/A 04/02/2014   Procedure: COLONOSCOPY WITH PROPOFOL;  Surgeon: Inda Castle, MD;  Location: [facility];  Service: Endoscopy;  Laterality: N/A;  . CYSTOSCOPY WITH URETHRAL DILATATION  12/20/2011   Procedure: CYSTOSCOPY WITH URETHRAL DILATATION;  Surgeon: Earnstine Regal, MD;  Location: [facility];  Service: General;;  with foley insertion  . CYSTOSCOPY WITH URETHRAL DILATATION  12/20/2011   Procedure: CYSTOSCOPY WITH URETHRAL DILATATION;  Surgeon: Franchot Gallo, MD;  Location: [facility];  Service: Urology;;  . Consuela Mimes WITH URETHRAL DILATATION N/A 12/15/2017   Procedure: CYSTOSCOPY WITH URETHRAL DILATATION;  Surgeon: Irine Seal, MD;  Location: [facility];  Service: Urology;  Laterality: N/A;  . HOT HEMOSTASIS N/A 04/02/2014   Procedure: HOT HEMOSTASIS (ARGON PLASMA COAGULATION/BICAP);  Surgeon: Inda Castle, MD;  Location:  [facility];  Service: Endoscopy;  Laterality: N/A;  . maxillofacial surgery    . PARTIAL COLECTOMY  12/20/2011   Procedure: PARTIAL COLECTOMY;  Surgeon: Earnstine Regal, MD;  Location: [facility];  Service: General;  Laterality: N/A;  Sigmoid colectomy  . right knee arthroscopy     2007 x2  . right shoulder surgery     X2  . SHOULDER SURGERY     LEFT  . TOTAL KNEE ARTHROPLASTY Left 12/15/2017   Procedure: LEFT TOTAL KNEE ARTHROPLASTY;  Surgeon: Sydnee Cabal, MD;  Location: [facility];  Service: Orthopedics;  Laterality: Left;  Adductor Block  . TOTAL KNEE ARTHROPLASTY Right 05/18/2018   Procedure: TOTAL KNEE ARTHROPLASTY;  Surgeon: Sydnee Cabal, MD;  Location: [facility];  Service:  Orthopedics;  Laterality: Right;  with block    reports that he quit smoking about a year ago. His smoking use included cigars. He quit smokeless tobacco use about 18 months ago.  His smokeless tobacco use included snuff. He reports current alcohol use. He reports that he does not use drugs. family history includes Alcohol abuse in his father and sister; Cancer in his paternal aunt; Liver disease in his mother; Prostate cancer in his paternal grandfather. Allergies  Allergen Reactions  . Ciprofloxacin Shortness Of Breath, Nausea And Vomiting, Nausea Only, Rash and Other (See Comments)    Marked mucous production  . Dilaudid [Hydromorphone Hcl] Shortness Of Breath, Nausea Only and Other (See Comments)    Patient developed rash, excessive mucous production and chest pain - tolerates morphine  . Hydrocodone Itching   Current Outpatient Medications on File Prior to Visit  Medication Sig Dispense Refill  . Bempedoic Acid-Ezetimibe (NEXLIZET) 180-10 MG TABS Take 1 tablet by mouth daily. 90 tablet 3   No current facility-administered medications on file prior to visit.   Review of Systems  Constitutional: Negative for other unusual diaphoresis or sweats HENT: Negative for ear discharge or swelling Eyes: Negative for other worsening visual disturbances Respiratory: Negative for stridor or other swelling  Gastrointestinal: Negative for worsening distension or other blood Genitourinary: Negative for retention or other urinary change Musculoskeletal: Negative for other MSK pain or swelling Skin: Negative for color change or other new lesions Neurological: Negative for worsening tremors and other numbness  Psychiatric/Behavioral: Negative for worsening agitation or other fatigue All otherwise neg per pt     Objective:   Physical Exam BP (!) 132/92   Pulse 76   Temp 99.2 F (37.3 C)   Resp 12   Ht 5\' 9"  (1.753 m)   Wt 275 lb (124.7 kg)   SpO2 96%   BMI 40.61 kg/m  VS noted,   Constitutional: Pt appears in NAD HENT: Head: NCAT.  Right Ear: External ear normal.  Left Ear: External ear normal.  Eyes: . Pupils are equal, round, and reactive to light. Conjunctivae and EOM are normal Nose: without d/c or deformity Neck: Neck supple. Gross normal ROM Cardiovascular: Normal rate and regular rhythm.   Pulmonary/Chest: Effort normal and breath sounds without rales or wheezing.  Abd:  Soft, NT, ND, + BS, no organomegaly Neurological: Pt is alert. At baseline orientation, motor grossly intact Skin: Skin is warm. No rashes, other new lesions, no LE edema Psychiatric: Pt behavior is normal without agitation  All otherwise neg per pt  Lab Results  Component Value Date   WBC 4.9 01/16/2019   HGB 15.0 01/16/2019   HCT 43.6 01/16/2019   PLT 185.0 01/16/2019   GLUCOSE 116 (H)  01/16/2019   CHOL 215 (H) 04/23/2019   TRIG 170 (H) 04/23/2019   HDL 48 04/23/2019   LDLDIRECT 172.9 08/31/2012   LDLCALC 137 (H) 04/23/2019   ALT 91 (H) 05/28/2019   AST 52 (H) 05/28/2019   NA 137 01/16/2019   K 4.2 01/16/2019   CL 104 01/16/2019   CREATININE 0.88 01/16/2019   BUN 12 01/16/2019   CO2 26 01/16/2019   TSH 2.22 08/01/2018   PSA 1.26 08/01/2018   INR 0.91 05/07/2018   HGBA1C 6.3 (H) 12/25/2018        Assessment & Plan:

## 2019-06-12 NOTE — Assessment & Plan Note (Addendum)
Etiology unclear, as not obvious ETOH, medication or illicit drug use; but could be related to recent wt gain or other;  For wt loss; cont other med tx but check acute hep profile, and abd u/s, and consider GI referral if no fatty liver on imaging and lfts persist for rare causes such as wilsons PBC or PSC et al  Note:  Total time for pt hx, exam, review of record with pt in the room, determination of diagnoses and plan for further eval and tx is > 40 min, with over 50% spent in coordination and counseling of patient including the differential dx, tx, further evaluation and other management of elevated lfts, ETOH and HLD

## 2019-06-12 NOTE — Assessment & Plan Note (Signed)
Denies recent increaed use

## 2019-06-13 LAB — HEPATIC FUNCTION PANEL
ALT: 77 U/L — ABNORMAL HIGH (ref 0–53)
AST: 44 U/L — ABNORMAL HIGH (ref 0–37)
Albumin: 4.5 g/dL (ref 3.5–5.2)
Alkaline Phosphatase: 46 U/L (ref 39–117)
Bilirubin, Direct: 0.2 mg/dL (ref 0.0–0.3)
Total Bilirubin: 1.1 mg/dL (ref 0.2–1.2)
Total Protein: 6.9 g/dL (ref 6.0–8.3)

## 2019-06-13 LAB — BASIC METABOLIC PANEL
BUN: 15 mg/dL (ref 6–23)
CO2: 25 mEq/L (ref 19–32)
Calcium: 10.3 mg/dL (ref 8.4–10.5)
Chloride: 100 mEq/L (ref 96–112)
Creatinine, Ser: 1 mg/dL (ref 0.40–1.50)
GFR: 76.2 mL/min (ref >60.00)
Glucose, Bld: 88 mg/dL (ref 70–99)
Potassium: 4.3 mEq/L (ref 3.5–5.1)
Sodium: 135 mEq/L (ref 135–145)

## 2019-06-13 LAB — FERRITIN: Ferritin: 150.1 ng/mL (ref 22.0–322.0)

## 2019-06-18 ENCOUNTER — Other Ambulatory Visit: Payer: Self-pay | Admitting: *Deleted

## 2019-06-18 DIAGNOSIS — U071 COVID-19: Secondary | ICD-10-CM

## 2019-06-19 ENCOUNTER — Ambulatory Visit: Payer: 59 | Admitting: Internal Medicine

## 2019-06-19 ENCOUNTER — Other Ambulatory Visit: Payer: Self-pay

## 2019-06-19 ENCOUNTER — Encounter: Payer: Self-pay | Admitting: Internal Medicine

## 2019-06-19 ENCOUNTER — Ambulatory Visit (INDEPENDENT_AMBULATORY_CARE_PROVIDER_SITE_OTHER): Payer: 59

## 2019-06-19 DIAGNOSIS — U071 COVID-19: Secondary | ICD-10-CM

## 2019-06-19 NOTE — Patient Instructions (Signed)
Weight control is simply a matter of calorie balance which needs to be tilted in your favor by eating less and exercising more.  To get the most out of exercise, you need to be continuously aware that you are short of breath, but never out of breath, for 30 minutes daily. As you improve, it will actually be easier for you to do the same amount of exercise  in  30 minutes so always push to the level where you are short of breath.  If this does not result in gradual weight reduction then I strongly recommend you see a nutritionist with a food diary x 2 weeks so that we can work out a negative calorie balance which is universally effective in steady weight loss programs.  Think of your calorie balance like you do your bank account where in this case you want the balance to go down so you must take in less calories than you burn up.  It's just that simple:  Hard to do, but easy to understand.  Good luck!    Check your 02 sats at peak exercise about once a month, sooner if you feel you are losing ground    If you are satisfied with your treatment plan,  let your doctor know and he/she can either refill your medications or you can return here when your prescription runs out.     If in any way you are not 100% satisfied,  please tell us.  If 100% better, tell your friends!  Pulmonary follow up is as needed

## 2019-06-19 NOTE — Progress Notes (Signed)
Harry Carroll, male    DOB: 16-Aug-1960,   MRN: [id number]   Brief patient profile:  28 yowm former cigar smoker with OSA previously under care of [facility] but not seen since 2015 returned from [facility] end of June 2020  with COVID as did his wife and sister and admitted on around day 13 of illness to [place]:  Date of admission: 12/20/2018             Date of discharge:  12/26/2018    Discharge Diagnoses:  Principal Problem:   Acute respiratory failure with hypoxia ([place]) secondary to COVID-19 infection Active Problems:   BIPOLAR DISORDER UNSPECIFIED   INSOMNIA   OSA (obstructive sleep apnea)   History of colonic polyps   Obesity   COVID-19 virus infection   Depression  Activity: The patient is advised to gradually reintroduce usual activities,as tolerated .  Discharge Condition: good  Code Status: Full code  History of present illness: As per the H and P dictated on admission, "Harry Carroll a 59 y.o.malewithhistory of hyperlipidemia presents to the ER because of shortness of breath. Patient states he was diagnosed with COVID-19 about a week ago. Patient symptoms started about 2 weeks pta with fever chills productive cough diarrhea and subsequently since symptoms persisted patient had COVID-19 test done which came positive. Following which patient symptoms have greatly resolved but over the last 24 to 48 hours patient started becoming short of breath on minimal exertion. Denies any swelling of the extremities or chest pain.  ED Course:In the ER chest x-ray shows bilateral infiltrates concerning for pneumonia. Patient was febrile with temperature around 100.4 F. Labs show COVID-19 test was positive again. WBC is 8 hemoglobin 15 creatinine 1. Patient admitted for acute respiratory failure secondary to COVID pneumonia."  Hospital Course:  Summary of his active problems in the hospital is as following.  Acute respiratory failure with hypoxia  COVID 19  pneumonia  Recent Labs       Lab Results  Component Value Date   SARSCOV2NAA Detected (A) 12/11/2018     CXR: hazy bilateral peripheral opacities  Recent Labs (last 2 labs)        Recent Labs    12/24/18 0135 12/25/18 0155 12/26/18 0225  DDIMER 0.84* 1.29* 1.50*  FERRITIN 982* 849* 666*  CRP 4.5* 2.5* 1.8*     Tmax: Afebrile Oxygen requirements: On room air both at rest as well as on exertion  Antibiotics: 1 dose of IV ceftriaxone and azithromycin.  No further antibiotic. Diuretics: none Vitamin C and Zinc: Continue on discharge DVT Prophylaxis: Subcutaneous Lovenox none on discharge Remdesivir: Met criteria on admission.  Completed 5-day treatment course. Steroids: Treated with IV Solu-Medrol 40 3 times daily for 5 days.  Transitioning to oral prednisone taper. Actemra: Not used.  During this encounter: Patient Isolation: Airborne + Droplet + Contact HCP PPE: CAPR, gown. gloves Patient PPE: None  Disposition: Significant improvement in patient's inflammatory markers as well as oxygen requirement currently it is felt that the patient can safely be discharged home with oral steroids and vitamins and zinc.  Recommend outpatient follow-up with PCP. Patient's family is already tested positive for COVID test therefore does not require any specific isolation at home.  Isolation guidelines provided for the patient.  The treatment plan and use of medications and known side effects were discussed with patient/family. It was clearly explained that there is no proven definitive treatment for COVID-19 infection yet. Any medications used here are based  on case reports/anecdotal data which are not peer-reviewed and has not been studied using randomized control trials.  Complete risks and long-term side effects are unknown, however in the best clinical judgment they seem to be of some clinical benefit rather than medical risks.  Patient/family agree with the treatment plan and want  to receive these treatments as indicated.   OSA Required at night Pt was given high flow nasal cannula  Bradycardia/pauses Overnight patient was seen to have episode of bradycardia During the day patient remains asymptomatic. Appropriate augmentation of heart rate and activity.  No further work-up for now.  Depression/bipolar unspecified Patient not on medication for depression or bipolar disease. Stable  HLD Crestor 20 mg daily  Obesity Body mass index is 37.44 kg/m.   Bilateral leg edema. Patient reports edema significantly improving. Likely will remain while the patient is recovering from poor weight as well as on steroids.  No calf tenderness.  Recommend continue ambulation at home.  Patient was ambulatory without any assistance. On the day of the discharge the patient's vitals were stable, and no other acute medical condition were reported by patient. the patient was felt safe to be discharge at Home with no therapy needed on discharge.     History of Present Illness  01/22/2019  Pulmonary/ 1st office eval/Shantal Roan  Chief Complaint  Patient presents with  . Pulmonary Consult    Referred by Dr. Jenny Reichmann for eval of PNA. Pt had Covid dxed 12/11/18.  Pt c/o SOB walking up and down stairs and has a discomfort in his chest when he takes a deep breath. He has coughing spells and coughs up white sputum. He is using his albuterol 2 x daily.    Dyspnea:  MMRC3 = can't walk 100 yards even at a slow pace at a flat grade s stopping due to sob  Since discharge with nl activity tol baseline though no aerobics Cough: on deep breath > 1-2 once of white mucus off pred x one week no worse  Sleep: off cpap x 6 m able to lie flat ok SABA use: sets off a cough  rec Check your 02 saturations levels at peak exercise every few days and see if there's a trend. To get the most out of exercise, you need to be continuously aware that you are short of breath, but never out of breath, for 30 minutes  daily. As you improve, it will actually be easier for you to do the same amount of exercise  in  30 minutes so always push to the level where you are short of breath      02/05/2019  f/u ov/Kana Reimann re: post covid pf f/u  Chief Complaint  Patient presents with  . Follow-up    CXR repeated today.  Breathing has improved some but not back to his normal baseline. He has not needed his albuterol recently.    Dyspnea:  Walking x 1-2  min on bowflex / nothing outdoors  Cough: no cough / no meds for cough  Sleeping: not on cpap for years but plans to go back on it  SABA use: not using now 02: not using  rec Increase aerobic activity    06/19/2019  f/u ov/Aila Terra re: doe post covid wt  up 30 lbs  Chief Complaint  Patient presents with  . Follow-up    Breathing has improved some but not back normal baseline.   Dyspnea: bowflex x 5 min and has to stop, steps x 2 flights same Cough:  none Sleeping: flat ok  SABA use: none  02: none   No obvious day to day or daytime variability or assoc excess/ purulent sputum or mucus plugs or hemoptysis or cp or chest tightness, subjective wheeze or overt sinus or hb symptoms.   Sleeping  without nocturnal  or early am exacerbation  of respiratory  c/o's or need for noct saba. Also denies any obvious fluctuation of symptoms with weather or environmental changes or other aggravating or alleviating factors except as outlined above   No unusual exposure hx or h/o childhood pna/ asthma or knowledge of premature birth.  Current Allergies, Complete Past Medical History, Past Surgical History, Family History, and Social History were reviewed in [employer] record.  ROS  The following are not active complaints unless bolded Hoarseness, sore throat, dysphagia, dental problems, itching, sneezing,  nasal congestion or discharge of excess mucus or purulent secretions, ear ache,   fever, chills, sweats, unintended wt loss or wt gain, classically  pleuritic or exertional cp,  orthopnea pnd or arm/hand swelling  or leg swelling, presyncope, palpitations, abdominal pain, anorexia, nausea, vomiting, diarrhea  or change in bowel habits or change in bladder habits, change in stools or change in urine, dysuria, hematuria,  rash, arthralgias, visual complaints, headache, numbness, weakness or ataxia or problems with walking or coordination,  change in mood or  memory.        Current Meds  Medication Sig  . Bempedoic Acid-Ezetimibe (NEXLIZET) 180-10 MG TABS Take 1 tablet by mouth daily.             Past Medical History:  Diagnosis Date  . Arthritis   . Bipolar 1 disorder (HCC)    Dr Clovis Pu , pt denies  . Carotid stenosis   . Carpal tunnel syndrome    saw Dr Kristie Cowman , pt unaware  . Cervical spinal stenosis   . Colon polyps    adenomatous  . Complication of anesthesia    trouble placing catheter with last surgeery, had to get urologist  . Diverticulitis   . Diverticulosis   . Heart murmur    pt denies  . History of hiatal hernia    resolved  . Hyperlipemia   . Insomnia    related to pain  . Normal cardiac stress test    CP 01-2009---Nl ECHO and stress test neg  . Numbness of fingers of both hands    when sleeps, right hand worse than left  . PVC (premature ventricular contraction)    history of PVC noted while in [employer]  . Restless leg   . Sleep apnea    does not use cpap due to does not like       Objective:     amb obese pleasant wm nad   06/19/2019         273   02/05/19 267 lb (121.1 kg)  01/22/19 264 lb (119.7 kg)  12/25/18 253 lb 8.5 oz (115 kg)     Vital signs reviewed - Note on arrival 02 sats  97% on RA      HEENT : pt wearing mask not removed for exam due to covid -19 concerns.    NECK :  without JVD/Nodes/TM/ nl carotid upstrokes bilaterally   LUNGS: no acc muscle use,  Nl contour chest which is clear to A and P bilaterally without cough on insp or exp maneuvers   CV:  RRR  no s3 or murmur or  increase in P2, and no  edema   ABD:  Obese/ soft and nontender with nl inspiratory excursion in the supine position. No bruits or organomegaly appreciated, bowel sounds nl  MS:  Nl gait/ ext warm without deformities, calf tenderness, cyanosis or clubbing No obvious joint restrictions   SKIN: warm and dry without lesions    NEURO:  alert, approp, nl sensorium with  no motor or cerebellar deficits apparent.    CXR PA and Lateral:   06/19/2019 :    I personally reviewed images and agree with radiology impression as follows:   Persistent coarse interstitial lung markings with interval improvement from prior study dated 02/05/2019.      Assessment

## 2019-06-20 ENCOUNTER — Encounter: Payer: Self-pay | Admitting: Internal Medicine

## 2019-06-20 NOTE — Assessment & Plan Note (Signed)
Body mass index is 40.32 kg/m.  -  trending up since covid   Lab Results  Component Value Date   TSH 2.22 08/01/2018     Contributing to gerd risk/ doe/reviewed the need and the process to achieve and maintain neg calorie balance > defer f/u primary care including intermittently monitoring thyroid status            Each maintenance medication was reviewed in detail including emphasizing most importantly the difference between maintenance and prns and under what circumstances the prns are to be triggered using an action plan format that is not reflected in the computer generated alphabetically organized AVS which I have not found useful in most complex patients, especially with respiratory illnesses  Total time for H and P, chart review, counseling,  and generating AVS / charting =  30 min final summary f/u ov

## 2019-06-20 NOTE — Assessment & Plan Note (Addendum)
Onset of symptoms around December 07 2018 p trip to [facility] > admit 12/20/2018 to  [facility] req 02,  Given solumedrol  and remdesivir and pred taper off by end of July 2020 -  01/22/2019   Walked RA  2 laps @ approx 225ft each @ avg pace  stopped due to end of study, no desats, mild sob sats 96% at end   - 02/05/2019   [facility] RA  2 laps @  approx 25ft each @ mod fast pace  stopped due to  End of study, mild sob with sats 93%  - 03/19/2019 reporting brisk walks daily sats no lower than 97% at peak levels - 06/19/2019   Walked RA x two laps =  approx 558ft @ fast pace - stopped due to end of study/sob with sats of 93% at the end of the study.   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  Certainly there is no evidence of progressive forms of PF  Advised: Make sure you check your oxygen saturations at highest level of activity to be sure it stays over 90% and  Call if trending down   >>> pulmonary f/u is prn

## 2019-06-21 ENCOUNTER — Encounter: Payer: Self-pay | Admitting: Pharmacist Clinician (PhC)/ Clinical Pharmacy Specialist

## 2019-06-23 ENCOUNTER — Encounter: Payer: Self-pay | Admitting: Internal Medicine

## 2019-06-24 ENCOUNTER — Ambulatory Visit
Admission: RE | Admit: 2019-06-24 | Discharge: 2019-06-24 | Disposition: A | Discharge status: Home / Self Care | Payer: 59 | Source: Ambulatory Visit | Attending: Internal Medicine | Admitting: Internal Medicine

## 2019-06-26 ENCOUNTER — Other Ambulatory Visit: Payer: Self-pay

## 2019-06-26 ENCOUNTER — Ambulatory Visit (INDEPENDENT_AMBULATORY_CARE_PROVIDER_SITE_OTHER): Payer: 59

## 2019-06-26 DIAGNOSIS — Z23 Encounter for immunization: Secondary | ICD-10-CM

## 2019-06-26 DIAGNOSIS — Z299 Encounter for prophylactic measures, unspecified: Secondary | ICD-10-CM

## 2019-07-16 ENCOUNTER — Encounter: Payer: Self-pay | Admitting: Internal Medicine

## 2019-07-16 NOTE — Telephone Encounter (Signed)
Done hardcopy to Jonna Clark

## 2019-08-02 ENCOUNTER — Encounter: Payer: Self-pay | Admitting: Internal Medicine

## 2019-08-22 ENCOUNTER — Encounter: Payer: Self-pay | Admitting: Internal Medicine

## 2019-08-22 ENCOUNTER — Other Ambulatory Visit: Payer: Self-pay

## 2019-08-22 ENCOUNTER — Ambulatory Visit (INDEPENDENT_AMBULATORY_CARE_PROVIDER_SITE_OTHER): Payer: 59 | Admitting: Internal Medicine

## 2019-08-22 VITALS — BP 136/94 | HR 74 | Temp 99.2°F | Ht 69.0 in | Wt 271.4 lb

## 2019-08-22 DIAGNOSIS — E611 Iron deficiency: Secondary | ICD-10-CM

## 2019-08-22 DIAGNOSIS — R739 Hyperglycemia, unspecified: Secondary | ICD-10-CM

## 2019-08-22 DIAGNOSIS — E538 Deficiency of other specified B group vitamins: Secondary | ICD-10-CM | POA: Diagnosis not present

## 2019-08-22 DIAGNOSIS — E559 Vitamin D deficiency, unspecified: Secondary | ICD-10-CM

## 2019-08-22 DIAGNOSIS — Z23 Encounter for immunization: Secondary | ICD-10-CM

## 2019-08-22 DIAGNOSIS — Z0000 Encounter for general adult medical examination without abnormal findings: Secondary | ICD-10-CM

## 2019-08-22 LAB — VITAMIN B12: Vitamin B-12: 348 pg/mL (ref 211–911)

## 2019-08-22 LAB — BASIC METABOLIC PANEL
BUN: 17 mg/dL (ref 6–23)
CO2: 27 mEq/L (ref 19–32)
Calcium: 10.2 mg/dL (ref 8.4–10.5)
Chloride: 101 mEq/L (ref 96–112)
Creatinine, Ser: 0.99 mg/dL (ref 0.40–1.50)
GFR: 77.04 mL/min (ref >60.00)
Glucose, Bld: 84 mg/dL (ref 70–99)
Potassium: 4.3 mEq/L (ref 3.5–5.1)
Sodium: 136 mEq/L (ref 135–145)

## 2019-08-22 LAB — LIPID PANEL
Cholesterol: 171 mg/dL (ref 0–200)
HDL: 39.7 mg/dL (ref >39.00)
NonHDL: 131.41
Total CHOL/HDL Ratio: 4
Triglycerides: 212 mg/dL — ABNORMAL HIGH (ref 0.0–149.0)
VLDL: 42.4 mg/dL — ABNORMAL HIGH (ref 0.0–40.0)

## 2019-08-22 LAB — IBC PANEL
Iron: 151 ug/dL (ref 42–165)
Saturation Ratios: 40.5 % (ref 20.0–50.0)
Transferrin: 266 mg/dL (ref 212.0–360.0)

## 2019-08-22 LAB — URINALYSIS, ROUTINE W REFLEX MICROSCOPIC
Bilirubin Urine: NEGATIVE
Hgb urine dipstick: NEGATIVE
Ketones, ur: NEGATIVE
Leukocytes,Ua: NEGATIVE
Nitrite: NEGATIVE
RBC / HPF: NONE SEEN (ref 0–?)
Specific Gravity, Urine: 1.02 (ref 1.000–1.030)
Total Protein, Urine: NEGATIVE
Urine Glucose: NEGATIVE
Urobilinogen, UA: 0.2 (ref 0.0–1.0)
pH: 7 (ref 5.0–8.0)

## 2019-08-22 LAB — CBC WITH DIFFERENTIAL/PLATELET
Basophils Absolute: 0.1 10*3/uL (ref 0.0–0.1)
Basophils Relative: 1.3 % (ref 0.0–3.0)
Eosinophils Absolute: 0.1 10*3/uL (ref 0.0–0.7)
Eosinophils Relative: 1.4 % (ref 0.0–5.0)
HCT: 44.1 % (ref 39.0–52.0)
Hemoglobin: 15.3 g/dL (ref 13.0–17.0)
Lymphocytes Relative: 31.6 % (ref 12.0–46.0)
Lymphs Abs: 1.7 10*3/uL (ref 0.7–4.0)
MCHC: 34.6 g/dL (ref 30.0–36.0)
MCV: 91.5 fl (ref 78.0–100.0)
Monocytes Absolute: 0.4 10*3/uL (ref 0.1–1.0)
Monocytes Relative: 7.8 % (ref 3.0–12.0)
Neutro Abs: 3.1 10*3/uL (ref 1.4–7.7)
Neutrophils Relative %: 57.9 % (ref 43.0–77.0)
Platelets: 220 10*3/uL (ref 150.0–400.0)
RBC: 4.82 Mil/uL (ref 4.22–5.81)
RDW: 13.8 % (ref 11.5–15.5)
WBC: 5.3 10*3/uL (ref 4.0–10.5)

## 2019-08-22 LAB — VITAMIN D 25 HYDROXY (VIT D DEFICIENCY, FRACTURES): VITD: 36.92 ng/mL (ref 30.00–100.00)

## 2019-08-22 LAB — HEPATIC FUNCTION PANEL
ALT: 96 U/L — ABNORMAL HIGH (ref 0–53)
AST: 48 U/L — ABNORMAL HIGH (ref 0–37)
Albumin: 4.5 g/dL (ref 3.5–5.2)
Alkaline Phosphatase: 40 U/L (ref 39–117)
Bilirubin, Direct: 0.2 mg/dL (ref 0.0–0.3)
Total Bilirubin: 0.9 mg/dL (ref 0.2–1.2)
Total Protein: 7.2 g/dL (ref 6.0–8.3)

## 2019-08-22 LAB — PSA: PSA: 1.04 ng/mL (ref 0.10–4.00)

## 2019-08-22 LAB — HEMOGLOBIN A1C: Hgb A1c MFr Bld: 5.7 % (ref 4.6–6.5)

## 2019-08-22 LAB — TSH: TSH: 2.28 u[IU]/mL (ref 0.35–4.50)

## 2019-08-22 LAB — LDL CHOLESTEROL, DIRECT: Direct LDL: 104 mg/dL

## 2019-08-22 NOTE — Assessment & Plan Note (Signed)

## 2019-08-22 NOTE — Patient Instructions (Signed)
You had the Shingles shot #2 today  Please call the [employer] number online for an appt at the Clinic at the [facility] in [place] in 2 weeks or after, as I believe they may accept you due to age  Please continue all other medications as before, and refills have been done if requested.  Please have the pharmacy call with any other refills you may need.  Please continue your efforts at being more active, low cholesterol diet, and weight control.  You are otherwise up to date with prevention measures today.  Please keep your appointments with your specialists as you may have planned  Please go to the LAB at the blood drawing area for the tests to be done  You will be contacted by phone if any changes need to be made immediately.  Otherwise, you will receive a letter about your results with an explanation, but please check with MyChart first.  Please remember to sign up for MyChart if you have not done so, as this will be important to you in the future with finding out test results, communicating by private email, and scheduling acute appointments online when needed.  Please make an Appointment to return in 6 months, or sooner if needed

## 2019-08-22 NOTE — Assessment & Plan Note (Signed)
stable overall by history and exam, recent data reviewed with pt, and pt to continue medical treatment as before,  to f/u any worsening symptoms or concerns  

## 2019-08-22 NOTE — Progress Notes (Signed)
Subjective:    Patient ID: Harry Carroll, male    DOB: 1960-11-02, 59 y.o.   MRN: [id number]  HPI   Here for wellness and f/u;  Overall doing ok;  Pt denies Chest pain, worsening SOB, DOE, wheezing, orthopnea, PND, worsening LE edema, palpitations, dizziness or syncope.  Pt denies neurological change such as new headache, facial or extremity weakness.  Pt denies polydipsia, polyuria, or low sugar symptoms. Pt states overall good compliance with treatment and medications, good tolerability, and has been trying to follow appropriate diet.  Pt denies worsening depressive symptoms, suicidal ideation or panic. No fever, night sweats, wt loss, loss of appetite, or other constitutional symptoms.  Pt states good ability with ADL's, has low fall risk, home safety reviewed and adequate, no other significant changes in hearing or vision, and only occasionally active with exercise.  BP at home < 140/90 BP Readings from Last 3 Encounters:  08/22/19 (!) 136/94  06/19/19 112/66  06/12/19 (!) 132/92  has more energy now s/p bilat knee TKR, went on 2 hr hike recently.  Remains off the crestor due to recent elevated LFTs Past Medical History:  Diagnosis Date  . Arthritis   . Bipolar 1 disorder (HCC)    Dr Clovis Pu , pt denies  . Carotid stenosis   . Carpal tunnel syndrome    saw Dr Kristie Cowman , pt unaware  . Cervical spinal stenosis   . Colon polyps    adenomatous  . Complication of anesthesia    trouble placing catheter with last surgeery, had to get urologist  . Diverticulitis   . Diverticulosis   . Heart murmur    pt denies  . History of hiatal hernia    resolved  . Hyperlipemia   . Insomnia    related to pain  . Normal cardiac stress test    CP 01-2009---Nl ECHO and stress test neg  . Numbness of fingers of both hands    when sleeps, right hand worse than left  . PVC (premature ventricular contraction)    history of PVC noted while in [employer]  . Restless leg   . Sleep apnea    does not use  cpap due to does not like   Past Surgical History:  Procedure Laterality Date  . COLONOSCOPY WITH PROPOFOL N/A 04/02/2014   Procedure: COLONOSCOPY WITH PROPOFOL;  Surgeon: Inda Castle, MD;  Location: [facility];  Service: Endoscopy;  Laterality: N/A;  . CYSTOSCOPY WITH URETHRAL DILATATION  12/20/2011   Procedure: CYSTOSCOPY WITH URETHRAL DILATATION;  Surgeon: Earnstine Regal, MD;  Location: [facility];  Service: General;;  with foley insertion  . CYSTOSCOPY WITH URETHRAL DILATATION  12/20/2011   Procedure: CYSTOSCOPY WITH URETHRAL DILATATION;  Surgeon: Franchot Gallo, MD;  Location: [facility];  Service: Urology;;  . Consuela Mimes WITH URETHRAL DILATATION N/A 12/15/2017   Procedure: CYSTOSCOPY WITH URETHRAL DILATATION;  Surgeon: Irine Seal, MD;  Location: [facility];  Service: Urology;  Laterality: N/A;  . HOT HEMOSTASIS N/A 04/02/2014   Procedure: HOT HEMOSTASIS (ARGON PLASMA COAGULATION/BICAP);  Surgeon: Inda Castle, MD;  Location: [facility];  Service: Endoscopy;  Laterality: N/A;  . maxillofacial surgery    . PARTIAL COLECTOMY  12/20/2011   Procedure: PARTIAL COLECTOMY;  Surgeon: Earnstine Regal, MD;  Location: [facility];  Service: General;  Laterality: N/A;  Sigmoid colectomy  . right knee arthroscopy     2007 x2  . right shoulder surgery     X2  . SHOULDER SURGERY  LEFT  . TOTAL KNEE ARTHROPLASTY Left 12/15/2017   Procedure: LEFT TOTAL KNEE ARTHROPLASTY;  Surgeon: Sydnee Cabal, MD;  Location: [facility];  Service: Orthopedics;  Laterality: Left;  Adductor Block  . TOTAL KNEE ARTHROPLASTY Right 05/18/2018   Procedure: TOTAL KNEE ARTHROPLASTY;  Surgeon: Sydnee Cabal, MD;  Location: [facility];  Service: Orthopedics;  Laterality: Right;  with block    reports that he quit smoking about 14 months ago. His smoking use included cigars. He quit smokeless tobacco use about 20 months ago.  His smokeless tobacco use included snuff. He reports current alcohol use. He reports that he does not use  drugs. family history includes Alcohol abuse in his father and sister; Cancer in his paternal aunt; Liver disease in his mother; Prostate cancer in his paternal grandfather. Allergies  Allergen Reactions  . Ciprofloxacin Shortness Of Breath, Nausea And Vomiting, Nausea Only, Rash and Other (See Comments)    Marked mucous production  . Dilaudid [Hydromorphone Hcl] Shortness Of Breath, Nausea Only and Other (See Comments)    Patient developed rash, excessive mucous production and chest pain - tolerates morphine  . Hydrocodone Itching   Current Outpatient Medications on File Prior to Visit  Medication Sig Dispense Refill  . aspirin EC 81 MG tablet Take 81 mg by mouth daily.    . Bempedoic Acid-Ezetimibe (NEXLIZET) 180-10 MG TABS Take 1 tablet by mouth daily. 90 tablet 3   No current facility-administered medications on file prior to visit.   Review of Systems All otherwise neg per pt     Objective:   Physical Exam BP (!) 136/94   Pulse 74   Temp 99.2 F (37.3 C)   Ht 5\' 9"  (1.753 m)   Wt 271 lb 6.4 oz (123.1 kg)   SpO2 98%   BMI 40.08 kg/m  VS noted,  Constitutional: Pt appears in NAD HENT: Head: NCAT.  Right Ear: External ear normal.  Left Ear: External ear normal.  Eyes: . Pupils are equal, round, and reactive to light. Conjunctivae and EOM are normal Nose: without d/c or deformity Neck: Neck supple. Gross normal ROM Cardiovascular: Normal rate and regular rhythm.   Pulmonary/Chest: Effort normal and breath sounds without rales or wheezing.  Abd:  Soft, NT, ND, + BS, no organomegaly Neurological: Pt is alert. At baseline orientation, motor grossly intact Skin: Skin is warm. No rashes, other new lesions, no LE edema Psychiatric: Pt behavior is normal without agitation  All otherwise neg per pt Lab Results  Component Value Date   WBC 5.3 08/22/2019   HGB 15.3 08/22/2019   HCT 44.1 08/22/2019   PLT 220.0 08/22/2019   GLUCOSE 84 08/22/2019   CHOL 171 08/22/2019    TRIG 212.0 (H) 08/22/2019   HDL 39.70 08/22/2019   LDLDIRECT 104.0 08/22/2019   LDLCALC 137 (H) 04/23/2019   ALT 96 (H) 08/22/2019   AST 48 (H) 08/22/2019   NA 136 08/22/2019   K 4.3 08/22/2019   CL 101 08/22/2019   CREATININE 0.99 08/22/2019   BUN 17 08/22/2019   CO2 27 08/22/2019   TSH 2.28 08/22/2019   PSA 1.04 08/22/2019   INR 0.91 05/07/2018   HGBA1C 5.7 08/22/2019      Assessment & Plan:

## 2019-08-27 ENCOUNTER — Encounter: Payer: Self-pay | Admitting: Gastroenterology

## 2019-09-18 NOTE — Telephone Encounter (Signed)
This is a new issue that can't be sorted out over email but if using the "inhaler" more than twice a week" for any reason and not doing as well then >>>  Needs ov with meds in hand

## 2019-09-18 NOTE — Telephone Encounter (Signed)
Patient has been scheduled for an OV on 4/15 at 4pm with Dr. Melvyn Novas. I offered him a sooner appt on 09/20/19 at 845am but he stated that he was scheduled for a covid vaccine tomorrow and wasn't sure if he would be feeling well on Friday. He is aware to call us back if he needs a sooner appt.

## 2019-09-19 ENCOUNTER — Ambulatory Visit: Payer: 59 | Attending: Internal Medicine

## 2019-09-19 DIAGNOSIS — Z23 Encounter for immunization: Secondary | ICD-10-CM

## 2019-09-19 NOTE — Progress Notes (Signed)
   Covid-19 Vaccination Clinic  Name:  Harry Carroll    MRN: [id number] DOB: [birth year]  09/19/2019  [name] was observed post Covid-19 immunization for 30 minutes based on pre-vaccination screening without incident. He was provided with Vaccine Information Sheet and instruction to access the V-Safe system.   [name] was instructed to call 911 with any severe reactions post vaccine: Marland Kitchen Difficulty breathing  . Swelling of face and throat  . A fast heartbeat  . A bad rash all over body  . Dizziness and weakness   Immunizations Administered    Name Date Dose VIS Date Route   Pfizer COVID-19 Vaccine 09/19/2019  8:34 AM 0.3 mL 05/24/2019 Intramuscular   Manufacturer: [place]   Lot: [id number]   [place]: [id number]

## 2019-09-26 ENCOUNTER — Ambulatory Visit: Payer: 59 | Admitting: Internal Medicine

## 2019-09-26 ENCOUNTER — Encounter: Payer: Self-pay | Admitting: Internal Medicine

## 2019-09-26 ENCOUNTER — Ambulatory Visit (INDEPENDENT_AMBULATORY_CARE_PROVIDER_SITE_OTHER): Payer: 59

## 2019-09-26 ENCOUNTER — Other Ambulatory Visit: Payer: Self-pay

## 2019-09-26 DIAGNOSIS — R05 Cough: Secondary | ICD-10-CM

## 2019-09-26 DIAGNOSIS — R0600 Dyspnea, unspecified: Secondary | ICD-10-CM

## 2019-09-26 DIAGNOSIS — U071 COVID-19: Secondary | ICD-10-CM | POA: Diagnosis not present

## 2019-09-26 DIAGNOSIS — R058 Other specified cough: Secondary | ICD-10-CM

## 2019-09-26 DIAGNOSIS — R0609 Other forms of dyspnea: Secondary | ICD-10-CM

## 2019-09-26 LAB — D-DIMER, QUANTITATIVE: D-Dimer, Quant: 0.44 mcg/mL FEU (ref <0.50)

## 2019-09-26 NOTE — Progress Notes (Signed)
Harry Carroll, male    DOB: 02/21/1961,   MRN: [id number]   Brief patient profile:  73 yowm former cigar smoker with OSA previously under care of [facility] but not seen since 2015 returned from [facility] end of June 2020  with COVID as did his wife and sister and admitted on around day 13 of illness to [place]:  Date of admission: 12/20/2018             Date of discharge:  12/26/2018    Discharge Diagnoses:  Principal Problem:   Acute respiratory failure with hypoxia ([place]) secondary to COVID-19 infection Active Problems:   BIPOLAR DISORDER UNSPECIFIED   INSOMNIA   OSA (obstructive sleep apnea)   History of colonic polyps   Obesity   COVID-19 virus infection   Depression  Activity: The patient is advised to gradually reintroduce usual activities,as tolerated .  Discharge Condition: good  Code Status: Full code  History of present illness: As per the H and P dictated on admission, "Harry Carroll a 59 y.o.malewithhistory of hyperlipidemia presents to the ER because of shortness of breath. Patient states he was diagnosed with COVID-19 about a week ago. Patient symptoms started about 2 weeks pta with fever chills productive cough diarrhea and subsequently since symptoms persisted patient had COVID-19 test done which came positive. Following which patient symptoms have greatly resolved but over the last 24 to 48 hours patient started becoming short of breath on minimal exertion. Denies any swelling of the extremities or chest pain.  ED Course:In the ER chest x-ray shows bilateral infiltrates concerning for pneumonia. Patient was febrile with temperature around 100.4 F. Labs show COVID-19 test was positive again. WBC is 8 hemoglobin 15 creatinine 1. Patient admitted for acute respiratory failure secondary to COVID pneumonia."  Hospital Course:  Summary of his active problems in the hospital is as following.  Acute respiratory failure with hypoxia  COVID 19 pneumonia   Recent Labs       Lab Results  Component Value Date   SARSCOV2NAA Detected (A) 12/11/2018     CXR: hazy bilateral peripheral opacities  Recent Labs (last 2 labs)        Recent Labs    12/24/18 0135 12/25/18 0155 12/26/18 0225  DDIMER 0.84* 1.29* 1.50*  FERRITIN 982* 849* 666*  CRP 4.5* 2.5* 1.8*     Tmax: Afebrile Oxygen requirements: On room air both at rest as well as on exertion  Antibiotics: 1 dose of IV ceftriaxone and azithromycin.  No further antibiotic. Diuretics: none Vitamin C and Zinc: Continue on discharge DVT Prophylaxis: Subcutaneous Lovenox none on discharge Remdesivir: Met criteria on admission.  Completed 5-day treatment course. Steroids: Treated with IV Solu-Medrol 40 3 times daily for 5 days.  Transitioning to oral prednisone taper. Actemra: Not used.  During this encounter: Patient Isolation: Airborne + Droplet + Contact HCP PPE: CAPR, gown. gloves Patient PPE: None  Disposition: Significant improvement in patient's inflammatory markers as well as oxygen requirement currently it is felt that the patient can safely be discharged home with oral steroids and vitamins and zinc.  Recommend outpatient follow-up with PCP. Patient's family is already tested positive for COVID test therefore does not require any specific isolation at home.  Isolation guidelines provided for the patient.  The treatment plan and use of medications and known side effects were discussed with patient/family. It was clearly explained that there is no proven definitive treatment for COVID-19 infection yet. Any medications used here are based  on case reports/anecdotal data which are not peer-reviewed and has not been studied using randomized control trials.  Complete risks and long-term side effects are unknown, however in the best clinical judgment they seem to be of some clinical benefit rather than medical risks.  Patient/family agree with the treatment plan and want to  receive these treatments as indicated.   OSA Required at night Pt was given high flow nasal cannula  Bradycardia/pauses Overnight patient was seen to have episode of bradycardia During the day patient remains asymptomatic. Appropriate augmentation of heart rate and activity.  No further work-up for now.  Depression/bipolar unspecified Patient not on medication for depression or bipolar disease. Stable  HLD Crestor 20 mg daily  Obesity Body mass index is 37.44 kg/m.   Bilateral leg edema. Patient reports edema significantly improving. Likely will remain while the patient is recovering from poor weight as well as on steroids.  No calf tenderness.  Recommend continue ambulation at home.  Patient was ambulatory without any assistance. On the day of the discharge the patient's vitals were stable, and no other acute medical condition were reported by patient. the patient was felt safe to be discharge at Home with no therapy needed on discharge.     History of Present Illness  01/22/2019  Pulmonary/ 1st office eval/Harry Carroll  Chief Complaint  Patient presents with  . Pulmonary Consult    Referred by Dr. Jenny Reichmann for eval of PNA. Pt had Covid dxed 12/11/18.  Pt c/o SOB walking up and down stairs and has a discomfort in his chest when he takes a deep breath. He has coughing spells and coughs up white sputum. He is using his albuterol 2 x daily.    Dyspnea:  MMRC3 = can't walk 100 yards even at a slow pace at a flat grade s stopping due to sob  Since discharge with nl activity tol baseline though no aerobics Cough: on deep breath > 1-2 once of white mucus off pred x one week no worse  Sleep: off cpap x 6 m able to lie flat ok SABA use: sets off a cough  rec Check your 02 saturations levels at peak exercise every few days and see if there's a trend. To get the most out of exercise, you need to be continuously aware that you are short of breath, but never out of breath, for 30 minutes  daily. As you improve, it will actually be easier for you to do the same amount of exercise  in  30 minutes so always push to the level where you are short of breath      02/05/2019  f/u ov/Harry Carroll re: post covid pf f/u  Chief Complaint  Patient presents with  . Follow-up    CXR repeated today.  Breathing has improved some but not back to his normal baseline. He has not needed his albuterol recently.    Dyspnea:  Walking x 1-2  min on bowflex / nothing outdoors  Cough: no cough / no meds for cough  Sleeping: not on cpap for years but plans to go back on it  SABA use: not using now 02: not using  rec Increase aerobic activity    06/19/2019  f/u ov/Harry Carroll re: doe post covid wt  up 30 lbs  Chief Complaint  Patient presents with  . Follow-up    Breathing has improved some but not back normal baseline.   Dyspnea: bowflex x 5 min and has to stop, steps x 2 flights same Cough:  none Sleeping: flat ok  SABA use: none  02: none  rec Weight control is simply a matter of calorie balance  Check your 02 sats at peak exercise about once a month, sooner if you feel you are losing ground   09/26/2019  f/u ov/Harry Carroll re: doe post covid 19 11/2018  assoc with wt gain  Chief Complaint  Patient presents with  . Acute Visit    Increased DOE over the past few wks. He was winded walking from parking lot to our building today. Using ventolin about 3 x per day.    Dyspnea:  Worse x 3 weeks x  Across parking lot or one flight of steps  Cough: more of pnds/ tickle/ white mucus Sleeping: not using cpap SABA use: ? Helps  02: not checking    No obvious day to day or daytime variability or assoc excess/ purulent sputum or mucus plugs or hemoptysis or cp or chest tightness, subjective wheeze or overt sinus or hb symptoms.   Sleeping  without nocturnal  or early am exacerbation  of respiratory  c/o's or need for noct saba. Also denies any obvious fluctuation of symptoms with weather or environmental changes or  other aggravating or alleviating factors except as outlined above   No unusual exposure hx or h/o childhood pna/ asthma or knowledge of premature birth.  Current Allergies, Complete Past Medical History, Past Surgical History, Family History, and Social History were reviewed in [employer] record.  ROS  The following are not active complaints unless bolded Hoarseness, sore throat, dysphagia, dental problems, itching, sneezing,  nasal congestion or discharge of excess mucus or purulent secretions, ear ache,   fever, chills, sweats, unintended wt loss or wt gain, classically pleuritic or exertional cp,  orthopnea pnd or arm/hand swelling  or leg swelling, presyncope, palpitations, abdominal pain, anorexia, nausea, vomiting, diarrhea  or change in bowel habits or change in bladder habits, change in stools or change in urine, dysuria, hematuria,  rash, arthralgias, visual complaints, headache, numbness, weakness or ataxia or problems with walking or coordination,  change in mood or  memory.        Current Meds  Medication Sig  . Ascorbic Acid (VITA-C PO) Take 1 tablet by mouth daily.  Marland Kitchen aspirin EC 81 MG tablet Take 81 mg by mouth daily.  . Bempedoic Acid-Ezetimibe (NEXLIZET) 180-10 MG TABS Take 1 tablet by mouth daily.  Marland Kitchen VITAMIN D PO Take 1 tablet by mouth daily.  . Zinc Sulfate 220 (50 Zn) MG TABS Take 1 tablet by mouth daily.             Past Medical History:  Diagnosis Date  . Arthritis   . Bipolar 1 disorder (HCC)    Dr Clovis Pu , pt denies  . Carotid stenosis   . Carpal tunnel syndrome    saw Dr Kristie Cowman , pt unaware  . Cervical spinal stenosis   . Colon polyps    adenomatous  . Complication of anesthesia    trouble placing catheter with last surgeery, had to get urologist  . Diverticulitis   . Diverticulosis   . Heart murmur    pt denies  . History of hiatal hernia    resolved  . Hyperlipemia   . Insomnia    related to pain  . Normal cardiac stress  test    CP 01-2009---Nl ECHO and stress test neg  . Numbness of fingers of both hands    when sleeps, right hand worse  than left  . PVC (premature ventricular contraction)    history of PVC noted while in [employer]  . Restless leg   . Sleep apnea    does not use cpap due to does not like       Objective:    amb obese wm nad     09/26/2019       275  06/19/2019         273   02/05/19 267 lb (121.1 kg)  01/22/19 264 lb (119.7 kg)  12/25/18 253 lb 8.5 oz (115 kg)     Vital signs reviewed  09/26/2019  - Note at rest 02 sats  96% on RA      HEENT : pt wearing mask not removed for exam due to covid -19 concerns.    NECK :  without JVD/Nodes/TM/ nl carotid upstrokes bilaterally   LUNGS: no acc muscle use,  Nl contour chest which is clear to A and P bilaterally without cough on insp or exp maneuvers   CV:  RRR  no s3 or murmur or increase in P2, and no edema   ABD:  soft and nontender with nl inspiratory excursion in the supine position. No bruits or organomegaly appreciated, bowel sounds nl  MS:  Nl gait/ ext warm without deformities, calf tenderness, cyanosis or clubbing No obvious joint restrictions   SKIN: warm and dry without lesions    NEURO:  alert, approp, nl sensorium with  no motor or cerebellar deficits apparent.     CXR PA and Lateral:   09/26/2019 :    I personally reviewed images and agree with radiology impression as follows:   Lungs clear.  Cardiac silhouette normal.  Labs ordered/ reviewed:      Chemistry      Component Value Date/Time   NA 135 09/26/2019 1647   K 4.3 09/26/2019 1647   CL 102 09/26/2019 1647   CO2 24 09/26/2019 1647   BUN 20 09/26/2019 1647   CREATININE 1.14 09/26/2019 1647      Component Value Date/Time   CALCIUM 9.7 09/26/2019 1647                                  Lab Results  Component Value Date   WBC 7.0 09/26/2019   HGB 14.8 09/26/2019   HCT 41.7 09/26/2019   MCV 91.2 09/26/2019   PLT 262.0 09/26/2019        EOS                                                               0.2                                    09/26/2019   Lab Results  Component Value Date   DDIMER 0.44 09/26/2019      Lab Results  Component Value Date   TSH 2.28 08/22/2019     Lab Results  Component Value Date   PROBNP 64.0 09/26/2019                          Assessment

## 2019-09-26 NOTE — Patient Instructions (Addendum)
Try albuterol 15 min before an activity that you know would make you short of breath and see if it makes any difference and if makes none then don't take it after activity unless you can't catch your breath.  Make sure you check your oxygen saturations at highest level of activity to be sure it stays over 90%    Try prilosec otc 20mg   Take 30-60 min before first meal of the day and Pepcid ac (famotidine) 20 mg one @  bedtime until tickle  is completely gone for at least a week without the need for cough suppression  For drainage / throat tickle try take CHLORPHENIRAMINE  4 mg  (Chlortab 4mg   at [employer] should be easiest to find in the green box)  take one every 4 hours as needed - available over the counter- may cause drowsiness so start with just a bedtime dose or two and see how you tolerate it before trying in daytime    Please remember to go to the lab and x-ray department   for your tests - we will call you with the results when they are available.    Please schedule a follow up office visit in 2 weeks, sooner if needed  with all medications /inhalers/ solutions in hand so we can verify exactly what you are taking. This includes all medications from all doctors and over the counters

## 2019-09-27 LAB — BASIC METABOLIC PANEL
BUN: 20 mg/dL (ref 6–23)
CO2: 24 mEq/L (ref 19–32)
Calcium: 9.7 mg/dL (ref 8.4–10.5)
Chloride: 102 mEq/L (ref 96–112)
Creatinine, Ser: 1.14 mg/dL (ref 0.40–1.50)
GFR: 65.45 mL/min (ref >60.00)
Glucose, Bld: 77 mg/dL (ref 70–99)
Potassium: 4.3 mEq/L (ref 3.5–5.1)
Sodium: 135 mEq/L (ref 135–145)

## 2019-09-27 LAB — CBC WITH DIFFERENTIAL/PLATELET
Basophils Absolute: 0.1 10*3/uL (ref 0.0–0.1)
Basophils Relative: 0.9 % (ref 0.0–3.0)
Eosinophils Absolute: 0.2 10*3/uL (ref 0.0–0.7)
Eosinophils Relative: 2.5 % (ref 0.0–5.0)
HCT: 41.7 % (ref 39.0–52.0)
Hemoglobin: 14.8 g/dL (ref 13.0–17.0)
Lymphocytes Relative: 31.8 % (ref 12.0–46.0)
Lymphs Abs: 2.2 10*3/uL (ref 0.7–4.0)
MCHC: 35.6 g/dL (ref 30.0–36.0)
MCV: 91.2 fl (ref 78.0–100.0)
Monocytes Absolute: 0.6 10*3/uL (ref 0.1–1.0)
Monocytes Relative: 8.3 % (ref 3.0–12.0)
Neutro Abs: 3.9 10*3/uL (ref 1.4–7.7)
Neutrophils Relative %: 56.5 % (ref 43.0–77.0)
Platelets: 262 10*3/uL (ref 150.0–400.0)
RBC: 4.57 Mil/uL (ref 4.22–5.81)
RDW: 12.9 % (ref 11.5–15.5)
WBC: 7 10*3/uL (ref 4.0–10.5)

## 2019-09-27 LAB — BRAIN NATRIURETIC PEPTIDE: Pro B Natriuretic peptide (BNP): 64 pg/mL (ref 0.0–100.0)

## 2019-09-27 NOTE — Progress Notes (Signed)
Called and left message for patient to return call.  

## 2019-09-27 NOTE — Progress Notes (Signed)
Left detailed msg ok per DPR

## 2019-09-29 ENCOUNTER — Encounter: Payer: Self-pay | Admitting: Internal Medicine

## 2019-09-29 DIAGNOSIS — J3489 Other specified disorders of nose and nasal sinuses: Secondary | ICD-10-CM | POA: Insufficient documentation

## 2019-09-29 DIAGNOSIS — R058 Other specified cough: Secondary | ICD-10-CM | POA: Insufficient documentation

## 2019-09-29 DIAGNOSIS — R05 Cough: Secondary | ICD-10-CM | POA: Insufficient documentation

## 2019-09-29 NOTE — Assessment & Plan Note (Signed)
Onset spring 2021  -  rx max gerd and 1st gen H1 blockers per guidelines  09/26/2019 >>>  Upper airway cough syndrome (previously labeled PNDS),  is so named because it's frequently impossible to sort out how much is  CR/sinusitis with freq throat clearing (which can be related to primary GERD)   vs  causing  secondary (" extra esophageal")  GERD from wide swings in gastric pressure that occur with throat clearing, often  promoting self use of mint and menthol lozenges that reduce the lower esophageal sphincter tone and exacerbate the problem further in a cyclical fashion.   These are the same pts (now being labeled as having "irritable larynx syndrome" by some cough centers) who not infrequently have a history of having failed to tolerate ace inhibitors,  dry powder inhalers or biphosphonates or report having atypical/extraesophageal reflux symptoms that don't respond to standard doses of PPI  and are easily confused as having aecopd or asthma flares by even experienced allergists/ pulmonologists (myself included).   >>> f/u in 2 weeks, consider allergy eval if not improving vs trial of gabapentin

## 2019-09-29 NOTE — Assessment & Plan Note (Signed)
Body mass index is 40.67 kg/m.  -  trending up slightly  Lab Results  Component Value Date   TSH 2.28 08/22/2019     Contributing to gerd risk/ doe/reviewed the need and the process to achieve and maintain neg calorie balance > defer f/u primary care including intermittently monitoring thyroid status            Each maintenance medication was reviewed in detail including emphasizing most importantly the difference between maintenance and prns and under what circumstances the prns are to be triggered using an action plan format where appropriate.  Total time for H and P, chart review, counseling,  directly observing portions of ambulatory 02 saturation study/  and generating customized AVS unique to this office visit / charting = 30 min

## 2019-09-29 NOTE — Assessment & Plan Note (Addendum)
Onset of symptoms around December 07 2018 p trip to [facility] > admit 12/20/2018 to  [facility] req 02,  Given solumedrol  and remdesivir and pred taper off by end of July 2020 -  01/22/2019   Walked RA  2 laps @ approx 265ft each @ avg pace  stopped due to end of study, no desats, mild sob sats 96% at end   - 02/05/2019   [facility] RA  2 laps @  approx 255ft each @ mod fast pace  stopped due to  End of study, mild sob with sats 93%  - 03/19/2019 reporting brisk walks daily sats no lower than 97% at peak levels - 06/19/2019   Walked RA x two laps =  approx 577ft @ fast pace - stopped due to end of study/sob with sats of 93% at the end of the study. - 09/26/2019   Walked RA x two laps =  approx 570ft @ fast pace - stopped due to end of study/ min sob  with sats of 96 % at the end of the study.     Symptoms are markedly disproportionate to objective findings and not clear to what extent this is actually a pulmonary  problem but pt does appear to have difficult to sort out respiratory symptoms of unknown origin for which  DDX  = almost all start with A and  include Adherence, Ace Inhibitors, Acid Reflux, Active Sinus Disease, Alpha 1 Antitripsin deficiency, Anxiety masquerading as Airways dz,  ABPA,  Allergy(esp in young), Aspiration (esp in elderly), Adverse effects of meds,  Active smoking or Vaping, A bunch of PE's/clot burden (a few small clots can't cause this syndrome unless there is already severe underlying pulm or vascular dz with poor reserve),  Anemia or thyroid disorder, plus two Bs  = Bronchiectasis and Beta blocker use..and one C= CHF     Adherence is always the initial "prime suspect" and is a multilayered concern that requires a "trust but verify" approach in every patient - starting with knowing how to use medications, especially inhalers, correctly, keeping up with refills and understanding the fundamental difference between maintenance and prns vs those medications only taken for a very short course and then stopped  and not refilled.  - return in 2 weeks with all meds in hand using a trust but verify approach to confirm accurate Medication  Reconciliation The principal here is that until we are certain that the  patients are doing what we've asked, it makes no sense to ask them to do more.   ? Acid (or non-acid) GERD > always difficult to exclude as up to 75% of pts in some series report no assoc GI/ Heartburn symptoms and he continues to have sense of globus > rec max (24h)  acid suppression and diet restrictions/ reviewed and instructions given in writing.   ? Allergy / asthma very unlikely I spent extra time with pt today reviewing appropriate use of albuterol for prn use on exertion with the following points: 1) saba is for relief of sob that does not improve by walking a slower pace or resting but rather if the pt does not improve after trying this first. 2) If the pt is convinced, as many are, that saba helps recover from activity faster then it's easy to tell if this is the case by re-challenging : ie stop, take the inhaler, then p 5 minutes try the exact same activity (intensity of workload) that just caused the symptoms and see if they  are substantially diminished or not after saba 3) if there is an activity that reproducibly causes the symptoms, try the saba 15 min before the activity on alternate days   If in fact the saba really does help, then fine to continue to use it prn but advised may need to look closer at the maintenance regimen being used to achieve better control of airways disease with exertion.   ? Anxiety/depression/ deconditioning  > usually at the bottom of this list of usual suspects but may interfere with adherence and also interpretation of response or lack thereof to symptom management which can be quite subjective.   ? Anemia/ thyroid dz > excluded today  ? A bunch of PEs > D dimer nl - while a normal  or high normal value (seen commonly in the elderly or chronically ill)  may  miss small peripheral pe, the clot burden with sob is moderately high and the d dimer  has a very high neg pred value if used in this setting.    ? CHF > excluded with such a low bnp/ nl cxr    >>> f/u in 2 weeks with all meds

## 2019-09-30 NOTE — Progress Notes (Signed)
Result letter sent to patient

## 2019-10-08 ENCOUNTER — Ambulatory Visit: Payer: 59 | Admitting: Gastroenterology

## 2019-10-08 ENCOUNTER — Other Ambulatory Visit: Payer: Self-pay

## 2019-10-08 ENCOUNTER — Encounter: Payer: Self-pay | Admitting: Gastroenterology

## 2019-10-08 ENCOUNTER — Other Ambulatory Visit: Payer: 59

## 2019-10-08 VITALS — BP 120/84 | HR 88 | Temp 97.9°F | Ht 69.0 in | Wt 275.0 lb

## 2019-10-08 DIAGNOSIS — Z86010 Personal history of colonic polyps: Secondary | ICD-10-CM | POA: Diagnosis not present

## 2019-10-08 DIAGNOSIS — K760 Fatty (change of) liver, not elsewhere classified: Secondary | ICD-10-CM

## 2019-10-08 MED ORDER — SUTAB 1479-225-188 MG PO TABS: 1.0000 | ORAL_TABLET | Freq: Once | ORAL | 0 refills | Status: AC

## 2019-10-08 NOTE — Progress Notes (Signed)
HPI :  59 year old male with a history of OSA, history of Covid 30 November 2018, history of diverticulitis status post surgical resection, history of colon polyps, referred for surveillance colonoscopy and elevation in ALT by Cathlean Cower, MD.  Patient had severe diverticulitis in 2013 for which she underwent surgical resection.  Past showed diverticulitis only.  He had a follow-up colonoscopy in 2014 showing 4 polyps, largest 12 mm which was an adenoma with high-grade dysplasia.  He had a follow-up colonoscopy in 2015 in light of that pathology results showing a small/diminutive ascending colon polyp and diverticulosis.  He states he had a recurrence of diverticulitis in 2017, but no recurrence since then.  He is generally been feeling okay.  Has regular bowels, no bleeding, no abdominal pains.  Eating okay.  He had an echo in August 2020 which looked okay.  Has been followed by pulmonary following COVID-19 in June.  He has some mild dyspnea with exertion which is been stable, but he wears pulse ox and states his oxygen levels never go below 90-92% on room air.  He gets his second dose of the Covid vaccine on May 5.  Of note he has had an elevated ALT for several years, at least I can see dating back to 2014.  He drinks about 1 beer a day.  Prior serologic work-up as showed negative hep B and hep C remotely, normal iron stores.  He states his mother had cirrhosis, he was not sure from what.  He has had fatty liver noted on imaging in the past as a suspected cause of his liver enzyme elevation.  Most recent labs show ALT of 96, AST 48, alk phos 40, total bilirubin 0.9.  He has struggled to lose weight, since recovering from Covid he states he has been gaining some weight.   Endoscopic evaluation: Colonoscopy 04/02/14 - 2 mm ascending colon polyp, diverticulosis Colonoscopy 03/28/13 - 4 polyps, largest 44m - TA with HGD Colonoscopy 05/29/07 - multiple polyps removed, largest 161m Echo 02/06/19 - EF  60-65%  [id number] - gallstones, fatty liver  CT 10/21/15 - diverticulitis of descending colon   Past Medical History:  Diagnosis Date  . Arthritis   . Bipolar 1 disorder (HCC)    Dr CoClovis Pu pt denies  . Carotid stenosis   . Carpal tunnel syndrome    saw Dr ShKristie Cowman pt unaware  . Cervical spinal stenosis   . Colon polyps    adenomatous  . Complication of anesthesia    trouble placing catheter with last surgeery, had to get urologist  . Diverticulitis   . Diverticulosis   . Heart murmur    pt denies  . History of hiatal hernia    resolved  . Hyperlipemia   . Insomnia    related to pain  . Normal cardiac stress test    CP 01-2009---Nl ECHO and stress test neg  . Numbness of fingers of both hands    when sleeps, right hand worse than left  . PVC (premature ventricular contraction)    history of PVC [place] [employer] Restless leg   . Sleep apnea    does not use cpap due to does not like     Past Surgical History:  Procedure Laterality Date  . COLONOSCOPY WITH PROPOFOL N/A 04/02/2014   Procedure: COLONOSCOPY WITH PROPOFOL;  Surgeon: RoInda CastleMD;  Location: [facility];  Service: Endoscopy;  Laterality: N/A;  . CYSTOSCOPY WITH URETHRAL  DILATATION  12/20/2011   Procedure: CYSTOSCOPY WITH URETHRAL DILATATION;  Surgeon: Earnstine Regal, MD;  Location: [facility];  Service: General;;  with foley insertion  . CYSTOSCOPY WITH URETHRAL DILATATION  12/20/2011   Procedure: CYSTOSCOPY WITH URETHRAL DILATATION;  Surgeon: Franchot Gallo, MD;  Location: [facility];  Service: Urology;;  . Consuela Mimes WITH URETHRAL DILATATION N/A 12/15/2017   Procedure: CYSTOSCOPY WITH URETHRAL DILATATION;  Surgeon: Irine Seal, MD;  Location: [facility];  Service: Urology;  Laterality: N/A;  . HOT HEMOSTASIS N/A 04/02/2014   Procedure: HOT HEMOSTASIS (ARGON PLASMA COAGULATION/BICAP);  Surgeon: Inda Castle, MD;  [facility];  Service: Endoscopy;  Laterality: N/A;  . maxillofacial  surgery    . PARTIAL COLECTOMY  12/20/2011   Procedure: PARTIAL COLECTOMY;  Surgeon: Earnstine Regal, MD;  Location: [facility];  Service: General;  Laterality: N/A;  Sigmoid colectomy  . right knee arthroscopy     2007 x2  . right shoulder surgery     X2  . SHOULDER SURGERY     LEFT  . TOTAL KNEE ARTHROPLASTY Left 12/15/2017   Procedure: LEFT TOTAL KNEE ARTHROPLASTY;  Surgeon: Sydnee Cabal, MD;  Location: [facility];  Service: Orthopedics;  Laterality: Left;  Adductor Block  . TOTAL KNEE ARTHROPLASTY Right 05/18/2018   Procedure: TOTAL KNEE ARTHROPLASTY;  Surgeon: Sydnee Cabal, MD;  Location: [facility];  Service: Orthopedics;  Laterality: Right;  with block   Family History  Problem Relation Age of Onset  . Prostate cancer Paternal Grandfather   . Cancer Paternal Aunt        lymph cancer  . Liver disease Mother   . Alcohol abuse Father   . Alcohol abuse Sister   . Colon cancer Neg Hx   . CAD Neg Hx   . Pancreatic cancer Neg Hx   . Rectal cancer Neg Hx   . Stomach cancer Neg Hx   . Drug abuse Neg Hx   . Anxiety disorder Neg Hx   . Bipolar disorder Neg Hx   . Depression Neg Hx    Social History   Tobacco Use  . Smoking status: Former Smoker    Types: Cigars    Quit date: 06/13/2018    Years since quitting: 1.3  . Smokeless tobacco: Former Systems developer    Types: Snuff    Quit date: 12/10/2017  . Tobacco comment: 1 per week  Substance Use Topics  . Alcohol use: Yes    Comment:  a couple beers a few times a week   . Drug use: No    Comment: denies THC, cocainse, stimulants, pain meds, benzos, synthetic drugs   Current Outpatient Medications  Medication Sig Dispense Refill  . Ascorbic Acid (VITA-C PO) Take 1 tablet by mouth daily.    Marland Kitchen aspirin EC 81 MG tablet Take 81 mg by mouth daily.    . Bempedoic Acid-Ezetimibe (NEXLIZET) 180-10 MG TABS Take 1 tablet by mouth daily. 90 tablet 3  . VITAMIN D PO Take 1 tablet by mouth daily.    . Zinc Sulfate 220 (50 Zn) MG TABS Take 1 tablet by mouth  daily.     No current facility-administered medications for this visit.   Allergies  Allergen Reactions  . Ciprofloxacin Shortness Of Breath, Nausea And Vomiting, Nausea Only, Rash and Other (See Comments)    Marked mucous production  . Dilaudid [Hydromorphone Hcl] Shortness Of Breath, Nausea Only and Other (See Comments)    Patient developed rash, excessive mucous production and chest  pain - tolerates morphine  . Hydrocodone Itching     Review of Systems: All systems reviewed and negative except where noted in HPI.    DG Chest 2 View  Result Date: 09/27/2019 CLINICAL DATA:  Shortness of breath EXAM: CHEST - 2 VIEW COMPARISON:  June 19, 2019 FINDINGS: Lungs are clear. Heart size and pulmonary vascularity are normal. No adenopathy. There is degenerative change in the thoracic spine. IMPRESSION: Lungs clear.  Cardiac silhouette normal. Electronically Signed   By: Lowella Grip III M.D.   On: 09/27/2019 09:51   Lab Results  Component Value Date   WBC 7.0 09/26/2019   HGB 14.8 09/26/2019   HCT 41.7 09/26/2019   MCV 91.2 09/26/2019   PLT 262.0 09/26/2019    Lab Results  Component Value Date   CREATININE 1.14 09/26/2019   BUN 20 09/26/2019   NA 135 09/26/2019   K 4.3 09/26/2019   CL 102 09/26/2019   CO2 24 09/26/2019    Lab Results  Component Value Date   ALT 96 (H) 08/22/2019   AST 48 (H) 08/22/2019   ALKPHOS 40 08/22/2019   BILITOT 0.9 08/22/2019      Physical Exam: BP 120/84   Pulse 88   Temp 97.9 F (36.6 C)   Ht 5' 9" (1.753 m)   Wt 275 lb (124.7 kg)   BMI 40.61 kg/m  Constitutional: Pleasant,well-developed, male in no acute distress. HEENT: Normocephalic and atraumatic. Conjunctivae are normal. No scleral icterus. Neck supple.  Cardiovascular: Normal rate, regular rhythm.  Pulmonary/chest: Effort normal and breath sounds normal. No wheezing, rales or rhonchi. Abdominal: Soft, nondistended, nontender.  There are no masses palpable. Extremities:  no edema Lymphadenopathy: No cervical adenopathy noted. Neurological: Alert and oriented to person place and time. Skin: Skin is warm and dry. No rashes noted. Psychiatric: Normal mood and affect. Behavior is normal.   ASSESSMENT AND PLAN: 59 year old male here for reassessment the following:  History of colon polyps - history of advanced adenoma with high-grade dysplasia in 2014, last colonoscopy in 2015 without any significant pathology.  He is due for surveillance colonoscopy.  I discussed risks and benefits of colonoscopy and anesthesia with him and he wanted to proceed.  Last echocardiogram looks okay, pulmonary status seems stable, he does not have any oxygen requirement.  I think stable for the [place].  This will be scheduled in late May after he has completed his COVID-19 vaccination series.  All questions answered he agreed with the plan  Fatty liver / morbid obesity (BMI > 40) -patient with chronic ALT elevation for years, mostly less than 100.  He does drink 1 alcoholic beverage a day however while I do think he should cut back I do not think this is the primary driving of his liver disease.  His weight as we discussed as the driver of his fatty liver more than likely.  We discussed what fatty liver is, risks for Karlene Lineman and cirrhosis.  I do not see any evidence of cirrhosis at this time but is certainly at risk for it given the chronicity of the findings.  We discussed importance of weight loss and he will try to work on this.  He has not seen a weight loss clinic but will refer him for that if he is interested if he is not successful on his own.  He has been struggling with weight more so since his Covid disease and hopefully as he is more active as he continues to recover.  I will complete his serologic work-up to ensure no evidence of chronic liver disease otherwise on labs, and vaccinated to have a and B if needed.  I advised him we should see each other at least once yearly for his fatty  liver.  He agreed  Holton Cellar, MD [facility]

## 2019-10-08 NOTE — Patient Instructions (Addendum)
If you are age 61 or older, your body mass index should be between 23-30. Your Body mass index is 40.61 kg/m. If this is out of the aforementioned range listed, please consider follow up with your Primary Care Provider.  If you are age 52 or younger, your body mass index should be between 19-25. Your Body mass index is 40.61 kg/m. If this is out of the aformentioned range listed, please consider follow up with your Primary Care Provider.   You have been scheduled for a colonoscopy. Please follow written instructions given to you at your visit today.  Please pick up your prep supplies at the pharmacy within the next 1-3 days. If you use inhalers (even only as needed), please bring them with you on the day of your procedure. Your physician has requested that you go to www.startemmi.com and enter the access code given to you at your visit today. This web site gives a general overview about your procedure. However, you should still follow specific instructions given to you by our office regarding your preparation for the procedure.  PLEASE BE SURE TO INSTRUCT YOUR PHARMACY TO RUN THE CODES WE SENT WITH YOUR SUTAB PRESCRIPTION.  Please go to the lab in the basement of our building to have lab work done as you leave today. Hit "B" for basement when you get on the elevator.  When the doors open the lab is on your left.  We will call you with the results. Thank you.  Due to recent changes in healthcare laws, you may see the results of your imaging and laboratory studies on MyChart before your provider has had a chance to review them.  We understand that in some cases there may be results that are confusing or concerning to you. Not all laboratory results come back in the same time frame and the provider may be waiting for multiple results in order to interpret others.  Please give [place] 48 hours in order for your provider to thoroughly review all the results before contacting the office for clarification of your  results.   Thank you for entrusting me with your care and for choosing [facility], Dr. Bethlehem Cellar

## 2019-10-09 ENCOUNTER — Other Ambulatory Visit: Payer: Self-pay

## 2019-10-09 MED ORDER — NEXLIZET 180-10 MG PO TABS: 1.0000 | ORAL_TABLET | Freq: Every day | ORAL | 3 refills | Status: DC

## 2019-10-14 ENCOUNTER — Other Ambulatory Visit: Payer: Self-pay

## 2019-10-14 DIAGNOSIS — Z23 Encounter for immunization: Secondary | ICD-10-CM

## 2019-10-14 LAB — ANA: Anti Nuclear Antibody (ANA): NEGATIVE

## 2019-10-14 LAB — HEPATITIS C ANTIBODY
Hepatitis C Ab: NONREACTIVE
SIGNAL TO CUT-OFF: 0.01 (ref <1.00)

## 2019-10-14 LAB — ANTI-SMOOTH MUSCLE ANTIBODY, IGG: Actin (Smooth Muscle) Antibody (IGG): <20 U (ref <20)

## 2019-10-14 LAB — HEPATITIS B SURFACE ANTIGEN: Hepatitis B Surface Ag: NONREACTIVE

## 2019-10-14 LAB — ALPHA-1-ANTITRYPSIN: A-1 Antitrypsin, Ser: 146 mg/dL (ref 83–199)

## 2019-10-14 LAB — HEPATITIS B SURFACE ANTIBODY,QUALITATIVE: Hep B S Ab: NONREACTIVE

## 2019-10-14 LAB — HEPATITIS A ANTIBODY, TOTAL: Hepatitis A AB,Total: NONREACTIVE

## 2019-10-14 LAB — IGG: IgG (Immunoglobin G), Serum: 1025 mg/dL (ref 600–1640)

## 2019-10-14 NOTE — Progress Notes (Signed)
Left message for pt that he needs Twinrix series.  Needs to be scheduled for nurse visit and then another one for 2nd injection 30 days + later. Order is in

## 2019-10-15 ENCOUNTER — Ambulatory Visit: Payer: 59 | Attending: Internal Medicine

## 2019-10-15 DIAGNOSIS — Z23 Encounter for immunization: Secondary | ICD-10-CM

## 2019-10-15 NOTE — Progress Notes (Signed)
   Covid-19 Vaccination Clinic  Name:  Harry Carroll    MRN: [id number] DOB: 01/01/1961  10/15/2019  Harry Carroll was observed post Covid-19 immunization for 15 minutes without incident. He was provided with Vaccine Information Sheet and instruction to access the V-Safe system.   Harry Carroll was instructed to call 911 with any severe reactions post vaccine: Marland Kitchen Difficulty breathing  . Swelling of face and throat  . A fast heartbeat  . A bad rash all over body  . Dizziness and weakness   Immunizations Administered    Name Date Dose VIS Date Route   Pfizer COVID-19 Vaccine 10/15/2019  8:11 AM 0.3 mL 08/07/2018 Intramuscular   Manufacturer: [employer]   Lot: [id number]   [place]: [id number]

## 2019-10-23 ENCOUNTER — Encounter: Payer: Self-pay | Admitting: Internal Medicine

## 2019-10-23 ENCOUNTER — Ambulatory Visit: Payer: 59 | Admitting: Internal Medicine

## 2019-10-23 ENCOUNTER — Other Ambulatory Visit: Payer: Self-pay

## 2019-10-23 ENCOUNTER — Encounter: Payer: Self-pay | Admitting: Gastroenterology

## 2019-10-23 DIAGNOSIS — U071 COVID-19: Secondary | ICD-10-CM

## 2019-10-23 MED ORDER — ALBUTEROL SULFATE HFA 108 (90 BASE) MCG/ACT IN AERS
2.0000 | INHALATION_SPRAY | Freq: Four times a day (QID) | RESPIRATORY_TRACT | 0 refills | Status: DC | PRN
Start: 2019-10-23 — End: 2022-10-13

## 2019-10-23 NOTE — Patient Instructions (Signed)
No change in recommendations   If you are satisfied with your treatment plan,  let your doctor know and he/she can either refill your medications or you can return here when your prescription runs out.     If in any way you are not 100% satisfied,  please tell us.  If 100% better, tell your friends!  Pulmonary follow up is as needed   

## 2019-10-23 NOTE — Progress Notes (Signed)
Harry Carroll, male    DOB: 16-Aug-1960,   MRN: [id number]   Brief patient profile:  28 yowm former cigar smoker with OSA previously under care of Harry Carroll but not seen since 2015 returned from [facility] end of June 2020  with COVID as did his wife and sister and admitted on around day 13 of illness to [place]:  Date of admission: 12/20/2018             Date of discharge:  12/26/2018    Discharge Diagnoses:  Principal Problem:   Acute respiratory failure with hypoxia ([place]) secondary to COVID-19 infection Active Problems:   BIPOLAR DISORDER UNSPECIFIED   INSOMNIA   OSA (obstructive sleep apnea)   History of colonic polyps   Obesity   COVID-19 virus infection   Depression  Activity: The patient is advised to gradually reintroduce usual activities,as tolerated .  Discharge Condition: good  Code Status: Full code  History of present illness: As per the H and P dictated on admission, "Harry Carroll a 59 y.o.malewithhistory of hyperlipidemia presents to the ER because of shortness of breath. Patient states he was diagnosed with COVID-19 about a week ago. Patient symptoms started about 2 weeks pta with fever chills productive cough diarrhea and subsequently since symptoms persisted patient had COVID-19 test done which came positive. Following which patient symptoms have greatly resolved but over the last 24 to 48 hours patient started becoming short of breath on minimal exertion. Denies any swelling of the extremities or chest pain.  ED Course:In the ER chest x-ray shows bilateral infiltrates concerning for pneumonia. Patient was febrile with temperature around 100.4 F. Labs show COVID-19 test was positive again. WBC is 8 hemoglobin 15 creatinine 1. Patient admitted for acute respiratory failure secondary to COVID pneumonia."  Hospital Course:  Summary of his active problems in the hospital is as following.  Acute respiratory failure with hypoxia  COVID 19  pneumonia  Recent Labs       Lab Results  Component Value Date   SARSCOV2NAA Detected (A) 12/11/2018     CXR: hazy bilateral peripheral opacities  Recent Labs (last 2 labs)        Recent Labs    12/24/18 0135 12/25/18 0155 12/26/18 0225  DDIMER 0.84* 1.29* 1.50*  FERRITIN 982* 849* 666*  CRP 4.5* 2.5* 1.8*     Tmax: Afebrile Oxygen requirements: On room air both at rest as well as on exertion  Antibiotics: 1 dose of IV ceftriaxone and azithromycin.  No further antibiotic. Diuretics: none Vitamin C and Zinc: Continue on discharge DVT Prophylaxis: Subcutaneous Lovenox none on discharge Remdesivir: Met criteria on admission.  Completed 5-day treatment course. Steroids: Treated with IV Solu-Medrol 40 3 times daily for 5 days.  Transitioning to oral prednisone taper. Actemra: Not used.  During this encounter: Patient Isolation: Airborne + Droplet + Contact HCP PPE: CAPR, gown. gloves Patient PPE: None  Disposition: Significant improvement in patient's inflammatory markers as well as oxygen requirement currently it is felt that the patient can safely be discharged home with oral steroids and vitamins and zinc.  Recommend outpatient follow-up with PCP. Patient's family is already tested positive for COVID test therefore does not require any specific isolation at home.  Isolation guidelines provided for the patient.  The treatment plan and use of medications and known side effects were discussed with patient/family. It was clearly explained that there is no proven definitive treatment for COVID-19 infection yet. Any medications used here are based  on case reports/anecdotal data which are not peer-reviewed and has not been studied using randomized control trials.  Complete risks and long-term side effects are unknown, however in the best clinical judgment they seem to be of some clinical benefit rather than medical risks.  Patient/family agree with the treatment plan and want  to receive these treatments as indicated.   OSA Required at night Pt was given high flow nasal cannula  Bradycardia/pauses Overnight patient was seen to have episode of bradycardia During the day patient remains asymptomatic. Appropriate augmentation of heart rate and activity.  No further work-up for now.  Depression/bipolar unspecified Patient not on medication for depression or bipolar disease. Stable  HLD Crestor 20 mg daily  Obesity Body mass index is 37.44 kg/m.   Bilateral leg edema. Patient reports edema significantly improving. Likely will remain while the patient is recovering from poor weight as well as on steroids.  No calf tenderness.  Recommend continue ambulation at home.  Patient was ambulatory without any assistance. On the day of the discharge the patient's vitals were stable, and no other acute medical condition were reported by patient. the patient was felt safe to be discharge at Home with no therapy needed on discharge.     History of Present Illness  01/22/2019  Pulmonary/ 1st office eval/Harry Carroll  Chief Complaint  Patient presents with  . Pulmonary Consult    Referred by Dr. Jenny Carroll for eval of PNA. Pt had Covid dxed 12/11/18.  Pt c/o SOB walking up and down stairs and has a discomfort in his chest when he takes a deep breath. He has coughing spells and coughs up white sputum. He is using his albuterol 2 x daily.    Dyspnea:  MMRC3 = can't walk 100 yards even at a slow pace at a flat grade s stopping due to sob  Since discharge with nl activity tol baseline though no aerobics Cough: on deep breath > 1-2 once of white mucus off pred x one week no worse  Sleep: off cpap x 6 m able to lie flat ok SABA use: sets off a cough  rec Check your 02 saturations levels at peak exercise every few days and see if there's a trend. To get the most out of exercise, you need to be continuously aware that you are short of breath, but never out of breath, for 30 minutes  daily. As you improve, it will actually be easier for you to do the same amount of exercise  in  30 minutes so always push to the level where you are short of breath      02/05/2019  f/u ov/Harry Carroll re: post covid pf f/u  Chief Complaint  Patient presents with  . Follow-up    CXR repeated today.  Breathing has improved some but not back to his normal baseline. He has not needed his albuterol recently.    Dyspnea:  Walking x 1-2  min on bowflex / nothing outdoors  Cough: no cough / no meds for cough  Sleeping: not on cpap for years but plans to go back on it  SABA use: not using now 02: not using  rec Increase aerobic activity    06/19/2019  f/u ov/Briah Nary re: doe post covid wt  up 30 lbs  Chief Complaint  Patient presents with  . Follow-up    Breathing has improved some but not back normal baseline.   Dyspnea: bowflex x 5 min and has to stop, steps x 2 flights same Cough:  none Sleeping: flat ok  SABA use: none  02: none  rec Weight control is simply a matter of calorie balance  Check your 02 sats at peak exercise about once a month, sooner if you feel you are losing ground   09/26/2019  f/u ov/Maelle Sheaffer re: doe post covid 19 11/2018  assoc with wt gain  Chief Complaint  Patient presents with  . Acute Visit    Increased DOE over the past few wks. He was winded walking from parking lot to our building today. Using ventolin about 3 x per day.    Dyspnea:  Worse x 3 weeks x  Across parking lot or one flight of steps  Cough: more of pnds/ tickle/ white mucus Sleeping: not using cpap SABA use: ? Helps  02: not checking  rec Try albuterol 15 min before an activity that you know would make you short of breath and see if it makes any difference and if makes none then don't take it after activity unless you can't catch your breath. Make sure you check your oxygen saturations at highest level of activity to be sure it stays over 90%   Try prilosec otc 54m  Take 30-60 min before first meal of the  day and Pepcid ac (famotidine) 20 mg one @  bedtime until tickle  is completely gone for at least a week without the need for cough suppression For drainage / throat tickle try take CHLORPHENIRAMINE  4 mg  (Chlortab 4103m at [employer] be easiest to find in the green box)  take one every 4 hours as needed Please schedule a follow up office visit in 2 weeks, sooner if needed  with all medications /inhalers/ solutions in hand so we can verify exactly what you are taking. This includes all medications from all doctors and over the counters    10/23/2019  f/u ov/Rodger Giangregorio re: doe post covid 19 11/2018  assoc with wt gain  Chief Complaint  Patient presents with  . Follow-up    feeling better, less SOB   Dyspnea:  Much better, bowflex tol ok  Cough: none Sleeping: not using cpap/ on side bed is flat one pillow - denies resp symptoms or daytime drowsiness  SABA use: not convinced saba helping 02: none/ sats running 93% with activity     No obvious day to day or daytime variability or assoc excess/ purulent sputum or mucus plugs or hemoptysis or cp or chest tightness, subjective wheeze or overt sinus or hb symptoms.   Sleeping  without nocturnal  or early am exacerbation  of respiratory  c/o's or need for noct saba. Also denies any obvious fluctuation of symptoms with weather or environmental changes or other aggravating or alleviating factors except as outlined above   No unusual exposure hx or h/o childhood pna/ asthma or knowledge of premature birth.  Current Allergies, Complete Past Medical History, Past Surgical History, Family History, and Social History were reviewed in [employer].  ROS  The following are not active complaints unless bolded Hoarseness, sore throat, dysphagia, dental problems, itching, sneezing,  nasal congestion or discharge of excess mucus or purulent secretions, ear ache,   fever, chills, sweats, unintended wt loss or wt gain,  classically pleuritic or exertional cp,  orthopnea pnd or arm/hand swelling  or leg swelling, presyncope, palpitations, abdominal pain, anorexia, nausea, vomiting, diarrhea  or change in bowel habits or change in bladder habits, change in stools or change in urine, dysuria, hematuria,  rash, arthralgias, visual complaints, headache, numbness, weakness or ataxia or problems with walking or coordination,  change in mood or  memory.        Current Meds  Medication Sig  . Ascorbic Acid (VITA-C PO) Take 1 tablet by mouth daily.  Marland Kitchen aspirin EC 81 MG tablet Take 81 mg by mouth daily.  . Bempedoic Acid-Ezetimibe (NEXLIZET) 180-10 MG TABS Take 1 tablet by mouth daily.  Marland Kitchen VITAMIN D PO Take 1 tablet by mouth daily.  . Zinc Sulfate 220 (50 Zn) MG TABS Take 1 tablet by mouth daily.                 Past Medical History:  Diagnosis Date  . Arthritis   . Bipolar 1 disorder (HCC)    Dr Clovis Pu , pt denies  . Carotid stenosis   . Carpal tunnel syndrome    saw Dr Kristie Cowman , pt unaware  . Cervical spinal stenosis   . Colon polyps    adenomatous  . Complication of anesthesia    trouble placing catheter with last surgeery, had to get urologist  . Diverticulitis   . Diverticulosis   . Heart murmur    pt denies  . History of hiatal hernia    resolved  . Hyperlipemia   . Insomnia    related to pain  . Normal cardiac stress test    CP 01-2009---Nl ECHO and stress test neg  . Numbness of fingers of both hands    when sleeps, right hand worse than left  . PVC (premature ventricular contraction)    history of PVC noted while in [employer]  . Restless leg   . Sleep apnea    does not use cpap due to does not like       Objective:          10/23/2019       271  09/26/2019       275  06/19/2019         273   02/05/19 267 lb (121.1 kg)  01/22/19 264 lb (119.7 kg)  12/25/18 253 lb 8.5 oz (115 kg)    amb pleasant obese wm   Vital signs reviewed  10/23/2019  - Note at rest 02 sats  97% on RA       HEENT : pt wearing mask not removed for exam due to covid -19 concerns.    NECK :  without JVD/Nodes/TM/ nl carotid upstrokes bilaterally   LUNGS: no acc muscle use,  Nl contour chest which is clear to A and P bilaterally without cough on insp or exp maneuvers   CV:  RRR  no s3 or murmur or increase in P2, and no edema   ABD:  soft and nontender with nl inspiratory excursion in the supine position. No bruits or organomegaly appreciated, bowel sounds nl  MS:  Nl gait/ ext warm without deformities, calf tenderness, cyanosis or clubbing No obvious joint restrictions   SKIN: warm and dry without lesions    NEURO:  alert, approp, nl sensorium with  no motor or cerebellar deficits apparent.                                Assessment

## 2019-10-24 ENCOUNTER — Encounter: Payer: Self-pay | Admitting: Internal Medicine

## 2019-10-24 NOTE — Assessment & Plan Note (Addendum)
Onset of symptoms around December 07 2018 p trip to [facility] > admit 12/20/2018 to  [facility] req 02,  Given solumedrol  and remdesivir and pred taper off by end of July 2020 -  01/22/2019   Walked RA  2 laps @ approx 241ft each @ avg pace  stopped due to end of study, no desats, mild sob sats 96% at end   - 02/05/2019   [facility] RA  2 laps @  approx 255ft each @ mod fast pace  stopped due to  End of study, mild sob with sats 93%  - 03/19/2019 reporting brisk walks daily sats no lower than 97% at peak levels - 06/19/2019   Walked RA x two laps =  approx 565ft @ fast pace - stopped due to end of study/sob with sats of 93% at the end of the study. - 09/26/2019   Walked RA x two laps =  approx 59ft @ fast pace - stopped due to end of study/ min sob  with sats of 96 % at the end of the study.     Improving ex tol/ sats as expected with reg exercise so no additonal studies needed at this point  Pulmonary f/u can be as needed          Each maintenance medication was reviewed in detail including emphasizing most importantly the difference between maintenance and prns and under what circumstances the prns are to be triggered using an action plan format where appropriate.  Total time for H and P, chart review, counseling, teaching device and generating customized AVS unique to this office visit / charting = 20 min

## 2019-10-30 ENCOUNTER — Encounter: Payer: Self-pay | Admitting: Internal Medicine

## 2019-10-30 NOTE — Telephone Encounter (Signed)
Actually since allergies and post nasal gtt are incredibly common this time of year and ENT copays are not cheap, I would suggest a 1 week trial otc allegra and nasacort every day , then call if not improved for ent referral  I can refer to ent if this is not acceptable, but I would ask that he try this if this is ok

## 2019-10-31 ENCOUNTER — Ambulatory Visit (INDEPENDENT_AMBULATORY_CARE_PROVIDER_SITE_OTHER): Payer: 59 | Admitting: Gastroenterology

## 2019-10-31 DIAGNOSIS — Z23 Encounter for immunization: Secondary | ICD-10-CM

## 2019-11-06 ENCOUNTER — Encounter: Payer: 59 | Admitting: Gastroenterology

## 2019-11-26 ENCOUNTER — Other Ambulatory Visit: Payer: Self-pay | Admitting: Gastroenterology

## 2019-11-26 ENCOUNTER — Other Ambulatory Visit: Payer: Self-pay

## 2019-11-26 ENCOUNTER — Encounter: Payer: Self-pay | Admitting: Gastroenterology

## 2019-11-26 ENCOUNTER — Ambulatory Visit (AMBULATORY_SURGERY_CENTER): Payer: 59 | Admitting: Gastroenterology

## 2019-11-26 VITALS — BP 135/77 | HR 71 | Temp 98.6°F | Resp 16 | Ht 69.0 in | Wt 275.0 lb

## 2019-11-26 DIAGNOSIS — D122 Benign neoplasm of ascending colon: Secondary | ICD-10-CM

## 2019-11-26 DIAGNOSIS — Z86010 Personal history of colonic polyps: Secondary | ICD-10-CM | POA: Diagnosis not present

## 2019-11-26 DIAGNOSIS — D123 Benign neoplasm of transverse colon: Secondary | ICD-10-CM | POA: Diagnosis not present

## 2019-11-26 DIAGNOSIS — D120 Benign neoplasm of cecum: Secondary | ICD-10-CM | POA: Diagnosis not present

## 2019-11-26 MED ORDER — SODIUM CHLORIDE 0.9 % IV SOLN: 500.0000 mL | Freq: Once | INTRAVENOUS | Status: DC

## 2019-11-26 NOTE — Patient Instructions (Signed)
Handouts given for polyps, diverticulosis and hemorrhoids.  Await pathology results.  YOU HAD AN ENDOSCOPIC PROCEDURE TODAY AT THE [facility]:   Refer to the procedure report that was given to you for any specific questions about what was found during the examination.  If the procedure report does not answer your questions, please call your gastroenterologist to clarify.  If you requested that your care partner not be given the details of your procedure findings, then the procedure report has been included in a sealed envelope for you to review at your convenience later.  YOU SHOULD EXPECT: Some feelings of bloating in the abdomen. Passage of more gas than usual.  Walking can help get rid of the air that was put into your GI tract during the procedure and reduce the bloating. If you had a lower endoscopy (such as a colonoscopy or flexible sigmoidoscopy) you may notice spotting of blood in your stool or on the toilet paper. If you underwent a bowel prep for your procedure, you may not have a normal bowel movement for a few days.  Please Note:  You might notice some irritation and congestion in your nose or some drainage.  This is from the oxygen used during your procedure.  There is no need for concern and it should clear up in a day or so.  SYMPTOMS TO REPORT IMMEDIATELY:   Following lower endoscopy (colonoscopy or flexible sigmoidoscopy):  Excessive amounts of blood in the stool  Significant tenderness or worsening of abdominal pains  Swelling of the abdomen that is new, acute  Fever of 100F or higher   For urgent or emergent issues, a gastroenterologist can be reached at any hour by calling [phone]. Do not use MyChart messaging for urgent concerns.    DIET:  We do recommend a small meal at first, but then you may proceed to your regular diet.  Drink plenty of fluids but you should avoid alcoholic beverages for 24 hours.  ACTIVITY:  You should plan to take it easy for  the rest of today and you should NOT DRIVE or use heavy machinery until tomorrow (because of the sedation medicines used during the test).    FOLLOW UP: Our staff will call the number listed on your records 48-72 hours following your procedure to check on you and address any questions or concerns that you may have regarding the information given to you following your procedure. If we do not reach you, we will leave a message.  We will attempt to reach you two times.  During this call, we will ask if you have developed any symptoms of COVID 19. If you develop any symptoms (ie: fever, flu-like symptoms, shortness of breath, cough etc.) before then, please call [phone].  If you test positive for Covid 19 in the 2 weeks post procedure, please call and report this information to us.    If any biopsies were taken you will be contacted by phone or by letter within the next 1-3 weeks.  Please call us at [phone] if you have not heard about the biopsies in 3 weeks.    SIGNATURES/CONFIDENTIALITY: You and/or your care partner have signed paperwork which will be entered into your electronic medical record.  These signatures attest to the fact that that the information above on your After Visit Summary has been reviewed and is understood.  Full responsibility of the confidentiality of this discharge information lies with you and/or your care-partner. 

## 2019-11-26 NOTE — Op Note (Signed)
[place] Patient Name: Harry Carroll Procedure Date: 11/26/2019 3:58 PM MRN: [id number] Endoscopist: Remo Lipps P. Harry Carroll , MD Age: 59 Referring MD:  Date of Birth: Jul 11, 1960 Gender: Male Account #: [id number] Procedure:                Colonoscopy Indications:              High risk colon cancer surveillance: Personal                            history of colonic polyps (TA with HGD in 2014) Medicines:                Monitored Anesthesia Care Procedure:                Pre-Anesthesia Assessment:                           - Prior to the procedure, a History and Physical                            was performed, and patient medications and                            allergies were reviewed. The patient's tolerance of                            previous anesthesia was also reviewed. The risks                            and benefits of the procedure and the sedation                            options and risks were discussed with the patient.                            All questions were answered, and informed consent                            was obtained. Prior Anticoagulants: The patient has                            taken no previous anticoagulant or antiplatelet                            agents. ASA Grade Assessment: III - A patient with                            severe systemic disease. After reviewing the risks                            and benefits, the patient was deemed in                            satisfactory condition to undergo the procedure.  After obtaining informed consent, the colonoscope                            was passed under direct vision. Throughout the                            procedure, the patient's blood pressure, pulse, and                            oxygen saturations were monitored continuously. The                            Colonoscope was introduced through the anus and                            advanced to  the the cecum, identified by                            appendiceal orifice and ileocecal valve. The                            colonoscopy was performed without difficulty. The                            patient tolerated the procedure well. The quality                            of the bowel preparation was adequate. The                            ileocecal valve, appendiceal orifice, and rectum                            were photographed. Scope In: 4:07:45 PM Scope Out: 4:24:52 PM Scope Withdrawal Time: 0 hours 10 minutes 40 seconds  Total Procedure Duration: 0 hours 17 minutes 7 seconds  Findings:                 The perianal and digital rectal examinations were                            normal.                           Two sessile polyps were found in the cecum. The                            polyps were 4 to 5 mm in size. These polyps were                            removed with a cold snare. Resection and retrieval                            were complete.  A 4 mm polyp was found in the ascending colon. The                            polyp was sessile. The polyp was removed with a                            cold snare. Resection and retrieval were complete.                           Three sessile polyps were found in the hepatic                            flexure. The polyps were 4 to 5 mm in size. These                            polyps were removed with a cold snare. Resection                            and retrieval were complete.                           Multiple medium-mouthed diverticula were found in                            the sigmoid colon, and a few in the hepatic flexure.                           Internal hemorrhoids were found during retroflexion.                           The exam was otherwise without abnormality. Complications:            No immediate complications. Estimated blood loss:                            Minimal. Estimated  Blood Loss:     Estimated blood loss was minimal. Impression:               - Two 4 to 5 mm polyps in the cecum, removed with a                            cold snare. Resected and retrieved.                           - One 4 mm polyp in the ascending colon, removed                            with a cold snare. Resected and retrieved.                           - Three 4 to 5 mm polyps at the hepatic flexure,  removed with a cold snare. Resected and retrieved.                           - Diverticulosis in the sigmoid colon and hepatic                            flexure.                           - Internal hemorrhoids.                           - The examination was otherwise normal. Recommendation:           - Patient has a contact number available for                            emergencies. The signs and symptoms of potential                            delayed complications were discussed with the                            patient. Return to normal activities tomorrow.                            Written discharge instructions were provided to the                            patient.                           - Resume previous diet.                           - Continue present medications.                           - Await pathology results. Remo Lipps P. [name], MD 11/26/2019 4:30:34 PM This report has been signed electronically.

## 2019-11-26 NOTE — Progress Notes (Signed)
A and O x3. Report to RN. Tolerated MAC anesthesia well.

## 2019-11-26 NOTE — Progress Notes (Signed)
Pt's states no medical or surgical changes since previsit or office visit. 

## 2019-11-26 NOTE — Progress Notes (Signed)
Called to room to assist during endoscopic procedure.  Patient ID and intended procedure confirmed with present staff. Received instructions for my participation in the procedure from the performing physician.  

## 2019-11-28 ENCOUNTER — Telehealth: Payer: Self-pay

## 2019-11-28 ENCOUNTER — Telehealth: Payer: Self-pay | Admitting: *Deleted

## 2019-11-28 NOTE — Telephone Encounter (Signed)
Second post procedure follow up call, no answer 

## 2019-11-28 NOTE — Telephone Encounter (Signed)
  Follow up Call-  Call back number 11/26/2019  Post procedure Call Back phone  # [phone]  Permission to leave phone message Yes  Some recent data might be hidden     Patient questions:  Message left to call us if necessary.

## 2019-12-09 ENCOUNTER — Other Ambulatory Visit: Payer: Self-pay

## 2019-12-09 ENCOUNTER — Ambulatory Visit: Payer: 59 | Admitting: Internal Medicine

## 2019-12-09 ENCOUNTER — Ambulatory Visit (INDEPENDENT_AMBULATORY_CARE_PROVIDER_SITE_OTHER): Payer: 59 | Admitting: Gastroenterology

## 2019-12-09 ENCOUNTER — Encounter: Payer: Self-pay | Admitting: Internal Medicine

## 2019-12-09 ENCOUNTER — Ambulatory Visit (INDEPENDENT_AMBULATORY_CARE_PROVIDER_SITE_OTHER): Payer: 59

## 2019-12-09 VITALS — BP 142/88 | HR 86 | Temp 99.1°F | Ht 69.0 in | Wt 278.0 lb

## 2019-12-09 DIAGNOSIS — Z23 Encounter for immunization: Secondary | ICD-10-CM

## 2019-12-09 DIAGNOSIS — E785 Hyperlipidemia, unspecified: Secondary | ICD-10-CM

## 2019-12-09 DIAGNOSIS — R739 Hyperglycemia, unspecified: Secondary | ICD-10-CM

## 2019-12-09 DIAGNOSIS — F320 Major depressive disorder, single episode, mild: Secondary | ICD-10-CM | POA: Diagnosis not present

## 2019-12-09 DIAGNOSIS — Z0000 Encounter for general adult medical examination without abnormal findings: Secondary | ICD-10-CM

## 2019-12-09 DIAGNOSIS — R0789 Other chest pain: Secondary | ICD-10-CM

## 2019-12-09 NOTE — Patient Instructions (Signed)
Please continue all other medications as before, and refills have been done if requested.  Please have the pharmacy call with any other refills you may need.  Please continue your efforts at being more active, low cholesterol diet, and weight control.  Please keep your appointments with your specialists as you may have planned  Please go to the XRAY Department in the first floor for the x-ray testing  Please make an Appointment to return in Jan 2022, or sooner if needed, also with Lab Appointment for testing done 3-5 days before at the [facility] (so this is for TWO appointments - please see the scheduling desk as you leave)

## 2019-12-09 NOTE — Progress Notes (Signed)
Subjective:    Patient ID: Harry Carroll, male    DOB: [birth year], 59 y.o.   MRN: [id number]  HPI  Here to f/u; overall doing ok,  Pt denies increasing sob or doe, wheezing, orthopnea, PND, increased LE swelling, palpitations, dizziness or syncope, but does have right lateral CP with sneeze fit about 10 days ago with pain persistent and grinding.  Pt denies new neurological symptoms such as new headache, or facial or extremity weakness or numbness.  Pt denies polydipsia, polyuria, or low sugar episode.  Pt states overall good compliance with meds Gained wt with recent vacation after wt loss.  Wt Readings from Last 3 Encounters:  12/09/19 278 lb (126.1 kg)  11/26/19 275 lb (124.7 kg)  10/23/19 271 lb 12.8 oz (123.3 kg)  Denies worsening depressive symptoms, suicidal ideation, or panic; Past Medical History:  Diagnosis Date  . Arthritis   . Bipolar 1 disorder (HCC)    Dr Clovis Pu , pt denies  . Carotid stenosis   . Carpal tunnel syndrome    saw Dr Kristie Cowman , pt unaware  . Cervical spinal stenosis   . Colon polyps    adenomatous  . Complication of anesthesia    trouble placing catheter with last surgeery, had to get urologist  . Diverticulitis   . Diverticulosis   . Heart murmur    pt denies  . History of hiatal hernia    resolved  . Hyperlipemia   . Insomnia    related to pain  . Normal cardiac stress test    CP 01-2009---Nl ECHO and stress test neg  . Numbness of fingers of both hands    when sleeps, right hand worse than left  . PVC (premature ventricular contraction)    history of PVC noted while in [employer]  . Restless leg   . Sleep apnea    does not use cpap due to does not like   Past Surgical History:  Procedure Laterality Date  . COLONOSCOPY WITH PROPOFOL N/A 04/02/2014   Procedure: COLONOSCOPY WITH PROPOFOL;  Surgeon: Inda Castle, MD;  Location: [facility];  Service: Endoscopy;  Laterality: N/A;  . CYSTOSCOPY WITH URETHRAL DILATATION  12/20/2011   Procedure:  CYSTOSCOPY WITH URETHRAL DILATATION;  Surgeon: Earnstine Regal, MD;  Location: [facility];  Service: General;;  with foley insertion  . CYSTOSCOPY WITH URETHRAL DILATATION  12/20/2011   Procedure: CYSTOSCOPY WITH URETHRAL DILATATION;  Surgeon: Franchot Gallo, MD;  Location: [facility];  Service: Urology;;  . Consuela Mimes WITH URETHRAL DILATATION N/A 12/15/2017   Procedure: CYSTOSCOPY WITH URETHRAL DILATATION;  Surgeon: Irine Seal, MD;  Location: [facility];  Service: Urology;  Laterality: N/A;  . HOT HEMOSTASIS N/A 04/02/2014   Procedure: HOT HEMOSTASIS (ARGON PLASMA COAGULATION/BICAP);  Surgeon: Inda Castle, MD;  Location: [facility];  Service: Endoscopy;  Laterality: N/A;  . maxillofacial surgery    . PARTIAL COLECTOMY  12/20/2011   Procedure: PARTIAL COLECTOMY;  Surgeon: Earnstine Regal, MD;  Location: [facility];  Service: General;  Laterality: N/A;  Sigmoid colectomy  . right knee arthroscopy     2007 x2  . right shoulder surgery     X2  . SHOULDER SURGERY     LEFT  . TOTAL KNEE ARTHROPLASTY Left 12/15/2017   Procedure: LEFT TOTAL KNEE ARTHROPLASTY;  Surgeon: Sydnee Cabal, MD;  Location: [facility];  Service: Orthopedics;  Laterality: Left;  Adductor Block  . TOTAL KNEE ARTHROPLASTY Right 05/18/2018   Procedure: TOTAL KNEE ARTHROPLASTY;  Surgeon: Sydnee Cabal, MD;  Location: [facility];  Service: Orthopedics;  Laterality: Right;  with block    reports that he quit smoking about 17 months ago. His smoking use included cigars. He quit smokeless tobacco use about 2 years ago.  His smokeless tobacco use included snuff. He reports current alcohol use. He reports that he does not use drugs. family history includes Alcohol abuse in his father and sister; Cancer in his paternal aunt; Liver disease in his mother; Prostate cancer in his paternal grandfather. Allergies  Allergen Reactions  . Ciprofloxacin Shortness Of Breath, Nausea And Vomiting, Nausea Only, Rash and Other (See Comments)    Marked mucous production    . Dilaudid [Hydromorphone Hcl] Shortness Of Breath, Nausea Only and Other (See Comments)    Patient developed rash, excessive mucous production and chest pain - tolerates morphine  . Hydrocodone Itching   Current Outpatient Medications on File Prior to Visit  Medication Sig Dispense Refill  . albuterol (VENTOLIN HFA) 108 (90 Base) MCG/ACT inhaler Inhale 2 puffs into the lungs every 6 (six) hours as needed for wheezing or shortness of breath. 8 g 0  . Ascorbic Acid (VITA-C PO) Take 1 tablet by mouth daily.    Marland Kitchen aspirin EC 81 MG tablet Take 81 mg by mouth daily.    . Bempedoic Acid-Ezetimibe (NEXLIZET) 180-10 MG TABS Take 1 tablet by mouth daily. 90 tablet 3  . VITAMIN D PO Take 1 tablet by mouth daily.    . Zinc Sulfate 220 (50 Zn) MG TABS Take 1 tablet by mouth daily.     No current facility-administered medications on file prior to visit.   Review of Systems All otherwise neg per pt     Objective:   Physical Exam BP (!) 142/88 (BP Location: Left Arm, Patient Position: Sitting, Cuff Size: Large)   Pulse 86   Temp 99.1 F (37.3 C) (Oral)   Ht 5\' 9"  (1.753 m)   Wt 278 lb (126.1 kg)   SpO2 98%   BMI 41.05 kg/m  VS noted,  Constitutional: Pt appears in NAD HENT: Head: NCAT.  Right Ear: External ear normal.  Left Ear: External ear normal.  Eyes: . Pupils are equal, round, and reactive to light. Conjunctivae and EOM are normal Nose: without d/c or deformity Neck: Neck supple. Gross normal ROM Cardiovascular: Normal rate and regular rhythm.   Pulmonary/Chest: Effort normal and breath sounds without rales or wheezing.  Abd:  Soft, NT, ND, + BS, no organomegaly Neurological: Pt is alert. At baseline orientation, motor grossly intact Skin: Skin is warm. No rashes, other new lesions, no LE edema Psychiatric: Pt behavior is normal without agitation  All otherwise neg per pt Lab Results  Component Value Date   WBC 7.0 09/26/2019   HGB 14.8 09/26/2019   HCT 41.7 09/26/2019    PLT 262.0 09/26/2019   GLUCOSE 77 09/26/2019   CHOL 171 08/22/2019   TRIG 212.0 (H) 08/22/2019   HDL 39.70 08/22/2019   LDLDIRECT 104.0 08/22/2019   LDLCALC 137 (H) 04/23/2019   ALT 96 (H) 08/22/2019   AST 48 (H) 08/22/2019   NA 135 09/26/2019   K 4.3 09/26/2019   CL 102 09/26/2019   CREATININE 1.14 09/26/2019   BUN 20 09/26/2019   CO2 24 09/26/2019   TSH 2.28 08/22/2019   PSA 1.04 08/22/2019   INR 0.91 05/07/2018   HGBA1C 5.7 08/22/2019         Assessment & Plan:

## 2019-12-10 ENCOUNTER — Encounter: Payer: Self-pay | Admitting: Internal Medicine

## 2019-12-10 NOTE — Assessment & Plan Note (Signed)
.  stsable

## 2019-12-10 NOTE — Assessment & Plan Note (Signed)
stable overall by history and exam, recent data reviewed with pt, and pt to continue medical treatment as before,  to f/u any worsening symptoms or concerns le  

## 2019-12-10 NOTE — Assessment & Plan Note (Addendum)
Right lateral, c/w prob msk strain but cant r/o fx, for cxr, rib films, pain control  I spent 31 minutes in preparing to see the patient by review of recent labs, imaging and procedures, obtaining and reviewing separately obtained history, communicating with the patient and family or caregiver, ordering medications, tests or procedures, and documenting clinical information in the EHR including the differential Dx, treatment, and any further evaluation and other management of cp, depresison, hld, hyperglycemia

## 2019-12-10 NOTE — Assessment & Plan Note (Signed)
stable overall by history and exam, recent data reviewed with pt, and pt to continue medical treatment as before,  to f/u any worsening symptoms or concerns  

## 2020-01-17 ENCOUNTER — Other Ambulatory Visit: Payer: Self-pay

## 2020-01-17 ENCOUNTER — Encounter: Payer: Self-pay | Admitting: Internal Medicine

## 2020-01-17 ENCOUNTER — Ambulatory Visit: Payer: 59 | Admitting: Internal Medicine

## 2020-01-17 VITALS — BP 140/82 | HR 93 | Temp 98.5°F | Ht 69.0 in | Wt 280.0 lb

## 2020-01-17 DIAGNOSIS — R0789 Other chest pain: Secondary | ICD-10-CM | POA: Diagnosis not present

## 2020-01-17 DIAGNOSIS — F320 Major depressive disorder, single episode, mild: Secondary | ICD-10-CM

## 2020-01-17 DIAGNOSIS — R739 Hyperglycemia, unspecified: Secondary | ICD-10-CM

## 2020-01-17 DIAGNOSIS — R1011 Right upper quadrant pain: Secondary | ICD-10-CM

## 2020-01-17 NOTE — Patient Instructions (Signed)
Your exam seems ok today, though it may take longer if you have torn muscle to heal  Please call if you would want to try gabapentin for possible nerve pain?, or even to check the gallbladder again with a repeat ultrasound and blood tests  Please continue all other medications as before, and refills have been done if requested.  Please have the pharmacy call with any other refills you may need.  Please keep your appointments with your specialists as you may have planned

## 2020-01-17 NOTE — Progress Notes (Signed)
Subjective:    Patient ID: Harry Carroll, male    DOB: 04-20-1961, 59 y.o.   MRN: [id number]  HPI  Here to f/u at the behest of wife concerned about tender area to the right lower anterolateral rib cage now 7 wks after twisting and hearing a pop or crack in the area; recent rib films neg.  Also has known gallstones.  Pain not really worse with eating but does radiate somewhat to the back, mild intermittent sharp and not really better or worse with anything else. No fever.  Denies worsening reflux, other abd pain, dysphagia, n/v, bowel change or blood, Pt denies other chest pain, increased sob or doe, wheezing, orthopnea, PND, increased LE swelling, palpitations, dizziness or syncope.  Pt denies polydipsia, polyuria,  Denies worsening depressive symptoms, suicidal ideation, or panic Past Medical History:  Diagnosis Date  . Arthritis   . Bipolar 1 disorder (HCC)    Dr Clovis Pu , pt denies  . Carotid stenosis   . Carpal tunnel syndrome    saw Dr Kristie Cowman , pt unaware  . Cervical spinal stenosis   . Colon polyps    adenomatous  . Complication of anesthesia    trouble placing catheter with last surgeery, had to get urologist  . Diverticulitis   . Diverticulosis   . Heart murmur    pt denies  . History of hiatal hernia    resolved  . Hyperlipemia   . Insomnia    related to pain  . Normal cardiac stress test    CP 01-2009---Nl ECHO and stress test neg  . Numbness of fingers of both hands    when sleeps, right hand worse than left  . PVC (premature ventricular contraction)    history of PVC noted while in [employer]  . Restless leg   . Sleep apnea    does not use cpap due to does not like   Past Surgical History:  Procedure Laterality Date  . COLONOSCOPY WITH PROPOFOL N/A 04/02/2014   Procedure: COLONOSCOPY WITH PROPOFOL;  Surgeon: Inda Castle, MD;  Location: [facility];  Service: Endoscopy;  Laterality: N/A;  . CYSTOSCOPY WITH URETHRAL DILATATION  12/20/2011   Procedure:  CYSTOSCOPY WITH URETHRAL DILATATION;  Surgeon: Earnstine Regal, MD;  Location: [facility];  Service: General;;  with foley insertion  . CYSTOSCOPY WITH URETHRAL DILATATION  12/20/2011   Procedure: CYSTOSCOPY WITH URETHRAL DILATATION;  Surgeon: Franchot Gallo, MD;  Location: [facility];  Service: Urology;;  . Consuela Mimes WITH URETHRAL DILATATION N/A 12/15/2017   Procedure: CYSTOSCOPY WITH URETHRAL DILATATION;  Surgeon: Irine Seal, MD;  Location: [facility];  Service: Urology;  Laterality: N/A;  . HOT HEMOSTASIS N/A 04/02/2014   Procedure: HOT HEMOSTASIS (ARGON PLASMA COAGULATION/BICAP);  Surgeon: Inda Castle, MD;  Location: [facility];  Service: Endoscopy;  Laterality: N/A;  . maxillofacial surgery    . PARTIAL COLECTOMY  12/20/2011   Procedure: PARTIAL COLECTOMY;  Surgeon: Earnstine Regal, MD;  Location: [facility];  Service: General;  Laterality: N/A;  Sigmoid colectomy  . right knee arthroscopy     2007 x2  . right shoulder surgery     X2  . SHOULDER SURGERY     LEFT  . TOTAL KNEE ARTHROPLASTY Left 12/15/2017   Procedure: LEFT TOTAL KNEE ARTHROPLASTY;  Surgeon: Sydnee Cabal, MD;  Location: [facility];  Service: Orthopedics;  Laterality: Left;  Adductor Block  . TOTAL KNEE ARTHROPLASTY Right 05/18/2018   Procedure: TOTAL KNEE ARTHROPLASTY;  Surgeon: Sydnee Cabal,  MD;  Location: [facility];  Service: Orthopedics;  Laterality: Right;  with block    reports that he quit smoking about 19 months ago. His smoking use included cigars. He quit smokeless tobacco use about 2 years ago.  His smokeless tobacco use included snuff. He reports current alcohol use. He reports that he does not use drugs. family history includes Alcohol abuse in his father and sister; Cancer in his paternal aunt; Liver disease in his mother; Prostate cancer in his paternal grandfather. Allergies  Allergen Reactions  . Ciprofloxacin Shortness Of Breath, Nausea And Vomiting, Nausea Only, Rash and Other (See Comments)    Marked mucous production    . Dilaudid [Hydromorphone Hcl] Shortness Of Breath, Nausea Only and Other (See Comments)    Patient developed rash, excessive mucous production and chest pain - tolerates morphine  . Hydrocodone Itching   Current Outpatient Medications on File Prior to Visit  Medication Sig Dispense Refill  . albuterol (VENTOLIN HFA) 108 (90 Base) MCG/ACT inhaler Inhale 2 puffs into the lungs every 6 (six) hours as needed for wheezing or shortness of breath. 8 g 0  . Ascorbic Acid (VITA-C PO) Take 1 tablet by mouth daily.    Marland Kitchen aspirin EC 81 MG tablet Take 81 mg by mouth daily.    . Bempedoic Acid-Ezetimibe (NEXLIZET) 180-10 MG TABS Take 1 tablet by mouth daily. 90 tablet 3  . VITAMIN D PO Take 1 tablet by mouth daily.    . Zinc Sulfate 220 (50 Zn) MG TABS Take 1 tablet by mouth daily.     No current facility-administered medications on file prior to visit.   Review of Systems All otherwise neg per pt=    Objective:   Physical Exam BP 140/82 (BP Location: Left Arm, Patient Position: Sitting, Cuff Size: Large)   Pulse 93   Temp 98.5 F (36.9 C) (Oral)   Ht 5\' 9"  (1.753 m)   Wt 280 lb (127 kg)   SpO2 94%   BMI 41.35 kg/m  VS noted,  Constitutional: Pt appears in NAD HENT: Head: NCAT.  Right Ear: External ear normal.  Left Ear: External ear normal.  Eyes: . Pupils are equal, round, and reactive to light. Conjunctivae and EOM are normal Nose: without d/c or deformity Neck: Neck supple. Gross normal ROM Cardiovascular: Normal rate and regular rhythm.   Pulmonary/Chest: Effort normal and breath sounds without rales or wheezing.  Abd:  Soft, NT, ND, + BS, no organomegaly but has tender area to right lower anterolateral rib cage about t10 without swelling or rash Neurological: Pt is alert. At baseline orientation, motor grossly intact Skin: Skin is warm. No rashes, other new lesions, no LE edema Psychiatric: Pt behavior is normal without agitation  All otherwise neg per pt Lab Results   Component Value Date   WBC 7.0 09/26/2019   HGB 14.8 09/26/2019   HCT 41.7 09/26/2019   PLT 262.0 09/26/2019   GLUCOSE 77 09/26/2019   CHOL 171 08/22/2019   TRIG 212.0 (H) 08/22/2019   HDL 39.70 08/22/2019   LDLDIRECT 104.0 08/22/2019   LDLCALC 137 (H) 04/23/2019   ALT 96 (H) 08/22/2019   AST 48 (H) 08/22/2019   NA 135 09/26/2019   K 4.3 09/26/2019   CL 102 09/26/2019   CREATININE 1.14 09/26/2019   BUN 20 09/26/2019   CO2 24 09/26/2019   TSH 2.28 08/22/2019   PSA 1.04 08/22/2019   INR 0.91 05/07/2018   HGBA1C 5.7 08/22/2019  Assessment & Plan:

## 2020-01-26 ENCOUNTER — Encounter: Payer: Self-pay | Admitting: Internal Medicine

## 2020-01-26 NOTE — Assessment & Plan Note (Signed)
stable overall by history and exam, recent data reviewed with pt, and pt to continue medical treatment as before,  to f/u any worsening symptoms or concerns  

## 2020-01-26 NOTE — Assessment & Plan Note (Addendum)
I suspect muscular tear with persistent pain, very low suspicion for cardiac or pulmonary, cant r/o gB but symptoms mild and at this time pt states will consider further eval such as abd u/s if gets any worse  I spent 31 minutes in preparing to see the patient by review of recent labs, imaging and procedures, obtaining and reviewing separately obtained history, communicating with the patient and family or caregiver, ordering medications, tests or procedures, and documenting clinical information in the EHR including the differential Dx, treatment, and any further evaluation and other management of cp, hyperglycemia, depression

## 2020-02-04 ENCOUNTER — Ambulatory Visit
Admission: RE | Admit: 2020-02-04 | Discharge: 2020-02-04 | Disposition: A | Discharge status: Home / Self Care | Payer: 59 | Source: Ambulatory Visit | Attending: Internal Medicine | Admitting: Internal Medicine

## 2020-02-04 DIAGNOSIS — R1011 Right upper quadrant pain: Secondary | ICD-10-CM

## 2020-02-05 ENCOUNTER — Encounter: Payer: Self-pay | Admitting: Internal Medicine

## 2020-03-04 ENCOUNTER — Other Ambulatory Visit: Payer: Self-pay

## 2020-03-04 ENCOUNTER — Ambulatory Visit (INDEPENDENT_AMBULATORY_CARE_PROVIDER_SITE_OTHER): Payer: 59 | Admitting: Nurse Practitioner

## 2020-03-04 VITALS — BP 110/74 | HR 75 | Temp 97.5°F | Ht 69.0 in | Wt 282.0 lb

## 2020-03-04 DIAGNOSIS — G4730 Sleep apnea, unspecified: Secondary | ICD-10-CM

## 2020-03-04 DIAGNOSIS — Z8616 Personal history of COVID-19: Secondary | ICD-10-CM

## 2020-03-04 DIAGNOSIS — R5381 Other malaise: Secondary | ICD-10-CM

## 2020-03-04 NOTE — Progress Notes (Signed)
@Patient  ID: Harry Carroll, male    DOB: 03/03/1961, 59 y.o.   MRN: [id number]  Chief Complaint  Patient presents with  . Post COVID    Pos 12/2018. Sx: Breathing issues. Has already seen pulmonary and cardiology. All xray are cleared    Referring provider: Biagio Borg, MD  59 year old male with history of PVC, carotid stenosis, sleep apnea, diverticulitis, arthritis, hyperlipidemia.  Diagnosed with Covid 7/20.  HPI  Patient presents today for post COVID care clinic visit.  Patient was diagnosed with Covid in July 2020 and was admitted to the hospital that time with acute hypoxic respiratory failure.  Patient states that since that time he has been having ongoing issues with shortness of breath.  He states that he does get short of breath with exertion.  He has been an active over the past year.  He works from home.  He states that when he does try to exercise or work around the house he becomes short of breath and very fatigued quickly.  Patient has had full work-up with pulmonary and cardiology.    Patient admits that since he was diagnosed with sleep apnea 2 years ago he has not been compliant with his CPAP.  He recently ordered a new mask from the Internet and has started wearing his CPAP nightly.  He states that he has noticed improvement with this.  He has not had a recent follow-up with sleep medicine.  He is using his original settings for his CPAP.  Patient has gained weight since being diagnosed with sleep apnea we discussed that he may need his CPAP settings adjusted.  I can place a referral today for him to be seen by sleep medicine for this.  Denies f/c/s, n/v/d, hemoptysis, PND, chest pain or edema.        Allergies  Allergen Reactions  . Ciprofloxacin Shortness Of Breath, Nausea And Vomiting, Nausea Only, Rash and Other (See Comments)    Marked mucous production  . Dilaudid [Hydromorphone Hcl] Shortness Of Breath, Nausea Only and Other (See Comments)    Patient  developed rash, excessive mucous production and chest pain - tolerates morphine  . Hydrocodone Itching    Immunization History  Administered Date(s) Administered  . Hep A / Hep B 10/31/2019, 12/09/2019  . Influenza Split 03/10/2011  . Influenza,inj,Quad PF,6+ Mos 04/13/2013, 03/28/2016, 04/21/2017, 02/05/2019  . Influenza-Unspecified 03/13/2018  . PFIZER SARS-COV-2 Vaccination 09/19/2019, 10/15/2019  . Pneumococcal Polysaccharide-23 11/24/2011  . Td 06/14/1999, 01/29/2010  . Tdap 04/26/2019  . Zoster Recombinat (Shingrix) 06/26/2019, 08/22/2019    Past Medical History:  Diagnosis Date  . Arthritis   . Bipolar 1 disorder (HCC)    Dr Clovis Pu , pt denies  . Carotid stenosis   . Carpal tunnel syndrome    saw Dr Kristie Cowman , pt unaware  . Cervical spinal stenosis   . Colon polyps    adenomatous  . Complication of anesthesia    trouble placing catheter with last surgeery, had to get urologist  . Diverticulitis   . Diverticulosis   . Heart murmur    pt denies  . History of hiatal hernia    resolved  . Hyperlipemia   . Insomnia    related to pain  . Normal cardiac stress test    CP 01-2009---Nl ECHO and stress test neg  . Numbness of fingers of both hands    when sleeps, right hand worse than left  . PVC (premature ventricular contraction)    history  of PVC noted while in [employer]  . Restless leg   . Sleep apnea    does not use cpap due to does not like    Tobacco History: Social History   Tobacco Use  Smoking Status Former Smoker  . Types: Cigars  . Quit date: 06/13/2018  . Years since quitting: 1.7  Smokeless Tobacco Former Systems developer  . Types: Snuff  . Quit date: 12/10/2017  Tobacco Comment   1 per week   Counseling given: Yes Comment: 1 per week   Outpatient Encounter Medications as of 03/04/2020  Medication Sig  . Ascorbic Acid (VITA-C PO) Take 1 tablet by mouth daily.  Marland Kitchen aspirin EC 81 MG tablet Take 81 mg by mouth daily.  . Bempedoic Acid-Ezetimibe (NEXLIZET)  180-10 MG TABS Take 1 tablet by mouth daily.  Marland Kitchen VITAMIN D PO Take 1 tablet by mouth daily.  . Zinc Sulfate 220 (50 Zn) MG TABS Take 1 tablet by mouth daily.  Marland Kitchen albuterol (VENTOLIN HFA) 108 (90 Base) MCG/ACT inhaler Inhale 2 puffs into the lungs every 6 (six) hours as needed for wheezing or shortness of breath. (Patient not taking: Reported on 03/04/2020)   No facility-administered encounter medications on file as of 03/04/2020.     Review of Systems  Review of Systems  Constitutional: Negative.  Negative for fever.  HENT: Negative.   Respiratory: Positive for shortness of breath. Negative for cough.   Cardiovascular: Negative.  Negative for chest pain, palpitations and leg swelling.  Gastrointestinal: Negative.   Allergic/Immunologic: Negative.   Neurological: Positive for weakness.  Psychiatric/Behavioral: Negative.        Physical Exam  BP 110/74 (BP Location: Left Arm)   Pulse 75   Temp (!) 97.5 F (36.4 C)   Ht 5\' 9"  (1.753 m)   Wt 282 lb 0.1 oz (127.9 kg)   SpO2 95%   BMI 41.64 kg/m   Wt Readings from Last 5 Encounters:  03/04/20 282 lb 0.1 oz (127.9 kg)  01/17/20 280 lb (127 kg)  12/09/19 278 lb (126.1 kg)  11/26/19 275 lb (124.7 kg)  10/23/19 271 lb 12.8 oz (123.3 kg)     Physical Exam Vitals and nursing note reviewed.  Constitutional:      General: He is not in acute distress.    Appearance: He is well-developed.  Cardiovascular:     Rate and Rhythm: Normal rate and regular rhythm.  Pulmonary:     Effort: Pulmonary effort is normal.     Breath sounds: Normal breath sounds.  Musculoskeletal:     Right lower leg: No edema.     Left lower leg: No edema.  Skin:    General: Skin is warm and dry.  Neurological:     Mental Status: He is alert and oriented to person, place, and time.  Psychiatric:        Mood and Affect: Mood normal.        Behavior: Behavior normal.       Imaging: US Abdomen Complete  Result Date: 02/04/2020 CLINICAL DATA:   RIGHT upper quadrant abdominal pain for 2 months. EXAM: ABDOMEN ULTRASOUND COMPLETE COMPARISON:  CT abdomen dated 10/21/2015. FINDINGS: Gallbladder: Multiple gallstones, largest measuring 7 mm. No gallbladder wall thickening or pericholecystic fluid. No sonographic Murphy's sign elicited during the exam. Common bile duct: Diameter: 4 mm Liver: No focal lesion identified. Liver is diffusely echogenic indicating fatty infiltration. Portal vein is patent on color Doppler imaging with normal direction of blood flow towards the liver.  IVC: No abnormality visualized. Pancreas: Visualized portion unremarkable. Spleen: Size and appearance within normal limits. Right Kidney: Length: 11.7 cm. Echogenicity within normal limits. No mass or hydronephrosis visualized. Left Kidney: Length: 10.8 cm. Echogenicity within normal limits. No mass or hydronephrosis visualized. Abdominal aorta: No aneurysm visualized. Other findings: None. IMPRESSION: 1. No acute findings. 2. Cholelithiasis. No evidence of acute cholecystitis. 3. Fatty infiltration of the liver. Electronically Signed   By: Franki Cabot M.D.   On: 02/04/2020 10:49     Assessment & Plan:   History of COVID-19 Shortness of breath:  Will place referral to PT   Stay active  Stay well hydrated   Sleep apnea:  Will place referral to sleep Medicaine to follow up on sleep apnea  Follow up:  Follow up in 3 months or sooner if needed      Fenton Foy, NP 03/04/2020

## 2020-03-04 NOTE — Patient Instructions (Signed)
History of covid Shortness of breath:  Will place referral to PT   Stay active  Stay well hydrated   Sleep apnea:  Will place referral to sleep [facility] to follow up on sleep apnea  Follow up:  Follow up in 3 months or sooner if needed

## 2020-03-04 NOTE — Assessment & Plan Note (Signed)
Shortness of breath:  Will place referral to PT   Stay active  Stay well hydrated   Sleep apnea:  Will place referral to sleep Medic[facility] to follow up on sleep apnea  Follow up:  Follow up in 3 months or sooner if needed

## 2020-03-16 ENCOUNTER — Ambulatory Visit (INDEPENDENT_AMBULATORY_CARE_PROVIDER_SITE_OTHER): Payer: 59 | Admitting: Internal Medicine

## 2020-03-16 ENCOUNTER — Encounter: Payer: Self-pay | Admitting: Internal Medicine

## 2020-03-16 ENCOUNTER — Other Ambulatory Visit: Payer: Self-pay

## 2020-03-16 VITALS — BP 130/80 | HR 95 | Temp 97.6°F | Ht 69.0 in | Wt 277.6 lb

## 2020-03-16 DIAGNOSIS — G4733 Obstructive sleep apnea (adult) (pediatric): Secondary | ICD-10-CM

## 2020-03-16 DIAGNOSIS — Z23 Encounter for immunization: Secondary | ICD-10-CM | POA: Diagnosis not present

## 2020-03-16 DIAGNOSIS — R0600 Dyspnea, unspecified: Secondary | ICD-10-CM

## 2020-03-16 DIAGNOSIS — R0609 Other forms of dyspnea: Secondary | ICD-10-CM

## 2020-03-16 NOTE — Progress Notes (Signed)
03/16/20- 18 yoM former smoker for Pulmonary evaluation referred by Andres Ege, NP for sleep evaluation with hx OSA Followed by Dr Melvyn Novas for Post Covid infection. Medical problem list includes Post-Covid July 2020, OSA, Insomnia, Bipolar 1, Depression,  Upper Airway Cough, Hyperlipidemia, Diverticulosis, Carotid Stenosis,  HST 10/20/2011 AHI 31/ hr, desaturation to 77%, body weight 257 lbs CPAP ordered as Resmed S10 Air at 10 cwp to [facility]/ [facility] 06/21/13 Body weight today 277 lbs Covid vax- 2 Phizer Flu vax- today, standard After lapse while quite ill with Covid infection he is now usiong CPAP regularly and quite pleased with nasal pillows mask. Machine is old  And we discussed replacement. He says wife Love it" because it prevents his snoring, despite weight gain during Covid. Denies ENT surgery. Cardiology follows for PVCs, otherwise denies heart or lung problems.  Still easier DOE since Covid infection- not sure how much is obesity related. We discussed doing PFT.  Prior to Admission medications   Medication Sig Start Date End Date Taking? Authorizing Provider  albuterol (VENTOLIN HFA) 108 (90 Base) MCG/ACT inhaler Inhale 2 puffs into the lungs every 6 (six) hours as needed for wheezing or shortness of breath. 10/23/19  Yes Tanda Rockers, MD  Ascorbic Acid (VITA-C PO) Take 1 tablet by mouth daily.   Yes [provider]  aspirin EC 81 MG tablet Take 81 mg by mouth daily.   Yes [provider]  Bempedoic Acid-Ezetimibe (NEXLIZET) 180-10 MG TABS Take 1 tablet by mouth daily. 10/09/19  Yes Lelon Perla, MD  VITAMIN D PO Take 1 tablet by mouth daily.   Yes [provider]  Zinc Sulfate 220 (50 Zn) MG TABS Take 1 tablet by mouth daily.   Yes [provider]   Past Medical History:  Diagnosis Date  . Arthritis   . Bipolar 1 disorder (HCC)    Dr Clovis Pu , pt denies  . Carotid stenosis   . Carpal tunnel syndrome    saw Dr Kristie Cowman , pt unaware  . Cervical  spinal stenosis   . Colon polyps    adenomatous  . Complication of anesthesia    trouble placing catheter with last surgeery, had to get urologist  . Diverticulitis   . Diverticulosis   . Heart murmur    pt denies  . History of hiatal hernia    resolved  . Hyperlipemia   . Insomnia    related to pain  . Normal cardiac stress test    CP 01-2009---Nl ECHO and stress test neg  . Numbness of fingers of both hands    when sleeps, right hand worse than left  . PVC (premature ventricular contraction)    history of PVC noted while in [employer]  . Restless leg   . Sleep apnea    does not use cpap due to does not like   Past Surgical History:  Procedure Laterality Date  . COLONOSCOPY WITH PROPOFOL N/A 04/02/2014   Procedure: COLONOSCOPY WITH PROPOFOL;  Surgeon: Inda Castle, MD;  Location: [facility];  Service: Endoscopy;  Laterality: N/A;  . CYSTOSCOPY WITH URETHRAL DILATATION  12/20/2011   Procedure: CYSTOSCOPY WITH URETHRAL DILATATION;  Surgeon: Earnstine Regal, MD;  Location: [facility];  Service: General;;  with foley insertion  . CYSTOSCOPY WITH URETHRAL DILATATION  12/20/2011   Procedure: CYSTOSCOPY WITH URETHRAL DILATATION;  Surgeon: Franchot Gallo, MD;  Location: [facility];  Service: Urology;;  . Consuela Mimes WITH URETHRAL DILATATION N/A 12/15/2017   Procedure: CYSTOSCOPY  WITH URETHRAL DILATATION;  Surgeon: Irine Seal, MD;  Location: [facility];  Service: Urology;  Laterality: N/A;  . HOT HEMOSTASIS N/A 04/02/2014   Procedure: HOT HEMOSTASIS (ARGON PLASMA COAGULATION/BICAP);  Surgeon: Inda Castle, MD;  Location: [facility];  Service: Endoscopy;  Laterality: N/A;  . maxillofacial surgery    . PARTIAL COLECTOMY  12/20/2011   Procedure: PARTIAL COLECTOMY;  Surgeon: Earnstine Regal, MD;  Location: [facility];  Service: General;  Laterality: N/A;  Sigmoid colectomy  . right knee arthroscopy     2007 x2  . right shoulder surgery     X2  . SHOULDER SURGERY     LEFT  . TOTAL KNEE ARTHROPLASTY  Left 12/15/2017   Procedure: LEFT TOTAL KNEE ARTHROPLASTY;  Surgeon: Sydnee Cabal, MD;  Location: [facility];  Service: Orthopedics;  Laterality: Left;  Adductor Block  . TOTAL KNEE ARTHROPLASTY Right 05/18/2018   Procedure: TOTAL KNEE ARTHROPLASTY;  Surgeon: Sydnee Cabal, MD;  Location: [facility];  Service: Orthopedics;  Laterality: Right;  with block   Family History  Problem Relation Age of Onset  . Prostate cancer Paternal Grandfather   . Cancer Paternal Aunt        lymph cancer  . Liver disease Mother   . Alcohol abuse Father   . Alcohol abuse Sister   . Colon cancer Neg Hx   . CAD Neg Hx   . Pancreatic cancer Neg Hx   . Rectal cancer Neg Hx   . Stomach cancer Neg Hx   . Drug abuse Neg Hx   . Anxiety disorder Neg Hx   . Bipolar disorder Neg Hx   . Depression Neg Hx    Social History   Socioeconomic History  . Marital status: Married    Spouse name: Not on file  . Number of children: 4  . Years of education: 48  . Highest education level: Not on file  Occupational History  . Occupation: Health visitor: [employer]  Tobacco Use  . Smoking status: Former Smoker    Types: Cigars    Quit date: 06/13/2018    Years since quitting: 1.7  . Smokeless tobacco: Former Systems developer    Types: Snuff    Quit date: 12/10/2017  . Tobacco comment: 1 per week  Vaping Use  . Vaping Use: Never used  Substance and Sexual Activity  . Alcohol use: Yes    Comment:  a couple beers a few times a week   . Drug use: No    Comment: denies THC, cocainse, stimulants, pain meds, benzos, synthetic drugs  . Sexual activity: Not Currently  Other Topics Concern  . Not on file  Social History Narrative   Born in [place] and grew up in [place]. Currently resides in a house with his wife. 1 dog, 1 snake, 1 cat. 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    Denies religious beliefs that would effect health care.    Social Determinants of Health   Financial Resource Strain:   . Difficulty of Paying  Living Expenses: Not on file  Food Insecurity:   . Worried About Charity fundraiser in the Last Year: Not on file  . Ran Out of Food in the Last Year: Not on file  Transportation Needs:   . Lack of Transportation (Medical): Not on file  . Lack of Transportation (Non-Medical): Not on file  Physical Activity:   . Days of Exercise per Week: Not on file  . Minutes  of Exercise per Session: Not on file  Stress:   . Feeling of Stress : Not on file  Social Connections:   . Frequency of Communication with Friends and Family: Not on file  . Frequency of Social Gatherings with Friends and Family: Not on file  . Attends Religious Services: Not on file  . Active Member of Clubs or Organizations: Not on file  . Attends Archivist Meetings: Not on file  . Marital Status: Not on file  Intimate Partner Violence:   . Fear of Current or Ex-Partner: Not on file  . Emotionally Abused: Not on file  . Physically Abused: Not on file  . Sexually Abused: Not on file   ROS-see HPI   + = positive Constitutional:    weight loss, night sweats, fevers, chills, fatigue, lassitude. HEENT:    headaches, difficulty swallowing, tooth/dental problems, sore throat,       sneezing, itching, ear ache, nasal congestion, post nasal drip, snoring CV:    chest pain, orthopnea, PND, swelling in lower extremities, anasarca,                                  dizziness, +palpitations Resp:   +shortness of breath with exertion or at rest.                productive cough,  +non-productive cough, coughing up of blood.              change in color of mucus.  wheezing.   Skin:    rash or lesions. GI:  No-   heartburn, indigestion, abdominal pain, nausea, vomiting, diarrhea,                 change in bowel habits, loss of appetite GU: dysuria, change in color of urine, no urgency or frequency.   flank pain. MS:   joint pain, stiffness, decreased range of motion, back pain. Neuro-     nothing unusual Psych:  change in  mood or affect.  depression or anxiety.   memory loss.  OBJ- Physical Exam General- Alert, Oriented, Affect-appropriate, Distress- none acute+ obese+ Skin- rash-none, lesions- none, excoriation- none Lymphadenopathy- none Head- atraumatic            Eyes- Gross vision intact, PERRLA, conjunctivae and secretions clear            Ears- Hearing, canals-normal            Nose- Clear, no-Septal dev, mucus, polyps, erosion, perforation             Throat- Mallampati II , mucosa clear , drainage- none, tonsils- atrophic, + teeth Neck- flexible , trachea midline, no stridor , thyroid nl, carotid no bruit Chest - symmetrical excursion , unlabored           Heart/CV- RRR , no murmur , no gallop  , no rub, nl s1 s2                           - JVD- none , edema- none, stasis changes- none, varices- none           Lung- clear to P&A, wheeze- none, cough- none , dullness-none, rub- none           Chest wall-  Abd-  Br/ Gen/ Rectal- Not done, not indicated Extrem- cyanosis- none, clubbing, none, atrophy- none, strength- nl Neuro-  grossly intact to observation

## 2020-03-16 NOTE — Assessment & Plan Note (Signed)
Not clear how much is due to obesity/ deconditiong, or if connected to ressidual from Covid infection Plan- PFT, standard /flu vaccine

## 2020-03-16 NOTE — Patient Instructions (Signed)
Order- DME [facility]- Please replace old CPAP machine, changing to auto 5-15, mask of choice, humidifier, supplies, AirView/ card    Dx OSA  Order- schedule PFT   Dx dyspnea on exertion  Order- Flu vax- standard  Please call if we can help

## 2020-03-16 NOTE — Assessment & Plan Note (Signed)
Benefits from CPAP Planp replace old machine, changing to auto 5-15

## 2020-04-30 NOTE — Progress Notes (Signed)
HPI: FU CP. Carotid Dopplers 12/18 showed 1-39% bilateralstenosis. Stress echo 12/18 technically difficult but felt to likely be normal.  Patient developed COVID in June 2020.  Echocardiogram August 2020 showed normal LV function.  Since last seen,he has some dyspnea on exertion predominately since he Covid in June 2020.  However it is slowly improving.  He denies orthopnea, PND, pedal edema, exertional chest pain or syncope.  Current Outpatient Medications  Medication Sig Dispense Refill  . albuterol (VENTOLIN HFA) 108 (90 Base) MCG/ACT inhaler Inhale 2 puffs into the lungs every 6 (six) hours as needed for wheezing or shortness of breath. 8 g 0  . Ascorbic Acid (VITA-C PO) Take 1 tablet by mouth daily.    Marland Kitchen aspirin EC 81 MG tablet Take 81 mg by mouth daily.    . Bempedoic Acid-Ezetimibe (NEXLIZET) 180-10 MG TABS Take 1 tablet by mouth daily. 90 tablet 3  . VITAMIN D PO Take 1 tablet by mouth daily.    . Zinc Sulfate 220 (50 Zn) MG TABS Take 1 tablet by mouth daily.     No current facility-administered medications for this visit.     Past Medical History:  Diagnosis Date  . Arthritis   . Bipolar 1 disorder (HCC)    Dr Clovis Pu , pt denies  . Carotid stenosis   . Carpal tunnel syndrome    saw Dr Kristie Cowman , pt unaware  . Cervical spinal stenosis   . Colon polyps    adenomatous  . Complication of anesthesia    trouble placing catheter with last surgeery, had to get urologist  . Diverticulitis   . Diverticulosis   . Heart murmur    pt denies  . History of hiatal hernia    resolved  . Hyperlipemia   . Insomnia    related to pain  . Normal cardiac stress test    CP 01-2009---Nl ECHO and stress test neg  . Numbness of fingers of both hands    when sleeps, right hand worse than left  . PVC (premature ventricular contraction)    history of PVC noted while in [employer]  . Restless leg   . Sleep apnea    does not use cpap due to does not like    Past Surgical History:    Procedure Laterality Date  . COLONOSCOPY WITH PROPOFOL N/A 04/02/2014   Procedure: COLONOSCOPY WITH PROPOFOL;  Surgeon: Inda Castle, MD;  Location: [facility];  Service: Endoscopy;  Laterality: N/A;  . CYSTOSCOPY WITH URETHRAL DILATATION  12/20/2011   Procedure: CYSTOSCOPY WITH URETHRAL DILATATION;  Surgeon: Earnstine Regal, MD;  Location: [facility];  Service: General;;  with foley insertion  . CYSTOSCOPY WITH URETHRAL DILATATION  12/20/2011   Procedure: CYSTOSCOPY WITH URETHRAL DILATATION;  Surgeon: Franchot Gallo, MD;  Location: [facility];  Service: Urology;;  . Consuela Mimes WITH URETHRAL DILATATION N/A 12/15/2017   Procedure: CYSTOSCOPY WITH URETHRAL DILATATION;  Surgeon: Irine Seal, MD;  Location: [facility];  Service: Urology;  Laterality: N/A;  . HOT HEMOSTASIS N/A 04/02/2014   Procedure: HOT HEMOSTASIS (ARGON PLASMA COAGULATION/BICAP);  Surgeon: Inda Castle, MD;  Location: [facility];  Service: Endoscopy;  Laterality: N/A;  . maxillofacial surgery    . PARTIAL COLECTOMY  12/20/2011   Procedure: PARTIAL COLECTOMY;  Surgeon: Earnstine Regal, MD;  Location: [facility];  Service: General;  Laterality: N/A;  Sigmoid colectomy  . right knee arthroscopy     2007 x2  . right shoulder surgery  X2  . SHOULDER SURGERY     LEFT  . TOTAL KNEE ARTHROPLASTY Left 12/15/2017   Procedure: LEFT TOTAL KNEE ARTHROPLASTY;  Surgeon: Sydnee Cabal, MD;  Location: [facility];  Service: Orthopedics;  Laterality: Left;  Adductor Block  . TOTAL KNEE ARTHROPLASTY Right 05/18/2018   Procedure: TOTAL KNEE ARTHROPLASTY;  Surgeon: Sydnee Cabal, MD;  Location: [facility];  Service: Orthopedics;  Laterality: Right;  with block    Social History   Socioeconomic History  . Marital status: Married    Spouse name: Not on file  . Number of children: 4  . Years of education: 2  . Highest education level: Not on file  Occupational History  . Occupation: Health visitor: [employer]  Tobacco Use  . Smoking status:  Former Smoker    Types: Cigars    Quit date: 06/13/2018    Years since quitting: 1.9  . Smokeless tobacco: Former Systems developer    Types: Snuff    Quit date: 12/10/2017  . Tobacco comment: 1 per week  Vaping Use  . Vaping Use: Never used  Substance and Sexual Activity  . Alcohol use: Yes    Comment:  a couple beers a few times a week   . Drug use: No    Comment: denies THC, cocainse, stimulants, pain meds, benzos, synthetic drugs  . Sexual activity: Not Currently  Other Topics Concern  . Not on file  Social History Narrative   Born in [place] and grew up in [place]. Currently resides in a house with his wife. 1 dog, 1 snake, 1 cat. 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    Denies religious beliefs that would effect health care.    Social Determinants of Health   Financial Resource Strain:   . Difficulty of Paying Living Expenses: Not on file  Food Insecurity:   . Worried About Charity fundraiser in the Last Year: Not on file  . Ran Out of Food in the Last Year: Not on file  Transportation Needs:   . Lack of Transportation (Medical): Not on file  . Lack of Transportation (Non-Medical): Not on file  Physical Activity:   . Days of Exercise per Week: Not on file  . Minutes of Exercise per Session: Not on file  Stress:   . Feeling of Stress : Not on file  Social Connections:   . Frequency of Communication with Friends and Family: Not on file  . Frequency of Social Gatherings with Friends and Family: Not on file  . Attends Religious Services: Not on file  . Active Member of Clubs or Organizations: Not on file  . Attends Archivist Meetings: Not on file  . Marital Status: Not on file  Intimate Partner Violence:   . Fear of Current or Ex-Partner: Not on file  . Emotionally Abused: Not on file  . Physically Abused: Not on file  . Sexually Abused: Not on file    Family History  Problem Relation Age of Onset  . Prostate cancer Paternal Grandfather   . Cancer Paternal Aunt          lymph cancer  . Liver disease Mother   . Alcohol abuse Father   . Alcohol abuse Sister   . Colon cancer Neg Hx   . CAD Neg Hx   . Pancreatic cancer Neg Hx   . Rectal cancer Neg Hx   . Stomach cancer Neg Hx   . Drug abuse Neg Hx   .  Anxiety disorder Neg Hx   . Bipolar disorder Neg Hx   . Depression Neg Hx     ROS: no fevers or chills, productive cough, hemoptysis, dysphasia, odynophagia, melena, hematochezia, dysuria, hematuria, rash, seizure activity, orthopnea, PND, pedal edema, claudication. Remaining systems are negative.  Physical Exam: Well-developed well-nourished in no acute distress.  Skin is warm and dry.  HEENT is normal.  Neck is supple.  Chest is clear to auscultation with normal expansion.  Cardiovascular exam is regular rate and rhythm.  Abdominal exam nontender or distended. No masses palpated. Extremities show no edema. neuro grossly intact  ECG-sinus rhythm at a rate of 81, left axis deviation, left ventricular hypertrophy.  Personally reviewed  A/P  1 carotid artery disease-continue aspirin and statin.  Mild on most recent study.  2 hyperlipidemia-check lipids and liver.  Continue present medications.  3 tobacco abuse-patient has now discontinued.  Kirk Ruths, MD

## 2020-05-11 ENCOUNTER — Ambulatory Visit (INDEPENDENT_AMBULATORY_CARE_PROVIDER_SITE_OTHER): Payer: 59 | Admitting: Gastroenterology

## 2020-05-11 DIAGNOSIS — Z23 Encounter for immunization: Secondary | ICD-10-CM

## 2020-05-12 ENCOUNTER — Encounter: Payer: Self-pay | Admitting: Cardiology

## 2020-05-12 ENCOUNTER — Other Ambulatory Visit: Payer: Self-pay

## 2020-05-12 ENCOUNTER — Ambulatory Visit: Payer: 59 | Admitting: Cardiology

## 2020-05-12 VITALS — BP 132/78 | HR 81 | Ht 69.0 in | Wt 281.0 lb

## 2020-05-12 DIAGNOSIS — I6523 Occlusion and stenosis of bilateral carotid arteries: Secondary | ICD-10-CM

## 2020-05-12 DIAGNOSIS — E7800 Pure hypercholesterolemia, unspecified: Secondary | ICD-10-CM

## 2020-05-12 NOTE — Patient Instructions (Addendum)

## 2020-05-19 ENCOUNTER — Encounter: Payer: Self-pay | Admitting: Internal Medicine

## 2020-06-02 ENCOUNTER — Ambulatory Visit: Payer: 59

## 2020-06-03 ENCOUNTER — Ambulatory Visit: Payer: 59

## 2020-06-15 ENCOUNTER — Encounter: Payer: Self-pay | Admitting: *Deleted

## 2020-06-18 ENCOUNTER — Encounter: Payer: Self-pay | Admitting: Internal Medicine

## 2020-06-21 NOTE — Progress Notes (Signed)
HPI M former smoker followed for OSA, complicated by Covid infection July 2020( Dr Melvyn Novas),  Insomnia, Bipolar 1, Depression, Upper Airway Cough, Hyperlipidemia, Diverticulosis, Carotid Stenosis, HST 10/20/2011 AHI 31/ hr, desaturation to 77%, body weight 257 lbs PFT 06/22/20- WNL ===================================================================  03/16/20- 58 yoM former smoker for Pulmonary evaluation referred by Andres Ege, NP for sleep evaluation with hx OSA Followed by Dr Melvyn Novas for Post Covid infection. Medical problem list includes Post-Covid July 2020, OSA, Insomnia, Bipolar 1, Depression,  Upper Airway Cough, Hyperlipidemia, Diverticulosis, Carotid Stenosis,  HST 10/20/2011 AHI 31/ hr, desaturation to 77%, body weight 257 lbs CPAP ordered as Resmed S10 Air at 10 cwp to [facility]/ [facility] 06/21/13 Body weight today 277 lbs Covid vax- 2 Phizer Flu vax- today, standard After lapse while quite ill with Covid infection he is now usiong CPAP regularly and quite pleased with nasal pillows mask. Machine is old  And we discussed replacement. He says wife "Loves it" because it prevents his snoring, despite weight gain during Covid. Denies ENT surgery. Cardiology follows for PVCs, otherwise denies heart or lung problems.  Still easier DOE since Covid infection- not sure how much is obesity related. We discussed doing PFT.  06/22/20- 59 yoM former smoker followed for OSA, complicated by Covid infection July 2020( Dr Melvyn Novas),  Insomnia, Bipolar 1, Depression,  Upper Airway Cough, Hyperlipidemia, Diverticulosis, Carotid Stenosis, CPAP auto 5-15/ [facility]   Ordered to Replace 03/16/20, patient is still waiting to get replacement CPAP Download- compliance 83%, AHI 0.9/ hr Body weight today- 281 lbs Covid vax-3 Phizer Flu vax-had He notes easier DOE steps and stairs. Trying to walk more. PFT 06/22/20- WNL- maybe minimal restriction, minimal reduction DLCO, but probably normal. No resp to BD CXR  12/09/19- FINDINGS: Frontal and lateral views of the chest demonstrate an unremarkable cardiac silhouette. No airspace disease, effusion, or pneumothorax. No acute bony abnormalities. IMPRESSION: 1. No acute intrathoracic process.  ROS-see HPI   + = positive Constitutional:    weight loss, night sweats, fevers, chills, fatigue, lassitude. HEENT:    headaches, difficulty swallowing, tooth/dental problems, sore throat,       sneezing, itching, ear ache, nasal congestion, post nasal drip, snoring CV:    chest pain, orthopnea, PND, swelling in lower extremities, anasarca,                                   dizziness, +palpitations Resp:   +shortness of breath with exertion or at rest.                productive cough,  +non-productive cough, coughing up of blood.              change in color of mucus.  wheezing.   Skin:    rash or lesions. GI:  No-   heartburn, indigestion, abdominal pain, nausea, vomiting, diarrhea,                 change in bowel habits, loss of appetite GU: dysuria, change in color of urine, no urgency or frequency.   flank pain. MS:   joint pain, stiffness, decreased range of motion, back pain. Neuro-     nothing unusual Psych:  change in mood or affect.  depression or anxiety.   memory loss.  OBJ- Physical Exam General- Alert, Oriented, Affect-appropriate, Distress- none acute+ obese+ Skin- rash-none, lesions- none, excoriation- none Lymphadenopathy- + small rubbery node L neck pointed out to  patient to watch and report Head- atraumatic            Eyes- Gross vision intact, PERRLA, conjunctivae and secretions clear            Ears- Hearing, canals-normal            Nose- Clear, no-Septal dev, mucus, polyps, erosion, perforation             Throat- Mallampati II , mucosa clear , drainage- none, tonsils- atrophic, + teeth Neck- flexible , trachea midline, no stridor , thyroid nl, carotid no bruit Chest - symmetrical excursion , unlabored           Heart/CV- RRR , no  murmur , no gallop  , no rub, nl s1 s2                           - JVD- none , edema- none, stasis changes- none, varices- none           Lung- clear to P&A, wheeze- none, cough- none , dullness-none, rub- none           Chest wall-  Abd-  Br/ Gen/ Rectal- Not done, not indicated Extrem- cyanosis- none, clubbing, none, atrophy- none, strength- nl Neuro- grossly intact to observation

## 2020-06-22 ENCOUNTER — Other Ambulatory Visit: Payer: Self-pay

## 2020-06-22 ENCOUNTER — Encounter: Payer: Self-pay | Admitting: Internal Medicine

## 2020-06-22 ENCOUNTER — Ambulatory Visit (INDEPENDENT_AMBULATORY_CARE_PROVIDER_SITE_OTHER): Payer: 59 | Admitting: Internal Medicine

## 2020-06-22 DIAGNOSIS — R0600 Dyspnea, unspecified: Secondary | ICD-10-CM

## 2020-06-22 DIAGNOSIS — G4733 Obstructive sleep apnea (adult) (pediatric): Secondary | ICD-10-CM

## 2020-06-22 DIAGNOSIS — R0609 Other forms of dyspnea: Secondary | ICD-10-CM

## 2020-06-22 LAB — PULMONARY FUNCTION TEST
DL/VA % pred: 114 %
DL/VA: 4.85 ml/min/mmHg/L
DLCO cor % pred: 96 %
DLCO cor: 25.77 ml/min/mmHg
DLCO unc % pred: 96 %
DLCO unc: 25.77 ml/min/mmHg
FEF 25-75 Post: 5.31 L/sec
FEF 25-75 Pre: 4.84 L/sec
FEF2575-%Change-Post: 9 %
FEF2575-%Pred-Post: 187 %
FEF2575-%Pred-Pre: 171 %
FEV1-%Change-Post: 2 %
FEV1-%Pred-Post: 110 %
FEV1-%Pred-Pre: 108 %
FEV1-Post: 3.81 L
FEV1-Pre: 3.73 L
FEV1FVC-%Change-Post: 0 %
FEV1FVC-%Pred-Pre: 113 %
FEV6-%Change-Post: 0 %
FEV6-%Pred-Post: 101 %
FEV6-%Pred-Pre: 100 %
FEV6-Post: 4.4 L
FEV6-Pre: 4.37 L
FEV6FVC-%Change-Post: 0 %
FEV6FVC-%Pred-Post: 104 %
FEV6FVC-%Pred-Pre: 104 %
FVC-%Change-Post: 1 %
FVC-%Pred-Post: 96 %
FVC-%Pred-Pre: 95 %
FVC-Post: 4.42 L
FVC-Pre: 4.37 L
Post FEV1/FVC ratio: 86 %
Post FEV6/FVC ratio: 100 %
Pre FEV1/FVC ratio: 85 %
Pre FEV6/FVC Ratio: 100 %
RV % pred: 61 %
RV: 1.36 L
TLC % pred: 83 %
TLC: 5.65 L

## 2020-06-22 NOTE — Progress Notes (Signed)
PFT done today. 

## 2020-06-22 NOTE — Patient Instructions (Signed)
We can continue CPAP auto 5-15.    We will contact [facility] for you to check on status of replacement machine ordered in October.  Ok to exercise- the brisk walking is good.   Keep a watch on the small lymph node in the left side of your neck. If it grows, or you start finding more, please let me know.  Please call if we can help

## 2020-07-06 ENCOUNTER — Ambulatory Visit: Payer: 59

## 2020-07-07 ENCOUNTER — Encounter: Payer: Self-pay | Admitting: *Deleted

## 2020-07-20 IMAGING — DX PORTABLE CHEST - 1 VIEW
1 series · 1 of 1 positions shown · non-contrast
Comparison: 04/17/2017 and earlier.

CLINICAL DATA: 57-year-old who tested positive for [id number]
approximately 2 weeks ago, presenting with fever, cough and diarrhea
which have shown some improvement, though shortness of breath has
worsened. Hypoxemia.

EXAM:
PORTABLE CHEST 1 VIEW

[chest ap]
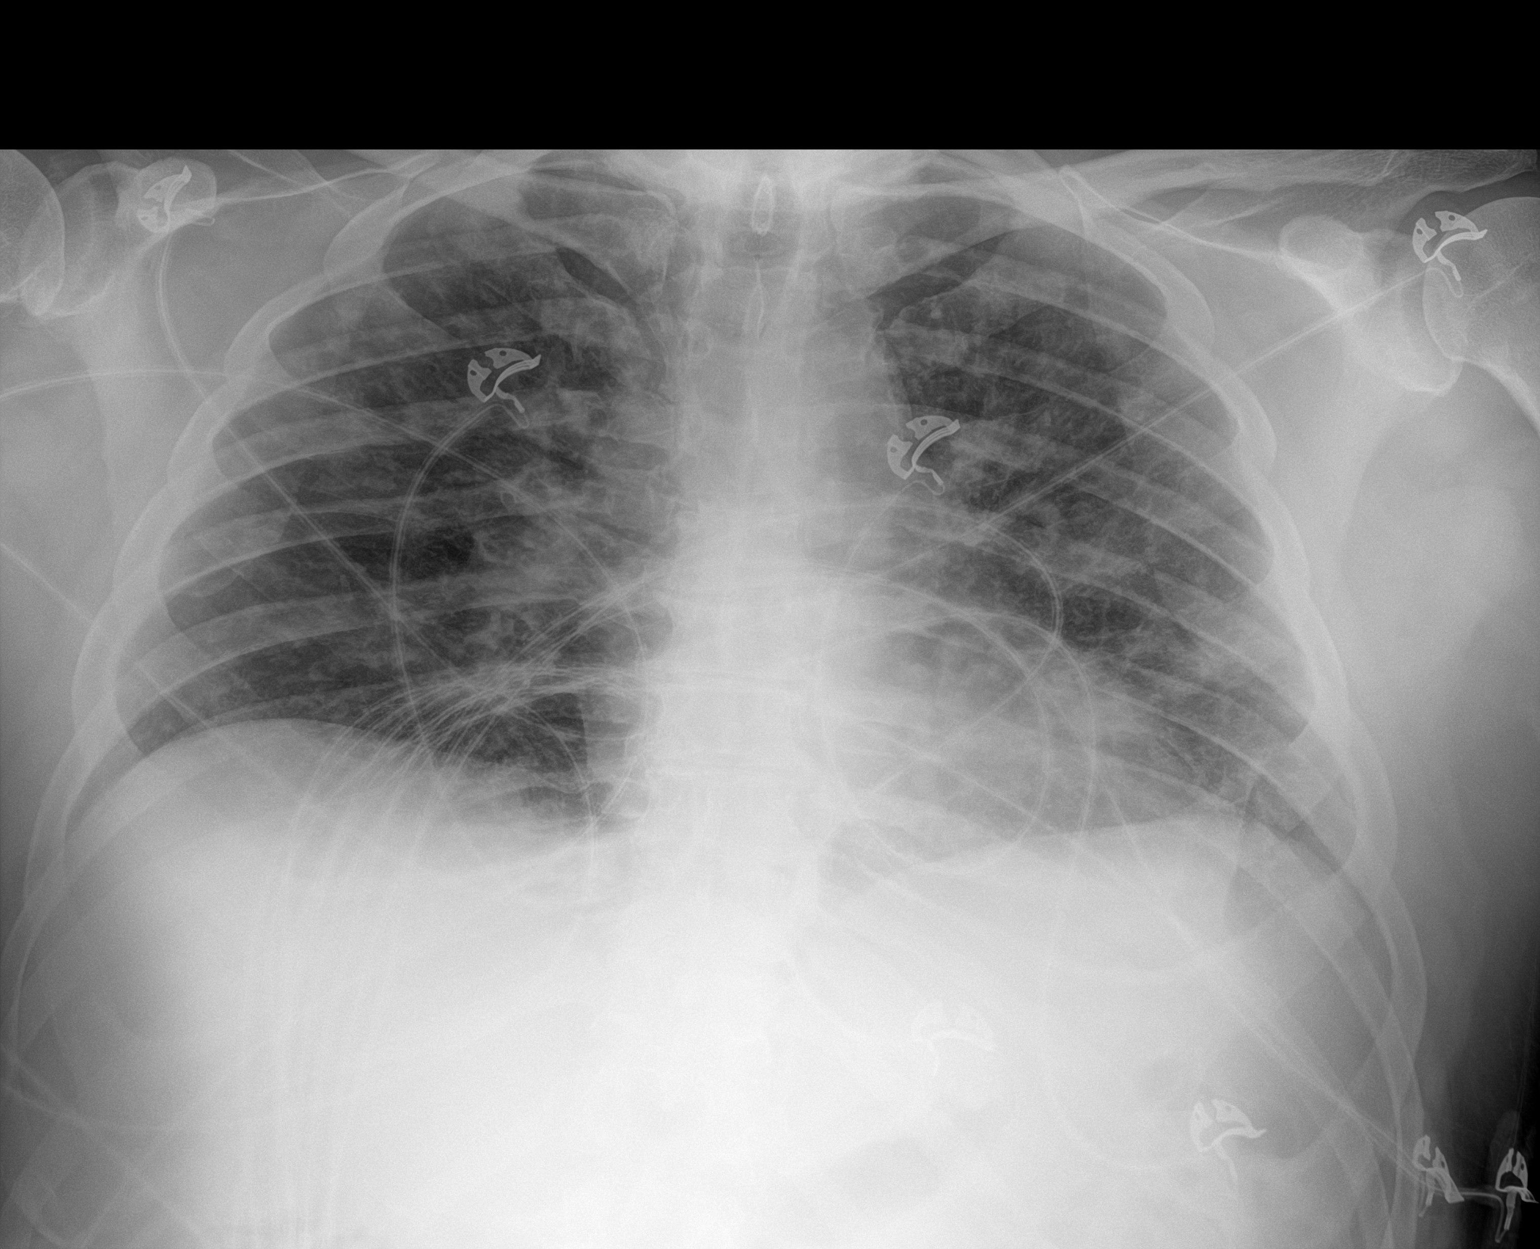

[1 of 1 positions shown; findings below may reference images not displayed]

FINDINGS: Cardiac silhouette normal in size for AP portable technique. Patchy
ground-glass airspace opacities in both lungs, LEFT greater than
RIGHT. Pulmonary vascularity normal. No visible pleural effusions.
IMPRESSION: Bilateral pneumonia, LEFT greater than RIGHT, likely viral.

## 2020-07-23 ENCOUNTER — Other Ambulatory Visit: Payer: Self-pay | Admitting: *Deleted

## 2020-07-23 DIAGNOSIS — E7800 Pure hypercholesterolemia, unspecified: Secondary | ICD-10-CM

## 2020-07-23 LAB — HEPATIC FUNCTION PANEL
ALT: 145 IU/L — ABNORMAL HIGH (ref 0–44)
AST: 90 IU/L — ABNORMAL HIGH (ref 0–40)
Albumin: 4.6 g/dL (ref 3.8–4.8)
Alkaline Phosphatase: 51 IU/L (ref 44–121)
Bilirubin Total: 0.6 mg/dL (ref 0.0–1.2)
Bilirubin, Direct: 0.24 mg/dL (ref 0.00–0.40)
Total Protein: 6.8 g/dL (ref 6.0–8.5)

## 2020-07-23 LAB — LIPID PANEL
Chol/HDL Ratio: 4.8 ratio (ref 0.0–5.0)
Cholesterol, Total: 221 mg/dL — ABNORMAL HIGH (ref 100–199)
HDL: 46 mg/dL (ref >39)
LDL Chol Calc (NIH): 148 mg/dL — ABNORMAL HIGH (ref 0–99)
Triglycerides: 149 mg/dL (ref 0–149)
VLDL Cholesterol Cal: 27 mg/dL (ref 5–40)

## 2020-08-04 NOTE — Assessment & Plan Note (Signed)
Continue encouragement to diet and exercise

## 2020-08-04 NOTE — Assessment & Plan Note (Signed)
Unremarkable PFT and CXR. Dyspnea likely reflects obesity, deconditioning and possible cardiovascular limitation.

## 2020-08-04 NOTE — Assessment & Plan Note (Signed)
Benefits from CPAP with good compliance and control Plan- continue CPAP 10, but ask DME when he can expect replacement machine set auto 5-15?

## 2020-08-20 ENCOUNTER — Ambulatory Visit (INDEPENDENT_AMBULATORY_CARE_PROVIDER_SITE_OTHER): Payer: 59 | Admitting: Pharmacist

## 2020-08-20 ENCOUNTER — Other Ambulatory Visit: Payer: Self-pay

## 2020-08-20 VITALS — BP 126/82 | HR 80 | Resp 16 | Ht 69.0 in | Wt 286.6 lb

## 2020-08-20 DIAGNOSIS — E785 Hyperlipidemia, unspecified: Secondary | ICD-10-CM | POA: Diagnosis not present

## 2020-08-20 NOTE — Patient Instructions (Addendum)
Your Results:             Your most recent labs Goal  Total Cholesterol 221 < 200  Triglycerides 149 < 150  HDL (good cholesterol) 46 > 40  LDL (bad cholesterol) 148 < 70      Medication changes: *Continue Nexlizet daily for 6 weeks, then repeat fasting blood work* *Will consider Praluent 150mg  every 14 days if LDL remains above 70 on follow up*  Lab orders: 6 weeks  Clinic phone number: [phone] (Deanthony Maull/[facility]ristin/[place])   Thank you for choosing [facility]

## 2020-08-20 NOTE — Progress Notes (Signed)
HPI:  Harry Carroll is a 60 y.o. male patient of Dr Stanford Breed, who presents today for a lipid clinic follow up.  PMH includes COVID infection in 2020, fatty liver with persistent elevated LTFs, carotid stenosis , bipolar 1, OSA (not on CAP), obesity, and alcohol use. Patient intolerant to rosuvastatin d/t elevated LFTs 3x nomal.  Patient started on Nexlizet > 1 year ago, but admits missing multiple doses per week and dietary indiscretions.   Current Medications: Nexlizet 180-10mg  daily (missing 2-3 doses every week)  Cholesterol Goals: LDL < 70   Intolerant/previously tried: rosuvastatin 20 mg caused increase in LFT's (04/2019)  Diet: has recently been trying to shift to a plant based diet, not very good in then last few months.  Exercise:  Unable to do much during second half of year due to covid/breathing issues  Social: denies tobacco, beers regularly and whiskey occasional  Labs:   07/22/20: CHO 221, TG 149, HDL 46, LDL-c 148  04/23/19:CHO 215, TG 170, HDL 48, LDL-c 137  Current Outpatient Medications  Medication Sig Dispense Refill   albuterol (VENTOLIN HFA) 108 (90 Base) MCG/ACT inhaler Inhale 2 puffs into the lungs every 6 (six) hours as needed for wheezing or shortness of breath. 8 g 0   Ascorbic Acid (VITA-C PO) Take 1 tablet by mouth daily.     Bempedoic Acid-Ezetimibe (NEXLIZET) 180-10 MG TABS Take 1 tablet by mouth daily. 90 tablet 3   VITAMIN D PO Take 1 tablet by mouth daily.     Zinc Sulfate 220 (50 Zn) MG TABS Take 1 tablet by mouth daily.     No current facility-administered medications for this visit.    Allergies  Allergen Reactions   Ciprofloxacin Shortness Of Breath, Nausea And Vomiting, Nausea Only, Rash and Other (See Comments)    Marked mucous production   Dilaudid [Hydromorphone Hcl] Shortness Of Breath, Nausea Only and Other (See Comments)    Patient developed rash, excessive mucous production and chest pain - tolerates morphine   Hydrocodone  Itching    Past Medical History:  Diagnosis Date   Arthritis    Bipolar 1 disorder (HCC)    Dr Clovis Pu , pt denies   Carotid stenosis    Carpal tunnel syndrome    saw Dr Kristie Cowman , pt unaware   Cervical spinal stenosis    Colon polyps    adenomatous   Complication of anesthesia    trouble placing catheter with last surgeery, had to get urologist   Diverticulitis    Diverticulosis    Heart murmur    pt denies   History of hiatal hernia    resolved   Hyperlipemia    Insomnia    related to pain   Normal cardiac stress test    CP 01-2009---Nl ECHO and stress test neg   Numbness of fingers of both hands    when sleeps, right hand worse than left   PVC (premature ventricular contraction)    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   Restless leg    Sleep apnea    does not use cpap due to does not like    Blood pressure 126/82, pulse 80, resp. rate 16, height 5\' 9"  (1.753 m), weight 286 lb 9.6 oz (130 kg), SpO2 97 %.   Hyperlipidemia LDL remains above goal, but patient admits missing multiple doses of his Nexlizet every week. Also admits dietary indiscretions since the Holidays, and minimal exercise. Still drinking beer and whiskey regularly. He is reluctant  ti try any injectable medication at this time, and not a good candidate for statin therapy d/t previous elevated LFTs with rosuvastatin.  Will resume therapy with Nexlizet, resume plant based diet, and work on reducing alcohol intake. I encouraged patient to visit GI specialist to follow up on fatty live diagnosis as well. Plan to repeat fasting blood work in 6 weeks, and start PCSK9i if LDL remains above goal.   Emiliana Blaize Rodriguez-Guzman PharmD, BCPS, [place] [id number] 08/30/2020 7:17 PM

## 2020-08-30 ENCOUNTER — Encounter: Payer: Self-pay | Admitting: Pharmacist

## 2020-08-30 NOTE — Assessment & Plan Note (Signed)
LDL remains above goal, but patient admits missing multiple doses of his Nexlizet every week. Also admits dietary indiscretions since the Holidays, and minimal exercise. Still drinking beer and whiskey regularly. He is reluctant ti try any injectable medication at this time, and not a good candidate for statin therapy d/t previous elevated LFTs with rosuvastatin.  Will resume therapy with Nexlizet, resume plant based diet, and work on reducing alcohol intake. I encouraged patient to visit GI specialist to follow up on fatty live diagnosis as well. Plan to repeat fasting blood work in 6 weeks, and start PCSK9i if LDL remains above goal.

## 2020-09-28 ENCOUNTER — Telehealth: Payer: Self-pay

## 2020-09-28 DIAGNOSIS — E785 Hyperlipidemia, unspecified: Secondary | ICD-10-CM

## 2020-09-28 NOTE — Telephone Encounter (Signed)
-----   Message from [place], [place] sent at 08/30/2020  7:02 PM EDT ----- Regarding: LDL check - 6 weeks on daily Nexlizet Patient due to repeat fasting blood work after 6 weeks taking Nexlizet daily.  If LDL > 70, patient agreed on Praluent therapy

## 2020-09-28 NOTE — Telephone Encounter (Signed)
Called and lmomed pt to complete fasting labs asap, ordered and released them

## 2020-11-12 ENCOUNTER — Encounter: Payer: Self-pay | Admitting: Internal Medicine

## 2020-11-13 ENCOUNTER — Other Ambulatory Visit: Payer: Self-pay | Admitting: Cardiology

## 2020-11-13 NOTE — Telephone Encounter (Signed)
Received MyChart message from patient asking about the status of his CPAP machine. I see that the order was placed back in October. The order states for the order to be sent to [facility] but it was sent to [facility]. Called [facility]. They confirmed that he is one of their clients from the [place] office but they have not received any orders for him.   [facility], can we send this order to [facility] instead of [facility]? Thanks!

## 2020-11-23 NOTE — Telephone Encounter (Signed)
Order faxed to [facility] and rec'd fax confirm.

## 2020-11-25 ENCOUNTER — Encounter: Payer: Self-pay | Admitting: *Deleted

## 2020-12-04 ENCOUNTER — Encounter: Payer: Self-pay | Admitting: Gastroenterology

## 2020-12-04 ENCOUNTER — Ambulatory Visit: Payer: 59 | Admitting: Gastroenterology

## 2020-12-04 VITALS — BP 110/70 | HR 71 | Ht 69.0 in | Wt 275.0 lb

## 2020-12-04 DIAGNOSIS — R1032 Left lower quadrant pain: Secondary | ICD-10-CM | POA: Diagnosis not present

## 2020-12-04 DIAGNOSIS — M6208 Separation of muscle (nontraumatic), other site: Secondary | ICD-10-CM

## 2020-12-04 DIAGNOSIS — K760 Fatty (change of) liver, not elsewhere classified: Secondary | ICD-10-CM | POA: Diagnosis not present

## 2020-12-04 DIAGNOSIS — Z8719 Personal history of other diseases of the digestive system: Secondary | ICD-10-CM | POA: Diagnosis not present

## 2020-12-04 LAB — HEPATIC FUNCTION PANEL
ALT: 114 IU/L — ABNORMAL HIGH (ref 0–44)
AST: 114 IU/L — ABNORMAL HIGH (ref 0–40)
Albumin: 4.7 g/dL (ref 3.8–4.8)
Alkaline Phosphatase: 43 IU/L — ABNORMAL LOW (ref 44–121)
Bilirubin Total: 0.8 mg/dL (ref 0.0–1.2)
Bilirubin, Direct: 0.25 mg/dL (ref 0.00–0.40)
Total Protein: 7.3 g/dL (ref 6.0–8.5)

## 2020-12-04 LAB — LIPID PANEL
Chol/HDL Ratio: 4.9 ratio (ref 0.0–5.0)
Cholesterol, Total: 190 mg/dL (ref 100–199)
HDL: 39 mg/dL — ABNORMAL LOW (ref >39)
LDL Chol Calc (NIH): 116 mg/dL — ABNORMAL HIGH (ref 0–99)
Triglycerides: 198 mg/dL — ABNORMAL HIGH (ref 0–149)
VLDL Cholesterol Cal: 35 mg/dL (ref 5–40)

## 2020-12-04 MED ORDER — AMOXICILLIN-POT CLAVULANATE 875-125 MG PO TABS: 1.0000 | ORAL_TABLET | Freq: Two times a day (BID) | ORAL | 0 refills | Status: DC

## 2020-12-04 NOTE — Progress Notes (Signed)
HPI :  60 year old male here for a follow-up visit for abdominal pain.  Recall patient had severe diverticulitis in 2013 for which he underwent surgical resection.  Past showed diverticulitis only.  He states he had a recurrence of diverticulitis in 2017, but no significant recurrence since then.   He complains today of recurrent left lower quadrant pain over the past 3 to 4 weeks.  States this can radiate to his left flank as well.  He states this feels like his prior diverticulitis type symptoms although much milder than he has had in the past.  Has no fever he endorses.  He states is a nagging dull mild pain that seems to be there mostly all the time.  Over the past few days seems to be getting worse, he is concerned he has developed recurring diverticulitis.  Other than his medications he is not taking any supplements or NSAIDs etc.  He is not finding any relief with a bowel movement.  His stools are normal without any blood, no diarrhea.  He denies any history of renal stones.  No dysuria.  He does have an allergy to Cipro historically.  His last colonoscopy with me was about a year ago, he had 6 polyps removed, most adenomas/sessile serrated, due for another exam in 2024.  He had diverticulosis noted in the left colon on exam.  Otherwise he does have a history of fatty liver and BMI greater than 40.  His liver enzymes have been fluctuating over the past few years but have remained elevated.  He had this last checked in February and his ALT was elevated to the 140s.  This is been presumed due to fatty liver.  He states his weight is stable he has not been able to lose weight but he knows he needs to do so.  He has had his knees replaced and has some limiting factors from orthopedic issues.  He is trying a new diet called "nuum".  He has not seen a nutritionist before for this.  He otherwise inquires about hernia he is noted in his upper abdomen when he sits up, will have some questions about that.   Does not really bother him other than noticing a bulge when he sits up.  Of note he has had an elevated ALT for several years, at least I can see dating back to 2014.  He drinks about 1 alcholic beverage a day.  Prior serologic work-up as showed negative hep B and hep C remotely, normal iron stores.  He states his mother had cirrhosis, he was not sure from what.  He has had fatty liver noted on imaging in the past as a suspected cause of his liver enzyme elevation.  He has been vaccinated to hep A and B.     Endoscopic evaluation: Colonoscopy 04/02/14 - 2 mm ascending colon polyp, diverticulosis Colonoscopy 03/28/13 - 4 polyps, largest 98mm - TA with HGD Colonoscopy 05/29/07 - multiple polyps removed, largest 72mm   Echo 02/06/19 - EF 60-65%   [place] 1.11.21 - gallstones, fatty liver   CT 10/21/15 - diverticulitis of descending colon   Colonoscopy 11/26/19 - The perianal and digital rectal examinations were normal. - Two sessile polyps were found in the cecum. The polyps were 4 to 5 mm in size. These polyps were removed with a cold snare. Resection and retrieval were complete. - A 4 mm polyp was found in the ascending colon. The polyp was sessile. The polyp was removed with a cold  snare. Resection and retrieval were complete. - Three sessile polyps were found in the hepatic flexure. The polyps were 4 to 5 mm in size. These polyps were removed with a cold snare. Resection and retrieval were complete. - Multiple medium-mouthed diverticula were found in the sigmoid colon, and a few in the hepatic flexure. - Internal hemorrhoids were found during retroflexion. - The exam was otherwise without abnormality.  Diagnosis Surgical [P], colon, hepatic flexure, cecum and ascending, polyp (6) - TUBULAR ADENOMA(S) WITHOUT HIGH-GRADE DYSPLASIA OR MALIGNANCY - SESSILE SERRATED POLYP(S) WITHOUT CYTOLOGIC DYSPLASIA - HYPERPLASTIC POLYP   US abdomen 02/04/20 - IMPRESSION: 1. No acute findings. 2.  Cholelithiasis. No evidence of acute cholecystitis. 3. Fatty infiltration of the liver.         Past Medical History:  Diagnosis Date   Arthritis    Bipolar 1 disorder ([place])    Dr Clovis Pu , pt denies   Carotid stenosis    Carpal tunnel syndrome    saw Dr Kristie Cowman , pt unaware   Cervical spinal stenosis    Colon polyps    adenomatous   Complication of anesthesia    trouble placing catheter with last surgeery, had to get urologist   Diverticulitis    Diverticulosis    Heart murmur    pt denies   History of hiatal hernia    resolved   Hyperlipemia    Insomnia    related to pain   Normal cardiac stress test    CP 01-2009---Nl ECHO and stress test neg   Numbness of fingers of both hands    when sleeps, right hand worse than left   PVC (premature ventricular contraction)    history of PVC noted while in [place]   Restless leg    Sleep apnea    does not use cpap due to does not like     Past Surgical History:  Procedure Laterality Date   COLONOSCOPY WITH PROPOFOL N/A 04/02/2014   Procedure: COLONOSCOPY WITH PROPOFOL;  Surgeon: Inda Castle, MD;  Location: [facility];  Service: Endoscopy;  Laterality: N/A;   CYSTOSCOPY WITH URETHRAL DILATATION  12/20/2011   Procedure: CYSTOSCOPY WITH URETHRAL DILATATION;  Surgeon: Earnstine Regal, MD;  Location: [facility];  Service: General;;  with foley insertion   CYSTOSCOPY WITH URETHRAL DILATATION  12/20/2011   Procedure: CYSTOSCOPY WITH URETHRAL DILATATION;  Surgeon: Franchot Gallo, MD;  Location: [facility];  Service: Urology;;   CYSTOSCOPY WITH URETHRAL DILATATION N/A 12/15/2017   Procedure: CYSTOSCOPY WITH URETHRAL DILATATION;  Surgeon: Irine Seal, MD;  Location: [facility];  Service: Urology;  Laterality: N/A;   HOT HEMOSTASIS N/A 04/02/2014   Procedure: HOT HEMOSTASIS (ARGON PLASMA COAGULATION/BICAP);  Surgeon: Inda Castle, MD;  Location: [facility];  Service: Endoscopy;  Laterality: N/A;   maxillofacial surgery     PARTIAL  COLECTOMY  12/20/2011   Procedure: PARTIAL COLECTOMY;  Surgeon: Earnstine Regal, MD;  Location: [facility];  Service: General;  Laterality: N/A;  Sigmoid colectomy   right knee arthroscopy     2007 x2   right shoulder surgery     X2   SHOULDER SURGERY     LEFT   TOTAL KNEE ARTHROPLASTY Left 12/15/2017   Procedure: LEFT TOTAL KNEE ARTHROPLASTY;  Surgeon: Sydnee Cabal, MD;  Location: [facility];  Service: Orthopedics;  Laterality: Left;  Adductor Block   TOTAL KNEE ARTHROPLASTY Right 05/18/2018   Procedure: TOTAL KNEE ARTHROPLASTY;  Surgeon: Sydnee Cabal, MD;  Location: [facility];  Service:  Orthopedics;  Laterality: Right;  with block   Family History  Problem Relation Age of Onset   Prostate cancer Paternal Grandfather    Cancer Paternal Aunt        lymph cancer   Liver disease Mother    Alcohol abuse Father    Alcohol abuse Sister    Colon cancer Neg Hx    CAD Neg Hx    Pancreatic cancer Neg Hx    Rectal cancer Neg Hx    Stomach cancer Neg Hx    Drug abuse Neg Hx    Anxiety disorder Neg Hx    Bipolar disorder Neg Hx    Depression Neg Hx    Social History   Tobacco Use   Smoking status: Former    Pack years: 0.00    Types: Cigars    Quit date: 06/13/2018    Years since quitting: 2.4   Smokeless tobacco: Former    Types: Snuff    Quit date: 12/10/2017   Tobacco comments:    1 per week  Vaping Use   Vaping Use: Never used  Substance Use Topics   Alcohol use: Yes    Comment:  a couple beers a few times a week    Drug use: No    Comment: denies THC, cocainse, stimulants, pain meds, benzos, synthetic drugs   Current Outpatient Medications  Medication Sig Dispense Refill   albuterol (VENTOLIN HFA) 108 (90 Base) MCG/ACT inhaler Inhale 2 puffs into the lungs every 6 (six) hours as needed for wheezing or shortness of breath. 8 g 0   NEXLIZET 180-10 MG TABS TAKE 1 TABLET BY MOUTH DAILY. 90 tablet 3   No current facility-administered medications for this visit.   Allergies  Allergen  Reactions   Ciprofloxacin Shortness Of Breath, Nausea And Vomiting, Nausea Only, Rash and Other (See Comments)    Marked mucous production   Dilaudid [Hydromorphone Hcl] Shortness Of Breath, Nausea Only and Other (See Comments)    Patient developed rash, excessive mucous production and chest pain - tolerates morphine   Hydrocodone Itching     Review of Systems: All systems reviewed and negative except where noted in HPI.   Lab Results  Component Value Date   ALT 145 (H) 07/22/2020   AST 90 (H) 07/22/2020   ALKPHOS 51 07/22/2020   BILITOT 0.6 07/22/2020      Physical Exam: BP 110/70   Pulse 71   Ht 5\' 9"  (1.753 m)   Wt 275 lb (124.7 kg)   SpO2 94%   BMI 40.61 kg/m  Constitutional: Pleasant,well-developed, male in no acute distress. Abdominal: Soft, protuberant, (+) diastasis recti, nontender.  There are no masses palpable.  Extremities: no edema Neurological: Alert and oriented to person place and time.Marland Kitchen Psychiatric: Normal mood and affect. Behavior is normal.   ASSESSMENT AND PLAN: 60 year old male here for reassessment of the following:  Left lower quadrant pain History of diverticulitis Fatty liver Morbid obesity Diastases recti  As above, he has had a history of diverticulitis in the past and now with 3 to 4 weeks of smoldering left lower quadrant pain.  He does feel like this represents recurrent diverticulitis although a bit atypical that its not as severe as these had in the past and without fevers etc.  He has already had a surgical resection in the past for this but additional diverticulosis noted on his last colonoscopy.  Discussed options with him.  We can do a CT scan to confirm whether or  not he has this or empirically give him a trial of antibiotics.  His preference is to try a course of antibiotics initially to see if this helps, and if he fails we will then proceed with a CT scan.  Will prescribe Augmentin twice daily for 1 week given his allergy to  Cipro.  Asked him to contact me if he feels worse or if he remains unchanged after this time.  We otherwise discussed his history of fatty liver disease, at risk for cirrhosis, and we also discussed his weight.  Strongly recommend he pursue dietary therapy for weight loss and he is doing that.  If he cannot do that on his own he should contact me and I am happy to refer him to a weight loss clinic.  Otherwise he has a diastases recti on exam today and reassured him of that, would not recommend surgery, hopefully with weight loss this will improve.  Plan: - Augmentin BID for one week, CT scan if no better  - counseled on fatty liver, weight loss - he has scheduled LFTs with PCP - at risk for cirrhosis with persistent elevation, if he cannot lose weight on his own we will recommend referral to weight loss clinic  [address] Cellar, MD [facility]

## 2020-12-04 NOTE — Patient Instructions (Addendum)
If you are age 62 or older, your body mass index should be between 23-30. Your Body mass index is 40.61 kg/m. If this is out of the aforementioned range listed, please consider follow up with your Primary Care Provider.  If you are age 30 or younger, your body mass index should be between 19-25. Your Body mass index is 40.61 kg/m. If this is out of the aformentioned range listed, please consider follow up with your Primary Care Provider.   __________________________________________________________  The [place] [facility] providers would like to encourage you to use [facility] to communicate with providers for non-urgent requests or questions.  Due to long hold times on the telephone, sending your provider a message by [facility] may be a faster and more efficient way to get a response.  Please allow 48 business hours for a response.  Please remember that this is for non-urgent requests.  __________________________________________________________  We have sent the following medications to your pharmacy for you to pick up at your convenience: Augmentin 875-125: Take one tablet twice a day for 7 days    Thank you for entrusting me with your care and for choosing [employer], Dr. Onslow Cellar

## 2020-12-07 ENCOUNTER — Telehealth: Payer: Self-pay

## 2020-12-07 NOTE — Telephone Encounter (Signed)
Lmom pt to call me back regarding results and to let him know that he is approved for praluent 150mg  to aide in lowering cholesterol if he is interested and let us know so we can send rx

## 2020-12-08 ENCOUNTER — Telehealth: Payer: Self-pay

## 2020-12-08 DIAGNOSIS — E785 Hyperlipidemia, unspecified: Secondary | ICD-10-CM

## 2020-12-08 DIAGNOSIS — E781 Pure hyperglyceridemia: Secondary | ICD-10-CM

## 2020-12-08 NOTE — Telephone Encounter (Signed)
Called and lmomed pt to call back to get results

## 2020-12-09 MED ORDER — PRALUENT 150 MG/ML ~~LOC~~ SOAJ: 150.0000 mg | SUBCUTANEOUS | 11 refills | Status: DC

## 2020-12-09 NOTE — Addendum Note (Signed)
Addended by: Allean Found on: 12/09/2020 11:55 AM   Modules accepted: Orders

## 2020-12-09 NOTE — Telephone Encounter (Signed)
Pt and called and stated that they would start the praluent 150mg  bi weekly and they will complete the fasting lipid and hepatic panel as I asked and ordered. Also I was able to get the pt a copay card and emailed it to him and the pt voiced understanding.

## 2020-12-11 ENCOUNTER — Other Ambulatory Visit: Payer: Self-pay

## 2020-12-11 DIAGNOSIS — R1032 Left lower quadrant pain: Secondary | ICD-10-CM

## 2020-12-11 DIAGNOSIS — Z8719 Personal history of other diseases of the digestive system: Secondary | ICD-10-CM

## 2020-12-11 MED ORDER — DICYCLOMINE HCL 10 MG PO CAPS: 10.0000 mg | ORAL_CAPSULE | Freq: Three times a day (TID) | ORAL | 0 refills | Status: DC

## 2020-12-17 ENCOUNTER — Other Ambulatory Visit: Payer: Self-pay

## 2020-12-17 MED ORDER — PRALUENT 150 MG/ML ~~LOC~~ SOAJ
150.0000 mg | SUBCUTANEOUS | 11 refills | Status: DC
Start: 2020-12-17 — End: 2020-12-23

## 2020-12-21 ENCOUNTER — Ambulatory Visit (HOSPITAL_COMMUNITY)
Admission: RE | Admit: 2020-12-21 | Discharge: 2020-12-21 | Disposition: A | Discharge status: Home / Self Care | Payer: 59 | Source: Ambulatory Visit | Attending: Gastroenterology | Admitting: Gastroenterology

## 2020-12-21 ENCOUNTER — Other Ambulatory Visit: Payer: Self-pay

## 2020-12-21 DIAGNOSIS — R1032 Left lower quadrant pain: Secondary | ICD-10-CM

## 2020-12-21 DIAGNOSIS — Z8719 Personal history of other diseases of the digestive system: Secondary | ICD-10-CM | POA: Insufficient documentation

## 2020-12-21 LAB — POCT I-STAT CREATININE: Creatinine, Ser: 0.9 mg/dL (ref 0.61–1.24)

## 2020-12-21 MED ORDER — SODIUM CHLORIDE (PF) 0.9 % IJ SOLN
INTRAMUSCULAR | Status: AC
  Filled 2020-12-21: qty 50

## 2020-12-21 MED ORDER — IOHEXOL 300 MG/ML  SOLN
100.0000 mL | Freq: Once | INTRAMUSCULAR | Status: AC | PRN
  Administered 2020-12-21: 100 mL via INTRAVENOUS

## 2020-12-22 ENCOUNTER — Other Ambulatory Visit: Payer: Self-pay

## 2020-12-22 DIAGNOSIS — K7460 Unspecified cirrhosis of liver: Secondary | ICD-10-CM

## 2020-12-23 ENCOUNTER — Other Ambulatory Visit: Payer: Self-pay

## 2020-12-23 MED ORDER — PRALUENT 150 MG/ML ~~LOC~~ SOAJ: 150.0000 mg | SUBCUTANEOUS | 11 refills | Status: DC

## 2021-01-05 DIAGNOSIS — G4733 Obstructive sleep apnea (adult) (pediatric): Secondary | ICD-10-CM

## 2021-01-05 NOTE — Telephone Encounter (Signed)
Working on this

## 2021-01-05 NOTE — Telephone Encounter (Signed)
Please ask one of our PCCs to make this a priority. It sounds as if we dropped the ball with initial misdirection. Ok to repeat the previous order. thanks

## 2021-01-05 NOTE — Telephone Encounter (Signed)
PCCs could you please follow up with [facility] ([facility] pt) regarding following My Chart message and also advise on the questions asked?  I still have not received a CPAP from [facility] which was ordered in October of last year. The last time I checked with you all [facility] called again and made promises they did not keep. Is this [facility] group the only option available?... is there anyone else in the world who can provide such a device?  Something is really wrong here it seems.   Please let me know if you have any other options available. Perhaps these is something on [facility] you can suggest... direct order from somewhere?   Thanks!  I spoke with [facility] who stated that pt would need new C-Pap order and current OV notes. According to notes these were faxed in June. Please advise

## 2021-01-05 NOTE — Telephone Encounter (Signed)
Spoke w/ Dawn at [facility] to check status of CPAP order.  Order was placed Oct 2022 and sent to [facility] and not [facility] so they never rec'd it. It was then sent to [facility] June 2022 and forwarded to there [facility] department for processing since pt was already established. There REPAP department does not have access to Epic and sent several request to obtain chart notes per [facility]. B/c they never rec'd chart notes, order was cancelled on 12/20/2020.  Dawn states a new cpap order needs to be placed and she and her team will their best to expedite the order w/ a 1-3 week turn around time.  Please Advise.

## 2021-01-05 NOTE — Telephone Encounter (Signed)
Dr. Annamaria Boots please advise on the following My Chart message string. Thank you

## 2021-01-06 ENCOUNTER — Encounter: Payer: Self-pay | Admitting: Pulmonary Disease

## 2021-01-06 NOTE — Telephone Encounter (Signed)
Harry Carroll called w/ [facility] and confirmed order was received and will be processed ASAP and pt should have CPAP this week.

## 2021-01-06 NOTE — Telephone Encounter (Signed)
Thank you!  Mychart message sent to pt as an update on this letting him know that he will be receiving a cpap machine this week. Nothing further needed.

## 2021-01-06 NOTE — Telephone Encounter (Signed)
New order has been placed and I have placed it as urgent as pt has been waiting since 10/21 to receive cpap.

## 2021-01-06 NOTE — Telephone Encounter (Signed)
Please place new CPAP order.

## 2021-01-06 NOTE — Telephone Encounter (Signed)
Routing this to PCCs as a high priority. Please advise.

## 2021-01-06 NOTE — Telephone Encounter (Signed)
CM sent to [facility] and fax. LVM w/ Dawn from [facility] to confirm order was rec'd.   Holding for confirmation.

## 2021-02-08 ENCOUNTER — Other Ambulatory Visit (INDEPENDENT_AMBULATORY_CARE_PROVIDER_SITE_OTHER): Payer: 59

## 2021-02-08 DIAGNOSIS — K7460 Unspecified cirrhosis of liver: Secondary | ICD-10-CM

## 2021-02-08 LAB — CBC WITH DIFFERENTIAL/PLATELET
Basophils Absolute: 0.1 10*3/uL (ref 0.0–0.1)
Basophils Relative: 1 % (ref 0.0–3.0)
Eosinophils Absolute: 0.2 10*3/uL (ref 0.0–0.7)
Eosinophils Relative: 2.7 % (ref 0.0–5.0)
HCT: 42.9 % (ref 39.0–52.0)
Hemoglobin: 14.9 g/dL (ref 13.0–17.0)
Lymphocytes Relative: 33.5 % (ref 12.0–46.0)
Lymphs Abs: 2 10*3/uL (ref 0.7–4.0)
MCHC: 34.7 g/dL (ref 30.0–36.0)
MCV: 91.4 fl (ref 78.0–100.0)
Monocytes Absolute: 0.4 10*3/uL (ref 0.1–1.0)
Monocytes Relative: 7.5 % (ref 3.0–12.0)
Neutro Abs: 3.3 10*3/uL (ref 1.4–7.7)
Neutrophils Relative %: 55.3 % (ref 43.0–77.0)
Platelets: 173 10*3/uL (ref 150.0–400.0)
RBC: 4.69 Mil/uL (ref 4.22–5.81)
RDW: 13.3 % (ref 11.5–15.5)
WBC: 6 10*3/uL (ref 4.0–10.5)

## 2021-02-08 LAB — PROTIME-INR
INR: 1 ratio (ref 0.8–1.0)
Prothrombin Time: 10.8 s (ref 9.6–13.1)

## 2021-02-09 LAB — AFP TUMOR MARKER: AFP-Tumor Marker: 5.2 ng/mL (ref <6.1)

## 2021-02-10 ENCOUNTER — Ambulatory Visit: Payer: 59 | Admitting: Gastroenterology

## 2021-02-10 ENCOUNTER — Encounter: Payer: Self-pay | Admitting: Gastroenterology

## 2021-02-10 VITALS — BP 134/84 | HR 80 | Ht 68.25 in | Wt 274.0 lb

## 2021-02-10 DIAGNOSIS — R932 Abnormal findings on diagnostic imaging of liver and biliary tract: Secondary | ICD-10-CM

## 2021-02-10 DIAGNOSIS — K760 Fatty (change of) liver, not elsewhere classified: Secondary | ICD-10-CM

## 2021-02-10 NOTE — Progress Notes (Signed)
HPI :  60 year old male here for follow-up visit.  I last saw him in the office on June 24.  At that time he was complaining of pretty severe left lower quadrant pain for a few weeks that he thought was related to diverticulitis as he had had that in the past.  I empirically treated him with antibiotics and we scheduled him for a CT scan.  He had a CT scan done on July 11, this showed no active diverticulitis but incidentally noted to have some morphologic changes of his liver concerning for possible cirrhosis.  Spleen was normal.  Recall we have followed him for history of fatty liver, his body mass index have been greater than 40.  His AST and ALT have fluctuated in the low 100s.  He has had a serologic work-up that has been negative for other etiologies and he has been vaccinated to hepatitis a and B.  I referred him to the colon health weight loss clinic and he received a phone call to be on a wait list but has not seen anybody yet.  That being said he has been very good about altering his diet and has been eating a significant amount of fruits and vegetables lately with his snacks.  He has lost some weight since have last seen him.  Historically he has drank beer, about 2 drinks per day over time.  He has not had any alcohol since we discussed the results of the CT scan with him and he is doing a good job and that light.  He states when he got diagnosed with COVID earlier in the year he lost about 30 pounds, so overall his weight is down significantly over the past year.  He states his abdominal pain has resolved, not bothering him at all anymore, no bowels.  We discussed potential cirrhosis and plans moving forward. He states his mother had cirrhosis,     Endoscopic evaluation: Colonoscopy 04/02/14 - 2 mm ascending colon polyp, diverticulosis Colonoscopy 03/28/13 - 4 polyps, largest 17m - TA with HGD Colonoscopy 05/29/07 - multiple polyps removed, largest 142m  Echo 02/06/19 - EF 60-65%   [place]1/11/21 - gallstones, fatty liver   CT 10/21/15 - diverticulitis of descending colon   Colonoscopy 11/26/19 - The perianal and digital rectal examinations were normal. - Two sessile polyps were found in the cecum. The polyps were 4 to 5 mm in size. These polyps were removed with a cold snare. Resection and retrieval were complete. - A 4 mm polyp was found in the ascending colon. The polyp was sessile. The polyp was removed with a cold snare. Resection and retrieval were complete. - Three sessile polyps were found in the hepatic flexure. The polyps were 4 to 5 mm in size. These polyps were removed with a cold snare. Resection and retrieval were complete. - Multiple medium-mouthed diverticula were found in the sigmoid colon, and a few in the hepatic flexure. - Internal hemorrhoids were found during retroflexion. - The exam was otherwise without abnormality.   Diagnosis Surgical [P], colon, hepatic flexure, cecum and ascending, polyp (6) - TUBULAR ADENOMA(S) WITHOUT HIGH-GRADE DYSPLASIA OR MALIGNANCY - SESSILE SERRATED POLYP(S) WITHOUT CYTOLOGIC DYSPLASIA - HYPERPLASTIC POLYP     [place]dom[place] 02/04/20 - IMPRESSION: 1. No acute findings. 2. Cholelithiasis. No evidence of acute cholecystitis. 3. Fatty infiltration of the liver.     CT scan abdomen pelvis with contrast 12/21/20: IMPRESSION: 1. No acute findings are noted in the abdomen or  pelvis to account for the patient's symptoms. Specifically, no evidence of significant diverticular disease or acute diverticulitis. 2. 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 Probable hepatic steatosis. 3. Cholelithiasis without evidence of acute cholecystitis at this time. 4. Aortic atherosclerosis.   Past Medical History:  Diagnosis Date   Arthritis    Bipolar 1 disorder ([place])    Dr Clovis Pu , pt denies   Carotid stenosis    Carpal tunnel syndrome    saw Dr Kristie Cowman , pt unaware   Cervical spinal stenosis    Colon polyps     adenomatous   Complication of anesthesia    trouble placing catheter with last surgeery, had to get urologist   Diverticulitis    Diverticulosis    Heart murmur    pt denies   History of hiatal hernia    resolved   Hyperlipemia    Insomnia    related to pain   Normal cardiac stress test    CP 01-2009---Nl ECHO and stress test neg   Numbness of fingers of both hands    when sleeps, right hand worse than left   PVC (premature ventricular contraction)    history of PVC noted while in [place]   Restless leg    Sleep apnea    does not use cpap due to does not like     Past Surgical History:  Procedure Laterality Date   COLONOSCOPY WITH PROPOFOL N/A 04/02/2014   Procedure: COLONOSCOPY WITH PROPOFOL;  Surgeon: Inda Castle, MD;  [facility]: [facility];  Service: Endoscopy;  Laterality: N/A;   CYSTOSCOPY WITH URETHRAL DILATATION  12/20/2011   Procedure: CYSTOSCOPY WITH URETHRAL DILATATION;  Surgeon: Earnstine Regal, MD;  [facility];  Service: General;;  with foley insertion   CYSTOSCOPY WITH URETHRAL DILATATION  12/20/2011   Procedure: CYSTOSCOPY WITH URETHRAL DILATATION;  Surgeon: Franchot Gallo, MD;  [facility];  Service: Urology;;   CYSTOSCOPY WITH URETHRAL DILATATION N/A 12/15/2017   Procedure: CYSTOSCOPY WITH URETHRAL DILATATION;  Surgeon: Irine Seal, MD;  [facility];  Service: Urology;  Laterality: N/A;   HOT HEMOSTASIS N/A 04/02/2014   Procedure: HOT HEMOSTASIS (ARGON PLASMA COAGULATION/BICAP);  Surgeon: Inda Castle, MD;  [facility];  Service: Endoscopy;  Laterality: N/A;   maxillofacial surgery     PARTIAL COLECTOMY  12/20/2011   Procedure: PARTIAL COLECTOMY;  Surgeon: Earnstine Regal, MD;  [facility];  Service: General;  Laterality: N/A;  Sigmoid colectomy   right knee arthroscopy     2007 x2   right shoulder surgery     X2   SHOULDER SURGERY     LEFT   TOTAL KNEE ARTHROPLASTY Left 12/15/2017   Procedure: LEFT TOTAL KNEE ARTHROPLASTY;   Surgeon: Sydnee Cabal, MD;  [facility];  Service: Orthopedics;  Laterality: Left;  Adductor Block   TOTAL KNEE ARTHROPLASTY Right 05/18/2018   Procedure: TOTAL KNEE ARTHROPLASTY;  Surgeon: Sydnee Cabal, MD;  [facility];  Service: Orthopedics;  Laterality: Right;  with block   Family History  Problem Relation Age of Onset   Prostate cancer Paternal Grandfather    Cancer Paternal Aunt        lymph cancer   Liver disease Mother    Alcohol abuse Father    Alcohol abuse Sister    Colon cancer Neg Hx    CAD Neg Hx    Pancreatic cancer Neg Hx    Rectal cancer Neg Hx    Stomach cancer  Neg Hx    Drug abuse Neg Hx    Anxiety disorder Neg Hx    Bipolar disorder Neg Hx    Depression Neg Hx    Social History   Tobacco Use   Smoking status: Former    Types: Cigars    Quit date: 06/13/2018    Years since quitting: 2.6   Smokeless tobacco: Former    Types: Snuff    Quit date: 12/10/2017   Tobacco comments:    1 per week  Vaping Use   Vaping Use: Never used  Substance Use Topics   Alcohol use: Yes    Comment:  a couple beers a few times a week    Drug use: No    Comment: denies THC, cocainse, stimulants, pain meds, benzos, synthetic drugs   Current Outpatient Medications  Medication Sig Dispense Refill   albuterol (VENTOLIN HFA) 108 (90 Base) MCG/ACT inhaler Inhale 2 puffs into the lungs every 6 (six) hours as needed for wheezing or shortness of breath. (Patient not taking: Reported on 02/10/2021) 8 g 0   Alirocumab (PRALUENT) 150 MG/ML SOAJ Inject 150 mg into the skin every 14 (fourteen) days. 2 mL 11   No current facility-administered medications for this visit.   Allergies  Allergen Reactions   Ciprofloxacin Shortness Of Breath, Nausea And Vomiting, Nausea Only, Rash and Other (See Comments)    Marked mucous production   Dilaudid [Hydromorphone Hcl] Shortness Of Breath, Nausea Only and Other (See Comments)    Patient developed rash, excessive mucous production  and chest pain - tolerates morphine   Hydrocodone Itching     Review of Systems: All systems reviewed and negative except where noted in HPI.   Lab Results  Component Value Date   WBC 6.0 02/08/2021   HGB 14.9 02/08/2021   HCT 42.9 02/08/2021   MCV 91.4 02/08/2021   PLT 173.0 02/08/2021    Lab Results  Component Value Date   CREATININE 0.90 12/21/2020   BUN 20 09/26/2019   NA 135 09/26/2019   K 4.3 09/26/2019   CL 102 09/26/2019   CO2 24 09/26/2019    Lab Results  Component Value Date   ALT 114 (H) 12/04/2020   AST 114 (H) 12/04/2020   ALKPHOS 43 (L) 12/04/2020   BILITOT 0.8 12/04/2020    Lab Results  Component Value Date   INR 1.0 02/08/2021   INR 0.91 05/07/2018   INR 1.17 12/06/2017      Physical Exam: BP 134/84 (BP Location: Left Arm, Patient Position: Sitting, Cuff Size: Normal)   Pulse 80   Ht 5' 8.25" (1.734 m) Comment: height measured without shoes  Wt 274 lb (124.3 kg)   BMI 41.36 kg/m  Constitutional: Pleasant,well-developed, male in no acute distress. Neurological: Alert and oriented to person place and time. Psychiatric: Normal mood and affect. Behavior is normal.   ASSESSMENT AND PLAN: 60 year old male here for reassessment of following:  Fatty liver Abnormal imaging of the liver  History of known fatty liver with elevation in his liver enzymes over time.  He has had a negative serologic work-up otherwise.  He did at baseline drink about 2 alcoholic beverages per day but also body mass index over 40, suspect more likely his weight had driven this process.  Imaging on CT suggests possible underlying cirrhosis however spleen is normal and his platelet count is normal.  I question whether he truly has cirrhosis, if he does have cirrhosis he is certainly compensated and has no  symptoms from it which we reviewed.  We discussed possibility for cirrhosis, risks for decompensation and risk for [place] moving forward.  We discussed fatty liver measures  to treat that.  He is working on weight loss and doing a good job since have last seen him.  At this point in time given his platelet count, spleen is normal and he has no evidence of portal hypertension on CT scan I think we can hold off on EGD for varices screening.  I would like to intervally image his liver in 6 months with an ultrasound with elastography to further evaluate.  At that time we will repeat his LFTs, CBC, INR, and AFP.  In the interim he will continue to work on weight loss and abstain from alcohol.  If he has any questions or changes in his status he will contact me.  He agreed with the plan.  Plan: - counseled on fatty liver at length and potential underlying cirrhosis as above - he will continue to work on weight loss via diet and exercise - he will continue to abstain from alcohol - ultrasound with elastography in 6 months from time of his CT scan - CBC, LFTs, INR, AFP in 6 months - contact me in the interim with any questions or concerns  Jolly Mango, MD [facility]

## 2021-02-10 NOTE — Patient Instructions (Signed)
If you are age 62 or older, your body mass index should be between 23-30. Your Body mass index is 41.36 kg/m. If this is out of the aforementioned range listed, please consider follow up with your Primary Care Provider.  If you are age 76 or younger, your body mass index should be between 19-25. Your Body mass index is 41.36 kg/m. If this is out of the aformentioned range listed, please consider follow up with your Primary Care Provider.   __________________________________________________________  The [place] [facility] providers would like to encourage you to use [facility] to communicate with providers for non-urgent requests or questions.  Due to long hold times on the telephone, sending your provider a message by [facility] may be a faster and more efficient way to get a response.  Please allow 48 business hours for a response.  Please remember that this is for non-urgent requests.   You will be due for labs in 6 months.  We will remind you when it is time to go to the lab.  You will be due for an Elastography of your liver in January 2023. We will let you know when it is time to schedule that.  Thank you for entrusting me with your care and for choosing [facility], Dr. H[place] Cellar

## 2021-05-21 ENCOUNTER — Other Ambulatory Visit: Payer: Self-pay

## 2021-05-21 ENCOUNTER — Encounter: Payer: Self-pay | Admitting: Gastroenterology

## 2021-05-21 ENCOUNTER — Encounter: Payer: Self-pay | Admitting: Internal Medicine

## 2021-05-21 ENCOUNTER — Ambulatory Visit: Payer: 59 | Admitting: Internal Medicine

## 2021-05-21 VITALS — BP 116/68 | HR 70 | Temp 99.1°F | Ht 68.25 in | Wt 285.0 lb

## 2021-05-21 DIAGNOSIS — R739 Hyperglycemia, unspecified: Secondary | ICD-10-CM | POA: Diagnosis not present

## 2021-05-21 DIAGNOSIS — F320 Major depressive disorder, single episode, mild: Secondary | ICD-10-CM | POA: Diagnosis not present

## 2021-05-21 DIAGNOSIS — M79644 Pain in right finger(s): Secondary | ICD-10-CM | POA: Diagnosis not present

## 2021-05-21 NOTE — Patient Instructions (Signed)
You will be contacted regarding the referral for: Dr Amedeo Plenty for the finger joint cyst  Please continue all other medications as before, and refills have been done if requested.  Please have the pharmacy call with any other refills you may need.  Please continue your efforts at being more active, low cholesterol diet, and weight control.  Please keep your appointments with your specialists as you may have planned

## 2021-05-21 NOTE — Progress Notes (Signed)
Patient ID: Harry Carroll, male   DOB: Oct 19, 1960, 60 y.o.   MRN: [id number]        Chief Complaint: follow up mass on finger joint       HPI:  Harry Carroll is a 60 y.o. male here with c/o new onset 2 wks mass to the DIP of the right index finger, kind of hard, constant, mild tender but firm and wondering how serious it might be.  No recent trauma.  Uses then hands for work intensely most days for many years.  Pt denies chest pain, increased sob or doe, wheezing, orthopnea, PND, increased LE swelling, palpitations, dizziness or syncope.   Pt denies polydipsia, polyuria, or new focal neuro s/s.   Pt denies fever, wt loss, night sweats, loss of appetite, or other constitutional symptoms  Unable to lose wt, pt would be interested in referral medical wt management for tx such as monjouro.       Denies worsening depressive symptoms, suicidal ideation, or panic; Wt Readings from Last 3 Encounters:  05/21/21 285 lb (129.3 kg)  02/10/21 274 lb (124.3 kg)  12/04/20 275 lb (124.7 kg)   BP Readings from Last 3 Encounters:  05/21/21 116/68  02/10/21 134/84  12/04/20 110/70         Past Medical History:  Diagnosis Date   Arthritis    Bipolar 1 disorder ([facility])    Dr Clovis Pu , pt denies   Carotid stenosis    Carpal tunnel syndrome    saw Dr Kristie Cowman , pt unaware   Cervical spinal stenosis    Colon polyps    adenomatous   Complication of anesthesia    trouble placing catheter with last surgeery, had to get urologist   Diverticulitis    Diverticulosis    Heart murmur    pt denies   History of hiatal hernia    resolved   Hyperlipemia    Insomnia    related to pain   Normal cardiac stress test    CP 01-2009---Nl ECHO and stress test neg   Numbness of fingers of both hands    when sleeps, right hand worse than left   PVC (premature ventricular contraction)    history of PVC noted while in [place]   Restless leg    Sleep apnea    does not use cpap due to does not like   Past Surgical  History:  Procedure Laterality Date   COLONOSCOPY WITH PROPOFOL N/A 04/02/2014   Procedure: COLONOSCOPY WITH PROPOFOL;  Surgeon: Inda Castle, MD;  [facility];  Service: Endoscopy;  Laterality: N/A;   CYSTOSCOPY WITH URETHRAL DILATATION  12/20/2011   Procedure: CYSTOSCOPY WITH URETHRAL DILATATION;  Surgeon: Earnstine Regal, MD;  [facility];  Service: General;;  with foley insertion   CYSTOSCOPY WITH URETHRAL DILATATION  12/20/2011   Procedure: CYSTOSCOPY WITH URETHRAL DILATATION;  Surgeon: Franchot Gallo, MD;  [facility];  Service: Urology;;   CYSTOSCOPY WITH URETHRAL DILATATION N/A 12/15/2017   Procedure: CYSTOSCOPY WITH URETHRAL DILATATION;  Surgeon: Irine Seal, MD;  [facility];  Service: Urology;  Laterality: N/A;   HOT HEMOSTASIS N/A 04/02/2014   Procedure: HOT HEMOSTASIS (ARGON PLASMA COAGULATION/BICAP);  Surgeon: Inda Castle, MD;  [facility];  Service: Endoscopy;  Laterality: N/A;   maxillofacial surgery     PARTIAL COLECTOMY  12/20/2011   Procedure: PARTIAL COLECTOMY;  Surgeon: Earnstine Regal, MD;  [facility];  Service: General;  Laterality: N/A;  Sigmoid colectomy   right knee arthroscopy     2007 x2   right shoulder surgery     X2   SHOULDER SURGERY     LEFT   TOTAL KNEE ARTHROPLASTY Left 12/15/2017   Procedure: LEFT TOTAL KNEE ARTHROPLASTY;  Surgeon: Sydnee Cabal, MD;  [facility];  Service: Orthopedics;  Laterality: Left;  Adductor Block   TOTAL KNEE ARTHROPLASTY Right 05/18/2018   Procedure: TOTAL KNEE ARTHROPLASTY;  Surgeon: Sydnee Cabal, MD;  [facility];  Service: Orthopedics;  Laterality: Right;  with block    reports that he quit smoking about 2 years ago. His smoking use included cigars. He quit smokeless tobacco use about 3 years ago.  His smokeless tobacco use included snuff. He reports current alcohol use. He reports that he does not use drugs. family history includes Alcohol abuse in his father and sister;  Cancer in his paternal aunt; Liver disease in his mother; Prostate cancer in his paternal grandfather. Allergies  Allergen Reactions   Ciprofloxacin Shortness Of Breath, Nausea And Vomiting, Nausea Only, Rash and Other (See Comments)    Marked mucous production   Dilaudid [Hydromorphone Hcl] Shortness Of Breath, Nausea Only and Other (See Comments)    Patient developed rash, excessive mucous production and chest pain - tolerates morphine   Hydrocodone Itching   Current Outpatient Medications on File Prior to Visit  Medication Sig Dispense Refill   albuterol (VENTOLIN HFA) 108 (90 Base) MCG/ACT inhaler Inhale 2 puffs into the lungs every 6 (six) hours as needed for wheezing or shortness of breath. 8 g 0   Alirocumab (PRALUENT) 150 MG/ML SOAJ Inject 150 mg into the skin every 14 (fourteen) days. 2 mL 11   No current facility-administered medications on file prior to visit.        ROS:  All others reviewed and negative.  Objective        PE:  BP 116/68 (BP Location: Right Arm, Patient Position: Sitting, Cuff Size: Large)   Pulse 70   Temp 99.1 F (37.3 C) (Oral)   Ht 5' 8.25" (1.734 m)   Wt 285 lb (129.3 kg)   SpO2 97%   BMI 43.02 kg/m                 Constitutional: Pt appears in NAD               HENT: Head: NCAT.                Right Ear: External ear normal.                 Left Ear: External ear normal.                Eyes: . Pupils are equal, round, and reactive to light. Conjunctivae and EOM are normal               Nose: without d/c or deformity               Neck: Neck supple. Gross normal ROM               Cardiovascular: Normal rate and regular rhythm.                 Pulmonary/Chest: Effort normal and breath sounds without rales or wheezing.                Right index finger DIP with firm 10 mm cystic mass to the posteriormedial aspect with  mild tender, reduced ROM               Neurological: Pt is alert. At baseline orientation, motor grossly intact                Skin: Skin is warm. No rashes, no other new lesions, LE edema - none               Psychiatric: Pt behavior is normal without agitation , not depressed affect  Micro: none  Cardiac tracings I have personally interpreted today:  none  Pertinent Radiological findings (summarize): none   Lab Results  Component Value Date   WBC 6.0 02/08/2021   HGB 14.9 02/08/2021   HCT 42.9 02/08/2021   PLT 173.0 02/08/2021   GLUCOSE 77 09/26/2019   CHOL 190 12/04/2020   TRIG 198 (H) 12/04/2020   HDL 39 (L) 12/04/2020   LDLDIRECT 104.0 08/22/2019   LDLCALC 116 (H) 12/04/2020   ALT 114 (H) 12/04/2020   AST 114 (H) 12/04/2020   NA 135 09/26/2019   K 4.3 09/26/2019   CL 102 09/26/2019   CREATININE 0.90 12/21/2020   BUN 20 09/26/2019   CO2 24 09/26/2019   TSH 2.28 08/22/2019   PSA 1.04 08/22/2019   INR 1.0 02/08/2021   HGBA1C 5.7 08/22/2019   Assessment/Plan:  ZENON LEAF is a 60 y.o. White or Caucasian [1] male with  has a past medical history of Arthritis, Bipolar 1 disorder ([address]), Carotid stenosis, Carpal tunnel syndrome, Cervical spinal stenosis, Colon polyps, Complication of anesthesia, Diverticulitis, Diverticulosis, Heart murmur, History of hiatal hernia, Hyperlipemia, Insomnia, Normal cardiac stress test, Numbness of fingers of both hands, PVC (premature ventricular contraction), Restless leg, and Sleep apnea.  Hyperglycemia Lab Results  Component Value Date   HGBA1C 5.7 08/22/2019   Stable, pt to continue current medical treatment  - diet   Finger pain, right With cystic mass new onset right index finger DIP - for referral hand surgury as likely needs surgical removal  Morbid obesity due to excess calories ([place]) Pt unable to lose wt with diet, exercise - for referral medical wt management  - ? monjouro candidate  Depression Overall stable, declines need for any change in tx or referral for counseling at this time  Followup: Return if symptoms worsen or fail to  improve.  Cathlean Cower, MD 05/22/2021 11:01 PM [facility]

## 2021-05-22 NOTE — Assessment & Plan Note (Signed)
Overall stable, declines need for any change in tx or referral for counseling at this time

## 2021-05-22 NOTE — Assessment & Plan Note (Signed)
With cystic mass new onset right index finger DIP - for referral hand surgury as likely needs surgical removal

## 2021-05-22 NOTE — Assessment & Plan Note (Signed)
Pt unable to lose wt with diet, exercise - for referral medical wt management  - ? monjouro candidate

## 2021-05-22 NOTE — Assessment & Plan Note (Signed)
Lab Results  Component Value Date   HGBA1C 5.7 08/22/2019   Stable, pt to continue current medical treatment  - diet

## 2021-05-24 NOTE — Telephone Encounter (Signed)
Referral, records, demographic and insurance information faxed to Dr. Johnette Abraham.   P: [phone] F: [phone]

## 2021-06-15 ENCOUNTER — Other Ambulatory Visit: Payer: Self-pay

## 2021-06-15 DIAGNOSIS — K760 Fatty (change of) liver, not elsewhere classified: Secondary | ICD-10-CM

## 2021-06-15 DIAGNOSIS — R932 Abnormal findings on diagnostic imaging of liver and biliary tract: Secondary | ICD-10-CM

## 2021-06-21 ENCOUNTER — Telehealth: Payer: Self-pay | Admitting: Gastroenterology

## 2021-06-21 NOTE — Telephone Encounter (Signed)
Message sent to Dr. [name] would will return in the am

## 2021-06-21 NOTE — Telephone Encounter (Signed)
[facility] Radiologist called in to states order for [place] needs physician e-signature for appt scheduled 1/13

## 2021-06-23 ENCOUNTER — Other Ambulatory Visit: Payer: Self-pay

## 2021-06-23 DIAGNOSIS — K760 Fatty (change of) liver, not elsewhere classified: Secondary | ICD-10-CM

## 2021-06-23 DIAGNOSIS — K7460 Unspecified cirrhosis of liver: Secondary | ICD-10-CM

## 2021-06-23 DIAGNOSIS — R932 Abnormal findings on diagnostic imaging of liver and biliary tract: Secondary | ICD-10-CM

## 2021-06-23 NOTE — Progress Notes (Signed)
New order for U/S with elastography since radiology says the other order is not signed.

## 2021-06-23 NOTE — Addendum Note (Signed)
Addended by: Roetta Sessions on: 06/23/2021 01:43 PM   Modules accepted: Orders

## 2021-06-23 NOTE — Telephone Encounter (Signed)
[facility] radiology calling to follow up on previous message. Asked to please complete order

## 2021-06-23 NOTE — Telephone Encounter (Signed)
Order for U/S with Elastography is in order review. Dr. [name] confirmed he signed the order.  Placed another order and asked Dr. Loni Muse to sign again. Called Radiology and they can see the order and have linked it with the appt

## 2021-06-25 ENCOUNTER — Ambulatory Visit (HOSPITAL_COMMUNITY): Payer: 59

## 2021-08-03 ENCOUNTER — Telehealth: Payer: Self-pay

## 2021-08-03 DIAGNOSIS — R932 Abnormal findings on diagnostic imaging of liver and biliary tract: Secondary | ICD-10-CM

## 2021-08-03 DIAGNOSIS — K760 Fatty (change of) liver, not elsewhere classified: Secondary | ICD-10-CM

## 2021-08-03 NOTE — Telephone Encounter (Signed)
-----   Message from Roetta Sessions, [place] sent at 02/11/2021  8:15 AM EDT ----- Regarding: labs Patient due for labs at the end of Feb (CBC, LFTs, AFP, PT/INR)

## 2021-08-03 NOTE — Telephone Encounter (Signed)
Called and spoke to patient. He will go to the lab one day this week or next 

## 2021-08-18 ENCOUNTER — Other Ambulatory Visit (INDEPENDENT_AMBULATORY_CARE_PROVIDER_SITE_OTHER): Payer: 59

## 2021-08-18 DIAGNOSIS — R932 Abnormal findings on diagnostic imaging of liver and biliary tract: Secondary | ICD-10-CM | POA: Diagnosis not present

## 2021-08-18 DIAGNOSIS — Z0000 Encounter for general adult medical examination without abnormal findings: Secondary | ICD-10-CM | POA: Diagnosis not present

## 2021-08-18 DIAGNOSIS — K760 Fatty (change of) liver, not elsewhere classified: Secondary | ICD-10-CM

## 2021-08-18 DIAGNOSIS — R739 Hyperglycemia, unspecified: Secondary | ICD-10-CM

## 2021-08-18 LAB — HEPATIC FUNCTION PANEL
ALT: 94 U/L — ABNORMAL HIGH (ref 0–53)
AST: 57 U/L — ABNORMAL HIGH (ref 0–37)
Albumin: 4.5 g/dL (ref 3.5–5.2)
Alkaline Phosphatase: 46 U/L (ref 39–117)
Bilirubin, Direct: 0.2 mg/dL (ref 0.0–0.3)
Total Bilirubin: 1 mg/dL (ref 0.2–1.2)
Total Protein: 6.9 g/dL (ref 6.0–8.3)

## 2021-08-18 LAB — CBC WITH DIFFERENTIAL/PLATELET
Basophils Absolute: 0 10*3/uL (ref 0.0–0.1)
Basophils Relative: 0.8 % (ref 0.0–3.0)
Eosinophils Absolute: 0.1 10*3/uL (ref 0.0–0.7)
Eosinophils Relative: 2.4 % (ref 0.0–5.0)
HCT: 42.5 % (ref 39.0–52.0)
Hemoglobin: 15.2 g/dL (ref 13.0–17.0)
Lymphocytes Relative: 33.6 % (ref 12.0–46.0)
Lymphs Abs: 1.8 10*3/uL (ref 0.7–4.0)
MCHC: 35.7 g/dL (ref 30.0–36.0)
MCV: 89.8 fl (ref 78.0–100.0)
Monocytes Absolute: 0.3 10*3/uL (ref 0.1–1.0)
Monocytes Relative: 5.6 % (ref 3.0–12.0)
Neutro Abs: 3.1 10*3/uL (ref 1.4–7.7)
Neutrophils Relative %: 57.6 % (ref 43.0–77.0)
Platelets: 173 10*3/uL (ref 150.0–400.0)
RBC: 4.73 Mil/uL (ref 4.22–5.81)
RDW: 13.4 % (ref 11.5–15.5)
WBC: 5.4 10*3/uL (ref 4.0–10.5)

## 2021-08-18 LAB — PSA: PSA: 1.03 ng/mL (ref 0.10–4.00)

## 2021-08-18 LAB — TSH: TSH: 2.96 u[IU]/mL (ref 0.35–5.50)

## 2021-08-18 LAB — URINALYSIS, ROUTINE W REFLEX MICROSCOPIC
Bilirubin Urine: NEGATIVE
Hgb urine dipstick: NEGATIVE
Ketones, ur: NEGATIVE
Leukocytes,Ua: NEGATIVE
Nitrite: NEGATIVE
RBC / HPF: NONE SEEN (ref 0–?)
Specific Gravity, Urine: 1.025 (ref 1.000–1.030)
Total Protein, Urine: NEGATIVE
Urine Glucose: NEGATIVE
Urobilinogen, UA: 0.2 (ref 0.0–1.0)
pH: 5.5 (ref 5.0–8.0)

## 2021-08-18 LAB — LIPID PANEL
Cholesterol: 152 mg/dL (ref 0–200)
HDL: 47.3 mg/dL (ref >39.00)
LDL Cholesterol: 78 mg/dL (ref 0–99)
NonHDL: 104.91
Total CHOL/HDL Ratio: 3
Triglycerides: 134 mg/dL (ref 0.0–149.0)
VLDL: 26.8 mg/dL (ref 0.0–40.0)

## 2021-08-18 LAB — PROTIME-INR
INR: 1 ratio (ref 0.8–1.0)
Prothrombin Time: 11.1 s (ref 9.6–13.1)

## 2021-08-18 LAB — HEMOGLOBIN A1C: Hgb A1c MFr Bld: 6.5 % (ref 4.6–6.5)

## 2021-08-19 LAB — AFP TUMOR MARKER: AFP-Tumor Marker: 4.4 ng/mL (ref <6.1)

## 2021-08-31 ENCOUNTER — Ambulatory Visit (HOSPITAL_COMMUNITY): Payer: 59

## 2021-09-07 ENCOUNTER — Ambulatory Visit (HOSPITAL_COMMUNITY)
Admission: RE | Admit: 2021-09-07 | Discharge: 2021-09-07 | Disposition: A | Discharge status: Home / Self Care | Payer: 59 | Source: Ambulatory Visit | Attending: Gastroenterology | Admitting: Gastroenterology

## 2021-09-07 ENCOUNTER — Other Ambulatory Visit: Payer: Self-pay

## 2021-09-07 DIAGNOSIS — K7460 Unspecified cirrhosis of liver: Secondary | ICD-10-CM | POA: Diagnosis present

## 2021-09-07 DIAGNOSIS — K760 Fatty (change of) liver, not elsewhere classified: Secondary | ICD-10-CM | POA: Diagnosis present

## 2021-09-07 DIAGNOSIS — R932 Abnormal findings on diagnostic imaging of liver and biliary tract: Secondary | ICD-10-CM | POA: Diagnosis present

## 2021-09-30 ENCOUNTER — Encounter: Payer: Self-pay | Admitting: Gastroenterology

## 2021-09-30 ENCOUNTER — Ambulatory Visit: Payer: 59 | Admitting: Gastroenterology

## 2021-09-30 VITALS — BP 118/66 | HR 70 | Ht 68.25 in | Wt 258.0 lb

## 2021-09-30 DIAGNOSIS — Z6838 Body mass index (BMI) 38.0-38.9, adult: Secondary | ICD-10-CM | POA: Diagnosis not present

## 2021-09-30 DIAGNOSIS — K760 Fatty (change of) liver, not elsewhere classified: Secondary | ICD-10-CM | POA: Diagnosis not present

## 2021-09-30 NOTE — Patient Instructions (Addendum)
If you are age 63 or older, your body mass index should be between 23-30. Your Body mass index is 38.94 kg/m?Marland Kitchen If this is out of the aforementioned range listed, please consider follow up with your Primary Care Provider. ? ?If you are age 38 or younger, your body mass index should be between 19-25. Your Body mass index is 38.94 kg/m?Marland Kitchen If this is out of the aformentioned range listed, please consider follow up with your Primary Care Provider.  ? ?________________________________________________________ ? ?The [place] [facility] providers would like to encourage you to use [facility] to communicate with providers for non-urgent requests or questions.  Due to long hold times on the telephone, sending your provider a message by [facility] may be a faster and more efficient way to get a response.  Please allow 48 business hours for a response.  Please remember that this is for non-urgent requests.  ?_______________________________________________________ ? ?You will be due for LFTs in June.  We will remind you when it is time to go to the lab in the basement of your building. ? ?Please follow up in 6 months. ? ?Thank you for entrusting me with your care and for choosing [employer], ?Dr. [place] Cellar ? ? ? ? ?

## 2021-09-30 NOTE — Progress Notes (Signed)
? ?HPI :  ?61 y/o male here for for follow up of fatty liver, obesity. History of colon polyps.  ? ?Recall we have followed him for history of fatty liver, his body mass index have been greater than 40 in recent years.  His AST and ALT had fluctuated in the low 100s.  He has had a serologic work-up that has been negative for other etiologies and he has been vaccinated to hepatitis a and B.  He had previously drank beer, about 2 drinks per day over time.  He has stopped all alcohol.  He had a hard time with weight loss and referred him to Dr. Karen Chafe at [facility] bariatric clinic.  He has been seeing her for the past 5 weeks or so.  He was started on Wegovy, and also seeing nutritionist with their office and he is on a 1000 -calorie/day diet.  He is lost about 20 pounds since being on this regimen.  He states he feels quite well, his joint pains/body aches have resolved.  He is very happy with the progress he has been making with his weight loss so far. ? ?His last blood work was from March of this year where his ALT was 94, AST 57, slightly down from last June where his AST and ALT were in the low 100s.  This value however was just around the time he was starting on his weight loss.  Hemoglobin normal, platelet count normal.  He had an ultrasound with elastography at the end of March which did not show any high risk changes concerning for cirrhosis, risk for fibrosis was thought to be low.  This is in contrast to a prior CT scan he had which suggested underlying cirrhosis of the liver.  His mother had cirrhosis.   ?  ?Endoscopic evaluation: ?Colonoscopy 04/02/14 - 2 mm ascending colon polyp, diverticulosis ?Colonoscopy 03/28/13 - 4 polyps, largest 59m - TA with HGD ?Colonoscopy 05/29/07 - multiple polyps removed, largest 148m?  ?Echo 02/06/19 - EF 60-65% ?  ?[place]/11/21 - gallstones, fatty liver ?  ?CT 10/21/15 - diverticulitis of descending colon ?  ?Colonoscopy 11/26/19 - The perianal and digital rectal  examinations were normal. ?- Two sessile polyps were found in the cecum. The polyps were 4 to 5 mm in size. These ?polyps were removed with a cold snare. Resection and retrieval were complete. ?- A 4 mm polyp was found in the ascending colon. The polyp was sessile. The polyp was ?removed with a cold snare. Resection and retrieval were complete. ?- Three sessile polyps were found in the hepatic flexure. The polyps were 4 to 5 mm in size. ?These polyps were removed with a cold snare. Resection and retrieval were complete. ?- Multiple medium-mouthed diverticula were found in the sigmoid colon, and a few in the ?hepatic flexure. ?- Internal hemorrhoids were found during retroflexion. ?- The exam was otherwise without abnormality. ?  ?Diagnosis ?Surgical [P], colon, hepatic flexure, cecum and ascending, polyp (6) ?- TUBULAR ADENOMA(S) WITHOUT HIGH-GRADE DYSPLASIA OR MALIGNANCY ?- SESSILE SERRATED POLYP(S) WITHOUT CYTOLOGIC DYSPLASIA ?- HYPERPLASTIC POLYP ?  ?  ?[place] 02/04/20 - IMPRESSION: ?1. No acute findings. ?2. Cholelithiasis. No evidence of acute cholecystitis. ?3. Fatty infiltration of the liver. ?   ? ?CT scan abdomen pelvis with contrast 12/21/20: ?IMPRESSION: ?1. No acute findings are noted in the abdomen or pelvis to account ?for the patient's symptoms. Specifically, no evidence of significant ?diverticular disease or acute diverticulitis. ?2. 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 ?Probable hepatic steatosis. ?3. Cholelithiasis without evidence of acute cholecystitis at this ?time. ?4. Aortic atherosclerosis. ?  ? ?[place] with elastography 09/07/21: ?ULTRASOUND HEPATIC ELASTOGRAPHY: ?  ?Median kPa:  7.3 ?  ?Diagnostic category: < or = 9 kPa: in the absence of other known ?clinical signs, rules out cACLD ? ? ?Labs 08/18/21: ?ALT 94, AST 57, AP 46, T bil 1.0 ? ? ?Past Medical History:  ?Diagnosis Date  ? Arthritis   ? Bipolar 1 disorder ([place])   ? Dr Clovis Pu , pt denies  ? Carotid stenosis   ?  Carpal tunnel syndrome   ? saw Dr Kristie Cowman , pt unaware  ? Cervical spinal stenosis   ? Colon polyps   ? adenomatous  ? Complication of anesthesia   ? trouble placing catheter with last surgeery, had to get urologist  ? Diverticulitis   ? Diverticulosis   ? Heart murmur   ? pt denies  ? History of hiatal hernia   ? resolved  ? Hyperlipemia   ? Insomnia   ? related to pain  ? Normal cardiac stress test   ? CP 01-2009---Nl ECHO and stress test neg  ? Numbness of fingers of both hands   ? when sleeps, right hand worse than left  ? PVC (premature ventricular contraction)   ? history of PVC noted while in [place]  ? Restless leg   ? Sleep apnea   ? does not use cpap due to does not like  ? ? ? ?Past Surgical History:  ?Procedure Laterality Date  ? COLONOSCOPY WITH PROPOFOL N/A 04/02/2014  ? Procedure: COLONOSCOPY WITH PROPOFOL;  Surgeon: Inda Castle, MD;  Location: [facility];  Service: Endoscopy;  Laterality: N/A;  ? CYSTOSCOPY WITH URETHRAL DILATATION  12/20/2011  ? Procedure: CYSTOSCOPY WITH URETHRAL DILATATION;  Surgeon: Earnstine Regal, MD;  Location: [facility];  Service: General;;  with foley insertion  ? CYSTOSCOPY WITH URETHRAL DILATATION  12/20/2011  ? Procedure: CYSTOSCOPY WITH URETHRAL DILATATION;  Surgeon: Franchot Gallo, MD;  [facility];  Service: Urology;;  ? CYSTOSCOPY WITH URETHRAL DILATATION N/A 12/15/2017  ? Procedure: CYSTOSCOPY WITH URETHRAL DILATATION;  Surgeon: Irine Seal, MD;  [facility];  Service: Urology;  Laterality: N/A;  ? HOT HEMOSTASIS N/A 04/02/2014  ? Procedure: HOT HEMOSTASIS (ARGON PLASMA COAGULATION/BICAP);  Surgeon: Inda Castle, MD;  Location: [facility];  Service: Endoscopy;  Laterality: N/A;  ? maxillofacial surgery    ? PARTIAL COLECTOMY  12/20/2011  ? Procedure: PARTIAL COLECTOMY;  Surgeon: Earnstine Regal, MD;  [facility]: [facility];  Service: General;  Laterality: N/A;  Sigmoid colectomy  ? right knee arthroscopy    ? 2007 x2  ? right shoulder surgery    ? X2  ? SHOULDER  SURGERY    ? LEFT  ? TOTAL KNEE ARTHROPLASTY Left 12/15/2017  ? Procedure: LEFT TOTAL KNEE ARTHROPLASTY;  Surgeon: Sydnee Cabal, MD;  [facility]: [facility];  Service: Orthopedics;  Laterality: Left;  Adductor Block  ? TOTAL KNEE ARTHROPLASTY Right 05/18/2018  ? Procedure: TOTAL KNEE ARTHROPLASTY;  Surgeon: Sydnee Cabal, MD;  [facility];  Service: Orthopedics;  Laterality: Right;  with block  ? ?Family History  ?Problem Relation Age of Onset  ? Prostate cancer Paternal Grandfather   ? Cancer Paternal Aunt   ?     lymph cancer  ? Liver disease Mother   ? Alcohol abuse Father   ? Alcohol abuse Sister   ? Colon  cancer Neg Hx   ? CAD Neg Hx   ? Pancreatic cancer Neg Hx   ? Rectal cancer Neg Hx   ? Stomach cancer Neg Hx   ? Drug abuse Neg Hx   ? Anxiety disorder Neg Hx   ? Bipolar disorder Neg Hx   ? Depression Neg Hx   ? ?Social History  ? ?Tobacco Use  ? Smoking status: Former  ?  Types: Cigars  ?  Quit date: 06/13/2018  ?  Years since quitting: 3.3  ? Smokeless tobacco: Former  ?  Types: Snuff  ?  Quit date: 12/10/2017  ? Tobacco comments:  ?  1 per week  ?Vaping Use  ? Vaping Use: Never used  ?Substance Use Topics  ? Alcohol use: Yes  ?  Comment:  a couple beers a few times a week   ? Drug use: No  ?  Comment: denies THC, cocainse, stimulants, pain meds, benzos, synthetic drugs  ? ?Current Outpatient Medications  ?Medication Sig Dispense Refill  ? albuterol (VENTOLIN HFA) 108 (90 Base) MCG/ACT inhaler Inhale 2 puffs into the lungs every 6 (six) hours as needed for wheezing or shortness of breath. 8 g 0  ? Alirocumab (PRALUENT) 150 MG/ML SOAJ Inject 150 mg into the skin every 14 (fourteen) days. 2 mL 11  ? Semaglutide-Weight Management (WEGOVY [place]) Inject 1 application. into the skin once a week.    ? ?No current facility-administered medications for this visit.  ? ?Allergies  ?Allergen Reactions  ? Ciprofloxacin Shortness Of Breath, Nausea And Vomiting, Nausea Only, Rash and Other (See Comments)  ?  Marked mucous  production  ? Dilaudid [Hydromorphone Hcl] Shortness Of Breath, Nausea Only and Other (See Comments)  ?  Patient developed rash, excessive mucous production and chest pain ?- tolerates morphine  ? Hydrocodo

## 2021-10-19 ENCOUNTER — Encounter: Payer: Self-pay | Admitting: Internal Medicine

## 2021-12-15 ENCOUNTER — Encounter: Payer: Self-pay | Admitting: Internal Medicine

## 2021-12-16 ENCOUNTER — Encounter: Payer: Self-pay | Admitting: Gastroenterology

## 2021-12-22 ENCOUNTER — Other Ambulatory Visit (INDEPENDENT_AMBULATORY_CARE_PROVIDER_SITE_OTHER): Payer: 59

## 2021-12-22 DIAGNOSIS — Z6838 Body mass index (BMI) 38.0-38.9, adult: Secondary | ICD-10-CM | POA: Diagnosis not present

## 2021-12-22 DIAGNOSIS — Z0000 Encounter for general adult medical examination without abnormal findings: Secondary | ICD-10-CM | POA: Diagnosis not present

## 2021-12-22 DIAGNOSIS — K760 Fatty (change of) liver, not elsewhere classified: Secondary | ICD-10-CM | POA: Diagnosis not present

## 2021-12-22 LAB — CBC WITH DIFFERENTIAL/PLATELET
Basophils Absolute: 0 10*3/uL (ref 0.0–0.1)
Basophils Relative: 0.6 % (ref 0.0–3.0)
Eosinophils Absolute: 0.1 10*3/uL (ref 0.0–0.7)
Eosinophils Relative: 2 % (ref 0.0–5.0)
HCT: 45.2 % (ref 39.0–52.0)
Hemoglobin: 15.6 g/dL (ref 13.0–17.0)
Lymphocytes Relative: 32.1 % (ref 12.0–46.0)
Lymphs Abs: 2.2 10*3/uL (ref 0.7–4.0)
MCHC: 34.5 g/dL (ref 30.0–36.0)
MCV: 91.5 fl (ref 78.0–100.0)
Monocytes Absolute: 0.4 10*3/uL (ref 0.1–1.0)
Monocytes Relative: 6.3 % (ref 3.0–12.0)
Neutro Abs: 4.1 10*3/uL (ref 1.4–7.7)
Neutrophils Relative %: 59 % (ref 43.0–77.0)
Platelets: 217 10*3/uL (ref 150.0–400.0)
RBC: 4.94 Mil/uL (ref 4.22–5.81)
RDW: 13.3 % (ref 11.5–15.5)
WBC: 6.9 10*3/uL (ref 4.0–10.5)

## 2021-12-22 LAB — HEPATIC FUNCTION PANEL
ALT: 36 U/L (ref 0–53)
AST: 25 U/L (ref 0–37)
Albumin: 4.5 g/dL (ref 3.5–5.2)
Alkaline Phosphatase: 44 U/L (ref 39–117)
Bilirubin, Direct: 0.2 mg/dL (ref 0.0–0.3)
Total Bilirubin: 0.8 mg/dL (ref 0.2–1.2)
Total Protein: 7.3 g/dL (ref 6.0–8.3)

## 2021-12-27 ENCOUNTER — Other Ambulatory Visit: Payer: Self-pay

## 2021-12-27 DIAGNOSIS — K760 Fatty (change of) liver, not elsewhere classified: Secondary | ICD-10-CM

## 2021-12-27 DIAGNOSIS — R932 Abnormal findings on diagnostic imaging of liver and biliary tract: Secondary | ICD-10-CM

## 2021-12-27 NOTE — Progress Notes (Signed)
Due for LFTs in 6-9 months (around February)

## 2022-02-17 DIAGNOSIS — M25551 Pain in right hip: Secondary | ICD-10-CM | POA: Insufficient documentation

## 2022-03-24 ENCOUNTER — Encounter: Payer: Self-pay | Admitting: Internal Medicine

## 2022-03-24 ENCOUNTER — Ambulatory Visit (INDEPENDENT_AMBULATORY_CARE_PROVIDER_SITE_OTHER): Payer: 59 | Admitting: Internal Medicine

## 2022-03-24 ENCOUNTER — Other Ambulatory Visit: Payer: Self-pay | Admitting: Internal Medicine

## 2022-03-24 VITALS — BP 130/80 | HR 85 | Temp 98.1°F | Ht 68.0 in | Wt 230.0 lb

## 2022-03-24 DIAGNOSIS — E559 Vitamin D deficiency, unspecified: Secondary | ICD-10-CM

## 2022-03-24 DIAGNOSIS — R739 Hyperglycemia, unspecified: Secondary | ICD-10-CM

## 2022-03-24 DIAGNOSIS — Z0001 Encounter for general adult medical examination with abnormal findings: Secondary | ICD-10-CM | POA: Diagnosis not present

## 2022-03-24 DIAGNOSIS — E538 Deficiency of other specified B group vitamins: Secondary | ICD-10-CM | POA: Diagnosis not present

## 2022-03-24 DIAGNOSIS — Z23 Encounter for immunization: Secondary | ICD-10-CM | POA: Diagnosis not present

## 2022-03-24 DIAGNOSIS — E7800 Pure hypercholesterolemia, unspecified: Secondary | ICD-10-CM

## 2022-03-24 DIAGNOSIS — I6523 Occlusion and stenosis of bilateral carotid arteries: Secondary | ICD-10-CM

## 2022-03-24 DIAGNOSIS — R9431 Abnormal electrocardiogram [ECG] [EKG]: Secondary | ICD-10-CM | POA: Diagnosis not present

## 2022-03-24 DIAGNOSIS — Z125 Encounter for screening for malignant neoplasm of prostate: Secondary | ICD-10-CM

## 2022-03-24 LAB — BASIC METABOLIC PANEL
BUN: 20 mg/dL (ref 6–23)
CO2: 26 mEq/L (ref 19–32)
Calcium: 10.2 mg/dL (ref 8.4–10.5)
Chloride: 100 mEq/L (ref 96–112)
Creatinine, Ser: 1 mg/dL (ref 0.40–1.50)
GFR: 80.47 mL/min (ref >60.00)
Glucose, Bld: 86 mg/dL (ref 70–99)
Potassium: 4.3 mEq/L (ref 3.5–5.1)
Sodium: 136 mEq/L (ref 135–145)

## 2022-03-24 LAB — CBC WITH DIFFERENTIAL/PLATELET
Basophils Absolute: 0.1 10*3/uL (ref 0.0–0.1)
Basophils Relative: 0.7 % (ref 0.0–3.0)
Eosinophils Absolute: 0.1 10*3/uL (ref 0.0–0.7)
Eosinophils Relative: 1.4 % (ref 0.0–5.0)
HCT: 45.8 % (ref 39.0–52.0)
Hemoglobin: 16 g/dL (ref 13.0–17.0)
Lymphocytes Relative: 26.1 % (ref 12.0–46.0)
Lymphs Abs: 1.9 10*3/uL (ref 0.7–4.0)
MCHC: 35 g/dL (ref 30.0–36.0)
MCV: 91.5 fl (ref 78.0–100.0)
Monocytes Absolute: 0.4 10*3/uL (ref 0.1–1.0)
Monocytes Relative: 5.6 % (ref 3.0–12.0)
Neutro Abs: 4.9 10*3/uL (ref 1.4–7.7)
Neutrophils Relative %: 66.2 % (ref 43.0–77.0)
Platelets: 224 10*3/uL (ref 150.0–400.0)
RBC: 5 Mil/uL (ref 4.22–5.81)
RDW: 13.8 % (ref 11.5–15.5)
WBC: 7.4 10*3/uL (ref 4.0–10.5)

## 2022-03-24 LAB — TSH: TSH: 1.62 u[IU]/mL (ref 0.35–5.50)

## 2022-03-24 LAB — LIPID PANEL
Cholesterol: 228 mg/dL — ABNORMAL HIGH (ref 0–200)
HDL: 69.4 mg/dL (ref >39.00)
LDL Cholesterol: 143 mg/dL — ABNORMAL HIGH (ref 0–99)
NonHDL: 158.3
Total CHOL/HDL Ratio: 3
Triglycerides: 79 mg/dL (ref 0.0–149.0)
VLDL: 15.8 mg/dL (ref 0.0–40.0)

## 2022-03-24 LAB — HEPATIC FUNCTION PANEL
ALT: 33 U/L (ref 0–53)
AST: 24 U/L (ref 0–37)
Albumin: 4.6 g/dL (ref 3.5–5.2)
Alkaline Phosphatase: 40 U/L (ref 39–117)
Bilirubin, Direct: 0.2 mg/dL (ref 0.0–0.3)
Total Bilirubin: 1.1 mg/dL (ref 0.2–1.2)
Total Protein: 7.3 g/dL (ref 6.0–8.3)

## 2022-03-24 LAB — URINALYSIS, ROUTINE W REFLEX MICROSCOPIC
Bilirubin Urine: NEGATIVE
Hgb urine dipstick: NEGATIVE
Ketones, ur: NEGATIVE
Leukocytes,Ua: NEGATIVE
Nitrite: NEGATIVE
RBC / HPF: NONE SEEN (ref 0–?)
Specific Gravity, Urine: 1.015 (ref 1.000–1.030)
Total Protein, Urine: NEGATIVE
Urine Glucose: NEGATIVE
Urobilinogen, UA: 0.2 (ref 0.0–1.0)
pH: 6.5 (ref 5.0–8.0)

## 2022-03-24 LAB — PSA: PSA: 0.71 ng/mL (ref 0.10–4.00)

## 2022-03-24 LAB — VITAMIN D 25 HYDROXY (VIT D DEFICIENCY, FRACTURES): VITD: 43.3 ng/mL (ref 30.00–100.00)

## 2022-03-24 LAB — VITAMIN B12: Vitamin B-12: 313 pg/mL (ref 211–911)

## 2022-03-24 LAB — HEMOGLOBIN A1C: Hgb A1c MFr Bld: 5.7 % (ref 4.6–6.5)

## 2022-03-24 NOTE — Progress Notes (Signed)
Patient ID: Harry Carroll, male   DOB: 09/01/1960, 61 y.o.   MRN: [id number]         Chief Complaint:: wellness exam and carotid stenosis, hld, hyperglycemia       HPI:  Harry Carroll is a 61 y.o. male here for wellness exam; for flu shot today,, declines covid booster, o/w up to date                Also pt hoping LFTs are improved with recent wt loss.  Pt reports last GI eval with improved fatty liver and risk of cirrhosis.  Lost wt with wegovy since Sep 04 2021.  No longer needs CPAP.  Working with [facility] wt loss clinic, working towards maintenance with diet and lower dose wegovy.  Asks for f/u carotid studies, also interested in statin now (was prior on praluent due to fatty liver), and card cT score.  Due for flu shot   Pt denies chest pain, increased sob or doe, wheezing, orthopnea, PND, increased LE swelling, palpitations, dizziness or syncope.   Pt denies polydipsia, polyuria, or new focal neuro s/s.   Pt denies fever, wt loss, night sweats, loss of appetite, or other constitutional symptoms   BP Readings from Last 3 Encounters:  03/24/22 130/80  09/30/21 118/66  05/21/21 116/68   Immunization History  Administered Date(s) Administered   Hep A / Hep B 10/31/2019, 12/09/2019, 05/11/2020   Influenza Split 03/10/2011   Influenza,inj,Quad PF,6+ Mos 04/13/2013, 03/28/2016, 04/21/2017, 02/05/2019, 03/16/2020, 03/24/2022   Influenza-Unspecified 03/13/2018   PFIZER(Purple Top)SARS-COV-2 Vaccination 09/19/2019, 10/15/2019, 04/27/2020   Pneumococcal Polysaccharide-23 11/24/2011   Td 06/14/1999, 01/29/2010   Td (Adult), 2 Lf Tetanus Toxid, Preservative Free 06/14/1999, 01/29/2010   Tdap 04/26/2019   Zoster Recombinat (Shingrix) 06/26/2019, 08/22/2019   There are no preventive care reminders to display for this patient.     Past Medical History:  Diagnosis Date   Arthritis    Bipolar 1 disorder ([place])    Dr Clovis Pu , pt denies   Carotid stenosis    Carpal tunnel syndrome    saw Dr Kristie Cowman ,  pt unaware   Cervical spinal stenosis    Colon polyps    adenomatous   Complication of anesthesia    trouble placing catheter with last surgeery, had to get urologist   Diverticulitis    Diverticulosis    Heart murmur    pt denies   History of hiatal hernia    resolved   Hyperlipemia    Insomnia    related to pain   Normal cardiac stress test    CP 01-2009---Nl ECHO and stress test neg   Numbness of fingers of both hands    when sleeps, right hand worse than left   PVC (premature ventricular contraction)    history of PVC noted while in [place]   Restless leg    Sleep apnea    does not use cpap due to does not like   Past Surgical History:  Procedure Laterality Date   COLONOSCOPY WITH PROPOFOL N/A 04/02/2014   Procedure: COLONOSCOPY WITH PROPOFOL;  Surgeon: Inda Castle, MD;  [facility];  Service: Endoscopy;  Laterality: N/A;   CYSTOSCOPY WITH URETHRAL DILATATION  12/20/2011   Procedure: CYSTOSCOPY WITH URETHRAL DILATATION;  Surgeon: Earnstine Regal, MD;  [facility];  Service: General;;  with foley insertion   CYSTOSCOPY WITH URETHRAL DILATATION  12/20/2011   Procedure: CYSTOSCOPY WITH URETHRAL DILATATION;  Surgeon: Franchot Gallo, MD;  Location:  [facility];  Service: Urology;;   CYSTOSCOPY WITH URETHRAL DILATATION N/A 12/15/2017   Procedure: CYSTOSCOPY WITH URETHRAL DILATATION;  Surgeon: Irine Seal, MD;  [facility];  Service: Urology;  Laterality: N/A;   HOT HEMOSTASIS N/A 04/02/2014   Procedure: HOT HEMOSTASIS (ARGON PLASMA COAGULATION/BICAP);  Surgeon: Inda Castle, MD;  Location: [facility];  Service: Endoscopy;  Laterality: N/A;   maxillofacial surgery     PARTIAL COLECTOMY  12/20/2011   Procedure: PARTIAL COLECTOMY;  Surgeon: Earnstine Regal, MD;  [facility];  Service: General;  Laterality: N/A;  Sigmoid colectomy   right knee arthroscopy     2007 x2   right shoulder surgery     X2   SHOULDER SURGERY     LEFT   TOTAL KNEE ARTHROPLASTY Left  12/15/2017   Procedure: LEFT TOTAL KNEE ARTHROPLASTY;  Surgeon: Sydnee Cabal, MD;  [facility];  Service: Orthopedics;  Laterality: Left;  Adductor Block   TOTAL KNEE ARTHROPLASTY Right 05/18/2018   Procedure: TOTAL KNEE ARTHROPLASTY;  Surgeon: Sydnee Cabal, MD;  [facility];  Service: Orthopedics;  Laterality: Right;  with block    reports that he quit smoking about 3 years ago. His smoking use included cigars. He quit smokeless tobacco use about 4 years ago.  His smokeless tobacco use included snuff. He reports current alcohol use. He reports that he does not use drugs. family history includes Alcohol abuse in his father and sister; Cancer in his paternal aunt; Liver disease in his mother; Prostate cancer in his paternal grandfather. Allergies  Allergen Reactions   Ciprofloxacin Shortness Of Breath, Nausea And Vomiting, Nausea Only, Rash and Other (See Comments)    Marked mucous production   Dilaudid [Hydromorphone Hcl] Shortness Of Breath, Nausea Only and Other (See Comments)    Patient developed rash, excessive mucous production and chest pain - tolerates morphine   Hydrocodone Itching   Current Outpatient Medications on File Prior to Visit  Medication Sig Dispense Refill   Semaglutide-Weight Management (WEGOVY [place]) Inject 1 application. into the skin once a week.     albuterol (VENTOLIN HFA) 108 (90 Base) MCG/ACT inhaler Inhale 2 puffs into the lungs every 6 (six) hours as needed for wheezing or shortness of breath. (Patient not taking: Reported on 03/24/2022) 8 g 0   Alirocumab (PRALUENT) 150 MG/ML SOAJ Inject 150 mg into the skin every 14 (fourteen) days. (Patient not taking: Reported on 03/24/2022) 2 mL 11   No current facility-administered medications on file prior to visit.        ROS:  All others reviewed and negative.  Objective        PE:  BP 130/80 (BP Location: Right Arm, Patient Position: Sitting, Cuff Size: Normal)   Pulse 85   Temp 98.1 F (36.7 C) (Oral)    Ht '5\' 8"'$  (1.727 m)   Wt 230 lb (104.3 kg)   SpO2 99%   BMI 34.97 kg/m                 Constitutional: Pt appears in NAD               HENT: Head: NCAT.                Right Ear: External ear normal.                 Left Ear: External ear normal.                Eyes: .  Pupils are equal, round, and reactive to light. Conjunctivae and EOM are normal               Nose: without d/c or deformity               Neck: Neck supple. Gross normal ROM               Cardiovascular: Normal rate and regular rhythm.                 Pulmonary/Chest: Effort normal and breath sounds without rales or wheezing.                Abd:  Soft, NT, ND, + BS, no organomegaly               Neurological: Pt is alert. At baseline orientation, motor grossly intact               Skin: Skin is warm. No rashes, no other new lesions, LE edema - none               Psychiatric: Pt behavior is normal without agitation   Micro: none  Cardiac tracings I have personally interpreted today:  none  Pertinent Radiological findings (summarize): none   Lab Results  Component Value Date   WBC 6.9 12/22/2021   HGB 15.6 12/22/2021   HCT 45.2 12/22/2021   PLT 217.0 12/22/2021   GLUCOSE 77 09/26/2019   CHOL 152 08/18/2021   TRIG 134.0 08/18/2021   HDL 47.30 08/18/2021   LDLDIRECT 104.0 08/22/2019   LDLCALC 78 08/18/2021   ALT 36 12/22/2021   AST 25 12/22/2021   NA 135 09/26/2019   K 4.3 09/26/2019   CL 102 09/26/2019   CREATININE 0.90 12/21/2020   BUN 20 09/26/2019   CO2 24 09/26/2019   TSH 2.96 08/18/2021   PSA 1.03 08/18/2021   INR 1.0 08/18/2021   HGBA1C 6.5 08/18/2021   Assessment/Plan:  Harry Carroll is a 61 y.o. White or Caucasian [1] male with  has a past medical history of Arthritis, Bipolar 1 disorder ([place]), Carotid stenosis, Carpal tunnel syndrome, Cervical spinal stenosis, Colon polyps, Complication of anesthesia, Diverticulitis, Diverticulosis, Heart murmur, History of hiatal hernia, Hyperlipemia,  Insomnia, Normal cardiac stress test, Numbness of fingers of both hands, PVC (premature ventricular contraction), Restless leg, and Sleep apnea.  Encounter for well adult exam with abnormal findings Age and sex appropriate education and counseling updated with regular exercise and diet Referrals for preventative services - none needed Immunizations addressed - for flu shot, declines covid booster Smoking counseling  - none needed Evidence for depression or other mood disorder - none significant Most recent labs reviewed. I have personally reviewed and have noted: 1) the patient's medical and social history 2) The patient's current medications and supplements 3) The patient's height, weight, and BMI have been recorded in the chart   Hyperlipidemia Lab Results  Component Value Date   [place] 78 08/18/2021   Unocntrolled, goal ldl < 70, no longer on praluent, for f/u lipids, consider statin, also for Cardiac CT score testing   Hyperglycemia Lab Results  Component Value Date   HGBA1C 6.5 08/18/2021   Stable, pt to continue current medical treatment  - diet, wt control, wegovy   Bilateral carotid artery stenosis Also for f/u carotid study per pt request  Followup: Return in about 1 year (around 03/25/2023).  Cathlean Cower, MD 03/24/2022 10:44 AM [facility] -  [facility] Internal Medicine

## 2022-03-24 NOTE — Assessment & Plan Note (Signed)
Lab Results  Component Value Date   HGBA1C 6.5 08/18/2021   Stable, pt to continue current medical treatment  - diet, wt control, wegovy

## 2022-03-24 NOTE — Assessment & Plan Note (Signed)
Age and sex appropriate education and counseling updated with regular exercise and diet Referrals for preventative services - none needed Immunizations addressed - for flu shot, declines covid booster Smoking counseling  - none needed Evidence for depression or other mood disorder - none significant Most recent labs reviewed. I have personally reviewed and have noted: 1) the patient's medical and social history 2) The patient's current medications and supplements 3) The patient's height, weight, and BMI have been recorded in the chart

## 2022-03-24 NOTE — Assessment & Plan Note (Signed)
Lab Results  Component Value Date   [place] 78 08/18/2021   Unocntrolled, goal ldl < 70, no longer on praluent, for f/u lipids, consider statin, also for Cardiac CT score testing

## 2022-03-24 NOTE — Assessment & Plan Note (Signed)
Also for f/u carotid study per pt request

## 2022-03-24 NOTE — Patient Instructions (Signed)
You had the flu shot today  Please continue all other medications as before, and refills have been done if requested.  Please have the pharmacy call with any other refills you may need.  Please continue your efforts at being more active, low cholesterol diet, and weight control.  You are otherwise up to date with prevention measures today.  Please keep your appointments with your specialists as you may have planned  You will be contacted regarding the referral for: Carotid artery ultrasound, and Cardiac CT Score  Please go to the LAB at the blood drawing area for the tests to be done  You will be contacted by phone if any changes need to be made immediately.  Otherwise, you will receive a letter about your results with an explanation, but please check with MyChart first.  Please remember to sign up for MyChart if you have not done so, as this will be important to you in the future with finding out test results, communicating by private email, and scheduling acute appointments online when needed.  Please make an Appointment to return for your 1 year visit, or sooner if needed

## 2022-03-28 ENCOUNTER — Encounter: Payer: Self-pay | Admitting: Cardiology

## 2022-03-28 ENCOUNTER — Other Ambulatory Visit: Payer: Self-pay | Admitting: Internal Medicine

## 2022-03-28 ENCOUNTER — Ambulatory Visit
Admission: RE | Admit: 2022-03-28 | Discharge: 2022-03-28 | Disposition: A | Discharge status: Home / Self Care | Payer: 59 | Source: Ambulatory Visit | Attending: Internal Medicine | Admitting: Internal Medicine

## 2022-03-28 DIAGNOSIS — E7800 Pure hypercholesterolemia, unspecified: Secondary | ICD-10-CM

## 2022-03-28 DIAGNOSIS — R9431 Abnormal electrocardiogram [ECG] [EKG]: Secondary | ICD-10-CM

## 2022-03-28 DIAGNOSIS — I6523 Occlusion and stenosis of bilateral carotid arteries: Secondary | ICD-10-CM

## 2022-03-28 DIAGNOSIS — R739 Hyperglycemia, unspecified: Secondary | ICD-10-CM

## 2022-03-28 DIAGNOSIS — R931 Abnormal findings on diagnostic imaging of heart and coronary circulation: Secondary | ICD-10-CM

## 2022-03-28 MED ORDER — ROSUVASTATIN CALCIUM 20 MG PO TABS: 20.0000 mg | ORAL_TABLET | Freq: Every day | ORAL | 3 refills | Status: DC

## 2022-03-28 MED ORDER — ASPIRIN 81 MG PO TBEC: 81.0000 mg | DELAYED_RELEASE_TABLET | Freq: Every day | ORAL | 12 refills | Status: AC

## 2022-04-05 ENCOUNTER — Ambulatory Visit (HOSPITAL_COMMUNITY)
Admission: RE | Admit: 2022-04-05 | Discharge: 2022-04-05 | Disposition: A | Discharge status: Home / Self Care | Payer: 59 | Source: Ambulatory Visit | Attending: Cardiology | Admitting: Cardiology

## 2022-04-05 DIAGNOSIS — I6523 Occlusion and stenosis of bilateral carotid arteries: Secondary | ICD-10-CM | POA: Insufficient documentation

## 2022-04-12 ENCOUNTER — Encounter: Payer: Self-pay | Admitting: Internal Medicine

## 2022-04-12 DIAGNOSIS — F688 Other specified disorders of adult personality and behavior: Secondary | ICD-10-CM

## 2022-04-12 DIAGNOSIS — U099 Post covid-19 condition, unspecified: Secondary | ICD-10-CM

## 2022-04-12 NOTE — Progress Notes (Unsigned)
Cardiology Office Note:    Date:  04/13/2022   ID:  [name], DOB 20-Sep-1960, MRN [id number]  PCP:  Biagio Borg, MD [place] Cardiologist: Kirk Ruths, MD   Reason for visit: Elevated coronary calcium score  History of Present Illness:    Harry Carroll is a 61 y.o. male with a hx of carotid stenosis, hyperlipidemia, severe case of COVID, prior tobacco use quit in 2020, fatty liver with persistent elevated LTFs, OSA (not on CAP) and alcohol use.  Echo 2020 EF 60 to 65%, no significant valve disease.  He last saw Dr. Stanford Breed in November 2021 -doing well.  He saw Raquel Rodriguez-Guzan in 08/2020.  With plan to resume Nexlizet (patient admitted missing doses), resume plant based diet, and work on reducing alcohol intake.  He was encouraged to see GI specialist for fatty liver diagnosis.  This Patient's LDL remained above goal, therefore Nexilant was discontinued and patient started on Praluent.  Coronary calcium score October 2023 showed three-vessel coronary artery calcification, score of 680, 91st percentile.  Today, he denies exertional chest pain or shortness of breath.  A couple months ago, he had 1 episode of a sharp pain in the middle of the night that was in his left chest and radiated to his arm, described as sharp/stabbing/pressure.  Not associated with shortness of breath.  Resolved on its own.  No recurrence.  He states he has done well with weight loss.  Previously he was at 285 pounds and then was referred to the [facility] weight loss center in March 2023.  Since then he has lost 50 to 60 pounds.  He is currently on Optifast.  He is now off when [facility].  He has successfully changed his diet habits.  He has stopped drinking.  Previously he has suspected early stage cirrhosis.  His LFTs have now normalized.   He also denies PND, orthopnea, LE edema, lightheadedness, syncope, palpitations.  He no longer has snoring and therefore thinks his sleep apnea has  resolved with weight loss.  He does have a Kardia device which is always picked up normal sinus rhythm.  He was previously taking Praluent but states when it was due for a new prescription/approval, he never followed up.  When deciding between Crestor and Praluent, patient prefers to use Praluent with his history of elevated liver enzymes.  He also prefers not to take a daily medication and states he was consistent about Praluent use previously.    Past Medical History:  Diagnosis Date   Arthritis    Bipolar 1 disorder ([place])    Dr Clovis Pu , pt denies   Carotid stenosis    Carpal tunnel syndrome    saw Dr Kristie Cowman , pt unaware   Cervical spinal stenosis    Colon polyps    adenomatous   Complication of anesthesia    trouble placing catheter with last surgeery, had to get urologist   Diverticulitis    Diverticulosis    Heart murmur    pt denies   History of hiatal hernia    resolved   Hyperlipemia    Insomnia    related to pain   Normal cardiac stress test    CP 01-2009---Nl ECHO and stress test neg   Numbness of fingers of both hands    when sleeps, right hand worse than left   PVC (premature ventricular contraction)    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   Restless leg  Sleep apnea    does not use cpap due to does not like    Past Surgical History:  Procedure Laterality Date   COLONOSCOPY WITH PROPOFOL N/A 04/02/2014   Procedure: COLONOSCOPY WITH PROPOFOL;  Surgeon: Inda Castle, MD;  Location: [facility];  Service: Endoscopy;  Laterality: N/A;   CYSTOSCOPY WITH URETHRAL DILATATION  12/20/2011   Procedure: CYSTOSCOPY WITH URETHRAL DILATATION;  Surgeon: Earnstine Regal, MD;  Location: [facility];  Service: General;;  with foley insertion   CYSTOSCOPY WITH URETHRAL DILATATION  12/20/2011   Procedure: CYSTOSCOPY WITH URETHRAL DILATATION;  Surgeon: Franchot Gallo, MD;  Location: [facility];  Service: Urology;;   CYSTOSCOPY WITH URETHRAL DILATATION N/A 12/15/2017   Procedure:  CYSTOSCOPY WITH URETHRAL DILATATION;  Surgeon: Irine Seal, MD;  Location: [facility];  Service: Urology;  Laterality: N/A;   HOT HEMOSTASIS N/A 04/02/2014   Procedure: HOT HEMOSTASIS (ARGON PLASMA COAGULATION/BICAP);  Surgeon: Inda Castle, MD;  Location: [facility];  Service: Endoscopy;  Laterality: N/A;   maxillofacial surgery     PARTIAL COLECTOMY  12/20/2011   Procedure: PARTIAL COLECTOMY;  Surgeon: Earnstine Regal, MD;  Location: [facility];  Service: General;  Laterality: N/A;  Sigmoid colectomy   right knee arthroscopy     2007 x2   right shoulder surgery     X2   SHOULDER SURGERY     LEFT   TOTAL KNEE ARTHROPLASTY Left 12/15/2017   Procedure: LEFT TOTAL KNEE ARTHROPLASTY;  Surgeon: Sydnee Cabal, MD;  Location: [facility];  Service: Orthopedics;  Laterality: Left;  Adductor Block   TOTAL KNEE ARTHROPLASTY Right 05/18/2018   Procedure: TOTAL KNEE ARTHROPLASTY;  Surgeon: Sydnee Cabal, MD;  Location: [facility];  Service: Orthopedics;  Laterality: Right;  with block    Current Medications: Current Meds  Medication Sig   aspirin EC 81 MG tablet Take 1 tablet (81 mg total) by mouth daily. Swallow whole.     Allergies:   Ciprofloxacin, Dilaudid [hydromorphone hcl], and Hydrocodone   Social History   Socioeconomic History   Marital status: Married    Spouse name: Not on file   Number of children: 4   Years of education: 14   Highest education level: Not on file  Occupational History   Occupation: Software    Employer: [employer]  Tobacco Use   Smoking status: Former    Types: Cigars    Quit date: 06/13/2018    Years since quitting: 3.8   Smokeless tobacco: Former    Types: Snuff    Quit date: 12/10/2017   Tobacco comments:    1 per week  Vaping Use   Vaping Use: Never used  Substance and Sexual Activity   Alcohol use: Yes    Comment:  a couple beers a few times a week    Drug use: No    Comment: denies THC, cocainse, stimulants, pain meds, benzos, synthetic drugs    Sexual activity: Not Currently  Other Topics Concern   Not on file  Social History Narrative   Born in [place] and grew up in [place]. Currently resides in a house with his wife. 1 dog, 1 snake, 1 cat. 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    Denies religious beliefs that would effect health care.    Social Determinants of Health   Financial Resource Strain: Not on file  Food Insecurity: Not on file  Transportation Needs: Not on file  Physical Activity: Not on file  Stress: Not on file  Social Connections: Not on file     Family History: The patient's family history includes Alcohol abuse in his father and sister; Cancer in his paternal aunt; Liver disease in his mother; Prostate cancer in his paternal grandfather. There is no history of Colon cancer, CAD, Pancreatic cancer, Rectal cancer, Stomach cancer, Drug abuse, Anxiety disorder, Bipolar disorder, or Depression.  ROS:   Please see the history of present illness.     EKGs/Labs/Other Studies Reviewed:    Recent Labs: 03/24/2022: ALT 33; BUN 20; Creatinine, Ser 1.00; Hemoglobin 16.0; Platelets 224.0; Potassium 4.3; Sodium 136; TSH 1.62   Recent Lipid Panel Lab Results  Component Value Date/Time   CHOL 228 (H) 03/24/2022 10:44 AM   CHOL 190 12/04/2020 11:32 AM   TRIG 79.0 03/24/2022 10:44 AM   TRIG 118 03/30/2006 09:06 AM   HDL 69.40 03/24/2022 10:44 AM   HDL 39 (L) 12/04/2020 11:32 AM   LDLCALC 143 (H) 03/24/2022 10:44 AM   LDLCALC 116 (H) 12/04/2020 11:32 AM   LDLDIRECT 104.0 08/22/2019 01:56 PM    Physical Exam:    VS:  BP 126/84   Pulse 77   Ht '5\' 9"'$  (1.753 m)   Wt 236 lb 3.2 oz (107.1 kg)   SpO2 95%   BMI 34.88 kg/m    No data found.       Wt Readings from Last 3 Encounters:  04/13/22 236 lb 3.2 oz (107.1 kg)  03/24/22 230 lb (104.3 kg)  09/30/21 258 lb (117 kg)     GEN:  Well nourished, well developed in no acute distress HEENT: Normal NECK: No JVD; No carotid bruits CARDIAC: RRR, no murmurs,  rubs, gallops RESPIRATORY:  Clear to auscultation without rales, wheezing or rhonchi  ABDOMEN: Soft, non-tender, non-distended MUSCULOSKELETAL: No edema SKIN: Warm and dry NEUROLOGIC:  Alert and oriented PSYCHIATRIC:  Normal affect     ASSESSMENT AND PLAN   CAD, no angina -Coronary calcium score 03/2022: three-vessel coronary artery calcification, score of 680, 91st percentile. - Reviewed testing with patient. -Lipid goal less than 70 -Continue aspirin 81 mg daily -Encouraged healthy diet and exercise. -Without exertional symptoms, will defer on further testing at this time.  Patient would be a good candidate for stress PET if symptoms develop.    Hyperlipidemia with goal LDL less than 70 -has seen lipid pharmD -intolerant to rosuvastatin in the past d/t elevated LFTs 3x nomal -Renew Praluent prescription.  Recheck lipids in 3 months. -If LDL over 70 at that point, could consider low-dose Crestor now that patient's fatty liver has improved with weight loss. -Discussed cholesterol lowering diets - Mediterranean diet, DASH diet, vegetarian diet, low-carbohydrate diet and avoidance of trans fats.  Discussed healthier choice substitutes.  Nuts, high-fiber foods, and fiber supplements may also improve lipids.    Carotid artery disease -Carotid duplex October 2023 with minimal to mild plaque -No carotid bruit heard on exam. -Continue lipid therapy and aspirin.  Hx of tobacco use  -Plan screening AAA ultrasound at age 69.  Disposition - Follow-up in 3 months following lipid recheck.   Medication Adjustments/Labs and Tests Ordered: Current medicines are reviewed at length with the patient today.  Concerns regarding medicines are outlined above.  Orders Placed This Encounter  Procedures   Lipid panel   EKG 12-Lead   Meds ordered this encounter  Medications   Alirocumab (PRALUENT) 150 MG/ML SOAJ    Sig: Inject 150 mg into the skin every 14 (fourteen) days.    Dispense:  2  mL     Refill:  11    Patient Instructions  Medication Instructions:  No Changes *If you need a refill on your cardiac medications before your next appointment, please call your pharmacy*   Lab Work: Lipid Panel 3 months If you have labs (blood work) drawn today and your tests are completely normal, you will receive your results only by: [place] (if you have MyChart) OR A paper copy in the mail If you have any lab test that is abnormal or we need to change your treatment, we will call you to review the results.   Testing/Procedures: No Testing   Follow-Up: At [facility], you and your health needs are our priority.  As part of our continuing mission to provide you with exceptional heart care, we have created designated Provider Care Teams.  These Care Teams include your primary Cardiologist (physician) and Advanced Practice Providers (APPs -  Physician Assistants and Nurse Practitioners) who all work together to provide you with the care you need, when you need it.  We recommend signing up for the patient portal called "MyChart".  Sign up information is provided on this After Visit Summary.  MyChart is used to connect with patients for Virtual Visits (Telemedicine).  Patients are able to view lab/test results, encounter notes, upcoming appointments, etc.  Non-urgent messages can be sent to your provider as well.   To learn more about what you can do with MyChart, go to [email or web address].    Your next appointment:   3 month(s)  The format for your next appointment:   In Person  Provider:   Caron Presume, PA-C    Then, Kirk Ruths, MD will plan to see you again in 1 year(s).      Signed, Warren Lacy, PA-C  04/13/2022 10:35 AM    [place]

## 2022-04-12 NOTE — Telephone Encounter (Signed)
Please advise 

## 2022-04-13 ENCOUNTER — Encounter: Payer: Self-pay | Admitting: Physician Assistant

## 2022-04-13 ENCOUNTER — Ambulatory Visit: Payer: 59 | Attending: Physician Assistant | Admitting: Physician Assistant

## 2022-04-13 VITALS — BP 126/84 | HR 77 | Ht 69.0 in | Wt 236.2 lb

## 2022-04-13 DIAGNOSIS — I6523 Occlusion and stenosis of bilateral carotid arteries: Secondary | ICD-10-CM | POA: Diagnosis not present

## 2022-04-13 DIAGNOSIS — I2510 Atherosclerotic heart disease of native coronary artery without angina pectoris: Secondary | ICD-10-CM | POA: Diagnosis not present

## 2022-04-13 DIAGNOSIS — E785 Hyperlipidemia, unspecified: Secondary | ICD-10-CM | POA: Diagnosis not present

## 2022-04-13 MED ORDER — PRALUENT 150 MG/ML ~~LOC~~ SOAJ: 150.0000 mg | SUBCUTANEOUS | 11 refills | Status: DC

## 2022-04-13 NOTE — Patient Instructions (Signed)
Medication Instructions:  No Changes *If you need a refill on your cardiac medications before your next appointment, please call your pharmacy*   Lab Work: Lipid Panel 3 months If you have labs (blood work) drawn today and your tests are completely normal, you will receive your results only by: [place] (if you have MyChart) OR A paper copy in the mail If you have any lab test that is abnormal or we need to change your treatment, we will call you to review the results.   Testing/Procedures: No Testing   Follow-Up: At [facility], you and your health needs are our priority.  As part of our continuing mission to provide you with exceptional heart care, we have created designated Provider Care Teams.  These Care Teams include your primary Cardiologist (physician) and Advanced Practice Providers (APPs -  Physician Assistants and Nurse Practitioners) who all work together to provide you with the care you need, when you need it.  We recommend signing up for the patient portal called "MyChart".  Sign up information is provided on this After Visit Summary.  MyChart is used to connect with patients for Virtual Visits (Telemedicine).  Patients are able to view lab/test results, encounter notes, upcoming appointments, etc.  Non-urgent messages can be sent to your provider as well.   To learn more about what you can do with MyChart, go to [email or web address].    Your next appointment:   3 month(s)  The format for your next appointment:   In Person  Provider:   Caron Presume, PA-C    Then, Kirk Ruths, MD will plan to see you again in 1 year(s).

## 2022-04-14 MED ORDER — PRALUENT 150 MG/ML ~~LOC~~ SOAJ: 150.0000 mg | SUBCUTANEOUS | 11 refills | Status: DC

## 2022-04-18 ENCOUNTER — Telehealth: Payer: Self-pay | Admitting: Physician Assistant

## 2022-04-18 ENCOUNTER — Encounter: Payer: Self-pay | Admitting: Pharmacist

## 2022-04-18 MED ORDER — REPATHA SURECLICK 140 MG/ML ~~LOC~~ SOAJ: 1.0000 mL | SUBCUTANEOUS | 11 refills | Status: DC

## 2022-04-18 NOTE — Telephone Encounter (Signed)
Spoke with [facility], aware insurance will cover repatha and he will take that instead of praulant

## 2022-04-18 NOTE — Telephone Encounter (Signed)
New Message:     Anderson Malta is calling to see idf Prior Authorization was received that was faxed on 03-15-22?

## 2022-05-02 ENCOUNTER — Other Ambulatory Visit (HOSPITAL_COMMUNITY): Payer: Self-pay | Admitting: Internal Medicine

## 2022-05-02 DIAGNOSIS — I6523 Occlusion and stenosis of bilateral carotid arteries: Secondary | ICD-10-CM

## 2022-06-14 ENCOUNTER — Encounter: Payer: Self-pay | Admitting: Gastroenterology

## 2022-07-20 ENCOUNTER — Telehealth: Payer: Self-pay

## 2022-07-20 ENCOUNTER — Ambulatory Visit: Payer: 59 | Admitting: Physician Assistant

## 2022-07-20 NOTE — Telephone Encounter (Signed)
MyChart message sent to patient to go to the lab this month for Dr. [name] for LFTs

## 2022-07-20 NOTE — Telephone Encounter (Signed)
-----   Message from Roetta Sessions, [place] sent at 12/27/2021 12:26 PM EDT ----- Regarding: due for LFTs Due for LTFs  (6 to 9 months from 7-14)

## 2022-07-25 NOTE — Telephone Encounter (Signed)
Called and left message for patient that he needs to go to the lab for LFTs.

## 2022-07-29 ENCOUNTER — Ambulatory Visit: Payer: 59 | Admitting: Nurse Practitioner

## 2022-08-07 NOTE — Progress Notes (Signed)
Cardiology Clinic Note   Patient Name: Harry Carroll Date of Encounter: 08/10/2022  Primary Care Provider:  Corwin Levins, MD Primary Cardiologist:  Olga Millers, MD  Patient Profile    Harry Carroll 62 year old male presents to the clinic today for follow-up evaluation of his coronary artery disease and hyperlipidemia.  Past Medical History    Past Medical History:  Diagnosis Date   Arthritis    Bipolar 1 disorder (HCC)    Dr Jennelle Human , pt denies   Carotid stenosis    Carpal tunnel syndrome    saw Dr Onnie Graham , pt unaware   Cervical spinal stenosis    Colon polyps    adenomatous   Complication of anesthesia    trouble placing catheter with last surgeery, had to get urologist   Diverticulitis    Diverticulosis    Heart murmur    pt denies   History of hiatal hernia    resolved   Hyperlipemia    Insomnia    related to pain   Normal cardiac stress test    CP 01-2009---Nl ECHO and stress test neg   Numbness of fingers of both hands    when sleeps, right hand worse than left   PVC (premature ventricular contraction)    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   Restless leg    Sleep apnea    does not use cpap due to does not like   Past Surgical History:  Procedure Laterality Date   COLONOSCOPY WITH PROPOFOL N/A 04/02/2014   Procedure: COLONOSCOPY WITH PROPOFOL;  Surgeon: Louis Meckel, MD;  Location: [facility];  Service: Endoscopy;  Laterality: N/A;   CYSTOSCOPY WITH URETHRAL DILATATION  12/20/2011   Procedure: CYSTOSCOPY WITH URETHRAL DILATATION;  Surgeon: Velora Heckler, MD;  Location: [facility];  Service: General;;  with foley insertion   CYSTOSCOPY WITH URETHRAL DILATATION  12/20/2011   Procedure: CYSTOSCOPY WITH URETHRAL DILATATION;  Surgeon: Marcine Matar, MD;  Location: [facility];  Service: Urology;;   CYSTOSCOPY WITH URETHRAL DILATATION N/A 12/15/2017   Procedure: CYSTOSCOPY WITH URETHRAL DILATATION;  Surgeon: Bjorn Pippin, MD;  Location: [facility];  Service:  Urology;  Laterality: N/A;   HOT HEMOSTASIS N/A 04/02/2014   Procedure: HOT HEMOSTASIS (ARGON PLASMA COAGULATION/BICAP);  Surgeon: Louis Meckel, MD;  Location: [facility];  Service: Endoscopy;  Laterality: N/A;   maxillofacial surgery     PARTIAL COLECTOMY  12/20/2011   Procedure: PARTIAL COLECTOMY;  Surgeon: Velora Heckler, MD;  Location: [facility];  Service: General;  Laterality: N/A;  Sigmoid colectomy   right knee arthroscopy     2007 x2   right shoulder surgery     X2   SHOULDER SURGERY     LEFT   TOTAL KNEE ARTHROPLASTY Left 12/15/2017   Procedure: LEFT TOTAL KNEE ARTHROPLASTY;  Surgeon: Eugenia Mcalpine, MD;  Location: [facility];  Service: Orthopedics;  Laterality: Left;  Adductor Block   TOTAL KNEE ARTHROPLASTY Right 05/18/2018   Procedure: TOTAL KNEE ARTHROPLASTY;  Surgeon: Eugenia Mcalpine, MD;  Location: [facility];  Service: Orthopedics;  Laterality: Right;  with block    Allergies  Allergies  Allergen Reactions   Ciprofloxacin Shortness Of Breath, Nausea And Vomiting, Nausea Only, Rash and Other (See Comments)    Marked mucous production   Dilaudid [Hydromorphone Hcl] Shortness Of Breath, Nausea Only and Other (See Comments)    Patient developed rash, excessive mucous production and chest pain - tolerates morphine   Hydrocodone  Itching    History of Present Illness    Harry Carroll has a PMH of hyperlipidemia, carotid artery stenosis, COVID, prior tobacco abuse, fatty liver disease, OSA (not on CPAP) and coronary artery disease.  He underwent coronary calcium scoring 10/23 which showed three-vessel coronary artery calcification.  His coronary calcium score was 680 which placed him in the 91st percentile.  He was seen in follow-up by Juanda Crumble on 04/13/2022.  During that time he continued to do well with weight loss.  He was working with [facility] weight loss center.  He reported that he was no longer taking Wegovy and he had successfully changed his dietary habits.  He had  stopped drinking.  Previously it was felt that he was in the early stages of cirrhosis.  His LFTs normalized.  He denied PND orthopnea, lower extremity swelling, palpitations, syncope.  He was no longer snoring and reported that he felt his sleep apnea had resolved due to his weight loss.  (50-60 pounds).  He has a Kardia device which he has always noted normal sinus rhythm with.  He was previously on Praluent.  He is now taking Repatha.  He presents to the clinic today for follow-up evaluation and states he feels that he continues to do well with his weight loss.  He has lost 60 pounds now.  He no longer requires CPAP and he would like to lose another 20 pounds.  He reports that his dad passed away on 07-01-2024.  He asks about coronary disease in his father and we reviewed carbon monoxide poisoning.  He expressed understanding.  He is on Mounjaro now instead of Wegovy.  We reviewed the importance of continuing to increase physical activity.  He is limited at this time due to grieving.  He does plan to start exercising more as the weather becomes nicer.  I will repeat his fasting lipids and LFTs today and plan follow-up in 6 to 9 months.  Today he denies chest pain, shortness of breath, lower extremity edema, fatigue, palpitations, melena, hematuria, hemoptysis, diaphoresis, weakness, presyncope, syncope, orthopnea, and PND.   Home Medications    Prior to Admission medications   Medication Sig Start Date End Date Taking? Authorizing Provider  albuterol (VENTOLIN HFA) 108 (90 Base) MCG/ACT inhaler Inhale 2 puffs into the lungs every 6 (six) hours as needed for wheezing or shortness of breath. Patient not taking: Reported on 03/24/2022 10/23/19   Nyoka Cowden, MD  aspirin EC 81 MG tablet Take 1 tablet (81 mg total) by mouth daily. Swallow whole. 03/28/22   Corwin Levins, MD  Evolocumab (REPATHA SURECLICK) 140 MG/ML SOAJ Inject 140 mg into the skin every 14 (fourteen) days. 04/18/22   Lewayne Bunting, MD  Semaglutide-[facility]) Inject 1 application. into the skin once a week. Patient not taking: Reported on 04/13/2022    [provider]    Family History    Family History  Problem Relation Age of Onset   Prostate cancer Paternal Grandfather    Cancer Paternal Aunt        lymph cancer   Liver disease Mother    Alcohol abuse Father    Alcohol abuse Sister    Colon cancer Neg Hx    CAD Neg Hx    Pancreatic cancer Neg Hx    Rectal cancer Neg Hx    Stomach cancer Neg Hx    Drug abuse Neg Hx    Anxiety disorder Neg Hx  Bipolar disorder Neg Hx    Depression Neg Hx    He indicated that his mother is deceased. He indicated that his father is alive. He indicated that the status of his sister is unknown. He indicated that his paternal grandfather is deceased. He indicated that his paternal aunt is alive. He indicated that the status of his neg hx is unknown.  Social History    Social History   Socioeconomic History   Marital status: Married    Spouse name: Not on file   Number of children: 4   Years of education: 14   Highest education level: Not on file  Occupational History   Occupation: Software    Employer: [employer]  Tobacco Use   Smoking status: Former    Types: Cigars    Quit date: 06/13/2018    Years since quitting: 4.1   Smokeless tobacco: Former    Types: Snuff    Quit date: 12/10/2017   Tobacco comments:    1 per week  Vaping Use   Vaping Use: Never used  Substance and Sexual Activity   Alcohol use: Yes    Comment:  a couple beers a few times a week    Drug use: No    Comment: denies THC, cocainse, stimulants, pain meds, benzos, synthetic drugs   Sexual activity: Not Currently  Other Topics Concern   Not on file  Social History Narrative   Born in [place] and grew up in [place]. Currently resides in a house with his wife. 1 dog, 1 snake, 1 cat. 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    Denies religious beliefs that  would effect health care.    Social Determinants of Health   Financial Resource Strain: Not on file  Food Insecurity: Not on file  Transportation Needs: Not on file  Physical Activity: Not on file  Stress: Not on file  Social Connections: Not on file  Intimate Partner Violence: Not on file     Review of Systems    General:  No chills, fever, night sweats or weight changes.  Cardiovascular:  No chest pain, dyspnea on exertion, edema, orthopnea, palpitations, paroxysmal nocturnal dyspnea. Dermatological: No rash, lesions/masses Respiratory: No cough, dyspnea Urologic: No hematuria, dysuria Abdominal:   No nausea, vomiting, diarrhea, bright red blood per rectum, melena, or hematemesis Neurologic:  No visual changes, wkns, changes in mental status. All other systems reviewed and are otherwise negative except as noted above.  Physical Exam    VS:  BP 116/74 (BP Location: Left Arm, Patient Position: Sitting, Cuff Size: Large)   Pulse 80   Ht 5\' 9"  (1.753 m)   Wt 251 lb 12.8 oz (114.2 kg)   SpO2 93%   BMI 37.18 kg/m  , BMI Body mass index is 37.18 kg/m. GEN: Well nourished, well developed, in no acute distress. HEENT: normal. Neck: Supple, no JVD, carotid bruits, or masses. Cardiac: RRR, no murmurs, rubs, or gallops. No clubbing, cyanosis, edema.  Radials/DP/PT 2+ and equal bilaterally.  Respiratory:  Respirations regular and unlabored, clear to auscultation bilaterally. GI: Soft, nontender, nondistended, BS + x 4. MS: no deformity or atrophy. Skin: warm and dry, no rash. Neuro:  Strength and sensation are intact. Psych: Normal affect.  Accessory Clinical Findings    Recent Labs: 03/24/2022: ALT 33; BUN 20; Creatinine, Ser 1.00; Hemoglobin 16.0; Platelets 224.0; Potassium 4.3; Sodium 136; TSH 1.62   Recent Lipid Panel    Component Value Date/Time   CHOL 228 (H) 03/24/2022  1044   CHOL 190 12/04/2020 1132   TRIG 79.0 03/24/2022 1044   TRIG 118 03/30/2006 0906   HDL  69.40 03/24/2022 1044   HDL 39 (L) 12/04/2020 1132   CHOLHDL 3 03/24/2022 1044   VLDL 15.8 03/24/2022 1044   LDLCALC 143 (H) 03/24/2022 1044   LDLCALC 116 (H) 12/04/2020 1132   LDLDIRECT 104.0 08/22/2019 1356         ECG personally reviewed by me today-none today.  Echocardiogram 02/06/2019  IMPRESSIONS     1. The left ventricle has normal systolic function with an ejection  fraction of 60-65%. The cavity size was normal. Left ventricular diastolic  parameters were normal.   2. The right ventricle has normal systolic function. The cavity was  normal. There is no increase in right ventricular wall thickness.   3. No evidence of mitral valve stenosis.   4. The aorta is normal unless otherwise noted.   5. The aortic root and ascending aorta are normal in size and structure.   6. The atrial septum is grossly normal.   FINDINGS   Left Ventricle: The left ventricle has normal systolic function, with an  ejection fraction of 60-65%. The cavity size was normal. There is no  increase in left ventricular wall thickness. Left ventricular diastolic  parameters were normal.   Right Ventricle: The right ventricle has normal systolic function. The  cavity was normal. There is no increase in right ventricular wall  thickness.   Left Atrium: Left atrial size was normal in size.   Right Atrium: Right atrial size was normal in size. Right atrial pressure  is estimated at 3 mmHg.   Interatrial Septum: The atrial septum is grossly normal.   Pericardium: There is no evidence of pericardial effusion.   Mitral Valve: The mitral valve is normal in structure. Mitral valve  regurgitation is not visualized by color flow Doppler. No evidence of  mitral valve stenosis.   Tricuspid Valve: The tricuspid valve is normal in structure. Tricuspid  valve regurgitation is trivial by color flow Doppler.   Aortic Valve: The aortic valve is normal in structure. Aortic valve  regurgitation was not  visualized by color flow Doppler.   Pulmonic Valve: The pulmonic valve was grossly normal. Pulmonic valve  regurgitation is not visualized by color flow Doppler.   Aorta: The aortic root and ascending aorta are normal in size and  structure. The aorta is normal unless otherwise noted.    Assessment & Plan   1.  Hyperlipidemia-03/24/2022: Cholesterol 228; HDL 69.40; LDL Cholesterol 143; Triglycerides 79.0; VLDL 15.8.  LDL goal less than 70. He is intolerant of statin therapy due to elevated LFTs.  He was previously on Praluent. Continue cholesterol-lowering diet Continue Repatha  Coronary artery disease-no chest pain today.  Underwent calcium scoring 10/23 which showed three-vessel coronary calcification with a score of 688 placing him in the 91st percentile Continue aspirin, Repatha Heart healthy low-sodium diet-salty 6 given Increase physical activity as tolerated  Carotid artery disease-denies recent episodes of lightheadedness presyncope or syncope.  Carotid duplex 10/23 showed 1-39% right ICA and minimal wall thickening left ICA Continue aspirin, Repatha Plan for repeat carotid duplex 10/24  Disposition: Follow-up with Dr. Jens Som, Juanda Crumble, or me in 6-9.    Thomasene Ripple. Dayla Gasca NP-C     08/10/2022, 8:20 AM [facility] [address] Office [phone] Fax [phone]    I spent 14 minutes examining this patient, reviewing medications, and using patient  centered shared decision making involving her cardiac care.  Prior to her visit I spent greater than 20 minutes reviewing her past medical history,  medications, and prior cardiac tests.

## 2022-08-10 ENCOUNTER — Ambulatory Visit: Payer: 59 | Attending: Physician Assistant | Admitting: General Practice

## 2022-08-10 ENCOUNTER — Encounter: Payer: Self-pay | Admitting: General Practice

## 2022-08-10 VITALS — BP 116/74 | HR 80 | Ht 69.0 in | Wt 251.8 lb

## 2022-08-10 DIAGNOSIS — E785 Hyperlipidemia, unspecified: Secondary | ICD-10-CM | POA: Diagnosis not present

## 2022-08-10 DIAGNOSIS — I6523 Occlusion and stenosis of bilateral carotid arteries: Secondary | ICD-10-CM

## 2022-08-10 DIAGNOSIS — I2510 Atherosclerotic heart disease of native coronary artery without angina pectoris: Secondary | ICD-10-CM | POA: Diagnosis not present

## 2022-08-10 NOTE — Patient Instructions (Signed)
Medication Instructions:  The current medical regimen is effective;  continue present plan and medications as directed. Please refer to the Current Medication list given to you today.  *If you need a refill on your cardiac medications before your next appointment, please call your pharmacy*  Lab Work: LIPID AND LFT TODAY If you have labs (blood work) drawn today and your tests are completely normal, you will receive your results only by: [place] (if you have MyChart) OR  A paper copy in the mail If you have any lab test that is abnormal or we need to change your treatment, we will call you to review the results.  Testing/Procedures: NONE  Follow-Up: At [facility], you and your health needs are our priority.  As part of our continuing mission to provide you with exceptional heart care, we have created designated Provider Care Teams.  These Care Teams include your primary Cardiologist (physician) and Advanced Practice Providers (APPs -  Physician Assistants and Nurse Practitioners) who all work together to provide you with the care you need, when you need it.  Your next appointment:   6-9 month(s)  Provider:   Kirk Ruths, MD  or Coletta Memos, FNP or Caron Presume, PA-C       Other Instructions INCREASE PHYSICAL ACTIVITY-AS TOLERATED 150 MIN OF MODERATE PHYSICAL ACTIVITY/WEEKLY  PLEASE HAVE PRIMARY MD [place] YOUR [place] TO [phone]

## 2022-08-11 LAB — LIPID PANEL
Chol/HDL Ratio: 2.7 ratio (ref 0.0–5.0)
Cholesterol, Total: 144 mg/dL (ref 100–199)
HDL: 53 mg/dL (ref >39)
LDL Chol Calc (NIH): 70 mg/dL (ref 0–99)
Triglycerides: 115 mg/dL (ref 0–149)
VLDL Cholesterol Cal: 21 mg/dL (ref 5–40)

## 2022-08-11 LAB — HEPATIC FUNCTION PANEL
ALT: 34 IU/L (ref 0–44)
AST: 24 IU/L (ref 0–40)
Albumin: 4.5 g/dL (ref 3.9–4.9)
Alkaline Phosphatase: 44 IU/L (ref 44–121)
Bilirubin Total: 0.9 mg/dL (ref 0.0–1.2)
Bilirubin, Direct: 0.29 mg/dL (ref 0.00–0.40)
Total Protein: 6.7 g/dL (ref 6.0–8.5)

## 2022-10-13 ENCOUNTER — Encounter: Payer: Self-pay | Admitting: Internal Medicine

## 2022-10-13 ENCOUNTER — Ambulatory Visit (INDEPENDENT_AMBULATORY_CARE_PROVIDER_SITE_OTHER): Payer: 59

## 2022-10-13 ENCOUNTER — Ambulatory Visit
Admission: RE | Admit: 2022-10-13 | Discharge: 2022-10-13 | Disposition: A | Discharge status: Home / Self Care | Payer: 59 | Source: Ambulatory Visit | Attending: Family Medicine | Admitting: Family Medicine

## 2022-10-13 VITALS — BP 137/89 | HR 102 | Temp 99.3°F | Resp 18

## 2022-10-13 DIAGNOSIS — J441 Chronic obstructive pulmonary disease with (acute) exacerbation: Secondary | ICD-10-CM

## 2022-10-13 DIAGNOSIS — J209 Acute bronchitis, unspecified: Secondary | ICD-10-CM

## 2022-10-13 MED ORDER — DOXYCYCLINE HYCLATE 100 MG PO CAPS: 100.0000 mg | ORAL_CAPSULE | Freq: Two times a day (BID) | ORAL | 0 refills | Status: AC

## 2022-10-13 MED ORDER — PREDNISONE 20 MG PO TABS: 40.0000 mg | ORAL_TABLET | Freq: Every day | ORAL | 0 refills | Status: AC

## 2022-10-13 MED ORDER — ALBUTEROL SULFATE HFA 108 (90 BASE) MCG/ACT IN AERS: 2.0000 | INHALATION_SPRAY | RESPIRATORY_TRACT | 0 refills | Status: AC | PRN

## 2022-10-13 MED ORDER — BENZONATATE 100 MG PO CAPS: 100.0000 mg | ORAL_CAPSULE | Freq: Three times a day (TID) | ORAL | 0 refills | Status: DC | PRN

## 2022-10-13 NOTE — Discharge Instructions (Signed)
There is no pneumonia or fluid on your x-ray.  There was evidence of bronchitis  Take prednisone 20 mg--2 daily for 5 days  Take doxycycline 100 mg --1 capsule 2 times daily for 7 days  Albuterol inhaler--do 2 puffs every 4 hours as needed for shortness of breath or wheezing

## 2022-10-13 NOTE — ED Provider Notes (Signed)
[facility]    CSN: [id number] Arrival date & time: 10/13/22  1542      History   Chief Complaint Chief Complaint  Patient presents with   Cough    Entered by patient    HPI Harry Carroll is a 62 y.o. male.    Cough  Here for congestion and cough.  About 7 to 10 days ago he began having nasal drainage and head cold symptoms.  That has improved and he is not really having much sinus pressure now. He feels he has more congestion in his chest and has felt short of breath some.  He has used an inhaler in the past and has albuterol at home  Reports a history of severe COVID-pneumonia.  His wife has had similar symptoms and she developed chest congestion and fever.  This patient has not had any fever      Past Medical History:  Diagnosis Date   Arthritis    Bipolar 1 disorder (HCC)    Dr Jennelle Human , pt denies   Carotid stenosis    Carpal tunnel syndrome    saw Dr Onnie Graham , pt unaware   Cervical spinal stenosis    Colon polyps    adenomatous   Complication of anesthesia    trouble placing catheter with last surgeery, had to get urologist   Diverticulitis    Diverticulosis    Heart murmur    pt denies   History of hiatal hernia    resolved   Hyperlipemia    Insomnia    related to pain   Normal cardiac stress test    CP 01-2009---Nl ECHO and stress test neg   Numbness of fingers of both hands    when sleeps, right hand worse than left   PVC (premature ventricular contraction)    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   Restless leg    Sleep apnea    does not use cpap due to does not like    Patient Active Problem List   Diagnosis Date Noted   Bilateral carotid artery stenosis 03/24/2022   Pain in finger of right hand 05/21/2021   History of COVID-19 03/04/2020   Physical deconditioning 03/04/2020   Posterior rhinorrhea 09/29/2019   DOE (dyspnea on exertion) 09/26/2019   Abnormal liver function tests 06/12/2019   Alcohol use, unspecified  with unspecified alcohol-induced disorder (HCC) 03/19/2019   Acute respiratory failure with hypoxia (HCC) 12/21/2018   Depression 12/21/2018   COVID-19 virus infection 12/20/2018   De Quervain's tenosynovitis, right 07/31/2018   S/P knee replacement 05/18/2018   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/15/2017   Hyperglycemia 06/22/2017   Low testosterone 10/03/2016   Morbid obesity due to excess calories (HCC) 03/28/2016   Acute diverticulitis 10/21/2015   Encounter for well adult exam with abnormal findings 03/24/2015   Contusion of rib 11/13/2014   History of colonic polyps 04/02/2014   Diverticulitis of colon (without mention of hemorrhage)(562.11) 01/17/2014   Diverticulitis-post resection July 2013 11/27/2011   Obstructive sleep apnea syndrome 11/17/2011   Personal history of colonic polyps 02/18/2011   HYPERTRIGLYCERIDEMIA 02/20/2009   Bipolar I disorder (HCC) 11/29/2007   Hyperlipidemia 04/16/2007    Past Surgical History:  Procedure Laterality Date   COLONOSCOPY WITH PROPOFOL N/A 04/02/2014   Procedure: COLONOSCOPY WITH PROPOFOL;  Surgeon: Louis Meckel, MD;  Location: [facility];  Service: Endoscopy;  Laterality: N/A;   CYSTOSCOPY WITH URETHRAL DILATATION  12/20/2011   Procedure: CYSTOSCOPY WITH URETHRAL  DILATATION;  Surgeon: Velora Heckler, MD;  Location: [facility];  Service: General;;  with foley insertion   CYSTOSCOPY WITH URETHRAL DILATATION  12/20/2011   Procedure: CYSTOSCOPY WITH URETHRAL DILATATION;  Surgeon: Marcine Matar, MD;  Location: [facility];  Service: Urology;;   CYSTOSCOPY WITH URETHRAL DILATATION N/A 12/15/2017   Procedure: CYSTOSCOPY WITH URETHRAL DILATATION;  Surgeon: Bjorn Pippin, MD;  Location: [facility];  Service: Urology;  Laterality: N/A;   HOT HEMOSTASIS N/A 04/02/2014   Procedure: HOT HEMOSTASIS (ARGON PLASMA COAGULATION/BICAP);  Surgeon: Louis Meckel, MD;  Location: [facility];  Service: Endoscopy;  Laterality: N/A;   maxillofacial surgery     PARTIAL  COLECTOMY  12/20/2011   Procedure: PARTIAL COLECTOMY;  Surgeon: Velora Heckler, MD;  Location: [facility];  Service: General;  Laterality: N/A;  Sigmoid colectomy   right knee arthroscopy     2007 x2   right shoulder surgery     X2   SHOULDER SURGERY     LEFT   TOTAL KNEE ARTHROPLASTY Left 12/15/2017   Procedure: LEFT TOTAL KNEE ARTHROPLASTY;  Surgeon: Eugenia Mcalpine, MD;  Location: [facility];  Service: Orthopedics;  Laterality: Left;  Adductor Block   TOTAL KNEE ARTHROPLASTY Right 05/18/2018   Procedure: TOTAL KNEE ARTHROPLASTY;  Surgeon: Eugenia Mcalpine, MD;  Location: [facility];  Service: Orthopedics;  Laterality: Right;  with block       Home Medications    Prior to Admission medications   Medication Sig Start Date End Date Taking? Authorizing Provider  albuterol (VENTOLIN HFA) 108 (90 Base) MCG/ACT inhaler Inhale 2 puffs into the lungs every 4 (four) hours as needed for wheezing or shortness of breath. 10/13/22  Yes Zenia Resides, MD  benzonatate (TESSALON) 100 MG capsule Take 1 capsule (100 mg total) by mouth 3 (three) times daily as needed for cough. 10/13/22  Yes Zenia Resides, MD  doxycycline (VIBRAMYCIN) 100 MG capsule Take 1 capsule (100 mg total) by mouth 2 (two) times daily for 7 days. 10/13/22 10/20/22 Yes Dai Apel, Janace Aris, MD  predniSONE (DELTASONE) 20 MG tablet Take 2 tablets (40 mg total) by mouth daily with breakfast for 5 days. 10/13/22 10/18/22 Yes Zenia Resides, MD  aspirin EC 81 MG tablet Take 1 tablet (81 mg total) by mouth daily. Swallow whole. 03/28/22   Corwin Levins, MD  Evolocumab (REPATHA SURECLICK) 140 MG/ML SOAJ Inject 140 mg into the skin every 14 (fourteen) days. 04/18/22   Lewayne Bunting, MD  Semaglutide-Weight Management [facility]) Inject 1 application  into the skin once a week.    [provider]  tirzepatide Greggory Keen) 5 MG/0.5ML Pen Inject 5 mg into the skin once a week.    [provider]    Family History Family History  Problem  Relation Age of Onset   Prostate cancer Paternal Grandfather    Cancer Paternal Aunt        lymph cancer   Liver disease Mother    Alcohol abuse Father    Alcohol abuse Sister    Colon cancer Neg Hx    CAD Neg Hx    Pancreatic cancer Neg Hx    Rectal cancer Neg Hx    Stomach cancer Neg Hx    Drug abuse Neg Hx    Anxiety disorder Neg Hx    Bipolar disorder Neg Hx    Depression Neg Hx     Social History Social History   Tobacco Use   Smoking status: Former  Types: Cigars    Quit date: 06/13/2018    Years since quitting: 4.3   Smokeless tobacco: Former    Types: Snuff    Quit date: 12/10/2017   Tobacco comments:    1 per week  Vaping Use   Vaping Use: Never used  Substance Use Topics   Alcohol use: Yes    Comment:  a couple beers a few times a week    Drug use: No    Comment: denies THC, cocainse, stimulants, pain meds, benzos, synthetic drugs     Allergies   Ciprofloxacin, Dilaudid [hydromorphone hcl], and Hydrocodone   Review of Systems Review of Systems  Respiratory:  Positive for cough.      Physical Exam Triage Vital Signs ED Triage Vitals  Enc Vitals Group     BP 10/13/22 1614 137/89     Pulse Rate 10/13/22 1614 (!) 102     Resp 10/13/22 1614 18     Temp 10/13/22 1614 99.3 F (37.4 C)     Temp Source 10/13/22 1614 Oral     SpO2 10/13/22 1614 96 %     Weight --      Height --      Head Circumference --      Peak Flow --      Pain Score 10/13/22 1615 0     Pain Loc --      Pain Edu? --      Excl. in GC? --    No data found.  Updated Vital Signs BP 137/89 (BP Location: Right Arm)   Pulse (!) 102   Temp 99.3 F (37.4 C) (Oral)   Resp 18   SpO2 96%   Visual Acuity Right Eye Distance:   Left Eye Distance:   Bilateral Distance:    Right Eye Near:   Left Eye Near:    Bilateral Near:     Physical Exam Vitals reviewed.  Constitutional:      General: He is not in acute distress.    Appearance: He is not ill-appearing,  toxic-appearing or diaphoretic.  HENT:     Nose: Nose normal.     Mouth/Throat:     Mouth: Mucous membranes are moist.     Pharynx: No oropharyngeal exudate or posterior oropharyngeal erythema.  Eyes:     Extraocular Movements: Extraocular movements intact.     Conjunctiva/sclera: Conjunctivae normal.     Pupils: Pupils are equal, round, and reactive to light.  Cardiovascular:     Rate and Rhythm: Normal rate and regular rhythm.     Heart sounds: No murmur heard. Pulmonary:     Effort: Pulmonary effort is normal. No respiratory distress.     Breath sounds: No stridor. No wheezing, rhonchi or rales.     Comments: No wheezing at the time of exam and there is some good air movement Musculoskeletal:     Cervical back: Neck supple.  Lymphadenopathy:     Cervical: No cervical adenopathy.  Skin:    Coloration: Skin is not jaundiced or pale.  Neurological:     General: No focal deficit present.     Mental Status: He is alert and oriented to person, place, and time.  Psychiatric:        Behavior: Behavior normal.      UC Treatments / Results  Labs (all labs ordered are listed, but only abnormal results are displayed) Labs Reviewed - No data to display  EKG   Radiology DG Chest 2 View  Result  Date: 10/13/2022 CLINICAL DATA:  Chest congestion, wheezing, cough EXAM: CHEST - 2 VIEW COMPARISON:  12/09/2019 FINDINGS: Cardiac size is within normal limits. There is peribronchial thickening. There is no focal consolidation. There is no pleural effusion or pneumothorax. IMPRESSION: Peribronchial thickening suggest bronchitis. There is no focal pulmonary consolidation. There is no pleural effusion or pneumothorax. Electronically Signed   By: Ernie Avena M.D.   On: 10/13/2022 16:51    Procedures Procedures (including critical care time)  Medications Ordered in UC Medications - No data to display  Initial Impression / Assessment and Plan / UC Course  I have reviewed the triage  vital signs and the nursing notes.  Pertinent labs & imaging results that were available during my care of the patient were reviewed by me and considered in my medical decision making (see chart for details).        No evidence of pneumonia but there is some suggestion of bronchitis on x-ray. Final Clinical Impressions(s) / UC Diagnoses   Final diagnoses:  Acute bronchitis, unspecified organism  COPD exacerbation (HCC)     Discharge Instructions      There is no pneumonia or fluid on your x-ray.  There was evidence of bronchitis  Take prednisone 20 mg--2 daily for 5 days  Take doxycycline 100 mg --1 capsule 2 times daily for 7 days  Albuterol inhaler--do 2 puffs every 4 hours as needed for shortness of breath or wheezing      ED Prescriptions     Medication Sig Dispense Auth. Provider   albuterol (VENTOLIN HFA) 108 (90 Base) MCG/ACT inhaler Inhale 2 puffs into the lungs every 4 (four) hours as needed for wheezing or shortness of breath. 1 each Zenia Resides, MD   predniSONE (DELTASONE) 20 MG tablet Take 2 tablets (40 mg total) by mouth daily with breakfast for 5 days. 10 tablet Zenia Resides, MD   doxycycline (VIBRAMYCIN) 100 MG capsule Take 1 capsule (100 mg total) by mouth 2 (two) times daily for 7 days. 14 capsule Lamyah Creed, Janace Aris, MD   benzonatate (TESSALON) 100 MG capsule Take 1 capsule (100 mg total) by mouth 3 (three) times daily as needed for cough. 21 capsule Zenia Resides, MD      PDMP not reviewed this encounter.   Zenia Resides, MD 10/13/22 [phone]

## 2022-10-13 NOTE — ED Triage Notes (Signed)
Patient with c/o cough and congestion x1 week. Patient tried OTC robitussin with not much relief.

## 2022-11-09 ENCOUNTER — Ambulatory Visit
Admission: RE | Admit: 2022-11-09 | Discharge: 2022-11-09 | Disposition: A | Discharge status: Home / Self Care | Payer: 59 | Source: Ambulatory Visit | Attending: Emergency Medicine | Admitting: Emergency Medicine

## 2022-11-09 VITALS — BP 154/82 | HR 74 | Temp 98.4°F | Resp 20

## 2022-11-09 DIAGNOSIS — S46812A Strain of other muscles, fascia and tendons at shoulder and upper arm level, left arm, initial encounter: Secondary | ICD-10-CM

## 2022-11-09 MED ORDER — PREDNISONE 10 MG (21) PO TBPK: ORAL_TABLET | Freq: Every day | ORAL | 0 refills | Status: DC

## 2022-11-09 MED ORDER — KETOROLAC TROMETHAMINE 30 MG/ML IJ SOLN
30.0000 mg | Freq: Once | INTRAMUSCULAR | Status: AC
  Administered 2022-11-09: 30 mg via INTRAMUSCULAR

## 2022-11-09 MED ORDER — CYCLOBENZAPRINE HCL 10 MG PO TABS: 10.0000 mg | ORAL_TABLET | Freq: Every day | ORAL | 0 refills | Status: DC

## 2022-11-09 NOTE — ED Provider Notes (Signed)
[facility]    CSN: [id number] Arrival date & time: 11/09/22  1342      History   Chief Complaint Chief Complaint  Patient presents with   Back Pain    Upper back under shoulder blade - Entered by patient   Chest Pain    HPI Harry Carroll is a 62 y.o. male.   Patient presents for evaluation of left back pain radiating to the left chest beginning 2 weeks ago.  Pain has been constant and fluctuating in severity described as sharp .  Symptoms are worse when lying on back and when from turning from side-to-side causing him to almost lose his breath due to the severity.  Intermittently has had tingling radiate down the left arm into the pinky.  Denies injury or trauma change in activity or fall.  Has attempted use of Butrans which will be effective.  Dors is he did a 6-lead EKG on his cell phone which showed no abnormality.  Past Medical History:  Diagnosis Date   Arthritis    Bipolar 1 disorder (HCC)    Dr Jennelle Human , pt denies   Carotid stenosis    Carpal tunnel syndrome    saw Dr Onnie Graham , pt unaware   Cervical spinal stenosis    Colon polyps    adenomatous   Complication of anesthesia    trouble placing catheter with last surgeery, had to get urologist   Diverticulitis    Diverticulosis    Heart murmur    pt denies   History of hiatal hernia    resolved   Hyperlipemia    Insomnia    related to pain   Normal cardiac stress test    CP 01-2009---Nl ECHO and stress test neg   Numbness of fingers of both hands    when sleeps, right hand worse than left   PVC (premature ventricular contraction)    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   Restless leg    Sleep apnea    does not use cpap due to does not like    Patient Active Problem List   Diagnosis Date Noted   Bilateral carotid artery stenosis 03/24/2022   Pain in finger of right hand 05/21/2021   History of COVID-19 03/04/2020   Physical deconditioning 03/04/2020   Posterior rhinorrhea 09/29/2019    DOE (dyspnea on exertion) 09/26/2019   Abnormal liver function tests 06/12/2019   Alcohol use, 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   Acute respiratory failure with hypoxia (HCC) 12/21/2018   Depression 12/21/2018   COVID-19 virus infection 12/20/2018   De Quervain's tenosynovitis, right 07/31/2018   S/P knee replacement 05/18/2018   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/15/2017   Hyperglycemia 06/22/2017   Low testosterone 10/03/2016   Morbid obesity due to excess calories (HCC) 03/28/2016   Acute diverticulitis 10/21/2015   Encounter for well adult exam with abnormal findings 03/24/2015   Contusion of rib 11/13/2014   History of colonic polyps 04/02/2014   Diverticulitis of colon (without mention of hemorrhage)(562.11) 01/17/2014   Diverticulitis-post resection July 2013 11/27/2011   Obstructive sleep apnea syndrome 11/17/2011   Personal history of colonic polyps 02/18/2011   HYPERTRIGLYCERIDEMIA 02/20/2009   Bipolar I disorder (HCC) 11/29/2007   Hyperlipidemia 04/16/2007    Past Surgical History:  Procedure Laterality Date   COLONOSCOPY WITH PROPOFOL N/A 04/02/2014   Procedure: COLONOSCOPY WITH PROPOFOL;  Surgeon: Louis Meckel, MD;  Location: [facility];  Service: Endoscopy;  Laterality: N/A;  CYSTOSCOPY WITH URETHRAL DILATATION  12/20/2011   Procedure: CYSTOSCOPY WITH URETHRAL DILATATION;  Surgeon: Velora Heckler, MD;  Location: [facility];  Service: General;;  with foley insertion   CYSTOSCOPY WITH URETHRAL DILATATION  12/20/2011   Procedure: CYSTOSCOPY WITH URETHRAL DILATATION;  Surgeon: Marcine Matar, MD;  Location: [facility];  Service: Urology;;   CYSTOSCOPY WITH URETHRAL DILATATION N/A 12/15/2017   Procedure: CYSTOSCOPY WITH URETHRAL DILATATION;  Surgeon: Bjorn Pippin, MD;  Location: [facility];  Service: Urology;  Laterality: N/A;   HOT HEMOSTASIS N/A 04/02/2014   Procedure: HOT HEMOSTASIS (ARGON PLASMA COAGULATION/BICAP);  Surgeon: Louis Meckel, MD;  Location: [facility];  Service: Endoscopy;  Laterality: N/A;   maxillofacial surgery     PARTIAL COLECTOMY  12/20/2011   Procedure: PARTIAL COLECTOMY;  Surgeon: Velora Heckler, MD;  Location: [facility];  Service: General;  Laterality: N/A;  Sigmoid colectomy   right knee arthroscopy     2007 x2   right shoulder surgery     X2   SHOULDER SURGERY     LEFT   TOTAL KNEE ARTHROPLASTY Left 12/15/2017   Procedure: LEFT TOTAL KNEE ARTHROPLASTY;  Surgeon: Eugenia Mcalpine, MD;  Location: [facility];  Service: Orthopedics;  Laterality: Left;  Adductor Block   TOTAL KNEE ARTHROPLASTY Right 05/18/2018   Procedure: TOTAL KNEE ARTHROPLASTY;  Surgeon: Eugenia Mcalpine, MD;  Location: [facility];  Service: Orthopedics;  Laterality: Right;  with block       Home Medications    Prior to Admission medications   Medication Sig Start Date End Date Taking? Authorizing Provider  albuterol (VENTOLIN HFA) 108 (90 Base) MCG/ACT inhaler Inhale 2 puffs into the lungs every 4 (four) hours as needed for wheezing or shortness of breath. 10/13/22   Zenia Resides, MD  aspirin EC 81 MG tablet Take 1 tablet (81 mg total) by mouth daily. Swallow whole. 03/28/22   Corwin Levins, MD  Evolocumab (REPATHA SURECLICK) 140 MG/ML SOAJ Inject 140 mg into the skin every 14 (fourteen) days. 04/18/22   Lewayne Bunting, MD  Semaglutide-Weight Management [facility]) Inject 1 application  into the skin once a week.    [provider]  tirzepatide Greggory Keen) 5 MG/0.5ML Pen Inject 5 mg into the skin once a week.    [provider]    Family History Family History  Problem Relation Age of Onset   Prostate cancer Paternal Grandfather    Cancer Paternal Aunt        lymph cancer   Liver disease Mother    Alcohol abuse Father    Alcohol abuse Sister    Colon cancer Neg Hx    CAD Neg Hx    Pancreatic cancer Neg Hx    Rectal cancer Neg Hx    Stomach cancer Neg Hx    Drug abuse Neg Hx    Anxiety disorder Neg Hx     Bipolar disorder Neg Hx    Depression Neg Hx     Social History Social History   Tobacco Use   Smoking status: Former    Types: Cigars    Quit date: 06/13/2018    Years since quitting: 4.4   Smokeless tobacco: Former    Types: Snuff    Quit date: 12/10/2017   Tobacco comments:    1 per week  Vaping Use   Vaping Use: Never used  Substance Use Topics   Alcohol use: Yes    Comment:  a couple beers a few times  a week    Drug use: No    Comment: denies THC, cocainse, stimulants, pain meds, benzos, synthetic drugs     Allergies   Ciprofloxacin, Dilaudid [hydromorphone hcl], and Hydrocodone   Review of Systems Review of Systems  Cardiovascular:  Positive for chest pain.  Musculoskeletal:  Positive for back pain.     Physical Exam Triage Vital Signs ED Triage Vitals  Enc Vitals Group     BP 11/09/22 1358 (!) 154/82     Pulse Rate 11/09/22 1358 74     Resp 11/09/22 1358 20     Temp 11/09/22 1358 98.4 F (36.9 C)     Temp Source 11/09/22 1358 Oral     SpO2 11/09/22 1358 97 %     Weight --      Height --      Head Circumference --      Peak Flow --      Pain Score 11/09/22 1400 2     Pain Loc --      Pain Edu? --      Excl. in GC? --    No data found.  Updated Vital Signs BP (!) 154/82 (BP Location: Right Arm)   Pulse 74   Temp 98.4 F (36.9 C) (Oral)   Resp 20   SpO2 97%   Visual Acuity Right Eye Distance:   Left Eye Distance:   Bilateral Distance:    Right Eye Near:   Left Eye Near:    Bilateral Near:     Physical Exam Constitutional:      Appearance: Normal appearance.  Eyes:     Extraocular Movements: Extraocular movements intact.  Cardiovascular:     Rate and Rhythm: Normal rate and regular rhythm.     Pulses: Normal pulses.     Heart sounds: Normal heart sounds.     Comments: Unable to reproduce tenderness along the chest wall, chest wall is symmetrical Pulmonary:     Effort: Pulmonary effort is normal.     Breath sounds: Normal  breath sounds.  Musculoskeletal:       Back:     Comments: Point tenderness present along the lower aspect of the left shoulder blade  Neurological:     Mental Status: He is alert and oriented to person, place, and time. Mental status is at baseline.      UC Treatments / Results  Labs (all labs ordered are listed, but only abnormal results are displayed) Labs Reviewed - No data to display  EKG   Radiology No results found.  Procedures Procedures (including critical care time)  Medications Ordered in UC Medications - No data to display  Initial Impression / Assessment and Plan / UC Course  I have reviewed the triage vital signs and the nursing notes.  Pertinent labs & imaging results that were available during my care of the patient were reviewed by me and considered in my medical decision making (see chart for details).  Left trapezius strain, initial encounter  Vital signs are stable, patient is in no signs of distress nontoxic-appearing, EKG shows normal sinus rhythm, able to recreate tenderness along the trapezius, believes symptoms today to be muscular, low suspicion for cardiac involvement, Toradol injection given in office and prescribed prednisone taper and Flexeril for home use, recommended mended supportive care through, heat massage stretching and activity as tolerated, given strict precautions to monitor and that if any symptoms worsen in severity he is to go to the nearest emergency department for immediate  evaluation, verbalized understanding Final Clinical Impressions(s) / UC Diagnoses   Final diagnoses:  None   Discharge Instructions   None    ED Prescriptions   None    PDMP not reviewed this encounter.   Valinda Hoar, NP 11/09/22 [phone]

## 2022-11-09 NOTE — Discharge Instructions (Signed)
I believe symptoms today are related to muscular injury, low suspicion for involvement of your heart  Vital signs all are within normal limits and EKG shows heart is beating in a normal pace and rhythm  You have been given an injection of Toradol today here in office which helps to reduce inflammation which in turn will help with pain, daily you will start to see relief in about 30 minutes  Starting tomorrow take prednisone as directed to continue the above process  You may use muscle relaxant at bedtime as needed for additional comfort  You may use heating pad in 15 minute intervals as needed for additional comfort, you may find comfort in using ice in 10-15 minutes over affected area  Begin stretching affected area daily for 10 minutes as tolerated to further loosen muscles   When lying down place pillow underneath and between knees for support  Can try sleeping without pillow on firm mattress   Practice good posture: head back, shoulders back, chest forward, pelvis back and weight distributed evenly on both legs  If pain persist after recommended treatment or reoccurs if may be beneficial to follow up with orthopedic specialist for evaluation, this doctor specializes in the bones and can manage your symptoms long-term with options such as but not limited to imaging, medications or physical therapy

## 2022-11-09 NOTE — ED Triage Notes (Signed)
Pt c/o lt upper shoulder pain radiating through to lt chest x2 wks. States this started after he had bronchitis 3 wks ago. States did a 6 lead EKG last night on his phone and it was normal. Denies injury and any other sx's. States took advil with no relief.

## 2022-11-11 ENCOUNTER — Encounter: Payer: Self-pay | Admitting: Gastroenterology

## 2022-11-28 ENCOUNTER — Encounter: Payer: Self-pay | Admitting: Internal Medicine

## 2022-11-28 ENCOUNTER — Ambulatory Visit: Payer: 59 | Admitting: Internal Medicine

## 2022-11-28 VITALS — BP 124/76 | HR 90 | Temp 98.2°F | Ht 69.0 in | Wt 257.0 lb

## 2022-11-28 DIAGNOSIS — E559 Vitamin D deficiency, unspecified: Secondary | ICD-10-CM | POA: Diagnosis not present

## 2022-11-28 DIAGNOSIS — Z0000 Encounter for general adult medical examination without abnormal findings: Secondary | ICD-10-CM | POA: Diagnosis not present

## 2022-11-28 DIAGNOSIS — M5412 Radiculopathy, cervical region: Secondary | ICD-10-CM | POA: Diagnosis not present

## 2022-11-28 DIAGNOSIS — R7303 Prediabetes: Secondary | ICD-10-CM | POA: Diagnosis not present

## 2022-11-28 DIAGNOSIS — Z125 Encounter for screening for malignant neoplasm of prostate: Secondary | ICD-10-CM | POA: Diagnosis not present

## 2022-11-28 DIAGNOSIS — R739 Hyperglycemia, unspecified: Secondary | ICD-10-CM

## 2022-11-28 DIAGNOSIS — Z0001 Encounter for general adult medical examination with abnormal findings: Secondary | ICD-10-CM

## 2022-11-28 DIAGNOSIS — E538 Deficiency of other specified B group vitamins: Secondary | ICD-10-CM

## 2022-11-28 DIAGNOSIS — E7800 Pure hypercholesterolemia, unspecified: Secondary | ICD-10-CM

## 2022-11-28 LAB — BASIC METABOLIC PANEL
BUN: 20 mg/dL (ref 6–23)
CO2: 27 mEq/L (ref 19–32)
Calcium: 9.5 mg/dL (ref 8.4–10.5)
Chloride: 105 mEq/L (ref 96–112)
Creatinine, Ser: 1.16 mg/dL (ref 0.40–1.50)
GFR: 67.02 mL/min (ref >60.00)
Glucose, Bld: 120 mg/dL — ABNORMAL HIGH (ref 70–99)
Potassium: 4 mEq/L (ref 3.5–5.1)
Sodium: 139 mEq/L (ref 135–145)

## 2022-11-28 LAB — URINALYSIS, ROUTINE W REFLEX MICROSCOPIC
Bilirubin Urine: NEGATIVE
Hgb urine dipstick: NEGATIVE
Ketones, ur: NEGATIVE
Leukocytes,Ua: NEGATIVE
Nitrite: NEGATIVE
RBC / HPF: NONE SEEN (ref 0–?)
Specific Gravity, Urine: 1.02 (ref 1.000–1.030)
Total Protein, Urine: NEGATIVE
Urine Glucose: NEGATIVE
Urobilinogen, UA: 1 (ref 0.0–1.0)
pH: 6.5 (ref 5.0–8.0)

## 2022-11-28 LAB — HEPATIC FUNCTION PANEL
ALT: 36 U/L (ref 0–53)
AST: 37 U/L (ref 0–37)
Albumin: 4.3 g/dL (ref 3.5–5.2)
Alkaline Phosphatase: 37 U/L — ABNORMAL LOW (ref 39–117)
Bilirubin, Direct: 0.2 mg/dL (ref 0.0–0.3)
Total Bilirubin: 0.6 mg/dL (ref 0.2–1.2)
Total Protein: 6.8 g/dL (ref 6.0–8.3)

## 2022-11-28 LAB — CBC WITH DIFFERENTIAL/PLATELET
Basophils Absolute: 0 10*3/uL (ref 0.0–0.1)
Basophils Relative: 1 % (ref 0.0–3.0)
Eosinophils Absolute: 0.1 10*3/uL (ref 0.0–0.7)
Eosinophils Relative: 3 % (ref 0.0–5.0)
HCT: 41.8 % (ref 39.0–52.0)
Hemoglobin: 14.3 g/dL (ref 13.0–17.0)
Lymphocytes Relative: 34.4 % (ref 12.0–46.0)
Lymphs Abs: 1.7 10*3/uL (ref 0.7–4.0)
MCHC: 34.2 g/dL (ref 30.0–36.0)
MCV: 92 fl (ref 78.0–100.0)
Monocytes Absolute: 0.3 10*3/uL (ref 0.1–1.0)
Monocytes Relative: 6.6 % (ref 3.0–12.0)
Neutro Abs: 2.7 10*3/uL (ref 1.4–7.7)
Neutrophils Relative %: 55 % (ref 43.0–77.0)
Platelets: 166 10*3/uL (ref 150.0–400.0)
RBC: 4.54 Mil/uL (ref 4.22–5.81)
RDW: 13.5 % (ref 11.5–15.5)
WBC: 5 10*3/uL (ref 4.0–10.5)

## 2022-11-28 LAB — LIPID PANEL
Cholesterol: 135 mg/dL (ref 0–200)
HDL: 42.8 mg/dL (ref >39.00)
NonHDL: 92.2
Total CHOL/HDL Ratio: 3
Triglycerides: 250 mg/dL — ABNORMAL HIGH (ref 0.0–149.0)
VLDL: 50 mg/dL — ABNORMAL HIGH (ref 0.0–40.0)

## 2022-11-28 LAB — HEMOGLOBIN A1C: Hgb A1c MFr Bld: 5.8 % (ref 4.6–6.5)

## 2022-11-28 LAB — PSA: PSA: 0.85 ng/mL (ref 0.10–4.00)

## 2022-11-28 LAB — LDL CHOLESTEROL, DIRECT: Direct LDL: 61 mg/dL

## 2022-11-28 LAB — VITAMIN B12: Vitamin B-12: 308 pg/mL (ref 211–911)

## 2022-11-28 LAB — TSH: TSH: 1.33 u[IU]/mL (ref 0.35–5.50)

## 2022-11-28 LAB — VITAMIN D 25 HYDROXY (VIT D DEFICIENCY, FRACTURES): VITD: 30.55 ng/mL (ref 30.00–100.00)

## 2022-11-28 MED ORDER — MOUNJARO 5 MG/0.5ML ~~LOC~~ SOAJ: 5.0000 mg | SUBCUTANEOUS | 3 refills | Status: DC

## 2022-11-28 MED ORDER — OXYCODONE HCL 5 MG PO CAPS: 5.0000 mg | ORAL_CAPSULE | ORAL | 0 refills | Status: DC | PRN

## 2022-11-28 NOTE — Progress Notes (Unsigned)
Patient ID: Harry Carroll, male   DOB: Sep 27, 1960, 62 y.o.   MRN: [id number]         Chief Complaint:: wellness exam and Back Pain (Upper back pain for the past couple of months and has gotten worse is going down left arm into finger and feels more like a nerve pain )  , preDM, hld, low vit d,        HPI:  Harry Carroll is a 62 y.o. male here for wellness exam; due for colonoscopy or cologuard but declines for now, declines covid booster, o/w up to date                        Also seen and tx for bronchitis may 2 with prednisone tx, but cough seemed to lead to may 29 ED visit with left upper back pain;  unfortnately now with more localized left neck pain with radiation to the left hand with pain, numbness, and weakness by the end of the day; pain worse to lying down at night, then better somewhat again the next am.  Pain now about 7.10.  Has seen Dr Juliann Pares NS a few years ago, with finding of cervical spine stenosis; improved with tx then, but now with worsening symptoms.  Last cervical MRI was 11 2017  Pt denies chest pain, increased sob or doe, wheezing, orthopnea, PND, increased LE swelling, palpitations, dizziness or syncope.   Pt denies polydipsia, polyuria, or new focal neuro s/s.  Pt denies fever, wt loss, night sweats, loss of appetite, or other constitutional symptoms  Has had some intermittent using mounjaro due to backorder, asks for restart, has been taking with PreDM for the past yr per wellness at [facility].     Wt Readings from Last 3 Encounters:  11/28/22 257 lb (116.6 kg)  08/10/22 251 lb 12.8 oz (114.2 kg)  04/13/22 236 lb 3.2 oz (107.1 kg)   BP Readings from Last 3 Encounters:  11/28/22 124/76  11/09/22 (!) 154/82  10/13/22 137/89   Immunization History  Administered Date(s) Administered   Hep A / Hep B 10/31/2019, 12/09/2019, 05/11/2020   Influenza Split 03/10/2011   Influenza,inj,Quad PF,6+ Mos 04/13/2013, 03/28/2016, 04/21/2017, 02/05/2019, 03/16/2020, 03/24/2022    Influenza-Unspecified 03/13/2018   PFIZER(Purple Top)SARS-COV-2 Vaccination 09/19/2019, 10/15/2019, 04/27/2020   Pneumococcal Polysaccharide-23 11/24/2011   Td 06/14/1999, 01/29/2010   Td (Adult), 2 Lf Tetanus Toxid, Preservative Free 06/14/1999, 01/29/2010   Tdap 04/26/2019   Zoster Recombinat (Shingrix) 06/26/2019, 08/22/2019   Health Maintenance Due  Topic Date Due   Colonoscopy  11/26/2022      Past Medical History:  Diagnosis Date   Arthritis    Bipolar 1 disorder (HCC)    Dr Jennelle Human , pt denies   Carotid stenosis    Carpal tunnel syndrome    saw Dr Onnie Graham , pt unaware   Cervical spinal stenosis    Colon polyps    adenomatous   Complication of anesthesia    trouble placing catheter with last surgeery, had to get urologist   Diverticulitis    Diverticulosis    Heart murmur    pt denies   History of hiatal hernia    resolved   Hyperlipemia    Insomnia    related to pain   Normal cardiac stress test    CP 01-2009---Nl ECHO and stress test neg   Numbness of fingers of both hands    when sleeps, right hand worse than left  PVC (premature ventricular contraction)    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   Restless leg    Sleep apnea    does not use cpap due to does not like   Past Surgical History:  Procedure Laterality Date   COLONOSCOPY WITH PROPOFOL N/A 04/02/2014   Procedure: COLONOSCOPY WITH PROPOFOL;  Surgeon: Louis Meckel, MD;  Location: [facility];  Service: Endoscopy;  Laterality: N/A;   CYSTOSCOPY WITH URETHRAL DILATATION  12/20/2011   Procedure: CYSTOSCOPY WITH URETHRAL DILATATION;  Surgeon: Velora Heckler, MD;  Location: [facility];  Service: General;;  with foley insertion   CYSTOSCOPY WITH URETHRAL DILATATION  12/20/2011   Procedure: CYSTOSCOPY WITH URETHRAL DILATATION;  Surgeon: Marcine Matar, MD;  [facility]: [facility];  Service: Urology;;   CYSTOSCOPY WITH URETHRAL DILATATION N/A 12/15/2017   Procedure: CYSTOSCOPY WITH URETHRAL DILATATION;  Surgeon:  Bjorn Pippin, MD;  Location: [facility];  Service: Urology;  Laterality: N/A;   HOT HEMOSTASIS N/A 04/02/2014   Procedure: HOT HEMOSTASIS (ARGON PLASMA COAGULATION/BICAP);  Surgeon: Louis Meckel, MD;  Location: [facility];  Service: Endoscopy;  Laterality: N/A;   maxillofacial surgery     PARTIAL COLECTOMY  12/20/2011   Procedure: PARTIAL COLECTOMY;  Surgeon: Velora Heckler, MD;  Location: [facility];  Service: General;  Laterality: N/A;  Sigmoid colectomy   right knee arthroscopy     2007 x2   right shoulder surgery     X2   SHOULDER SURGERY     LEFT   TOTAL KNEE ARTHROPLASTY Left 12/15/2017   Procedure: LEFT TOTAL KNEE ARTHROPLASTY;  Surgeon: Eugenia Mcalpine, MD;  Location: [facility];  Service: Orthopedics;  Laterality: Left;  Adductor Block   TOTAL KNEE ARTHROPLASTY Right 05/18/2018   Procedure: TOTAL KNEE ARTHROPLASTY;  Surgeon: Eugenia Mcalpine, MD;  [facility];  Service: Orthopedics;  Laterality: Right;  with block    reports that he quit smoking about 4 years ago. His smoking use included cigars. He quit smokeless tobacco use about 4 years ago.  His smokeless tobacco use included snuff. He reports current alcohol use. He reports that he does not use drugs. family history includes Alcohol abuse in his father and sister; Cancer in his paternal aunt; Liver disease in his mother; Prostate cancer in his paternal grandfather. Allergies  Allergen Reactions   Ciprofloxacin Shortness Of Breath, Nausea And Vomiting, Nausea Only, Rash and Other (See Comments)    Marked mucous production   Dilaudid [Hydromorphone Hcl] Shortness Of Breath, Nausea Only and Other (See Comments)    Patient developed rash, excessive mucous production and chest pain - tolerates morphine   Hydrocodone Itching   Current Outpatient Medications on File Prior to Visit  Medication Sig Dispense Refill   albuterol (VENTOLIN HFA) 108 (90 Base) MCG/ACT inhaler Inhale 2 puffs into the lungs every 4 (four) hours as needed for  wheezing or shortness of breath. 1 each 0   Evolocumab (REPATHA SURECLICK) 140 MG/ML SOAJ Inject 140 mg into the skin every 14 (fourteen) days. 2 mL 11   aspirin EC 81 MG tablet Take 1 tablet (81 mg total) by mouth daily. Swallow whole. (Patient not taking: Reported on 11/28/2022) 30 tablet 12   cyclobenzaprine (FLEXERIL) 10 MG tablet Take 1 tablet (10 mg total) by mouth at bedtime. (Patient not taking: Reported on 11/28/2022) 10 tablet 0   predniSONE (STERAPRED UNI-PAK 21 TAB) 10 MG (21) TBPK tablet Take by mouth daily. Take 6 tabs by mouth daily  for 1 days,  then 5 tabs for 1 days, then 4 tabs for 1 days, then 3 tabs for 1 days, 2 tabs for 1 days, then 1 tab by mouth daily for 1 days (Patient not taking: Reported on 11/28/2022) 21 tablet 0   No current facility-administered medications on file prior to visit.        ROS:  All others reviewed and negative.  Objective        PE:  BP 124/76 (BP Location: Right Arm, Patient Position: Sitting, Cuff Size: Normal)   Pulse 90   Temp 98.2 F (36.8 C) (Oral)   Ht 5\' 9"  (1.753 m)   Wt 257 lb (116.6 kg)   SpO2 98%   BMI 37.95 kg/m                 Constitutional: Pt appears in NAD               HENT: Head: NCAT.                Right Ear: External ear normal.                 Left Ear: External ear normal.                Eyes: . Pupils are equal, round, and reactive to light. Conjunctivae and EOM are normal               Nose: without d/c or deformity               Neck: Neck supple. Gross normal ROM               Cardiovascular: Normal rate and regular rhythm.                 Pulmonary/Chest: Effort normal and breath sounds without rales or wheezing.                Abd:  Soft, NT, ND, + BS, no organomegaly               Neurological: Pt is alert. At baseline orientation, motor with 4/5 LUE motor weakness               Skin: Skin is warm. No rashes, no other new lesions, LE edema - none               Psychiatric: Pt behavior is normal without  agitation   Micro: none  Cardiac tracings I have personally interpreted today:  none  Pertinent Radiological findings (summarize): none   Lab Results  Component Value Date   WBC 5.0 11/28/2022   HGB 14.3 11/28/2022   HCT 41.8 11/28/2022   PLT 166.0 11/28/2022   GLUCOSE 120 (H) 11/28/2022   CHOL 135 11/28/2022   TRIG 250.0 (H) 11/28/2022   HDL 42.80 11/28/2022   LDLDIRECT 61.0 11/28/2022   LDLCALC 70 08/10/2022   ALT 36 11/28/2022   AST 37 11/28/2022   NA 139 11/28/2022   K 4.0 11/28/2022   CL 105 11/28/2022   CREATININE 1.16 11/28/2022   BUN 20 11/28/2022   CO2 27 11/28/2022   TSH 1.33 11/28/2022   PSA 0.85 11/28/2022   INR 1.0 08/18/2021   HGBA1C 5.8 11/28/2022   Assessment/Plan:  Harry Carroll is a 62 y.o. White or Caucasian [1] male with  has a past medical history of Arthritis, Bipolar 1 disorder (HCC), Carotid stenosis, Carpal tunnel syndrome, Cervical spinal stenosis, Colon polyps, Complication of  anesthesia, Diverticulitis, Diverticulosis, Heart murmur, History of hiatal hernia, Hyperlipemia, Insomnia, Normal cardiac stress test, Numbness of fingers of both hands, PVC (premature ventricular contraction), Restless leg, and Sleep apnea.  Encounter for well adult exam with abnormal findings Age and sex appropriate education and counseling updated with regular exercise and diet Referrals for preventative services - declines colonoscopy for now Immunizations addressed - declines covid booster Smoking counseling  - none needed Evidence for depression or other mood disorder - stable anxiety depression bipolar symtpoms Most recent labs reviewed. I have personally reviewed and have noted: 1) the patient's medical and social history 2) The patient's current medications and supplements 3) The patient's height, weight, and BMI have been recorded in the chart   Hyperlipidemia Lab Results  Component Value Date   LDLCALC 70 08/10/2022   Uncontrolled, goal ldl < 70, pt  to continue repatha 140 q 2 wks, delcines other zetia for now   Prediabetes Lab Results  Component Value Date   HGBA1C 5.8 11/28/2022   Stable, pt to continue current medical treatment mounjaro 5 mg weekly   Vitamin D deficiency Last vitamin D Lab Results  Component Value Date   VD25OH 30.55 11/28/2022   Low, to start oral replacement   Left cervical radiculopathy Recent onset worsening with mod to severe pain and motor weakness - for oxycodone 5 mg prn, MRI c spine, and refer NS - Dr Yetta Barre  Followup: Return in about 6 months (around 05/30/2023).  Oliver Barre, MD 11/30/2022 10:40 AM [facility]

## 2022-11-28 NOTE — Progress Notes (Signed)
The test results show that your current treatment is OK, as the tests are stable.  Please continue the same plan.  There is no other need for change of treatment or further evaluation based on these results, at this time.  thanks 

## 2022-11-28 NOTE — Patient Instructions (Addendum)
Please take all new medication as prescribed - the oxycodone 5 mg as needed  Please call in 1 wk if you need further medication, as we are only allowed to give 1 wk supply to start  Please continue all other medications as before, and refills have been done if requested - the mounjaro 5 mg  Please have the pharmacy call with any other refills you may need.  Please continue your efforts at being more active, low cholesterol diet, and weight control.  You are otherwise up to date with prevention measures today.  Please keep your appointments with your specialists as you may have planned  You will be contacted regarding the referral for: MRI for the neck, and Dr Yetta Barre Neurosurgury  Please go to the LAB at the blood drawing area for the tests to be done  You will be contacted by phone if any changes need to be made immediately.  Otherwise, you will receive a letter about your results with an explanation, but please check with MyChart first.  Please remember to sign up for MyChart if you have not done so, as this will be important to you in the future with finding out test results, communicating by private email, and scheduling acute appointments online when needed.  Please make an Appointment to return in 6 months, or sooner if needed

## 2022-11-30 ENCOUNTER — Encounter: Payer: Self-pay | Admitting: Internal Medicine

## 2022-11-30 DIAGNOSIS — M5412 Radiculopathy, cervical region: Secondary | ICD-10-CM | POA: Insufficient documentation

## 2022-11-30 NOTE — Assessment & Plan Note (Signed)
Lab Results  Component Value Date   HGBA1C 5.8 11/28/2022   Stable, pt to continue current medical treatment mounjaro 5 mg weekly

## 2022-11-30 NOTE — Assessment & Plan Note (Signed)
Lab Results  Component Value Date   LDLCALC 70 08/10/2022   Uncontrolled, goal ldl < 70, pt to continue repatha 140 q 2 wks, delcines other zetia for now

## 2022-11-30 NOTE — Assessment & Plan Note (Signed)
Last vitamin D Lab Results  Component Value Date   VD25OH 30.55 11/28/2022   Low, to start oral replacement

## 2022-11-30 NOTE — Assessment & Plan Note (Signed)
Recent onset worsening with mod to severe pain and motor weakness - for oxycodone 5 mg prn, MRI c spine, and refer NS - Dr Yetta Barre

## 2022-11-30 NOTE — Assessment & Plan Note (Addendum)
Age and sex appropriate education and counseling updated with regular exercise and diet Referrals for preventative services - declines colonoscopy for now Immunizations addressed - declines covid booster Smoking counseling  - none needed Evidence for depression or other mood disorder - stable anxiety depression bipolar symtpoms Most recent labs reviewed. I have personally reviewed and have noted: 1) the patient's medical and social history 2) The patient's current medications and supplements 3) The patient's height, weight, and BMI have been recorded in the chart

## 2022-12-19 ENCOUNTER — Ambulatory Visit
Admission: RE | Admit: 2022-12-19 | Discharge: 2022-12-19 | Disposition: A | Discharge status: Home / Self Care | Payer: 59 | Source: Ambulatory Visit | Attending: Internal Medicine | Admitting: Internal Medicine

## 2022-12-19 DIAGNOSIS — M5412 Radiculopathy, cervical region: Secondary | ICD-10-CM

## 2022-12-20 ENCOUNTER — Encounter: Payer: Self-pay | Admitting: Internal Medicine

## 2022-12-20 MED ORDER — AZITHROMYCIN 250 MG PO TABS: ORAL_TABLET | ORAL | 1 refills | Status: AC

## 2022-12-21 ENCOUNTER — Encounter: Payer: Self-pay | Admitting: Internal Medicine

## 2023-01-15 ENCOUNTER — Encounter: Payer: Self-pay | Admitting: Internal Medicine

## 2023-01-16 MED ORDER — NIRMATRELVIR/RITONAVIR (PAXLOVID)TABLET: 3.0000 | ORAL_TABLET | Freq: Two times a day (BID) | ORAL | 0 refills | Status: AC

## 2023-02-01 ENCOUNTER — Telehealth: Payer: Self-pay

## 2023-02-01 DIAGNOSIS — K760 Fatty (change of) liver, not elsewhere classified: Secondary | ICD-10-CM

## 2023-02-01 DIAGNOSIS — K7460 Unspecified cirrhosis of liver: Secondary | ICD-10-CM

## 2023-02-01 DIAGNOSIS — R932 Abnormal findings on diagnostic imaging of liver and biliary tract: Secondary | ICD-10-CM

## 2023-02-01 NOTE — Telephone Encounter (Signed)
-----   Message from [facility] sent at 08/16/2022  8:10 AM EST ----- Regarding: FW: LFTs due in Aug Patient will be due for LFTs  02-08-23   ----- Message ----- From: Benancio Deeds, MD Sent: 08/15/2022   6:16 PM EST To: Cooper Render, CMA Subject: RE: LFTs                                       They look good, can you please place a recall for 6 months? Thanks  ----- Message ----- From: Cooper Render, CMA Sent: 08/15/2022   1:20 PM EST To: Benancio Deeds, MD Subject: LFTs                                           Please see recent LFTs for pt

## 2023-02-01 NOTE — Telephone Encounter (Signed)
MyChart message to patient to go to the lab one day next week

## 2023-02-03 NOTE — Progress Notes (Unsigned)
HPI: FU CP. Stress echo 12/18 technically difficult but felt to likely be normal.  Patient developed COVID in June 2020.  Echocardiogram August 2020 showed normal LV function.  Carotid Dopplers October 2023 showed 1 to 39% right and near normal left.  Follow-up recommended 12 months.  Calcium score October 2023 680 which was 91st percentile.  Since last seen,    Current Outpatient Medications  Medication Sig Dispense Refill   albuterol (VENTOLIN HFA) 108 (90 Base) MCG/ACT inhaler Inhale 2 puffs into the lungs every 4 (four) hours as needed for wheezing or shortness of breath. 1 each 0   aspirin EC 81 MG tablet Take 1 tablet (81 mg total) by mouth daily. Swallow whole. (Patient not taking: Reported on 11/28/2022) 30 tablet 12   cyclobenzaprine (FLEXERIL) 10 MG tablet Take 1 tablet (10 mg total) by mouth at bedtime. (Patient not taking: Reported on 11/28/2022) 10 tablet 0   Evolocumab (REPATHA SURECLICK) 140 MG/ML SOAJ Inject 140 mg into the skin every 14 (fourteen) days. 2 mL 11   oxycodone (OXY-IR) 5 MG capsule Take 1 capsule (5 mg total) by mouth every 4 (four) hours as needed. 30 capsule 0   predniSONE (STERAPRED UNI-PAK 21 TAB) 10 MG (21) TBPK tablet Take by mouth daily. Take 6 tabs by mouth daily  for 1 days, then 5 tabs for 1 days, then 4 tabs for 1 days, then 3 tabs for 1 days, 2 tabs for 1 days, then 1 tab by mouth daily for 1 days (Patient not taking: Reported on 11/28/2022) 21 tablet 0   tirzepatide (MOUNJARO) 5 MG/0.5ML Pen Inject 5 mg into the skin once a week. 6 mL 3   No current facility-administered medications for this visit.     Past Medical History:  Diagnosis Date   Arthritis    Bipolar 1 disorder (HCC)    Dr Jennelle Human , pt denies   Carotid stenosis    Carpal tunnel syndrome    saw Dr Onnie Graham , pt unaware   Cervical spinal stenosis    Colon polyps    adenomatous   Complication of anesthesia    trouble placing catheter with last surgeery, had to get urologist    Diverticulitis    Diverticulosis    Heart murmur    pt denies   History of hiatal hernia    resolved   Hyperlipemia    Insomnia    related to pain   Normal cardiac stress test    CP 01-2009---Nl ECHO and stress test neg   Numbness of fingers of both hands    when sleeps, right hand worse than left   PVC (premature ventricular contraction)    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   Restless leg    Sleep apnea    does not use cpap due to does not like    Past Surgical History:  Procedure Laterality Date   COLONOSCOPY WITH PROPOFOL N/A 04/02/2014   Procedure: COLONOSCOPY WITH PROPOFOL;  Surgeon: Louis Meckel, MD;  [facility]: [facility];  Service: Endoscopy;  Laterality: N/A;   CYSTOSCOPY WITH URETHRAL DILATATION  12/20/2011   Procedure: CYSTOSCOPY WITH URETHRAL DILATATION;  Surgeon: Velora Heckler, MD;  [facility]: [facility];  Service: General;;  with foley insertion   CYSTOSCOPY WITH URETHRAL DILATATION  12/20/2011   Procedure: CYSTOSCOPY WITH URETHRAL DILATATION;  Surgeon: Marcine Matar, MD;  [facility]: [facility];  Service: Urology;;   CYSTOSCOPY WITH URETHRAL DILATATION N/A 12/15/2017   Procedure:  CYSTOSCOPY WITH URETHRAL DILATATION;  Surgeon: Bjorn Pippin, MD;  [facility];  Service: Urology;  Laterality: N/A;   HOT HEMOSTASIS N/A 04/02/2014   Procedure: HOT HEMOSTASIS (ARGON PLASMA COAGULATION/BICAP);  Surgeon: Louis Meckel, MD;  Location: [facility];  Service: Endoscopy;  Laterality: N/A;   maxillofacial surgery     PARTIAL COLECTOMY  12/20/2011   Procedure: PARTIAL COLECTOMY;  Surgeon: Velora Heckler, MD;  [facility]: [facility];  Service: General;  Laterality: N/A;  Sigmoid colectomy   right knee arthroscopy     2007 x2   right shoulder surgery     X2   SHOULDER SURGERY     LEFT   TOTAL KNEE ARTHROPLASTY Left 12/15/2017   Procedure: LEFT TOTAL KNEE ARTHROPLASTY;  Surgeon: Eugenia Mcalpine, MD;  Location: [facility];  Service: Orthopedics;  Laterality: Left;  Adductor Block   TOTAL  KNEE ARTHROPLASTY Right 05/18/2018   Procedure: TOTAL KNEE ARTHROPLASTY;  Surgeon: Eugenia Mcalpine, MD;  Location: [facility];  Service: Orthopedics;  Laterality: Right;  with block    Social History   Socioeconomic History   Marital status: Married    Spouse name: Not on file   Number of children: 4   Years of education: 14   Highest education level: Not on file  Occupational History   Occupation: Software    Employer: [employer]  Tobacco Use   Smoking status: Former    Types: Cigars    Quit date: 06/13/2018    Years since quitting: 4.6   Smokeless tobacco: Former    Types: Snuff    Quit date: 12/10/2017   Tobacco comments:    1 per week  Vaping Use   Vaping status: Never Used  Substance and Sexual Activity   Alcohol use: Yes    Comment:  a couple beers a few times a week    Drug use: No    Comment: denies THC, cocainse, stimulants, pain meds, benzos, synthetic drugs   Sexual activity: Not Currently  Other Topics Concern   Not on file  Social History Narrative   Born in [place] and grew up in [place]. Currently resides in a house with his wife. 1 dog, 1 snake, 1 cat. 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    Denies religious beliefs that would effect health care.    Social Determinants of Health   Financial Resource Strain: Not on file  Food Insecurity: Not on file  Transportation Needs: Not on file  Physical Activity: Not on file  Stress: Not on file  Social Connections: Not on file  Intimate Partner Violence: Not on file    Family History  Problem Relation Age of Onset   Prostate cancer Paternal Grandfather    Cancer Paternal Aunt        lymph cancer   Liver disease Mother    Alcohol abuse Father    Alcohol abuse Sister    Colon cancer Neg Hx    CAD Neg Hx    Pancreatic cancer Neg Hx    Rectal cancer Neg Hx    Stomach cancer Neg Hx    Drug abuse Neg Hx    Anxiety disorder Neg Hx    Bipolar disorder Neg Hx    Depression Neg Hx     ROS: no fevers  or chills, productive cough, hemoptysis, dysphasia, odynophagia, melena, hematochezia, dysuria, hematuria, rash, seizure activity, orthopnea, PND, pedal edema, claudication. Remaining systems are negative.  Physical Exam: Well-developed well-nourished in no acute distress.  Skin is warm and dry.  HEENT is normal.  Neck is supple.  Chest is clear to auscultation with normal expansion.  Cardiovascular exam is regular rate and rhythm.  Abdominal exam nontender or distended. No masses palpated. Extremities show no edema. neuro grossly intact  ECG- personally reviewed  A/P  1 coronary artery disease-based on previous elevation in calcium score.  Patient denies chest pain.  Continue aspirin.  Intolerant to statins.  2 cerebrovascular disease-plan follow-up carotid Dopplers October 2024.  3 hyperlipidemia-continue Repatha.  Intolerant to statins.  Olga Millers, MD

## 2023-02-09 ENCOUNTER — Encounter: Payer: Self-pay | Admitting: Cardiology

## 2023-02-09 ENCOUNTER — Ambulatory Visit: Payer: 59 | Attending: Cardiology | Admitting: Cardiology

## 2023-02-09 VITALS — BP 120/72 | HR 71 | Ht 69.0 in | Wt 248.0 lb

## 2023-02-09 DIAGNOSIS — I6523 Occlusion and stenosis of bilateral carotid arteries: Secondary | ICD-10-CM

## 2023-02-09 DIAGNOSIS — I2510 Atherosclerotic heart disease of native coronary artery without angina pectoris: Secondary | ICD-10-CM

## 2023-02-09 DIAGNOSIS — E785 Hyperlipidemia, unspecified: Secondary | ICD-10-CM | POA: Diagnosis not present

## 2023-02-09 NOTE — Patient Instructions (Addendum)
Medication Instructions:  No changes *If you need a refill on your cardiac medications before your next appointment, please call your pharmacy*  Procedures: Your physician has requested that you have a carotid duplex in October. This test is an ultrasound of the carotid arteries in your neck. It looks at blood flow through these arteries that supply the brain with blood. Allow one hour for this exam. There are no restrictions or special instructions.   Follow-Up: At [facility], you and your health needs are our priority.  As part of our continuing mission to provide you with exceptional heart care, we have created designated Provider Care Teams.  These Care Teams include your primary Cardiologist (physician) and Advanced Practice Providers (APPs -  Physician Assistants and Nurse Practitioners) who all work together to provide you with the care you need, when you need it.  We recommend signing up for the patient portal called "MyChart".  Sign up information is provided on this After Visit Summary.  MyChart is used to connect with patients for Virtual Visits (Telemedicine).  Patients are able to view lab/test results, encounter notes, upcoming appointments, etc.  Non-urgent messages can be sent to your provider as well.   To learn more about what you can do with MyChart, go to [email or web address].    Your next appointment:   1 year(s)  Provider:   Olga Millers, MD

## 2023-03-29 ENCOUNTER — Ambulatory Visit (HOSPITAL_COMMUNITY)
Admission: RE | Admit: 2023-03-29 | Discharge: 2023-03-29 | Disposition: A | Discharge status: Home / Self Care | Payer: 59 | Source: Ambulatory Visit | Attending: Cardiology | Admitting: Cardiology

## 2023-03-29 ENCOUNTER — Encounter: Payer: Self-pay | Admitting: Internal Medicine

## 2023-03-29 ENCOUNTER — Ambulatory Visit: Payer: 59 | Admitting: Internal Medicine

## 2023-03-29 VITALS — BP 130/80 | HR 78 | Temp 98.2°F | Ht 69.0 in | Wt 252.0 lb

## 2023-03-29 DIAGNOSIS — Z125 Encounter for screening for malignant neoplasm of prostate: Secondary | ICD-10-CM

## 2023-03-29 DIAGNOSIS — E538 Deficiency of other specified B group vitamins: Secondary | ICD-10-CM

## 2023-03-29 DIAGNOSIS — R7303 Prediabetes: Secondary | ICD-10-CM | POA: Diagnosis not present

## 2023-03-29 DIAGNOSIS — Z23 Encounter for immunization: Secondary | ICD-10-CM | POA: Diagnosis not present

## 2023-03-29 DIAGNOSIS — F320 Major depressive disorder, single episode, mild: Secondary | ICD-10-CM

## 2023-03-29 DIAGNOSIS — E7800 Pure hypercholesterolemia, unspecified: Secondary | ICD-10-CM

## 2023-03-29 DIAGNOSIS — E559 Vitamin D deficiency, unspecified: Secondary | ICD-10-CM

## 2023-03-29 DIAGNOSIS — I6523 Occlusion and stenosis of bilateral carotid arteries: Secondary | ICD-10-CM | POA: Diagnosis not present

## 2023-03-29 MED ORDER — TIRZEPATIDE 7.5 MG/0.5ML ~~LOC~~ SOAJ: 7.5000 mg | SUBCUTANEOUS | 3 refills | Status: DC

## 2023-03-29 NOTE — Progress Notes (Signed)
Patient ID: Harry Carroll, male   DOB: 25-Nov-1960, 61 y.o.   MRN: [id number]        Chief Complaint: follow up, HLD and hyperglycemia, low vit d       HPI:  Harry Carroll is a 62 y.o. male here overall doing ok,  Pt denies chest pain, increased sob or doe, wheezing, orthopnea, PND, increased LE swelling, palpitations, dizziness or syncope. Ran out of mounjaro, willing for increase as not yet losing wt.   Denies worsening depressive symptoms, suicidal ideation, or panic; Wt Readings from Last 3 Encounters:  03/29/23 252 lb (114.3 kg)  02/09/23 248 lb (112.5 kg)  11/28/22 257 lb (116.6 kg)   BP Readings from Last 3 Encounters:  03/29/23 130/80  02/09/23 120/72  11/28/22 124/76         Past Medical History:  Diagnosis Date   Arthritis    Bipolar 1 disorder (HCC)    Dr Jennelle Human , pt denies   Carotid stenosis    Carpal tunnel syndrome    saw Dr Onnie Graham , pt unaware   Cervical spinal stenosis    Colon polyps    adenomatous   Complication of anesthesia    trouble placing catheter with last surgeery, had to get urologist   Diverticulitis    Diverticulosis    Heart murmur    pt denies   History of hiatal hernia    resolved   Hyperlipemia    Insomnia    related to pain   Normal cardiac stress test    CP 01-2009---Nl ECHO and stress test neg   Numbness of fingers of both hands    when sleeps, right hand worse than left   PVC (premature ventricular contraction)    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   Restless leg    Sleep apnea    does not use cpap due to does not like   Past Surgical History:  Procedure Laterality Date   COLONOSCOPY WITH PROPOFOL N/A 04/02/2014   Procedure: COLONOSCOPY WITH PROPOFOL;  Surgeon: Louis Meckel, MD;  Location: [facility];  Service: Endoscopy;  Laterality: N/A;   CYSTOSCOPY WITH URETHRAL DILATATION  12/20/2011   Procedure: CYSTOSCOPY WITH URETHRAL DILATATION;  Surgeon: Velora Heckler, MD;  Location: [facility];  Service: General;;  with foley  insertion   CYSTOSCOPY WITH URETHRAL DILATATION  12/20/2011   Procedure: CYSTOSCOPY WITH URETHRAL DILATATION;  Surgeon: Marcine Matar, MD;  Location: [facility];  Service: Urology;;   CYSTOSCOPY WITH URETHRAL DILATATION N/A 12/15/2017   Procedure: CYSTOSCOPY WITH URETHRAL DILATATION;  Surgeon: Bjorn Pippin, MD;  Location: [facility];  Service: Urology;  Laterality: N/A;   HOT HEMOSTASIS N/A 04/02/2014   Procedure: HOT HEMOSTASIS (ARGON PLASMA COAGULATION/BICAP);  Surgeon: Louis Meckel, MD;  Location: [facility];  Service: Endoscopy;  Laterality: N/A;   maxillofacial surgery     PARTIAL COLECTOMY  12/20/2011   Procedure: PARTIAL COLECTOMY;  Surgeon: Velora Heckler, MD;  Location: [facility];  Service: General;  Laterality: N/A;  Sigmoid colectomy   right knee arthroscopy     2007 x2   right shoulder surgery     X2   SHOULDER SURGERY     LEFT   TOTAL KNEE ARTHROPLASTY Left 12/15/2017   Procedure: LEFT TOTAL KNEE ARTHROPLASTY;  Surgeon: Eugenia Mcalpine, MD;  Location: [facility];  Service: Orthopedics;  Laterality: Left;  Adductor Block   TOTAL KNEE ARTHROPLASTY Right 05/18/2018   Procedure: TOTAL KNEE ARTHROPLASTY;  Surgeon:  Eugenia Mcalpine, MD;  Location: [facility];  Service: Orthopedics;  Laterality: Right;  with block    reports that he quit smoking about 4 years ago. His smoking use included cigars. He quit smokeless tobacco use about 5 years ago.  His smokeless tobacco use included snuff. He reports current alcohol use. He reports that he does not use drugs. family history includes Alcohol abuse in his father and sister; Cancer in his paternal aunt; Liver disease in his mother; Prostate cancer in his paternal grandfather. Allergies  Allergen Reactions   Ciprofloxacin Shortness Of Breath, Nausea And Vomiting, Nausea Only, Rash and Other (See Comments)    Marked mucous production   Dilaudid [Hydromorphone Hcl] Shortness Of Breath, Nausea Only and Other (See Comments)    Patient developed rash, excessive  mucous production and chest pain - tolerates morphine   Hydrocodone Itching   Current Outpatient Medications on File Prior to Visit  Medication Sig Dispense Refill   albuterol (VENTOLIN HFA) 108 (90 Base) MCG/ACT inhaler Inhale 2 puffs into the lungs every 4 (four) hours as needed for wheezing or shortness of breath. 1 each 0   aspirin EC 81 MG tablet Take 1 tablet (81 mg total) by mouth daily. Swallow whole. 30 tablet 12   Evolocumab (REPATHA SURECLICK) 140 MG/ML SOAJ Inject 140 mg into the skin every 14 (fourteen) days. 2 mL 11   oxycodone (OXY-IR) 5 MG capsule Take 1 capsule (5 mg total) by mouth every 4 (four) hours as needed. (Patient not taking: Reported on 03/29/2023) 30 capsule 0   No current facility-administered medications on file prior to visit.        ROS:  All others reviewed and negative.  Objective        PE:  BP 130/80 (BP Location: Left Arm, Patient Position: Sitting, Cuff Size: Normal)   Pulse 78   Temp 98.2 F (36.8 C) (Oral)   Ht 5\' 9"  (1.753 m)   Wt 252 lb (114.3 kg)   SpO2 98%   BMI 37.21 kg/m                 Constitutional: Pt appears in NAD               HENT: Head: NCAT.                Right Ear: External ear normal.                 Left Ear: External ear normal.                Eyes: . Pupils are equal, round, and reactive to light. Conjunctivae and EOM are normal               Nose: without d/c or deformity               Neck: Neck supple. Gross normal ROM               Cardiovascular: Normal rate and regular rhythm.                 Pulmonary/Chest: Effort normal and breath sounds without rales or wheezing.                Abd:  Soft, NT, ND, + BS, no organomegaly               Neurological: Pt is alert. At baseline orientation, motor grossly intact  Skin: Skin is warm. No rashes, no other new lesions, LE edema - none               Psychiatric: Pt behavior is normal without agitation   Micro: none  Cardiac tracings I have personally  interpreted today:  none  Pertinent Radiological findings (summarize): none   Lab Results  Component Value Date   WBC 5.0 11/28/2022   HGB 14.3 11/28/2022   HCT 41.8 11/28/2022   PLT 166.0 11/28/2022   GLUCOSE 120 (H) 11/28/2022   CHOL 135 11/28/2022   TRIG 250.0 (H) 11/28/2022   HDL 42.80 11/28/2022   LDLDIRECT 61.0 11/28/2022   LDLCALC 70 08/10/2022   ALT 36 11/28/2022   AST 37 11/28/2022   NA 139 11/28/2022   K 4.0 11/28/2022   CL 105 11/28/2022   CREATININE 1.16 11/28/2022   BUN 20 11/28/2022   CO2 27 11/28/2022   TSH 1.33 11/28/2022   PSA 0.85 11/28/2022   INR 1.0 08/18/2021   HGBA1C 5.8 11/28/2022   Assessment/Plan:  ARSENIY LOSACCO is a 62 y.o. White or Caucasian [1] male with  has a past medical history of Arthritis, Bipolar 1 disorder (HCC), Carotid stenosis, Carpal tunnel syndrome, Cervical spinal stenosis, Colon polyps, Complication of anesthesia, Diverticulitis, Diverticulosis, Heart murmur, History of hiatal hernia, Hyperlipemia, Insomnia, Normal cardiac stress test, Numbness of fingers of both hands, PVC (premature ventricular contraction), Restless leg, and Sleep apnea.  Hyperlipidemia Lab Results  Component Value Date   LDLCALC 70 08/10/2022   Uncontrolled, goal ldl < 70,, pt to continue current repatha, declines add zetia for now   Prediabetes Lab Results  Component Value Date   HGBA1C 5.8 11/28/2022   Stable, pt for increased mounjaro to 7.5 mg weekly   Vitamin D deficiency Last vitamin D Lab Results  Component Value Date   VD25OH 30.55 11/28/2022   Low, to start oral replacement   Depression Stable overall, declines need for further tx or counseling at this time  Followup: Return in about 6 months (around 09/27/2023).  Oliver Barre, MD 04/01/2023 7:51 PM [facility]

## 2023-03-29 NOTE — Patient Instructions (Addendum)
Ok to increase the mounjaro to 7.5 mg weekly  Please call in 1 month for the next higher dose if not losing wt, and still tolerating well  Please continue all other medications as before, and refills have been done if requested.  Please have the pharmacy call with any other refills you may need.  Please continue your efforts at being more active, low cholesterol diet, and weight control.  Please keep your appointments with your specialists as you may have planned  Please make an Appointment to return in 6 months, or sooner if needed, also with Lab Appointment for testing done 3-5 days before at the FIRST FLOOR Lab (so this is for TWO appointments - please see the scheduling desk as you leave)

## 2023-04-01 ENCOUNTER — Encounter: Payer: Self-pay | Admitting: Internal Medicine

## 2023-04-01 NOTE — Assessment & Plan Note (Signed)
Last vitamin D Lab Results  Component Value Date   VD25OH 30.55 11/28/2022   Low, to start oral replacement

## 2023-04-01 NOTE — Assessment & Plan Note (Signed)
Lab Results  Component Value Date   LDLCALC 70 08/10/2022   Uncontrolled, goal ldl < 70,, pt to continue current repatha, declines add zetia for now

## 2023-04-01 NOTE — Assessment & Plan Note (Signed)
Lab Results  Component Value Date   HGBA1C 5.8 11/28/2022   Stable, pt for increased mounjaro to 7.5 mg weekly

## 2023-04-01 NOTE — Assessment & Plan Note (Signed)
Stable overall, declines need for further tx or counseling at this time

## 2023-04-07 ENCOUNTER — Other Ambulatory Visit: Payer: Self-pay | Admitting: Cardiology

## 2023-04-10 ENCOUNTER — Encounter: Payer: Self-pay | Admitting: *Deleted

## 2023-05-02 ENCOUNTER — Encounter: Payer: Self-pay | Admitting: Gastroenterology

## 2023-06-21 ENCOUNTER — Ambulatory Visit (AMBULATORY_SURGERY_CENTER): Payer: 59

## 2023-06-21 VITALS — Ht 69.0 in | Wt 230.0 lb

## 2023-06-21 DIAGNOSIS — Z860100 Personal history of colon polyps, unspecified: Secondary | ICD-10-CM

## 2023-06-21 MED ORDER — NA SULFATE-K SULFATE-MG SULF 17.5-3.13-1.6 GM/177ML PO SOLN: 1.0000 | Freq: Once | ORAL | 0 refills | Status: AC

## 2023-06-21 NOTE — Progress Notes (Signed)

## 2023-06-30 ENCOUNTER — Encounter: Payer: Self-pay | Admitting: Internal Medicine

## 2023-06-30 DIAGNOSIS — R5383 Other fatigue: Secondary | ICD-10-CM

## 2023-07-06 ENCOUNTER — Other Ambulatory Visit (INDEPENDENT_AMBULATORY_CARE_PROVIDER_SITE_OTHER): Payer: 59

## 2023-07-06 ENCOUNTER — Encounter: Payer: Self-pay | Admitting: Internal Medicine

## 2023-07-06 DIAGNOSIS — Z125 Encounter for screening for malignant neoplasm of prostate: Secondary | ICD-10-CM

## 2023-07-06 DIAGNOSIS — R7303 Prediabetes: Secondary | ICD-10-CM

## 2023-07-06 DIAGNOSIS — E7800 Pure hypercholesterolemia, unspecified: Secondary | ICD-10-CM | POA: Diagnosis not present

## 2023-07-06 DIAGNOSIS — E559 Vitamin D deficiency, unspecified: Secondary | ICD-10-CM

## 2023-07-06 DIAGNOSIS — E538 Deficiency of other specified B group vitamins: Secondary | ICD-10-CM

## 2023-07-06 DIAGNOSIS — R5383 Other fatigue: Secondary | ICD-10-CM

## 2023-07-06 LAB — PSA: PSA: 2.25 ng/mL (ref 0.10–4.00)

## 2023-07-06 LAB — BASIC METABOLIC PANEL
BUN: 17 mg/dL (ref 6–23)
CO2: 23 meq/L (ref 19–32)
Calcium: 9.8 mg/dL (ref 8.4–10.5)
Chloride: 104 meq/L (ref 96–112)
Creatinine, Ser: 0.98 mg/dL (ref 0.40–1.50)
GFR: 81.71 mL/min (ref >60.00)
Glucose, Bld: 102 mg/dL — ABNORMAL HIGH (ref 70–99)
Potassium: 4.2 meq/L (ref 3.5–5.1)
Sodium: 136 meq/L (ref 135–145)

## 2023-07-06 LAB — URINALYSIS, ROUTINE W REFLEX MICROSCOPIC
Bilirubin Urine: NEGATIVE
Hgb urine dipstick: NEGATIVE
Ketones, ur: NEGATIVE
Leukocytes,Ua: NEGATIVE
Nitrite: NEGATIVE
RBC / HPF: NONE SEEN (ref 0–?)
Specific Gravity, Urine: 1.02 (ref 1.000–1.030)
Total Protein, Urine: NEGATIVE
Urine Glucose: NEGATIVE
Urobilinogen, UA: 0.2 (ref 0.0–1.0)
pH: 6 (ref 5.0–8.0)

## 2023-07-06 LAB — LIPID PANEL
Cholesterol: 147 mg/dL (ref 0–200)
HDL: 48.6 mg/dL (ref >39.00)
LDL Cholesterol: 72 mg/dL (ref 0–99)
NonHDL: 98.5
Total CHOL/HDL Ratio: 3
Triglycerides: 132 mg/dL (ref 0.0–149.0)
VLDL: 26.4 mg/dL (ref 0.0–40.0)

## 2023-07-06 LAB — MICROALBUMIN / CREATININE URINE RATIO
Creatinine,U: 150.9 mg/dL
Microalb Creat Ratio: 0.7 mg/g (ref 0.0–30.0)
Microalb, Ur: 1 mg/dL (ref 0.0–1.9)

## 2023-07-06 LAB — CBC WITH DIFFERENTIAL/PLATELET
Basophils Absolute: 0.1 10*3/uL (ref 0.0–0.1)
Basophils Relative: 1.1 % (ref 0.0–3.0)
Eosinophils Absolute: 0.2 10*3/uL (ref 0.0–0.7)
Eosinophils Relative: 3.2 % (ref 0.0–5.0)
HCT: 46.1 % (ref 39.0–52.0)
Hemoglobin: 16.2 g/dL (ref 13.0–17.0)
Lymphocytes Relative: 26.9 % (ref 12.0–46.0)
Lymphs Abs: 1.8 10*3/uL (ref 0.7–4.0)
MCHC: 35.2 g/dL (ref 30.0–36.0)
MCV: 91.7 fL (ref 78.0–100.0)
Monocytes Absolute: 0.4 10*3/uL (ref 0.1–1.0)
Monocytes Relative: 5.6 % (ref 3.0–12.0)
Neutro Abs: 4.1 10*3/uL (ref 1.4–7.7)
Neutrophils Relative %: 63.2 % (ref 43.0–77.0)
Platelets: 203 10*3/uL (ref 150.0–400.0)
RBC: 5.02 Mil/uL (ref 4.22–5.81)
RDW: 13.3 % (ref 11.5–15.5)
WBC: 6.5 10*3/uL (ref 4.0–10.5)

## 2023-07-06 LAB — VITAMIN B12: Vitamin B-12: 293 pg/mL (ref 211–911)

## 2023-07-06 LAB — HEPATIC FUNCTION PANEL
ALT: 30 U/L (ref 0–53)
AST: 23 U/L (ref 0–37)
Albumin: 4.6 g/dL (ref 3.5–5.2)
Alkaline Phosphatase: 38 U/L — ABNORMAL LOW (ref 39–117)
Bilirubin, Direct: 0.2 mg/dL (ref 0.0–0.3)
Total Bilirubin: 1 mg/dL (ref 0.2–1.2)
Total Protein: 7.2 g/dL (ref 6.0–8.3)

## 2023-07-06 LAB — TSH: TSH: 2.86 u[IU]/mL (ref 0.35–5.50)

## 2023-07-06 LAB — VITAMIN D 25 HYDROXY (VIT D DEFICIENCY, FRACTURES): VITD: 31.13 ng/mL (ref 30.00–100.00)

## 2023-07-06 LAB — HEMOGLOBIN A1C: Hgb A1c MFr Bld: 5.6 % (ref 4.6–6.5)

## 2023-07-06 NOTE — Progress Notes (Signed)
The test results show that your current treatment is OK, as the tests are stable.  Please continue the same plan.  There is no other need for change of treatment or further evaluation based on these results, at this time.  thanks 

## 2023-07-09 LAB — TESTOSTERONE, FREE, TOTAL, SHBG
Sex Hormone Binding: 56.8 nmol/L (ref 19.3–76.4)
Testosterone, Free: 3.9 pg/mL — ABNORMAL LOW (ref 6.6–18.1)
Testosterone: 427 ng/dL (ref 264–916)

## 2023-07-12 ENCOUNTER — Encounter: Payer: Self-pay | Admitting: Gastroenterology

## 2023-07-17 ENCOUNTER — Telehealth: Payer: Self-pay | Admitting: Gastroenterology

## 2023-07-17 ENCOUNTER — Other Ambulatory Visit: Payer: Self-pay | Admitting: *Deleted

## 2023-07-17 DIAGNOSIS — Z860100 Personal history of colon polyps, unspecified: Secondary | ICD-10-CM

## 2023-07-17 MED ORDER — NA SULFATE-K SULFATE-MG SULF 17.5-3.13-1.6 GM/177ML PO SOLN: 1.0000 | Freq: Once | ORAL | 0 refills | Status: AC

## 2023-07-17 NOTE — Telephone Encounter (Signed)
Patient called stating he has an upcoming procedure for 07/19/2023 and has not yet received Suprep medication and to be sent to the [facility]. Patient is requesting a call back.

## 2023-07-17 NOTE — Telephone Encounter (Signed)
LM informing pt his request for med has been sent the pharmacy requested. Left # for call back for any concerns or questions.

## 2023-07-19 ENCOUNTER — Ambulatory Visit: Payer: 59 | Admitting: Gastroenterology

## 2023-07-19 ENCOUNTER — Encounter: Payer: Self-pay | Admitting: Gastroenterology

## 2023-07-19 VITALS — BP 118/81 | HR 65 | Temp 98.2°F | Resp 20 | Ht 69.0 in | Wt 230.0 lb

## 2023-07-19 DIAGNOSIS — D123 Benign neoplasm of transverse colon: Secondary | ICD-10-CM | POA: Diagnosis not present

## 2023-07-19 DIAGNOSIS — Z1211 Encounter for screening for malignant neoplasm of colon: Secondary | ICD-10-CM | POA: Diagnosis present

## 2023-07-19 DIAGNOSIS — K635 Polyp of colon: Secondary | ICD-10-CM

## 2023-07-19 DIAGNOSIS — K621 Rectal polyp: Secondary | ICD-10-CM | POA: Diagnosis not present

## 2023-07-19 DIAGNOSIS — K648 Other hemorrhoids: Secondary | ICD-10-CM | POA: Diagnosis not present

## 2023-07-19 DIAGNOSIS — D120 Benign neoplasm of cecum: Secondary | ICD-10-CM

## 2023-07-19 DIAGNOSIS — Z860100 Personal history of colon polyps, unspecified: Secondary | ICD-10-CM

## 2023-07-19 DIAGNOSIS — K5730 Diverticulosis of large intestine without perforation or abscess without bleeding: Secondary | ICD-10-CM | POA: Diagnosis not present

## 2023-07-19 DIAGNOSIS — D128 Benign neoplasm of rectum: Secondary | ICD-10-CM

## 2023-07-19 MED ORDER — SODIUM CHLORIDE 0.9 % IV SOLN: 500.0000 mL | Freq: Once | INTRAVENOUS | Status: DC

## 2023-07-19 NOTE — Progress Notes (Signed)
 Called to room to assist during endoscopic procedure.  Patient ID and intended procedure confirmed with present staff. Received instructions for my participation in the procedure from the performing physician.

## 2023-07-19 NOTE — Op Note (Signed)
  [facility] Patient Name: Harry Carroll Procedure Date: 07/19/2023 8:48 AM MRN: [id number] Endoscopist: Elspeth P. Leigh , MD, [id number] Age: 63 Referring MD:  Date of Birth: [birth year] Gender: Male Account #: [id number] Procedure:                Colonoscopy Indications:              High risk colon cancer surveillance: Personal                            history of colonic polyps - 6 polyps removed                            11/2019, history of advanced polyps in the past Medicines:                Monitored Anesthesia Care Procedure:                Pre-Anesthesia Assessment:                           - Prior to the procedure, a History and Physical                            was performed, and patient medications and                            allergies were reviewed. The patient's tolerance of                            previous anesthesia was also reviewed. The risks                            and benefits of the procedure and the sedation                            options and risks were discussed with the patient.                            All questions were answered, and informed consent                            was obtained. Prior Anticoagulants: The patient has                            taken no anticoagulant or antiplatelet agents. ASA                            Grade Assessment: III - A patient with severe                            systemic disease. After reviewing the risks and                            benefits, the patient was deemed in satisfactory  condition to undergo the procedure.                           After obtaining informed consent, the colonoscope                            was passed under direct vision. Throughout the                            procedure, the patient's blood pressure, pulse, and                            oxygen saturations were monitored continuously. The                            CF [id number]  #[id number] was introduced through the anus                            and advanced to the the cecum, identified by                            appendiceal orifice and ileocecal valve. The                            colonoscopy was performed without difficulty. The                            patient tolerated the procedure well. The quality                            of the bowel preparation was adequate. The                            ileocecal valve, appendiceal orifice, and rectum                            were photographed. Scope In: 8:55:12 AM Scope Out: 9:17:22 AM Scope Withdrawal Time: 0 hours 20 minutes 10 seconds  Total Procedure Duration: 0 hours 22 minutes 10 seconds  Findings:                 The perianal and digital rectal examinations were                            normal.                           A 3 mm polyp was found in the cecum. The polyp was                            sessile. The polyp was removed with a cold snare.                            Resection and retrieval were complete.  Four sessile polyps were found in the transverse                            colon. The polyps were 2 to 3 mm in size. These                            polyps were removed with a cold snare. Resection                            and retrieval were complete.                           A 3 mm polyp was found in the rectum. The polyp was                            sessile. The polyp was removed with a cold snare.                            Resection and retrieval were complete.                           Multiple diverticula were found in the entire colon.                           Internal hemorrhoids were found during retroflexion.                           The exam was otherwise without abnormality. Complications:            No immediate complications. Estimated blood loss:                            Minimal. Estimated Blood Loss:     Estimated blood loss was  minimal. Impression:               - One 3 mm polyp in the cecum, removed with a cold                            snare. Resected and retrieved.                           - Four 2 to 3 mm polyps in the transverse colon,                            removed with a cold snare. Resected and retrieved.                           - One 3 mm polyp in the rectum, removed with a cold                            snare. Resected and retrieved.                           -  Diverticulosis in the entire examined colon.                           - Internal hemorrhoids.                           - The examination was otherwise normal. Recommendation:           - Patient has a contact number available for                            emergencies. The signs and symptoms of potential                            delayed complications were discussed with the                            patient. Return to normal activities tomorrow.                            Written discharge instructions were provided to the                            patient.                           - Resume previous diet.                           - Continue present medications.                           - Await pathology results. Elspeth P. Leigh, MD 07/19/2023 9:24:34 AM This report has been signed electronically.

## 2023-07-19 NOTE — Progress Notes (Signed)
 [facility] History and Physical   Primary Care Physician:  Norleen Lynwood ORN, MD   Reason for Procedure:   History of polyps  Plan:    colonoscopy     HPI: Harry Carroll is a 63 y.o. male  here for colonoscopy surveillance - history of polyps removed in the past, last exam 11/2019, 6 polyps, also with history of advanced adenoma.  Patient denies any bowel symptoms at this time. No family history of colon cancer known. Otherwise feels well without any cardiopulmonary symptoms.   I have discussed risks / benefits of anesthesia and endoscopic procedure with Harry Carroll and they wish to proceed with the exams as outlined today.    Past Medical History:  Diagnosis Date   Arthritis    Bipolar 1 disorder (HCC)    Dr Geoffry , pt denies   Carotid stenosis    Carpal tunnel syndrome    saw Dr Enis , pt unaware   Cervical spinal stenosis    Colon polyps    adenomatous   Complication of anesthesia    trouble placing catheter with last surgeery, had to get urologist   Diverticulitis    Diverticulosis    Heart murmur    pt denies   History of hiatal hernia    resolved   Hyperlipemia    Insomnia    related to pain   Normal cardiac stress test    CP 01-2009---Nl ECHO and stress test neg   Numbness of fingers of both hands    when sleeps, right hand worse than left   PVC (premature ventricular contraction)    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   Restless leg    Sleep apnea    does not use cpap due to does not like    Past Surgical History:  Procedure Laterality Date   COLONOSCOPY     COLONOSCOPY WITH PROPOFOL  N/A 04/02/2014   Procedure: COLONOSCOPY WITH PROPOFOL ;  Surgeon: Lamar JONETTA Aho, MD;  Location: [facility];  Service: Endoscopy;  Laterality: N/A;   CYSTOSCOPY WITH URETHRAL DILATATION  12/20/2011   Procedure: CYSTOSCOPY WITH URETHRAL DILATATION;  Surgeon: Krystal CHRISTELLA Spinner, MD;  Location: [facility];  Service: General;;  with foley insertion   CYSTOSCOPY  WITH URETHRAL DILATATION  12/20/2011   Procedure: CYSTOSCOPY WITH URETHRAL DILATATION;  Surgeon: Garnette Shack, MD;  Location: [facility];  Service: Urology;;   CYSTOSCOPY WITH URETHRAL DILATATION N/A 12/15/2017   Procedure: CYSTOSCOPY WITH URETHRAL DILATATION;  Surgeon: Watt Norleen, MD;  Location: [facility];  Service: Urology;  Laterality: N/A;   HOT HEMOSTASIS N/A 04/02/2014   Procedure: HOT HEMOSTASIS (ARGON PLASMA COAGULATION/BICAP);  Surgeon: Lamar JONETTA Aho, MD;  Location: [facility];  Service: Endoscopy;  Laterality: N/A;   maxillofacial surgery     PARTIAL COLECTOMY  12/20/2011   Procedure: PARTIAL COLECTOMY;  Surgeon: Krystal CHRISTELLA Spinner, MD;  Location: [facility];  Service: General;  Laterality: N/A;  Sigmoid colectomy   right knee arthroscopy     2007 x2   right shoulder surgery     X2   SHOULDER SURGERY     LEFT   TOTAL KNEE ARTHROPLASTY Left 12/15/2017   Procedure: LEFT TOTAL KNEE ARTHROPLASTY;  Surgeon: Gerome Lamar, MD;  Location: [facility];  Service: Orthopedics;  Laterality: Left;  Adductor Block   TOTAL KNEE ARTHROPLASTY Right 05/18/2018   Procedure: TOTAL KNEE ARTHROPLASTY;  Surgeon: Gerome Lamar, MD;  Location: [facility];  Service: Orthopedics;  Laterality: Right;  with  block    Prior to Admission medications   Medication Sig Start Date End Date Taking? Authorizing Provider  albuterol  (VENTOLIN  HFA) 108 (90 Base) MCG/ACT inhaler Inhale 2 puffs into the lungs every 4 (four) hours as needed for wheezing or shortness of breath. 10/13/22   Vonna Sharlet POUR, MD  aspirin  EC 81 MG tablet Take 1 tablet (81 mg total) by mouth daily. Swallow whole. Patient not taking: Reported on 06/21/2023 03/28/22   Norleen Lynwood ORN, MD  oxycodone  (OXY-IR) 5 MG capsule Take 1 capsule (5 mg total) by mouth every 4 (four) hours as needed. Patient not taking: Reported on 03/29/2023 11/28/22   Norleen Lynwood ORN, MD  REPATHA  SURECLICK 140 MG/ML SOAJ ADMINISTER 1 ML UNDER THE SKIN EVERY 14 DAYS 04/07/23   Pietro Redell RAMAN, MD  tirzepatide  (MOUNJARO ) 7.5 MG/0.5ML Pen Inject 7.5 mg into the skin once a week. 03/29/23   Norleen Lynwood ORN, MD    Current Outpatient Medications  Medication Sig Dispense Refill   albuterol  (VENTOLIN  HFA) 108 (90 Base) MCG/ACT inhaler Inhale 2 puffs into the lungs every 4 (four) hours as needed for wheezing or shortness of breath. 1 each 0   aspirin  EC 81 MG tablet Take 1 tablet (81 mg total) by mouth daily. Swallow whole. (Patient not taking: Reported on 06/21/2023) 30 tablet 12   oxycodone  (OXY-IR) 5 MG capsule Take 1 capsule (5 mg total) by mouth every 4 (four) hours as needed. (Patient not taking: Reported on 03/29/2023) 30 capsule 0   REPATHA  SURECLICK 140 MG/ML SOAJ ADMINISTER 1 ML UNDER THE SKIN EVERY 14 DAYS 2 mL 11   tirzepatide  (MOUNJARO ) 7.5 MG/0.5ML Pen Inject 7.5 mg into the skin once a week. 6 mL 3   Current Facility-Administered Medications  Medication Dose Route Frequency Provider Last Rate Last Admin   0.9 %  sodium chloride  infusion  500 mL Intravenous Once Eliyas Suddreth, Elspeth SQUIBB, MD        Allergies as of 07/19/2023 - Review Complete 07/19/2023  Allergen Reaction Noted   Ciprofloxacin  Shortness Of Breath, Nausea And Vomiting, Nausea Only, Rash, and Other (See Comments) 11/23/2011   Dilaudid  [hydromorphone  hcl] Shortness Of Breath, Nausea Only, and Other (See Comments) 11/23/2011   Hydrocodone  Itching     Family History  Problem Relation Age of Onset   Liver disease Mother    Alcohol abuse Father    Alcohol abuse Sister    Cancer Paternal Aunt        lymph cancer   Prostate cancer Paternal Grandfather    Colon cancer Neg Hx    CAD Neg Hx    Pancreatic cancer Neg Hx    Rectal cancer Neg Hx    Stomach cancer Neg Hx    Drug abuse Neg Hx    Anxiety disorder Neg Hx    Bipolar disorder Neg Hx    Depression Neg Hx    Esophageal cancer Neg Hx     Social History   Socioeconomic History   Marital status: Married    Spouse name: Not on file   Number of  children: 4   Years of education: 14   Highest education level: Not on file  Occupational History   Occupation: Software    Employer: [employer]  Tobacco Use   Smoking status: Former    Types: Cigars    Quit date: 06/13/2018    Years since quitting: 5.1   Smokeless tobacco: Former    Types: Snuff    Quit  date: 12/10/2017   Tobacco comments:    1 per week  Vaping Use   Vaping status: Never Used  Substance and Sexual Activity   Alcohol use: Yes    Comment:  a couple beers a few times a week    Drug use: No    Comment: denies THC, cocainse, stimulants, pain meds, benzos, synthetic drugs   Sexual activity: Not Currently  Other Topics Concern   Not on file  Social History Narrative   Born in [place]. Currently resides in a house with his wife. 1 dog, 1 snake, 1 cat. 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    Denies religious beliefs that would effect health care.    Social Drivers of Corporate Investment Banker Strain: Not on file  Food Insecurity: Not on file  Transportation Needs: Not on file  Physical Activity: Not on file  Stress: Not on file  Social Connections: Not on file  Intimate Partner Violence: Not on file    Review of Systems: All other review of systems negative except as mentioned in the HPI.  Physical Exam: Vital signs BP 123/79   Temp 98.2 F (36.8 C)   Ht 5' 9 (1.753 m)   Wt 230 lb (104.3 kg)   SpO2 96%   BMI 33.97 kg/m   General:   Alert,  Well-developed, pleasant and cooperative in NAD Lungs:  Clear throughout to auscultation.   Heart:  Regular rate and rhythm Abdomen:  Soft, nontender and nondistended.   Neuro/Psych:  Alert and cooperative. Normal mood and affect. A and O x 3  Marcey Naval, MD [facility]

## 2023-07-19 NOTE — Progress Notes (Signed)
 Pt sedate, gd SR's, VSS, report to RN

## 2023-07-19 NOTE — Patient Instructions (Signed)
 Please read handouts provided. Continue present medications. Await pathology results.   YOU HAD AN ENDOSCOPIC PROCEDURE TODAY AT THE [facility]:   Refer to the procedure report that was given to you for any specific questions about what was found during the examination.  If the procedure report does not answer your questions, please call your gastroenterologist to clarify.  If you requested that your care partner not be given the details of your procedure findings, then the procedure report has been included in a sealed envelope for you to review at your convenience later.  YOU SHOULD EXPECT: Some feelings of bloating in the abdomen. Passage of more gas than usual.  Walking can help get rid of the air that was put into your GI tract during the procedure and reduce the bloating. If you had a lower endoscopy (such as a colonoscopy or flexible sigmoidoscopy) you may notice spotting of blood in your stool or on the toilet paper. If you underwent a bowel prep for your procedure, you may not have a normal bowel movement for a few days.  Please Note:  You might notice some irritation and congestion in your nose or some drainage.  This is from the oxygen used during your procedure.  There is no need for concern and it should clear up in a day or so.  SYMPTOMS TO REPORT IMMEDIATELY:  Following lower endoscopy (colonoscopy or flexible sigmoidoscopy):  Excessive amounts of blood in the stool  Significant tenderness or worsening of abdominal pains  Swelling of the abdomen that is new, acute  Fever of 100F or higher  For urgent or emergent issues, a gastroenterologist can be reached at any hour by calling [phone]. Do not use MyChart messaging for urgent concerns.    DIET:  We do recommend a small meal at first, but then you may proceed to your regular diet.  Drink plenty of fluids but you should avoid alcoholic beverages for 24 hours.  ACTIVITY:  You should plan to take it easy for  the rest of today and you should NOT DRIVE or use heavy machinery until tomorrow (because of the sedation medicines used during the test).    FOLLOW UP: Our staff will call the number listed on your records the next business day following your procedure.  We will call around 7:15- 8:00 am to check on you and address any questions or concerns that you may have regarding the information given to you following your procedure. If we do not reach you, we will leave a message.     If any biopsies were taken you will be contacted by phone or by letter within the next 1-3 weeks.  Please call us at [phone] if you have not heard about the biopsies in 3 weeks.    SIGNATURES/CONFIDENTIALITY: You and/or your care partner have signed paperwork which will be entered into your electronic medical record.  These signatures attest to the fact that that the information above on your After Visit Summary has been reviewed and is understood.  Full responsibility of the confidentiality of this discharge information lies with you and/or your care-partner.

## 2023-07-19 NOTE — Progress Notes (Signed)
 Pt's states no medical or surgical changes since previsit or office visit.

## 2023-07-20 ENCOUNTER — Telehealth: Payer: Self-pay

## 2023-07-20 NOTE — Telephone Encounter (Signed)
 Follow up call to pt, lm for pt to call if having any difficulty with normal activities or eating and drinking.  Also to call if any other questions or concerns.

## 2023-07-21 LAB — SURGICAL PATHOLOGY

## 2023-07-23 ENCOUNTER — Encounter: Payer: Self-pay | Admitting: Gastroenterology

## 2023-08-30 ENCOUNTER — Encounter: Payer: Self-pay | Admitting: Internal Medicine

## 2023-08-31 MED ORDER — TESTOSTERONE CYPIONATE 200 MG/ML IM SOLN: 100.0000 mg | INTRAMUSCULAR | 5 refills | Status: DC

## 2023-09-18 DIAGNOSIS — M654 Radial styloid tenosynovitis [de Quervain]: Secondary | ICD-10-CM | POA: Insufficient documentation

## 2023-09-22 ENCOUNTER — Ambulatory Visit
Admission: RE | Admit: 2023-09-22 | Discharge: 2023-09-22 | Disposition: A | Discharge status: Home / Self Care | Source: Ambulatory Visit | Attending: Family Medicine | Admitting: Family Medicine

## 2023-09-22 VITALS — BP 130/74 | HR 89 | Temp 98.3°F | Resp 18 | Ht 69.0 in | Wt 229.9 lb

## 2023-09-22 DIAGNOSIS — H5789 Other specified disorders of eye and adnexa: Secondary | ICD-10-CM

## 2023-09-22 MED ORDER — TOBRAMYCIN 0.3 % OP SOLN: 2.0000 [drp] | Freq: Four times a day (QID) | OPHTHALMIC | 0 refills | Status: DC

## 2023-09-22 NOTE — ED Triage Notes (Signed)
 Possible scratch or infection (Left eye)- Entered by patient  "Yesterday when out working in my building I may have gotten something in my left eye".

## 2023-09-22 NOTE — ED Provider Notes (Signed)
 [facility]    CSN: [id number] Arrival date & time: 09/22/23  1011      History   Chief Complaint Chief Complaint  Patient presents with   Eye Problem    HPI Harry Carroll is a 63 y.o. male.    Eye Problem Associated symptoms: itching and redness    He is here for left eye irritation.  He was working outside yesterday, and noted some discomfort/irritation to the left eye.  He does wear contacts.  He took out the contacts and irritated the eye, but the irritation continued.  He woke up this am with soreness around the eye, and worsening discomfort/redness.       Past Medical History:  Diagnosis Date   Arthritis    Bipolar 1 disorder (HCC)    Dr Jennelle Human , pt denies   Carotid stenosis    Carpal tunnel syndrome    saw Dr Onnie Graham , pt unaware   Cervical spinal stenosis    Colon polyps    adenomatous   Complication of anesthesia    trouble placing catheter with last surgeery, had to get urologist   Diverticulitis    Diverticulosis    Heart murmur    pt denies   History of hiatal hernia    resolved   Hyperlipemia    Insomnia    related to pain   Normal cardiac stress test    CP 01-2009---Nl ECHO and stress test neg   Numbness of fingers of both hands    when sleeps, right hand worse than left   PVC (premature ventricular contraction)    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   Restless leg    Sleep apnea    does not use cpap due to does not like    Patient Active Problem List   Diagnosis Date Noted   Radial styloid tenosynovitis (de quervain) 09/18/2023   Left cervical radiculopathy 11/30/2022   Vitamin D deficiency 11/28/2022   Bilateral carotid artery stenosis 03/24/2022   Pain in finger of right hand 05/21/2021   History of COVID-19 03/04/2020   Physical deconditioning 03/04/2020   Posterior rhinorrhea 09/29/2019   DOE (dyspnea on exertion) 09/26/2019   Abnormal liver function tests 06/12/2019   Alcohol use, unspecified with  unspecified alcohol-induced disorder (HCC) 03/19/2019   Acute respiratory failure with hypoxia (HCC) 12/21/2018   Depression 12/21/2018   COVID-19 virus infection 12/20/2018   De Quervain's tenosynovitis, right 07/31/2018   S/P knee replacement 05/18/2018   Encounter for other specified aftercare 04/23/2018   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/15/2017   Prediabetes 06/22/2017   Low testosterone 10/03/2016   Morbid obesity due to excess calories (HCC) 03/28/2016   Acute diverticulitis 10/21/2015   Encounter for well adult exam with abnormal findings 03/24/2015   Contusion of rib 11/13/2014   History of colonic polyps 04/02/2014   Diverticulitis of colon (without mention of hemorrhage)(562.11) 01/17/2014   Diverticulitis-post resection July 2013 11/27/2011   Obstructive sleep apnea syndrome 11/17/2011   History of colonic polyps 02/18/2011   HYPERTRIGLYCERIDEMIA 02/20/2009   Bipolar I disorder (HCC) 11/29/2007   Hyperlipidemia 04/16/2007    Past Surgical History:  Procedure Laterality Date   COLONOSCOPY     COLONOSCOPY WITH PROPOFOL N/A 04/02/2014   Procedure: COLONOSCOPY WITH PROPOFOL;  Surgeon: Louis Meckel, MD;  Location: [facility];  Service: Endoscopy;  Laterality: N/A;   CYSTOSCOPY WITH URETHRAL DILATATION  12/20/2011   Procedure: CYSTOSCOPY WITH URETHRAL DILATATION;  Surgeon: Tawanna Cooler  Molly Maduro, MD;  Location: [facility];  Service: General;;  with foley insertion   CYSTOSCOPY WITH URETHRAL DILATATION  12/20/2011   Procedure: CYSTOSCOPY WITH URETHRAL DILATATION;  Surgeon: Marcine Matar, MD;  Location: [facility];  Service: Urology;;   CYSTOSCOPY WITH URETHRAL DILATATION N/A 12/15/2017   Procedure: CYSTOSCOPY WITH URETHRAL DILATATION;  Surgeon: Bjorn Pippin, MD;  Location: [facility];  Service: Urology;  Laterality: N/A;   HOT HEMOSTASIS N/A 04/02/2014   Procedure: HOT HEMOSTASIS (ARGON PLASMA COAGULATION/BICAP);  Surgeon: Louis Meckel, MD;  Location: [facility];  Service:  Endoscopy;  Laterality: N/A;   maxillofacial surgery     PARTIAL COLECTOMY  12/20/2011   Procedure: PARTIAL COLECTOMY;  Surgeon: Velora Heckler, MD;  Location: [facility];  Service: General;  Laterality: N/A;  Sigmoid colectomy   right knee arthroscopy     2007 x2   right shoulder surgery     X2   SHOULDER SURGERY     LEFT   TOTAL KNEE ARTHROPLASTY Left 12/15/2017   Procedure: LEFT TOTAL KNEE ARTHROPLASTY;  Surgeon: Eugenia Mcalpine, MD;  Location: [facility];  Service: Orthopedics;  Laterality: Left;  Adductor Block   TOTAL KNEE ARTHROPLASTY Right 05/18/2018   Procedure: TOTAL KNEE ARTHROPLASTY;  Surgeon: Eugenia Mcalpine, MD;  Location: [facility];  Service: Orthopedics;  Laterality: Right;  with block       Home Medications    Prior to Admission medications   Medication Sig Start Date End Date Taking? Authorizing Provider  aspirin EC 81 MG tablet Take 1 tablet (81 mg total) by mouth daily. Swallow whole. 03/28/22  Yes Corwin Levins, MD  diphenhydrAMINE (BENADRYL) 25 MG tablet Take 25-50 mg by mouth every 4 (four) hours as needed. 07/17/23  Yes [provider]  albuterol (VENTOLIN HFA) 108 (90 Base) MCG/ACT inhaler Inhale 2 puffs into the lungs every 4 (four) hours as needed for wheezing or shortness of breath. 10/13/22   Zenia Resides, MD  amoxicillin (AMOXIL) 500 MG tablet TAKE 4 TABLETS BY MOUTH 1 HOUR BEFORE APPOINTMENT    [provider]  amoxicillin-clavulanate (AUGMENTIN) 875-125 MG tablet Take one tablet by mouth two times daily as directed    [provider]  azithromycin (ZITHROMAX) 250 MG tablet TAKE 2 TABLETS ON DAY 1, THEN 1 TABLET DAILY ON DAYS 2 THROUGH 5    [provider]  benzonatate (TESSALON) 100 MG capsule Take 1 capsule by mouth 3 (three) times daily as needed.    [provider]  ciprofloxacin (CIPRO) 500 MG tablet Take 500 mg by mouth as directed.    [provider]  cyclobenzaprine (FLEXERIL) 10 MG tablet Take 1 tablet  by mouth at bedtime.    [provider]  doxycycline (VIBRAMYCIN) 100 MG capsule Take 100 mg by mouth 2 (two) times daily.    [provider]  IOSAT 130 MG tablet Take 130 mg by mouth as directed.    [provider]  metroNIDAZOLE (FLAGYL) 500 MG tablet Take 500 mg by mouth as directed.    [provider]  Na Sulfate-K Sulfate-Mg Sulfate concentrate (SUPREP) 17.5-3.13-1.6 GM/177ML SOLN Take 1 kit by mouth See admin instructions.    [provider]  oxycodone (OXY-IR) 5 MG capsule Take 1 capsule (5 mg total) by mouth every 4 (four) hours as needed. Patient not taking: Reported on 03/29/2023 11/28/22   Corwin Levins, MD  predniSONE (DELTASONE) 20 MG tablet Take 2 tablets by mouth daily with breakfast.  [provider]  REPATHA SURECLICK 140 MG/ML SOAJ ADMINISTER 1 ML UNDER THE SKIN EVERY 14 DAYS 04/07/23   Lewayne Bunting, MD  testosterone cypionate (DEPOTESTOSTERONE CYPIONATE) 200 MG/ML injection Inject 0.5 mLs (100 mg total) into the muscle every 14 (fourteen) days. 08/31/23   Corwin Levins, MD  tirzepatide [facility]) 7.5 MG/0.5ML Pen Inject 7.5 mg into the skin once a week. 03/29/23   Corwin Levins, MD    Family History Family History  Problem Relation Age of Onset   Liver disease Mother    Alcohol abuse Father    Alcohol abuse Sister    Prostate cancer Paternal Grandfather    Cancer Paternal Aunt        lymph cancer   Colon cancer Neg Hx    CAD Neg Hx    Pancreatic cancer Neg Hx    Rectal cancer Neg Hx    Stomach cancer Neg Hx    Drug abuse Neg Hx    Anxiety disorder Neg Hx    Bipolar disorder Neg Hx    Depression Neg Hx    Esophageal cancer Neg Hx     Social History Social History   Tobacco Use   Smoking status: Former    Types: Cigars    Quit date: 06/13/2018    Years since quitting: 5.2   Smokeless tobacco: Former    Types: Snuff    Quit date: 12/10/2017   Tobacco comments:    1 per week  Vaping Use   Vaping  status: Never Used  Substance Use Topics   Alcohol use: Yes    Comment:  a couple beers a few times a week    Drug use: No    Comment: denies THC, cocainse, stimulants, pain meds, benzos, synthetic drugs     Allergies   Ciprofloxacin, Dilaudid [hydromorphone hcl], Hydromorphone, and Hydrocodone   Review of Systems Review of Systems  Constitutional: Negative.   HENT: Negative.    Eyes:  Positive for pain, redness and itching.  Cardiovascular: Negative.   Gastrointestinal: Negative.   Musculoskeletal: Negative.   Psychiatric/Behavioral: Negative.       Physical Exam Triage Vital Signs ED Triage Vitals  Encounter Vitals Group     BP 09/22/23 1042 130/74     Systolic BP Percentile --      Diastolic BP Percentile --      Pulse Rate 09/22/23 1042 89     Resp 09/22/23 1042 18     Temp 09/22/23 1042 98.3 F (36.8 C)     Temp Source 09/22/23 1042 Oral     SpO2 09/22/23 1042 96 %     Weight 09/22/23 1040 229 lb 15 oz (104.3 kg)     Height 09/22/23 1040 5\' 9"  (1.753 m)     Head Circumference --      Peak Flow --      Pain Score 09/22/23 1038 0     Pain Loc --      Pain Education --      Exclude from Growth Chart --    No data found.  Updated Vital Signs BP 130/74 (BP Location: Left Arm)   Pulse 89   Temp 98.3 F (36.8 C) (Oral)   Resp 18   Ht 5\' 9"  (1.753 m)   Wt 104.3 kg   SpO2 96%   BMI 33.96 kg/m   Visual Acuity Right Eye Distance: 20/20 (Corrected) Left Eye Distance: 20/25 (Corrected) Bilateral Distance: 20/20 (Corrected, Color (Pass))  Right  Eye Near:   Left Eye Near:    Bilateral Near:     Physical Exam Constitutional:      Appearance: Normal appearance. He is normal weight.  Eyes:     General: Lids are normal.     Extraocular Movements: Extraocular movements intact.     Conjunctiva/sclera:     Left eye: Left conjunctiva is injected. Hemorrhage present.     Comments: Flourescein stain did not show any FB, ulceration, or scratch to the cornea or  conjunctiva  Neurological:     Mental Status: He is alert.      UC Treatments / Results  Labs (all labs ordered are listed, but only abnormal results are displayed) Labs Reviewed - No data to display  EKG   Radiology No results found.  Procedures Procedures (including critical care time)  Medications Ordered in UC Medications - No data to display  Initial Impression / Assessment and Plan / UC Course  I have reviewed the triage vital signs and the nursing notes.  Pertinent labs & imaging results that were available during my care of the patient were reviewed by me and considered in my medical decision making (see chart for details).    Final Clinical Impressions(s) / UC Diagnoses   Final diagnoses:  Irritation of left eye     Discharge Instructions      You were seen today for left eye irritation.  I did not see a foreign body, ulcer or scratch to the eye.  I have sent out an antibiotic eye drop to cover for possible secondary infection.  I recommend you get over the counter anti-histamine eye drops, like pataday or zaditor for the itch and irritation as well.  Please follow up here, or with your eye specialist if you continue with symptoms.     ED Prescriptions     Medication Sig Dispense Auth. Provider   tobramycin (TOBREX) 0.3 % ophthalmic solution Place 2 drops into the left eye every 6 (six) hours. 5 mL Jannifer Franklin, MD      PDMP not reviewed this encounter.   Jannifer Franklin, MD 09/22/23 [phone]

## 2023-09-22 NOTE — Discharge Instructions (Signed)
 You were seen today for left eye irritation.  I did not see a foreign body, ulcer or scratch to the eye.  I have sent out an antibiotic eye drop to cover for possible secondary infection.  I recommend you get over the counter anti-histamine eye drops, like pataday or zaditor for the itch and irritation as well.  Please follow up here, or with your eye specialist if you continue with symptoms.

## 2023-10-15 ENCOUNTER — Encounter: Payer: Self-pay | Admitting: Internal Medicine

## 2023-11-20 ENCOUNTER — Encounter: Payer: Self-pay | Admitting: Internal Medicine

## 2023-11-21 ENCOUNTER — Encounter: Payer: Self-pay | Admitting: Emergency Medicine

## 2023-11-21 ENCOUNTER — Ambulatory Visit
Admission: EM | Admit: 2023-11-21 | Discharge: 2023-11-21 | Disposition: A | Discharge status: Home / Self Care | Attending: Physician Assistant | Admitting: Physician Assistant

## 2023-11-21 DIAGNOSIS — L089 Local infection of the skin and subcutaneous tissue, unspecified: Secondary | ICD-10-CM

## 2023-11-21 MED ORDER — DOXYCYCLINE HYCLATE 100 MG PO CAPS: 100.0000 mg | ORAL_CAPSULE | Freq: Two times a day (BID) | ORAL | 0 refills | Status: DC

## 2023-11-21 NOTE — ED Triage Notes (Signed)
 Pt presents c/o what he thought was a bug bite on his right arm. Pt states it has not grown in size since 10/31/23. The area is painful and the pain is starting to radiate throughout his arm.

## 2023-11-21 NOTE — Discharge Instructions (Addendum)
 Return if any problems.

## 2023-11-21 NOTE — ED Provider Notes (Signed)
 [facility]    CSN: [id number] Arrival date & time: 11/21/23  1334      History   Chief Complaint Chief Complaint  Patient presents with   Wound Check    HPI Harry Carroll is a 63 y.o. male.   Patient complains of a swollen red sore on his right arm that he has had for over 3 weeks.  Patient states he has been putting a prescription topical antibiotic on the area but it is not healing.  He is scheduled to see the dermatologist on Thursday but he is concerned that he could have MRSA.  Patient denies any fever or chills he has not had any areas that have spread.  Patient denies any tick bites.  He thinks he may have been bitten by an insect there several weeks ago.  Patient denies any diabetes.  He has not had any previous skin lesions that were tested for skin cancer.   Wound Check    Past Medical History:  Diagnosis Date   Arthritis    Bipolar 1 disorder (HCC)    Dr Toi Foster , pt denies   Carotid stenosis    Carpal tunnel syndrome    saw Dr Pleasant Brilliant , pt unaware   Cervical spinal stenosis    Colon polyps    adenomatous   Complication of anesthesia    trouble placing catheter with last surgeery, had to get urologist   Diverticulitis    Diverticulosis    Heart murmur    pt denies   History of hiatal hernia    resolved   Hyperlipemia    Insomnia    related to pain   Normal cardiac stress test    CP 01-2009---Nl ECHO and stress test neg   Numbness of fingers of both hands    when sleeps, right hand worse than left   PVC (premature ventricular contraction)    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   Restless leg    Sleep apnea    does not use cpap due to does not like    Patient Active Problem List   Diagnosis Date Noted   Radial styloid tenosynovitis (de quervain) 09/18/2023   Left cervical radiculopathy 11/30/2022   Vitamin D  deficiency 11/28/2022   Bilateral carotid artery stenosis 03/24/2022   Pain in finger of right hand 05/21/2021   History  of COVID-19 03/04/2020   Physical deconditioning 03/04/2020   Posterior rhinorrhea 09/29/2019   DOE (dyspnea on exertion) 09/26/2019   Abnormal liver function tests 06/12/2019   Alcohol use, 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   Acute respiratory failure with hypoxia (HCC) 12/21/2018   Depression 12/21/2018   COVID-19 virus infection 12/20/2018   De Quervain's tenosynovitis, right 07/31/2018   S/P knee replacement 05/18/2018   Encounter for other specified aftercare 04/23/2018   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/15/2017   Prediabetes 06/22/2017   Low testosterone  10/03/2016   Morbid obesity due to excess calories (HCC) 03/28/2016   Acute diverticulitis 10/21/2015   Encounter for well adult exam with abnormal findings 03/24/2015   Contusion of rib 11/13/2014   History of colonic polyps 04/02/2014   Diverticulitis of colon (without mention of hemorrhage)(562.11) 01/17/2014   Diverticulitis-post resection July 2013 11/27/2011   Obstructive sleep apnea syndrome 11/17/2011   History of colonic polyps 02/18/2011   HYPERTRIGLYCERIDEMIA 02/20/2009   Bipolar I disorder (HCC) 11/29/2007   Hyperlipidemia 04/16/2007    Past Surgical History:  Procedure Laterality Date  COLONOSCOPY     COLONOSCOPY WITH PROPOFOL  N/A 04/02/2014   Procedure: COLONOSCOPY WITH PROPOFOL ;  Surgeon: Claudette Cue, MD;  Location: [facility];  Service: Endoscopy;  Laterality: N/A;   CYSTOSCOPY WITH URETHRAL DILATATION  12/20/2011   Procedure: CYSTOSCOPY WITH URETHRAL DILATATION;  Surgeon: Keitha Pata, MD;  Location: [facility];  Service: General;;  with foley insertion   CYSTOSCOPY WITH URETHRAL DILATATION  12/20/2011   Procedure: CYSTOSCOPY WITH URETHRAL DILATATION;  Surgeon: Trent Frizzle, MD;  Location: [facility];  Service: Urology;;   CYSTOSCOPY WITH URETHRAL DILATATION N/A 12/15/2017   Procedure: CYSTOSCOPY WITH URETHRAL DILATATION;  Surgeon: Homero Luster, MD;  Location:  [facility];  Service: Urology;  Laterality: N/A;   HOT HEMOSTASIS N/A 04/02/2014   Procedure: HOT HEMOSTASIS (ARGON PLASMA COAGULATION/BICAP);  Surgeon: Claudette Cue, MD;  Location: [facility];  Service: Endoscopy;  Laterality: N/A;   maxillofacial surgery     PARTIAL COLECTOMY  12/20/2011   Procedure: PARTIAL COLECTOMY;  Surgeon: Keitha Pata, MD;  Location: [facility];  Service: General;  Laterality: N/A;  Sigmoid colectomy   right knee arthroscopy     2007 x2   right shoulder surgery     X2   SHOULDER SURGERY     LEFT   TOTAL KNEE ARTHROPLASTY Left 12/15/2017   Procedure: LEFT TOTAL KNEE ARTHROPLASTY;  Surgeon: Genevie Kerns, MD;  Location: [facility];  Service: Orthopedics;  Laterality: Left;  Adductor Block   TOTAL KNEE ARTHROPLASTY Right 05/18/2018   Procedure: TOTAL KNEE ARTHROPLASTY;  Surgeon: Genevie Kerns, MD;  Location: [facility];  Service: Orthopedics;  Laterality: Right;  with block       Home Medications    Prior to Admission medications   Medication Sig Start Date End Date Taking? Authorizing Provider  albuterol  (VENTOLIN  HFA) 108 (90 Base) MCG/ACT inhaler Inhale 2 puffs into the lungs every 4 (four) hours as needed for wheezing or shortness of breath. 10/13/22   Ann Keto, MD  aspirin  EC 81 MG tablet Take 1 tablet (81 mg total) by mouth daily. Swallow whole. 03/28/22   Roslyn Coombe, MD  benzonatate  (TESSALON ) 100 MG capsule Take 1 capsule by mouth 3 (three) times daily as needed.    [provider]  cyclobenzaprine  (FLEXERIL ) 10 MG tablet Take 1 tablet by mouth at bedtime.    [provider]  diphenhydrAMINE  (BENADRYL ) 25 MG tablet Take 25-50 mg by mouth every 4 (four) hours as needed. 07/17/23   [provider]  doxycycline  (VIBRAMYCIN ) 100 MG capsule Take 1 capsule (100 mg total) by mouth 2 (two) times daily. 11/21/23   Advit Trethewey K, PA-C  IOSAT 130 MG tablet Take 130 mg by mouth as directed.    [provider]  Na Sulfate-K  Sulfate-Mg Sulfate concentrate (SUPREP) 17.5-3.13-1.6 GM/177ML SOLN Take 1 kit by mouth See admin instructions.    [provider]  oxycodone  (OXY-IR) 5 MG capsule Take 1 capsule (5 mg total) by mouth every 4 (four) hours as needed. Patient not taking: Reported on 03/29/2023 11/28/22   Roslyn Coombe, MD  predniSONE  (DELTASONE ) 20 MG tablet Take 2 tablets by mouth daily with breakfast.    [provider]  REPATHA  SURECLICK 140 MG/ML SOAJ ADMINISTER 1 ML UNDER THE SKIN EVERY 14 DAYS 04/07/23   Lenise Quince, MD  testosterone  cypionate (DEPOTESTOSTERONE CYPIONATE) 200 MG/ML injection Inject 0.5 mLs (100 mg total) into the muscle every 14 (fourteen) days. 08/31/23   Roslyn Coombe,  MD  tirzepatide  (MOUNJARO ) 7.5 MG/0.5ML Pen Inject 7.5 mg into the skin once a week. 03/29/23   Roslyn Coombe, MD  tobramycin  (TOBREX ) 0.3 % ophthalmic solution Place 2 drops into the left eye every 6 (six) hours. 09/22/23   Lesle Ras, MD    Family History Family History  Problem Relation Age of Onset   Liver disease Mother    Alcohol abuse Father    Alcohol abuse Sister    Prostate cancer Paternal Grandfather    Cancer Paternal Aunt        lymph cancer   Colon cancer Neg Hx    CAD Neg Hx    Pancreatic cancer Neg Hx    Rectal cancer Neg Hx    Stomach cancer Neg Hx    Drug abuse Neg Hx    Anxiety disorder Neg Hx    Bipolar disorder Neg Hx    Depression Neg Hx    Esophageal cancer Neg Hx     Social History Social History   Tobacco Use   Smoking status: Former    Types: Cigars    Quit date: 06/13/2018    Years since quitting: 5.4    Passive exposure: Current   Smokeless tobacco: Former    Types: Snuff    Quit date: 12/10/2017   Tobacco comments:    1 per week  Vaping Use   Vaping status: Never Used  Substance Use Topics   Alcohol use: Yes    Comment:  a couple beers a few times a week    Drug use: No    Comment: denies THC, cocainse, stimulants, pain meds, benzos, synthetic  drugs     Allergies   Ciprofloxacin , Dilaudid  [hydromorphone  hcl], Hydromorphone , and Hydrocodone    Review of Systems Review of Systems  All other systems reviewed and are negative.    Physical Exam Triage Vital Signs ED Triage Vitals  Encounter Vitals Group     BP 11/21/23 1439 (!) 149/66     Systolic BP Percentile --      Diastolic BP Percentile --      Pulse Rate 11/21/23 1439 85     Resp 11/21/23 1439 18     Temp 11/21/23 1439 97.8 F (36.6 C)     Temp Source 11/21/23 1439 Oral     SpO2 11/21/23 1439 97 %     Weight 11/21/23 1438 229 lb 15 oz (104.3 kg)     Height --      Head Circumference --      Peak Flow --      Pain Score 11/21/23 1437 6     Pain Loc --      Pain Education --      Exclude from Growth Chart --    No data found.  Updated Vital Signs BP (!) 149/66 (BP Location: Left Arm)   Pulse 85   Temp 97.8 F (36.6 C) (Oral)   Resp 18   Wt 104.3 kg   SpO2 97%   BMI 33.96 kg/m   Visual Acuity Right Eye Distance:   Left Eye Distance:   Bilateral Distance:    Right Eye Near:   Left Eye Near:    Bilateral Near:     Physical Exam Vitals reviewed.  Constitutional:      Appearance: Normal appearance.  Musculoskeletal:        General: Normal range of motion.  Skin:    Comments: 1 cm round area right upper arm, dark center.  No  streaking  Neurological:     General: No focal deficit present.     Mental Status: He is alert.      UC Treatments / Results  Labs (all labs ordered are listed, but only abnormal results are displayed) Labs Reviewed - No data to display  EKG   Radiology No results found.  Procedures Procedures (including critical care time)  Medications Ordered in UC Medications - No data to display  Initial Impression / Assessment and Plan / UC Course  I have reviewed the triage vital signs and the nursing notes.  Pertinent labs & imaging results that were available during my care of the patient were reviewed by me  and considered in my medical decision making (see chart for details).     Patient has a red wound on his right upper arm that has not been healing as quickly as he thinks that should have.  He is concerned about MRSA.  Patient is given a prescription for doxycycline  he is advised to keep follow-up with dermatologist. Final Clinical Impressions(s) / UC Diagnoses   Final diagnoses:  Skin infection   Discharge Instructions      Return if any problems.   ED Prescriptions     Medication Sig Dispense Auth. Provider   doxycycline  (VIBRAMYCIN ) 100 MG capsule Take 1 capsule (100 mg total) by mouth 2 (two) times daily. 14 capsule Hayven Fatima K, PA-C      PDMP not reviewed this encounter. An After Visit Summary was printed and given to the patient.       Sandi Crosby, PA-C 11/21/23 1733

## 2024-02-07 ENCOUNTER — Telehealth: Admitting: Physician Assistant

## 2024-02-07 ENCOUNTER — Encounter: Payer: Self-pay | Admitting: Internal Medicine

## 2024-02-07 ENCOUNTER — Telehealth: Payer: Self-pay

## 2024-02-07 DIAGNOSIS — Z20822 Contact with and (suspected) exposure to covid-19: Secondary | ICD-10-CM

## 2024-02-07 MED ORDER — NIRMATRELVIR/RITONAVIR (PAXLOVID)TABLET
3.0000 | ORAL_TABLET | Freq: Two times a day (BID) | ORAL | 0 refills | Status: AC
Start: 2024-02-07 — End: 2024-02-12

## 2024-02-07 MED ORDER — BENZONATATE 100 MG PO CAPS
100.0000 mg | ORAL_CAPSULE | Freq: Three times a day (TID) | ORAL | 0 refills | Status: AC
Start: 2024-02-07 — End: 2024-02-12

## 2024-02-07 NOTE — Telephone Encounter (Signed)
 Copied from CRM [id number]. Topic: Clinical - Medication Question >> Feb 07, 2024 12:44 PM Aisha D wrote: Reason for CRM: Pt stated that he tested for Covid and would like for Dr.John to send a prescription for paxlovid  to the pharmacy. Pt would like a callback with an update.

## 2024-02-07 NOTE — Patient Instructions (Signed)
 Harry Carroll, thank you for joining Lynden GORMAN Snuffer, PA-C for today's virtual visit.  While this provider is not your primary care provider (PCP), if your PCP is located in our provider database this encounter information will be shared with them immediately following your visit.   A [facility] MyChart account gives you access to today's visit and all your visits, tests, and labs performed at [facility]  click here if you don't have a [facility] MyChart account or go to [email or web address]  Consent: (Patient) Harry Carroll provided verbal consent for this virtual visit at the beginning of the encounter.  Current Medications:  Current Outpatient Medications:    albuterol  (VENTOLIN  HFA) 108 (90 Base) MCG/ACT inhaler, Inhale 2 puffs into the lungs every 4 (four) hours as needed for wheezing or shortness of breath., Disp: 1 each, Rfl: 0   aspirin  EC 81 MG tablet, Take 1 tablet (81 mg total) by mouth daily. Swallow whole., Disp: 30 tablet, Rfl: 12   benzonatate  (TESSALON ) 100 MG capsule, Take 1 capsule by mouth 3 (three) times daily as needed., Disp: , Rfl:    cyclobenzaprine  (FLEXERIL ) 10 MG tablet, Take 1 tablet by mouth at bedtime., Disp: , Rfl:    diphenhydrAMINE  (BENADRYL ) 25 MG tablet, Take 25-50 mg by mouth every 4 (four) hours as needed., Disp: , Rfl:    doxycycline  (VIBRAMYCIN ) 100 MG capsule, Take 1 capsule (100 mg total) by mouth 2 (two) times daily., Disp: 14 capsule, Rfl: 0   IOSAT 130 MG tablet, Take 130 mg by mouth as directed., Disp: , Rfl:    Na Sulfate-K Sulfate-Mg Sulfate concentrate (SUPREP) 17.5-3.13-1.6 GM/177ML SOLN, Take 1 kit by mouth See admin instructions., Disp: , Rfl:    oxycodone  (OXY-IR) 5 MG capsule, Take 1 capsule (5 mg total) by mouth every 4 (four) hours as needed. (Patient not taking: Reported on 03/29/2023), Disp: 30 capsule, Rfl: 0   predniSONE  (DELTASONE ) 20 MG tablet, Take 2 tablets by mouth daily with breakfast., Disp: , Rfl:     REPATHA  SURECLICK 140 MG/ML SOAJ, ADMINISTER 1 ML UNDER THE SKIN EVERY 14 DAYS, Disp: 2 mL, Rfl: 11   testosterone  cypionate (DEPOTESTOSTERONE CYPIONATE) 200 MG/ML injection, Inject 0.5 mLs (100 mg total) into the muscle every 14 (fourteen) days., Disp: 10 mL, Rfl: 5   tirzepatide  (MOUNJARO ) 7.5 MG/0.5ML Pen, Inject 7.5 mg into the skin once a week., Disp: 6 mL, Rfl: 3   tobramycin  (TOBREX ) 0.3 % ophthalmic solution, Place 2 drops into the left eye every 6 (six) hours., Disp: 5 mL, Rfl: 0   Medications ordered in this encounter:  No orders of the defined types were placed in this encounter.    *If you need refills on other medications prior to your next appointment, please contact your pharmacy*  Follow-Up: Call back or seek an in-person evaluation if the symptoms worsen or if the condition fails to improve as anticipated.  [facility] [phone]  Other Instructions Take paxlovid  as directed   Follow up with your regular doctor in 1 week for reassessment and seek care sooner if your symptoms worsen or fail to improve.   If you have been instructed to have an in-person evaluation today at a local Urgent Care facility, please use the link below. It will take you to a list of all of our available [facility], including address, phone number and hours of operation. Please do not delay care.  [facility]  If  you or a family member do not have a primary care provider, use the link below to schedule a visit and establish care. When you choose a [facility] primary care physician or advanced practice provider, you gain a long-term partner in health. Find a Primary Care Provider  Learn more about [facility]'s in-office and virtual care options: [facility] - Get Care Now

## 2024-02-07 NOTE — Progress Notes (Signed)
 Mr. nyal, schachter are scheduled for a virtual visit with your provider today.    Just as we do with appointments in the office, we must obtain your consent to participate.  Your consent will be active for this visit and any virtual visit you may have with one of our providers in the next 365 days.    If you have a MyChart account, I can also send a copy of this consent to you electronically.  All virtual visits are billed to your insurance company just like a traditional visit in the office.  As this is a virtual visit, video technology does not allow for your provider to perform a traditional examination.  This may limit your provider's ability to fully assess your condition.  If your provider identifies any concerns that need to be evaluated in person or the need to arrange testing such as labs, EKG, etc, we will make arrangements to do so.    Although advances in technology are sophisticated, we cannot ensure that it will always work on either your end or our end.  If the connection with a video visit is poor, we may have to switch to a telephone visit.  With either a video or telephone visit, we are not always able to ensure that we have a secure connection.   I need to obtain your verbal consent now.   Are you willing to proceed with your visit today?   Harry Carroll has provided verbal consent on 02/07/2024 for a virtual visit (video or telephone).   Harry GORMAN Snuffer, PA-C 02/07/2024  5:21 PM   Date:  02/07/2024   ID:  Harry Carroll, DOB November 28, 1960, MRN [id number]  Patient Location: Home Provider Location: Home Office   Participants: Patient and Provider for Visit and Wrap up  Method of visit: Video  Location of Patient: Home Location of Provider: Home Office Consent was obtain for visit over the video. Services rendered by provider: Visit was performed via video  A video enabled telemedicine application was used and I verified that I am speaking with the correct person using two  identifiers.  PCP:  Norleen Lynwood ORN, MD   Chief Complaint:  covid  History of Present Illness:    Harry Carroll is a 63 y.o. male with history as stated below. Presents video telehealth for an acute care visit  Pt reports he started feeling poorly yesterday and tested positive for covid today. He reports chest congestion, cough, headache, chills. Denies fevers.  He has taken over the counter medications without relief.   Past Medical, Surgical, Social History, Allergies, and Medications have been Reviewed.  Past Medical History:  Diagnosis Date   Arthritis    Bipolar 1 disorder (HCC)    Dr Geoffry , pt denies   Carotid stenosis    Carpal tunnel syndrome    saw Dr Enis , pt unaware   Cervical spinal stenosis    Colon polyps    adenomatous   Complication of anesthesia    trouble placing catheter with last surgeery, had to get urologist   Diverticulitis    Diverticulosis    Heart murmur    pt denies   History of hiatal hernia    resolved   Hyperlipemia    Insomnia    related to pain   Normal cardiac stress test    CP 01-2009---Nl ECHO and stress test neg   Numbness of fingers of both hands    when sleeps, right hand worse than  left   PVC (premature ventricular contraction)    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 PROVIDER.     Allergies:   Ciprofloxacin , Dilaudid  [hydromorphone  hcl], Hydromorphone , and Hydrocodone    ROS See HPI for history of present illness.  Physical Exam Constitutional:      Appearance: Normal appearance.  Pulmonary:     Effort: Pulmonary effort is normal.  Neurological:     Mental Status: He is alert.     MDM: Pt with positive covid test. Rx for paxlovid  sent   Tests Ordered: No orders of the defined types were placed in this  encounter.   Medication Changes: No orders of the defined types were placed in this encounter.    Disposition:  Follow up  Signed, Harry GORMAN Snuffer, PA-C  02/07/2024 5:21 PM

## 2024-02-08 NOTE — Telephone Encounter (Signed)
Issue has since been resolved.

## 2024-02-09 ENCOUNTER — Ambulatory Visit
Admission: RE | Admit: 2024-02-09 | Discharge: 2024-02-09 | Disposition: A | Discharge status: Home / Self Care | Source: Ambulatory Visit

## 2024-02-09 VITALS — BP 134/87 | HR 91 | Temp 97.9°F | Resp 18 | Ht 69.0 in | Wt 229.9 lb

## 2024-02-09 DIAGNOSIS — U071 COVID-19: Secondary | ICD-10-CM | POA: Diagnosis not present

## 2024-02-09 HISTORY — DX: Allergy, unspecified, initial encounter: T78.40XA

## 2024-02-09 MED ORDER — MUCINEX DM MAXIMUM STRENGTH 60-1200 MG PO TB12: 1.0000 | ORAL_TABLET | Freq: Two times a day (BID) | ORAL | 0 refills | Status: AC

## 2024-02-09 NOTE — ED Triage Notes (Signed)
 Chest x-ray follow up from covid suggested by doc - Entered by patient  I did a virtual visit on 02-07-2024, diagnosis of COVID19, on Paxlovid , because of my history recommended Chest Xray and Follow up. No fever right now.

## 2024-02-09 NOTE — Discharge Instructions (Addendum)
 You were seen today for COVID-19 infection. You tested positive two days ago, and your symptoms began earlier this week with fever, cough, and congestion. You were started on Paxlovid  and should continue taking it exactly as prescribed until the course is finished. You may also continue Tessalon  Perles every 8 hours as directed for cough relief. Mucinex  maximum strength has been added to help loosen mucus and reduce congestion; take this twice a day.   At home, drink plenty of fluids to stay hydrated and get as much rest as possible. Using a cool-mist humidifier in your bedroom at night may help ease cough and congestion. You should also replace your toothbrush once you have completed the Paxlovid  course. Mild cough and congestion may continue for a few weeks even as you recover, which is normal.  Contact your primary care provider if your cough or congestion does not improve after completing your medications, if you develop a new fever, or if you feel your breathing is getting worse. A follow-up visit may also be helpful if symptoms linger longer than expected or if you have concerns about your recovery.  Go to the emergency department immediately if you experience sudden shortness of breath, chest pain, new or worsening wheezing, confusion, dizziness, or if your oxygen level falls below 94%. Also seek emergency care if you develop a persistent fever above 100.78F despite treatment or if you feel significantly worse at any point.

## 2024-02-09 NOTE — ED Provider Notes (Signed)
 [facility]    CSN: [id number] Arrival date & time: 02/09/24  0940      History   Chief Complaint Chief Complaint  Patient presents with   Follow-up    HPI Harry Carroll is a 63 y.o. male.   Discussed the use of AI scribe software for clinical note transcription with the patient, who gave verbal consent to proceed.   Patient with a history of severe COVID-19 in 2020 presents with a positive home COVID-19 test and associated symptoms. The patient began feeling unwell on Tuesday evening following arm surgery, initially experiencing cold-like symptoms without fever. By Wednesday morning, the home COVID-19 test was positive, and the patient's fever had escalated to 104 degrees Fahrenheit.  The patient's symptoms include fever, cough, chest congestion, and rhinitis. They report no current sore throat, sneezing, or nasal congestion, though they did experience throat soreness during the worst of their symptoms. After a steamy shower, the patient coughed up a darkened substance. Notably, the patient denies shortness of breath or wheezing. They mention a history of severe COVID-19 in 2020, which nearly resulted in death and required two years to recover pulmonary health.  The patient began Paxlovid  treatment on August 27th, which has helped to settle the fever. They report feeling better than Wednesday night, though still not fully recovered. The patient denies nausea, vomiting, diarrhea, and any unusual taste from the Paxlovid  medication. They also do not report any body aches or headaches.  Given the patient's history of severe COVID-19 and prolonged pulmonary recovery, their primary care physician recommended a chest x-ray to check for pneumonia due to increased susceptibility.    Past Medical History:  Diagnosis Date   Abnormal liver function tests 06/12/2019   Allergy 2010   Ciproflaxacin and Dilaudid    Arthritis    Bipolar 1 disorder (HCC)    Dr Geoffry , pt denies    Bipolar I disorder (HCC) 11/29/2007   Carotid stenosis    Carpal tunnel syndrome    saw Dr Enis , pt unaware   Cervical spinal stenosis    Colon polyps    adenomatous   Complication of anesthesia    trouble placing catheter with last surgeery, had to get urologist   Diverticulitis    Diverticulosis    Heart murmur    pt denies   History of hiatal hernia    resolved   Hyperlipemia    Insomnia    related to pain   Normal cardiac stress test    CP 01-2009---Nl ECHO and stress test neg   Numbness of fingers of both hands    when sleeps, right hand worse than left   PVC (premature ventricular contraction)    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   Restless leg    Sleep apnea    does not use cpap due to does not like    Patient Active Problem List   Diagnosis Date Noted   Radial styloid tenosynovitis (de quervain) 09/18/2023   Radial styloid tenosynovitis of left hand 09/18/2023   Left cervical radiculopathy 11/30/2022   Vitamin D  deficiency 11/28/2022   Bilateral carotid artery stenosis 03/24/2022   Pain of right hip joint 02/17/2022   Pain in finger of right hand 05/21/2021   History of COVID-19 03/04/2020   Physical deconditioning 03/04/2020   Posterior rhinorrhea 09/29/2019   DOE (dyspnea on exertion) 09/26/2019   Alcohol use, 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   Acute respiratory failure with hypoxia (HCC)  12/21/2018   Depression 12/21/2018   COVID-19 virus infection 12/20/2018   De Quervain's tenosynovitis, right 07/31/2018   S/P knee replacement 05/18/2018   Encounter for other specified aftercare 04/23/2018   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/15/2017   Prediabetes 06/22/2017   Hyperglycemia 06/22/2017   Low testosterone  10/03/2016   Morbid obesity due to excess calories (HCC) 03/28/2016   Bilateral wrist pain 12/25/2015   Acute diverticulitis 10/21/2015   Encounter for well adult exam with abnormal findings  03/24/2015   Contusion of rib 11/13/2014   History of colonic polyps 04/02/2014   Diverticulitis of colon (without mention of hemorrhage)(562.11) 01/17/2014   Chest pain 09/04/2013   Dizziness 03/29/2012   Diverticulitis-post resection July 2013 11/27/2011   Obstructive sleep apnea syndrome 11/17/2011   Fatigue 10/11/2011   History of colonic polyps 02/18/2011   HYPERTRIGLYCERIDEMIA 02/20/2009   Bipolar disorder (HCC) 11/29/2007   Hyperlipidemia 04/16/2007    Past Surgical History:  Procedure Laterality Date   COLON SURGERY     Partial sigmoidectomy   COLONOSCOPY     COLONOSCOPY WITH PROPOFOL  N/A 04/02/2014   Procedure: COLONOSCOPY WITH PROPOFOL ;  Surgeon: Lamar JONETTA Aho, MD;  Location: [facility];  Service: Endoscopy;  Laterality: N/A;   CYSTOSCOPY WITH URETHRAL DILATATION  12/20/2011   Procedure: CYSTOSCOPY WITH URETHRAL DILATATION;  Surgeon: Krystal CHRISTELLA Spinner, MD;  Location: [facility];  Service: General;;  with foley insertion   CYSTOSCOPY WITH URETHRAL DILATATION  12/20/2011   Procedure: CYSTOSCOPY WITH URETHRAL DILATATION;  Surgeon: Garnette Shack, MD;  Location: [facility];  Service: Urology;;   CYSTOSCOPY WITH URETHRAL DILATATION N/A 12/15/2017   Procedure: CYSTOSCOPY WITH URETHRAL DILATATION;  Surgeon: Watt Rush, MD;  Location: [facility];  Service: Urology;  Laterality: N/A;   HOT HEMOSTASIS N/A 04/02/2014   Procedure: HOT HEMOSTASIS (ARGON PLASMA COAGULATION/BICAP);  Surgeon: Lamar JONETTA Aho, MD;  Location: [facility];  Service: Endoscopy;  Laterality: N/A;   JOINT REPLACEMENT  2019   Knees   maxillofacial surgery     PARTIAL COLECTOMY  12/20/2011   Procedure: PARTIAL COLECTOMY;  Surgeon: Krystal CHRISTELLA Spinner, MD;  Location: [facility];  Service: General;  Laterality: N/A;  Sigmoid colectomy   right knee arthroscopy     2007 x2   right shoulder surgery     X2   SHOULDER SURGERY     LEFT   TOTAL KNEE ARTHROPLASTY Left 12/15/2017   Procedure: LEFT TOTAL KNEE ARTHROPLASTY;  Surgeon:  Gerome Lamar, MD;  Location: [facility];  Service: Orthopedics;  Laterality: Left;  Adductor Block   TOTAL KNEE ARTHROPLASTY Right 05/18/2018   Procedure: TOTAL KNEE ARTHROPLASTY;  Surgeon: Gerome Lamar, MD;  Location: [facility];  Service: Orthopedics;  Laterality: Right;  with block       Home Medications    Prior to Admission medications   Medication Sig Start Date End Date Taking? Authorizing Provider  Dextromethorphan-guaiFENesin  (MUCINEX  DM MAXIMUM STRENGTH) 60-1200 MG TB12 Take 1 tablet by mouth 2 (two) times daily. 02/09/24  Yes Athan Casalino, FNP  mupirocin ointment (BACTROBAN) 2 % Apply 1 Application topically 2 (two) times daily. 11/23/23  Yes [provider]  nirmatrelvir /ritonavir  (PAXLOVID ) 20 x 150 MG & 10 x 100MG  TABS Take 3 tablets by mouth 2 (two) times daily for 5 days. 02/07/24 02/12/24 Yes Couture, Cortni S, PA-C  oxyCODONE  (OXY IR/ROXICODONE ) 5 MG immediate release tablet Take 5 mg by mouth every 6 (six) hours as needed. 02/06/24  Yes [provider]  REPATHA  SURECLICK 140  MG/ML SOAJ ADMINISTER 1 ML UNDER THE SKIN EVERY 14 DAYS 04/07/23  Yes Crenshaw, Redell RAMAN, MD  Testosterone  Cypionate 200 MG/ML SOSY    Yes [provider]  tirzepatide  (MOUNJARO ) 7.5 MG/0.5ML Pen Inject 7.5 mg into the skin once a week. 03/29/23  Yes Norleen Lynwood ORN, MD  albuterol  (VENTOLIN  HFA) 108 (90 Base) MCG/ACT inhaler Inhale 2 puffs into the lungs every 4 (four) hours as needed for wheezing or shortness of breath. 10/13/22   Vonna Sharlet POUR, MD  aspirin  EC 81 MG tablet Take 1 tablet (81 mg total) by mouth daily. Swallow whole. 03/28/22   Norleen Lynwood ORN, MD  benzonatate  (TESSALON ) 100 MG capsule Take 1 capsule (100 mg total) by mouth every 8 (eight) hours for 5 days. 02/07/24 02/12/24  Couture, Cortni S, PA-C  diphenhydrAMINE  (BENADRYL ) 25 MG tablet Take 25 mg by mouth every 6 (six) hours as needed.    [provider]  testosterone  cypionate (DEPOTESTOSTERONE CYPIONATE)  200 MG/ML injection Inject 0.5 mLs (100 mg total) into the muscle every 14 (fourteen) days. 08/31/23   Norleen Lynwood ORN, MD  tobramycin  (TOBREX ) 0.3 % ophthalmic solution Place 2 drops into the left eye every 6 (six) hours. 09/22/23   Darral Longs, MD    Family History Family History  Problem Relation Age of Onset   Liver disease Mother    Alcohol abuse Father    Alcohol abuse Sister    Prostate cancer Paternal Grandfather    Cancer Paternal Aunt        lymph cancer   Colon cancer Neg Hx    CAD Neg Hx    Pancreatic cancer Neg Hx    Rectal cancer Neg Hx    Stomach cancer Neg Hx    Drug abuse Neg Hx    Anxiety disorder Neg Hx    Bipolar disorder Neg Hx    Depression Neg Hx    Esophageal cancer Neg Hx     Social History Social History   Tobacco Use   Smoking status: Former    Types: Cigars    Quit date: 06/13/2018    Years since quitting: 5.6    Passive exposure: Current   Smokeless tobacco: Former    Types: Snuff    Quit date: 12/10/2017   Tobacco comments:    1 per week  Vaping Use   Vaping status: Never Used  Substance Use Topics   Alcohol use: Yes    Comment:  a couple beers a few times a week    Drug use: No    Comment: denies THC, cocainse, stimulants, pain meds, benzos, synthetic drugs     Allergies   Ciprofloxacin , Dilaudid  [hydromorphone  hcl], Hydromorphone , and Hydrocodone    Review of Systems Review of Systems  Constitutional:  Positive for fever.  HENT:  Positive for rhinorrhea (mild) and sore throat (initially but not currently). Negative for congestion.   Respiratory:  Positive for cough (mildy productive) and chest tightness. Negative for shortness of breath and wheezing.   Gastrointestinal:  Negative for diarrhea, nausea and vomiting.  Musculoskeletal:  Negative for myalgias.  Neurological:  Negative for headaches.  All other systems reviewed and are negative.    Physical Exam Triage Vital Signs ED Triage Vitals  Encounter Vitals Group     BP  02/09/24 0948 134/87     Girls Systolic BP Percentile --      Girls Diastolic BP Percentile --      Boys Systolic BP Percentile --  Boys Diastolic BP Percentile --      Pulse Rate 02/09/24 0948 91     Resp 02/09/24 0948 18     Temp 02/09/24 0948 97.9 F (36.6 C)     Temp Source 02/09/24 0948 Oral     SpO2 02/09/24 0948 96 %     Weight 02/09/24 0946 229 lb 15 oz (104.3 kg)     Height 02/09/24 0946 5' 9 (1.753 m)     Head Circumference --      Peak Flow --      Pain Score 02/09/24 0944 0     Pain Loc --      Pain Education --      Exclude from Growth Chart --    No data found.  Updated Vital Signs BP 134/87 (BP Location: Right Arm)   Pulse 91   Temp 97.9 F (36.6 C) (Oral)   Resp 18   Ht 5' 9 (1.753 m)   Wt 229 lb 15 oz (104.3 kg)   SpO2 96%   BMI 33.96 kg/m   Visual Acuity Right Eye Distance:   Left Eye Distance:   Bilateral Distance:    Right Eye Near:   Left Eye Near:    Bilateral Near:     Physical Exam Vitals reviewed.  Constitutional:      General: He is awake. He is not in acute distress.    Appearance: Normal appearance. He is well-developed. He is not ill-appearing, toxic-appearing or diaphoretic.  HENT:     Head: Normocephalic.     Right Ear: Tympanic membrane, ear canal and external ear normal. No drainage, swelling or tenderness. No middle ear effusion. Tympanic membrane is not erythematous.     Left Ear: Tympanic membrane, ear canal and external ear normal. No drainage, swelling or tenderness.  No middle ear effusion. Tympanic membrane is not erythematous.     Nose: No congestion or rhinorrhea.     Mouth/Throat:     Lips: Pink.     Mouth: Mucous membranes are moist.     Pharynx: No pharyngeal swelling, oropharyngeal exudate, posterior oropharyngeal erythema or uvula swelling.     Tonsils: No tonsillar exudate or tonsillar abscesses.  Eyes:     General: Vision grossly intact.     Conjunctiva/sclera: Conjunctivae normal.  Cardiovascular:      Rate and Rhythm: Normal rate.     Heart sounds: Normal heart sounds.  Pulmonary:     Effort: Pulmonary effort is normal. No tachypnea or respiratory distress.     Breath sounds: Normal breath sounds and air entry. No decreased breath sounds or wheezing.     Comments: Respirations even and unlabored  Musculoskeletal:        General: Normal range of motion.     Cervical back: Normal range of motion and neck supple.  Lymphadenopathy:     Cervical: No cervical adenopathy.  Skin:    General: Skin is warm and dry.  Neurological:     General: No focal deficit present.     Mental Status: He is alert and oriented to person, place, and time.  Psychiatric:        Behavior: Behavior is cooperative.      UC Treatments / Results  Labs (all labs ordered are listed, but only abnormal results are displayed) Labs Reviewed - No data to display  EKG   Radiology No results found.  Procedures Procedures (including critical care time)  Medications Ordered in UC Medications - No data to  display  Initial Impression / Assessment and Plan / UC Course  I have reviewed the triage vital signs and the nursing notes.  Pertinent labs & imaging results that were available during my care of the patient were reviewed by me and considered in my medical decision making (see chart for details).     Patient presents after testing positive for COVID-19 on a home test two days ago, with symptoms beginning Tuesday evening as cold-like complaints that progressed to fever of 104F on Wednesday. He was started on Paxlovid  on 8/27 through a virtual visit and reports improvement since then. Current symptoms include cough, chest congestion, and mild rhinitis without shortness of breath, chest tightness, or wheezing. Past history is significant for severe COVID-19 infection in 2020 with prolonged pulmonary recovery, placing him at higher risk for complications. Examination today shows stable vitals, normal oxygen  saturation, and clear lung sounds without respiratory distress. He was advised to continue Paxlovid  and Tessalon  Perles as previously prescribed, and Mucinex  was added twice daily to aid cough and congestion. Supportive care with fluids, humidifier use, and toothbrush replacement after completing Paxlovid  was discussed. He was counseled that cough may persist for weeks and to monitor for fever, worsening cough, new wheezing, chest pain, or shortness of breath. Follow up with his primary care provider is advised if symptoms fail to improve or new concerns arise, and emergency evaluation is warranted for significant respiratory distress, persistent high fever, or oxygen saturation below 94%.  Today's evaluation has revealed no signs of a dangerous process. Discussed diagnosis with patient and/or guardian. Patient and/or guardian aware of their diagnosis, possible red flag symptoms to watch out for and need for close follow up. Patient and/or guardian understands verbal and written discharge instructions. Patient and/or guardian comfortable with plan and disposition.  Patient and/or guardian has a clear mental status at this time, good insight into illness (after discussion and teaching) and has clear judgment to make decisions regarding their care  Documentation was completed with the aid of voice recognition software. Transcription may contain typographical errors. Final Clinical Impressions(s) / UC Diagnoses   Final diagnoses:  COVID-19     Discharge Instructions      You were seen today for COVID-19 infection. You tested positive two days ago, and your symptoms began earlier this week with fever, cough, and congestion. You were started on Paxlovid  and should continue taking it exactly as prescribed until the course is finished. You may also continue Tessalon  Perles every 8 hours as directed for cough relief. Mucinex  maximum strength has been added to help loosen mucus and reduce congestion; take this  twice a day.   At home, drink plenty of fluids to stay hydrated and get as much rest as possible. Using a cool-mist humidifier in your bedroom at night may help ease cough and congestion. You should also replace your toothbrush once you have completed the Paxlovid  course. Mild cough and congestion may continue for a few weeks even as you recover, which is normal.  Contact your primary care provider if your cough or congestion does not improve after completing your medications, if you develop a new fever, or if you feel your breathing is getting worse. A follow-up visit may also be helpful if symptoms linger longer than expected or if you have concerns about your recovery.  Go to the emergency department immediately if you experience sudden shortness of breath, chest pain, new or worsening wheezing, confusion, dizziness, or if your oxygen level falls below 94%.  Also seek emergency care if you develop a persistent fever above 100.38F despite treatment or if you feel significantly worse at any point.      ED Prescriptions     Medication Sig Dispense Auth. Provider   Dextromethorphan-guaiFENesin  (MUCINEX  DM MAXIMUM STRENGTH) 60-1200 MG TB12 Take 1 tablet by mouth 2 (two) times daily. 20 tablet Iola Lukes, FNP      PDMP not reviewed this encounter.   Iola Lukes, [place] 02/09/24 1024

## 2024-05-02 ENCOUNTER — Ambulatory Visit (INDEPENDENT_AMBULATORY_CARE_PROVIDER_SITE_OTHER): Admitting: Internal Medicine

## 2024-05-02 ENCOUNTER — Ambulatory Visit: Payer: Self-pay | Admitting: Internal Medicine

## 2024-05-02 ENCOUNTER — Encounter: Payer: Self-pay | Admitting: Internal Medicine

## 2024-05-02 VITALS — BP 136/80 | HR 85 | Temp 98.6°F | Ht 69.0 in | Wt 242.0 lb

## 2024-05-02 DIAGNOSIS — Z125 Encounter for screening for malignant neoplasm of prostate: Secondary | ICD-10-CM | POA: Diagnosis not present

## 2024-05-02 DIAGNOSIS — R7303 Prediabetes: Secondary | ICD-10-CM | POA: Diagnosis not present

## 2024-05-02 DIAGNOSIS — E559 Vitamin D deficiency, unspecified: Secondary | ICD-10-CM

## 2024-05-02 DIAGNOSIS — Z0000 Encounter for general adult medical examination without abnormal findings: Secondary | ICD-10-CM

## 2024-05-02 DIAGNOSIS — R7989 Other specified abnormal findings of blood chemistry: Secondary | ICD-10-CM | POA: Diagnosis not present

## 2024-05-02 DIAGNOSIS — E538 Deficiency of other specified B group vitamins: Secondary | ICD-10-CM | POA: Diagnosis not present

## 2024-05-02 DIAGNOSIS — Z23 Encounter for immunization: Secondary | ICD-10-CM

## 2024-05-02 DIAGNOSIS — Z0001 Encounter for general adult medical examination with abnormal findings: Secondary | ICD-10-CM

## 2024-05-02 DIAGNOSIS — E7800 Pure hypercholesterolemia, unspecified: Secondary | ICD-10-CM

## 2024-05-02 LAB — CBC WITH DIFFERENTIAL/PLATELET
Basophils Absolute: 0.1 K/uL (ref 0.0–0.1)
Basophils Relative: 0.7 % (ref 0.0–3.0)
Eosinophils Absolute: 0.1 K/uL (ref 0.0–0.7)
Eosinophils Relative: 1.7 % (ref 0.0–5.0)
HCT: 45.3 % (ref 39.0–52.0)
Hemoglobin: 15.9 g/dL (ref 13.0–17.0)
Lymphocytes Relative: 25.6 % (ref 12.0–46.0)
Lymphs Abs: 1.9 K/uL (ref 0.7–4.0)
MCHC: 35 g/dL (ref 30.0–36.0)
MCV: 91.4 fl (ref 78.0–100.0)
Monocytes Absolute: 0.4 K/uL (ref 0.1–1.0)
Monocytes Relative: 6 % (ref 3.0–12.0)
Neutro Abs: 4.8 K/uL (ref 1.4–7.7)
Neutrophils Relative %: 66 % (ref 43.0–77.0)
Platelets: 199 K/uL (ref 150.0–400.0)
RBC: 4.95 Mil/uL (ref 4.22–5.81)
RDW: 13.4 % (ref 11.5–15.5)
WBC: 7.3 K/uL (ref 4.0–10.5)

## 2024-05-02 LAB — LIPID PANEL
Cholesterol: 170 mg/dL (ref 0–200)
HDL: 51.9 mg/dL (ref >39.00)
LDL Cholesterol: 89 mg/dL (ref 0–99)
NonHDL: 117.9
Total CHOL/HDL Ratio: 3
Triglycerides: 145 mg/dL (ref 0.0–149.0)
VLDL: 29 mg/dL (ref 0.0–40.0)

## 2024-05-02 LAB — URINALYSIS, ROUTINE W REFLEX MICROSCOPIC
Bilirubin Urine: NEGATIVE
Hgb urine dipstick: NEGATIVE
Ketones, ur: NEGATIVE
Nitrite: NEGATIVE
Specific Gravity, Urine: 1.02 (ref 1.000–1.030)
Total Protein, Urine: NEGATIVE
Urine Glucose: NEGATIVE
Urobilinogen, UA: 0.2 (ref 0.0–1.0)
pH: 6 (ref 5.0–8.0)

## 2024-05-02 LAB — BASIC METABOLIC PANEL WITH GFR
BUN: 20 mg/dL (ref 6–23)
CO2: 27 meq/L (ref 19–32)
Calcium: 10 mg/dL (ref 8.4–10.5)
Chloride: 101 meq/L (ref 96–112)
Creatinine, Ser: 0.96 mg/dL (ref 0.40–1.50)
GFR: 83.27 mL/min (ref >60.00)
Glucose, Bld: 80 mg/dL (ref 70–99)
Potassium: 4.5 meq/L (ref 3.5–5.1)
Sodium: 136 meq/L (ref 135–145)

## 2024-05-02 LAB — VITAMIN D 25 HYDROXY (VIT D DEFICIENCY, FRACTURES): VITD: 37.3 ng/mL (ref 30.00–100.00)

## 2024-05-02 LAB — VITAMIN B12: Vitamin B-12: 279 pg/mL (ref 211–911)

## 2024-05-02 LAB — HEPATIC FUNCTION PANEL
ALT: 35 U/L (ref 0–53)
AST: 29 U/L (ref 0–37)
Albumin: 4.8 g/dL (ref 3.5–5.2)
Alkaline Phosphatase: 39 U/L (ref 39–117)
Bilirubin, Direct: 0.3 mg/dL (ref 0.0–0.3)
Total Bilirubin: 1.3 mg/dL — ABNORMAL HIGH (ref 0.2–1.2)
Total Protein: 7.2 g/dL (ref 6.0–8.3)

## 2024-05-02 LAB — HEMOGLOBIN A1C: Hgb A1c MFr Bld: 5.2 % (ref 4.6–6.5)

## 2024-05-02 LAB — PSA: PSA: 1.39 ng/mL (ref 0.10–4.00)

## 2024-05-02 LAB — TSH: TSH: 2.12 u[IU]/mL (ref 0.35–5.50)

## 2024-05-02 MED ORDER — TIRZEPATIDE 10 MG/0.5ML ~~LOC~~ SOAJ: 10.0000 mg | SUBCUTANEOUS | 3 refills | Status: DC

## 2024-05-02 MED ORDER — REPATHA SURECLICK 140 MG/ML ~~LOC~~ SOAJ: 140.0000 mg | SUBCUTANEOUS | 11 refills | Status: AC

## 2024-05-02 MED ORDER — TESTOSTERONE CYPIONATE 200 MG/ML IM SOLN: 100.0000 mg | INTRAMUSCULAR | 5 refills | Status: AC

## 2024-05-02 NOTE — Assessment & Plan Note (Signed)
 Last vitamin D  Lab Results  Component Value Date   VD25OH 37.30 05/02/2024   Low, to start oral replacement

## 2024-05-02 NOTE — Progress Notes (Signed)
 Patient ID: Harry Carroll, male   DOB: [birth year], 63 y.o.   MRN: [id number]         Chief Complaint:: wellness exam and low vit d, preDM, low testosterone , hld       HPI:  Harry Carroll is a 63 y.o. male here for wellness exam; for prevnar 20, o/w up to date               Also ran out of testosterone , needs refill. Pt denies chest pain, increased sob or doe, wheezing, orthopnea, PND, increased LE swelling, palpitations, dizziness or syncope.   Pt denies polydipsia, polyuria, or new focal neuro s/s.    Pt denies fever, wt loss, night sweats, loss of appetite, or other constitutional symptoms  Unable to lose further wt on mounjaro  7.5 mg.  Willing to start repatha , and restart testosterone .  BP has been controlled at home.  Peak wt has been in the past 285, now down with mounjaro  from the [facility] and working on diet.  .  Wt Readings from Last 3 Encounters:  05/02/24 242 lb (109.8 kg)  02/09/24 229 lb 15 oz (104.3 kg)  11/21/23 229 lb 15 oz (104.3 kg)   BP Readings from Last 3 Encounters:  05/02/24 136/80  02/09/24 134/87  11/21/23 (!) 149/66   Immunization History  Administered Date(s) Administered   Hep A / Hep B 10/31/2019, 12/09/2019, 05/11/2020   Influenza Split 03/10/2011   Influenza, Seasonal, Injecte, Preservative Fre 03/29/2023   Influenza,inj,Quad PF,6+ Mos 04/13/2013, 03/28/2016, 04/21/2017, 02/05/2019, 03/16/2020, 03/24/2022, 04/27/2024   Influenza-Unspecified 03/13/2018   PFIZER(Purple Top)SARS-COV-2 Vaccination 09/19/2019, 10/15/2019, 04/27/2020   PNEUMOCOCCAL CONJUGATE-20 05/02/2024   Pneumococcal Polysaccharide-23 11/24/2011   Td 06/14/1999, 01/29/2010   Td (Adult), 2 Lf Tetanus Toxid, Preservative Free 06/14/1999, 01/29/2010   Tdap 04/26/2019   Zoster Recombinant(Shingrix) 06/26/2019, 08/22/2019   There are no preventive care reminders to display for this patient.     Past Medical History:  Diagnosis Date   Abnormal liver function tests  06/12/2019   Allergy 2010   Ciproflaxacin and Dilaudid    Arthritis    Bipolar 1 disorder (HCC)    Dr Geoffry , pt denies   Bipolar I disorder (HCC) 11/29/2007   Carotid stenosis    Carpal tunnel syndrome    saw Dr Enis , pt unaware   Cervical spinal stenosis    Colon polyps    adenomatous   Complication of anesthesia    trouble placing catheter with last surgeery, had to get urologist   Diverticulitis    Diverticulosis    Heart murmur    pt denies   History of hiatal hernia    resolved   Hyperlipemia    Insomnia    related to pain   Normal cardiac stress test    CP 01-2009---Nl ECHO and stress test neg   Numbness of fingers of both hands    when sleeps, right hand worse than left   PVC (premature ventricular contraction)    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   Restless leg    Sleep apnea    does not use cpap due to does not like   Past Surgical History:  Procedure Laterality Date   COLON SURGERY     Partial sigmoidectomy   COLONOSCOPY     COLONOSCOPY WITH PROPOFOL  N/A 04/02/2014   Procedure: COLONOSCOPY WITH PROPOFOL ;  Surgeon: Lamar JONETTA Aho, MD;  Location: [facility];  Service: Endoscopy;  Laterality: N/A;  CYSTOSCOPY WITH URETHRAL DILATATION  12/20/2011   Procedure: CYSTOSCOPY WITH URETHRAL DILATATION;  Surgeon: Krystal CHRISTELLA Spinner, MD;  Location: [facility];  Service: General;;  with foley insertion   CYSTOSCOPY WITH URETHRAL DILATATION  12/20/2011   Procedure: CYSTOSCOPY WITH URETHRAL DILATATION;  Surgeon: Garnette Shack, MD;  Location: [facility];  Service: Urology;;   CYSTOSCOPY WITH URETHRAL DILATATION N/A 12/15/2017   Procedure: CYSTOSCOPY WITH URETHRAL DILATATION;  Surgeon: Watt Rush, MD;  Location: [facility];  Service: Urology;  Laterality: N/A;   HOT HEMOSTASIS N/A 04/02/2014   Procedure: HOT HEMOSTASIS (ARGON PLASMA COAGULATION/BICAP);  Surgeon: Lamar JONETTA Aho, MD;  Location: [facility];  Service: Endoscopy;  Laterality: N/A;   JOINT REPLACEMENT  2019    Knees   maxillofacial surgery     PARTIAL COLECTOMY  12/20/2011   Procedure: PARTIAL COLECTOMY;  Surgeon: Krystal CHRISTELLA Spinner, MD;  Location: [facility];  Service: General;  Laterality: N/A;  Sigmoid colectomy   right knee arthroscopy     2007 x2   right shoulder surgery     X2   SHOULDER SURGERY     LEFT   TOTAL KNEE ARTHROPLASTY Left 12/15/2017   Procedure: LEFT TOTAL KNEE ARTHROPLASTY;  Surgeon: Gerome Lamar, MD;  Location: [facility];  Service: Orthopedics;  Laterality: Left;  Adductor Block   TOTAL KNEE ARTHROPLASTY Right 05/18/2018   Procedure: TOTAL KNEE ARTHROPLASTY;  Surgeon: Gerome Lamar, MD;  Location: [facility];  Service: Orthopedics;  Laterality: Right;  with block    reports that he quit smoking about 5 years ago. His smoking use included cigars. He has been exposed to tobacco smoke. He quit smokeless tobacco use about 6 years ago.  His smokeless tobacco use included snuff. He reports current alcohol use. He reports that he does not use drugs. family history includes Alcohol abuse in his father and sister; Cancer in his paternal aunt; Liver disease in his mother; Prostate cancer in his paternal grandfather. Allergies  Allergen Reactions   Ciprofloxacin  Nausea And Vomiting, Nausea Only, Other (See Comments), Rash, Shortness Of Breath and Dermatitis    Marked mucous production  ciprofloxacin    Dilaudid  [Hydromorphone  Hcl] Shortness Of Breath, Nausea Only and Other (See Comments)    Patient developed rash, excessive mucous production and chest pain - tolerates morphine    Hydromorphone  Shortness Of Breath and Other (See Comments)    Other Reaction(s): other, respiratory distress  hydromorphone    Hydrocodone  Itching   Current Outpatient Medications on File Prior to Visit  Medication Sig Dispense Refill   aspirin  EC 81 MG tablet Take 1 tablet (81 mg total) by mouth daily. Swallow whole. 30 tablet 12   albuterol  (VENTOLIN  HFA) 108 (90 Base) MCG/ACT inhaler Inhale 2 puffs into the lungs  every 4 (four) hours as needed for wheezing or shortness of breath. 1 each 0   Dextromethorphan-guaiFENesin  (MUCINEX  DM MAXIMUM STRENGTH) 60-1200 MG TB12 Take 1 tablet by mouth 2 (two) times daily. 20 tablet 0   diphenhydrAMINE  (BENADRYL ) 25 MG tablet Take 25 mg by mouth every 6 (six) hours as needed.     mupirocin ointment (BACTROBAN) 2 % Apply 1 Application topically 2 (two) times daily.     oxyCODONE  (OXY IR/ROXICODONE ) 5 MG immediate release tablet Take 5 mg by mouth every 6 (six) hours as needed.     No current facility-administered medications on file prior to visit.        ROS:  All others reviewed and negative.  Objective  PE:  BP 136/80 (BP Location: Right Arm, Patient Position: Sitting, Cuff Size: Normal)   Pulse 85   Temp 98.6 F (37 C) (Oral)   Ht 5' 9 (1.753 m)   Wt 242 lb (109.8 kg)   SpO2 98%   BMI 35.74 kg/m                 Constitutional: Pt appears in NAD               HENT: Head: NCAT.                Right Ear: External ear normal.                 Left Ear: External ear normal.                Eyes: . Pupils are equal, round, and reactive to light. Conjunctivae and EOM are normal               Nose: without d/c or deformity               Neck: Neck supple. Gross normal ROM               Cardiovascular: Normal rate and regular rhythm.                 Pulmonary/Chest: Effort normal and breath sounds without rales or wheezing.                Abd:  Soft, NT, ND, + BS, no organomegaly               Neurological: Pt is alert. At baseline orientation, motor grossly intact               Skin: Skin is warm. No rashes, no other new lesions, LE edema - none               Psychiatric: Pt behavior is normal without agitation   Micro: none  Cardiac tracings I have personally interpreted today:  none  Pertinent Radiological findings (summarize): none   Lab Results  Component Value Date   WBC 7.3 05/02/2024   HGB 15.9 05/02/2024   HCT 45.3 05/02/2024   PLT  199.0 05/02/2024   GLUCOSE 80 05/02/2024   CHOL 170 05/02/2024   TRIG 145.0 05/02/2024   HDL 51.90 05/02/2024   LDLDIRECT 61.0 11/28/2022   LDLCALC 89 05/02/2024   ALT 35 05/02/2024   AST 29 05/02/2024   NA 136 05/02/2024   K 4.5 05/02/2024   CL 101 05/02/2024   CREATININE 0.96 05/02/2024   BUN 20 05/02/2024   CO2 27 05/02/2024   TSH 2.12 05/02/2024   PSA 1.39 05/02/2024   INR 1.0 08/18/2021   HGBA1C 5.2 05/02/2024   Assessment/Plan:  JOVIN FESTER is a 63 y.o. White or Caucasian [1] male with  has a past medical history of Abnormal liver function tests (06/12/2019), Allergy (2010), Arthritis, Bipolar 1 disorder (HCC), Bipolar I disorder (HCC) (11/29/2007), Carotid stenosis, Carpal tunnel syndrome, Cervical spinal stenosis, Colon polyps, Complication of anesthesia, Diverticulitis, Diverticulosis, Heart murmur, History of hiatal hernia, Hyperlipemia, Insomnia, Normal cardiac stress test, Numbness of fingers of both hands, PVC (premature ventricular contraction), Restless leg, and Sleep apnea.  Encounter for well adult exam with abnormal findings Age and sex appropriate education and counseling updated with regular exercise and diet Referrals for preventative services - none needed Immunizations addressed - for prevnar 20 Smoking counseling  -  none needed Evidence for depression or other mood disorder - none significant Most recent labs reviewed. I have personally reviewed and have noted: 1) the patient's medical and social history 2) The patient's current medications and supplements 3) The patient's height, weight, and BMI have been recorded in the chart   Vitamin D  deficiency Last vitamin D  Lab Results  Component Value Date   VD25OH 37.30 05/02/2024   Low, to start oral replacement   Prediabetes Lab Results  Component Value Date   HGBA1C 5.2 05/02/2024   With uncontrolled obesity, pt to increase the mounjaro  10 mg weekly   Low testosterone  For restart  testosterone , f/u level next visit  Hyperlipidemia Lab Results  Component Value Date   LDLCALC 89 05/02/2024   uncontrolled, pt to start repatha  140 mg biweekly  Followup: Return in about 6 months (around 10/30/2024).  Lynwood Rush, MD 05/02/2024 9:11 PM [facility]

## 2024-05-02 NOTE — Progress Notes (Signed)
 The test results show that your current treatment is OK, as the tests are stable.  Please continue the same plan.  There is no other need for change of treatment or further evaluation based on these results, at this time.  thanks

## 2024-05-02 NOTE — Patient Instructions (Signed)
 You had the Prevnar 20 pneumonia shot today  Ok to increase the mounjaro  to 10 mg weekly  Please continue all other medications as before, and refills have been done for the repatha   Please have the pharmacy call with any other refills you may need.  Please continue your efforts at being more active, low cholesterol diet, and weight control.  You are otherwise up to date with prevention measures today.  Please keep your appointments with your specialists as you may have planned  Please go to the LAB at the blood drawing area for the tests to be done  You will be contacted by phone if any changes need to be made immediately.  Otherwise, you will receive a letter about your results with an explanation, but please check with MyChart first.  Please make an Appointment to return in 6 months, or sooner if needed

## 2024-05-02 NOTE — Assessment & Plan Note (Signed)
 For restart testosterone , f/u level next visit

## 2024-05-02 NOTE — Assessment & Plan Note (Signed)
 Lab Results  Component Value Date   HGBA1C 5.2 05/02/2024   With uncontrolled obesity, pt to increase the mounjaro  10 mg weekly

## 2024-05-02 NOTE — Assessment & Plan Note (Addendum)
 Age and sex appropriate education and counseling updated with regular exercise and diet Referrals for preventative services - none needed Immunizations addressed - for prevnar 20 Smoking counseling  - none needed Evidence for depression or other mood disorder - none significant Most recent labs reviewed. I have personally reviewed and have noted: 1) the patient's medical and social history 2) The patient's current medications and supplements 3) The patient's height, weight, and BMI have been recorded in the chart

## 2024-05-02 NOTE — Assessment & Plan Note (Signed)
 Lab Results  Component Value Date   LDLCALC 89 05/02/2024   uncontrolled, pt to start repatha  140 mg biweekly

## 2024-05-07 ENCOUNTER — Other Ambulatory Visit: Payer: Self-pay

## 2024-05-07 ENCOUNTER — Encounter: Payer: Self-pay | Admitting: Internal Medicine

## 2024-05-07 MED ORDER — TIRZEPATIDE 10 MG/0.5ML ~~LOC~~ SOAJ: 10.0000 mg | SUBCUTANEOUS | 3 refills | Status: AC

## 2024-05-15 ENCOUNTER — Encounter: Payer: Self-pay | Admitting: Internal Medicine

## 2024-06-22 ENCOUNTER — Encounter: Payer: Self-pay | Admitting: Cardiology

## 2024-08-01 ENCOUNTER — Ambulatory Visit: Admitting: Cardiology

## 2024-10-31 ENCOUNTER — Ambulatory Visit: Admitting: Nurse Practitioner
